# Patient Record
Sex: Female | Born: 1940 | Race: White | Hispanic: No | Smoking: Never smoker
Health system: Southern US, Community
[De-identification: ages and names within clinical notes are randomized; demographics above are authoritative.]

## PROBLEM LIST (undated history)

## (undated) DIAGNOSIS — M1990 Unspecified osteoarthritis, unspecified site: Secondary | ICD-10-CM

## (undated) DIAGNOSIS — D649 Anemia, unspecified: Secondary | ICD-10-CM

## (undated) DIAGNOSIS — I10 Essential (primary) hypertension: Secondary | ICD-10-CM

## (undated) HISTORY — PX: APPENDECTOMY: SHX54

## (undated) HISTORY — DX: Essential (primary) hypertension: I10

## (undated) HISTORY — PX: TONSILLECTOMY: SUR1361

## (undated) HISTORY — DX: Anemia, unspecified: D64.9

---

## 2007-01-02 ENCOUNTER — Ambulatory Visit: Payer: Self-pay | Admitting: Family Medicine

## 2008-09-15 ENCOUNTER — Ambulatory Visit: Payer: Self-pay | Admitting: Family Medicine

## 2008-09-28 ENCOUNTER — Ambulatory Visit: Payer: Self-pay | Admitting: Family Medicine

## 2012-08-20 HISTORY — PX: JOINT REPLACEMENT: SHX530

## 2013-06-17 ENCOUNTER — Ambulatory Visit: Payer: Self-pay | Admitting: General Practice

## 2013-06-17 LAB — BASIC METABOLIC PANEL
Anion Gap: 3 — ABNORMAL LOW (ref 7–16)
Calcium, Total: 9.2 mg/dL (ref 8.5–10.1)
Co2: 30 mmol/L (ref 21–32)
Creatinine: 0.85 mg/dL (ref 0.60–1.30)
EGFR (Non-African Amer.): >60
Glucose: 90 mg/dL (ref 65–99)
Potassium: 3.8 mmol/L (ref 3.5–5.1)
Sodium: 137 mmol/L (ref 136–145)

## 2013-06-17 LAB — URINALYSIS, COMPLETE
Blood: NEGATIVE
Glucose,UR: NEGATIVE mg/dL (ref 0–75)
Nitrite: NEGATIVE
Protein: NEGATIVE
Specific Gravity: 1.005 (ref 1.003–1.030)
Squamous Epithelial: <1
WBC UR: 12 /HPF (ref 0–5)

## 2013-06-17 LAB — PROTIME-INR
INR: 0.9
Prothrombin Time: 12.5 secs (ref 11.5–14.7)

## 2013-06-17 LAB — CBC
HCT: 39.5 % (ref 35.0–47.0)
MCV: 93 fL (ref 80–100)
Platelet: 209 10*3/uL (ref 150–440)
RBC: 4.25 10*6/uL (ref 3.80–5.20)
RDW: 14.5 % (ref 11.5–14.5)

## 2013-06-17 LAB — MRSA PCR SCREENING

## 2013-06-17 LAB — SEDIMENTATION RATE: Erythrocyte Sed Rate: 8 mm/hr (ref 0–30)

## 2013-06-19 LAB — URINE CULTURE

## 2013-07-01 ENCOUNTER — Inpatient Hospital Stay: Payer: Self-pay | Admitting: General Practice

## 2013-07-02 LAB — BASIC METABOLIC PANEL
BUN: 11 mg/dL (ref 7–18)
EGFR (African American): >60
EGFR (Non-African Amer.): >60
Glucose: 128 mg/dL — ABNORMAL HIGH (ref 65–99)
Osmolality: 275 (ref 275–301)
Sodium: 137 mmol/L (ref 136–145)

## 2013-07-02 LAB — HEMOGLOBIN: HGB: 9.9 g/dL — ABNORMAL LOW (ref 12.0–16.0)

## 2013-07-02 LAB — PLATELET COUNT: Platelet: 183 10*3/uL (ref 150–440)

## 2013-07-03 LAB — BASIC METABOLIC PANEL
Anion Gap: 9 (ref 7–16)
Calcium, Total: 8.2 mg/dL — ABNORMAL LOW (ref 8.5–10.1)
Chloride: 104 mmol/L (ref 98–107)
Co2: 24 mmol/L (ref 21–32)
EGFR (African American): >60
Osmolality: 272 (ref 275–301)

## 2013-07-04 DIAGNOSIS — Z96649 Presence of unspecified artificial hip joint: Secondary | ICD-10-CM | POA: Insufficient documentation

## 2013-07-06 LAB — PATHOLOGY REPORT

## 2014-12-10 NOTE — Discharge Summary (Signed)
PATIENT NAME:  [name], [name] MR#:  [id number] DATE OF BIRTH:  12/13/1940  DATE OF ADMISSION:  07/01/2013 DATE OF DISCHARGE:  07/04/2013   ADMITTING DIAGNOSIS: Degenerative arthrosis of the right hip.   DISCHARGE DIAGNOSIS: Degenerative arthrosis of the right hip.   OPERATION: On 07/01/2013, the patient had a right total hip arthroplasty.   SURGEON: Illene Labrador. Angie Fava., MD  ASSISTANT: Van Clines, PA  ANESTHESIA: Spinal.   ESTIMATED BLOOD LOSS: 150 mL.   DRAINS: Two medium drains to Hemovac reservoir.   IMPLANTS USED: DePuy 13.5 mm large stature AML femoral component, 56 mm outer diameter Pinnacle Gription Sector acetabular shell, 6.5 mm x 20 mm Pinnacle cancellous bone screw, +4 mm 10 degree Pinnacle Marathon polyethylene liner, 36 mm outer diameter M-Spec femoral head with +5 mm neck length. The patient was stabilized, brought to the recovery room and then brought down to the orthopedic floor.   HISTORY: The patient is a 74 year old female who presented for an upcoming total hip replacement. The patient has a long history of refractory to conservative management for hip arthritis. The patient has tried anti-inflammatories and activity modification.   PHYSICAL EXAMINATION:  GENERAL: Alert female with an antalgic gait and abductor lurch.  LUNGS: Clear to auscultation.  CARDIOVASCULAR: Regular rate and rhythm.  MUSCULOSKELETAL: In regard to the right lower extremity, the patient has positive pelvic tilt. The patient has full extension to 90 degrees flexion with 10 degrees adduction and 20 degrees abduction. The patient has 30 degrees external rotation. The patient has pain with rotation and compression.   HOSPITAL COURSE: After initial admission on 07/01/2013, the patient was brought to the orthopedic floor. On postoperative day 1, the patient had a hemoglobin of 9.9, which went down to 9.5 on postoperative day 2. The patient remained stable there. The patient did physical therapy,  ambulating 75 feet and doing well. The patient was having some pain, but was tolerating therapy and is ready to go to rehab on 07/04/2013.   CONDITION AT DISCHARGE: Stable.   DISPOSITION: The patient was sent to rehab.   DISCHARGE INSTRUCTIONS:  1. The patient will follow up with [facility] on 08/11/2013 with Dr. Ernest Pine. 2. The patient is weight bear as tolerated.  3. The patient will elevate her leg with 1 to 2 pillows and use knee-high TED hose on both legs, to be removed 1 hour every 8-hour shift.  4. The patient will keep her heels off the bed and be encouraged to do cough and deep breathing.  5. The patient is to use regular diet.  6. The patient will not get her dressing dirty, try to keep it clean. The patient will have a dressing change as needed.  7. The patient will have her staples removed in 2 weeks in rehab.  8. The patient will call the clinic if there is any bright red bleeding, calf pain, bowel or bladder difficulty or have rehab do this.   DISCHARGE MEDICATIONS:  1. Tylenol 500 mg 1 tablet t.i.d.  2. Pravastatin 0.5 tablet daily.  3. Celebrex 200 mg 1 tablet daily.  4. Oxycodone 5 mg 1 tablet q.4 hours.  5. Tramadol 50 mg 1 tablet q.4 hours as needed for less severe pain.  6. Milk of Magnesia 30 mL 2 times a day.  7. Lovenox 40 mg subcutaneous once a day for 14 days.  8. Ranitidine 150 mg p.o. daily.  9. Bisacodyl 10 mg per rectally 1 suppository daily for constipation p.r.n.  10. Pantoprazole 40 mg 1 tablet b.i.d.    ____________________________ J. Dedra Skeens, [place] jtm:lb D: 07/03/2013 07:34:00 ET T: 07/03/2013 07:49:40 ET JOB#: [id number]  cc: J. Dedra Skeens, [place], <Dictator> J Shaketa Serafin [facility] PA ELECTRONICALLY SIGNED 07/05/2013 6:30

## 2014-12-10 NOTE — Op Note (Signed)
PATIENT NAME:  [name] MR#:  [id number] DATE OF BIRTH:  1940-08-25  DATE OF PROCEDURE:  07/01/2013   PREOPERATIVE DIAGNOSIS: Degenerative arthrosis of the right hip.   POSTOPERATIVE DIAGNOSIS: Degenerative arthrosis of the right knee.   PROCEDURE PERFORMED: Right total hip arthroplasty.   SURGEON: Francesco Sor, M.D.   ASSISTANT:  Van Clines, PA (required to maintain retraction throughout the procedure).   ANESTHESIA: Spinal.   ESTIMATED BLOOD LOSS: 150 mL.   FLUIDS REPLACED: 2100 mL of crystalloid.   DRAINS: Two medium drains to Hemovac reservoir.   IMPLANTS UTILIZED: DePuy 13.5 mm large stature AML femoral component, 56 mm outer diameter Pinnacle Gription Sector acetabular shell, a 6.5 mm x 20 mm Pinnacle cancellous bone screw, a +4 mm 10-degree Pinnacle Marathon polyethylene liner, and a 36 mm outer diameter M-Spec femoral head with a +5 mm neck length.   INDICATIONS FOR SURGERY: The patient is a 74 year old female who has been seen for complaints of progressive right hip and groin pain. X-rays demonstrated severe degenerative changes with full-thickness loss of articular cartilage. After discussion of the risks and benefits of the surgical intervention, the patient expressed understanding of the risks and benefits, and agreed with plans for surgical intervention.   PROCEDURE IN DETAIL: The patient was brought into the operating room and after adequate spinal anesthesia was achieved, the patient was placed in a left lateral decubitus position. Axillary roll was placed and all bony prominences were well padded. The patient's right hip and leg were cleaned and prepped with alcohol and DuraPrep, draped in the usual sterile fashion. A "timeout" was performed as per usual protocol. A lateral curvilinear incision was made gently curving towards the posterior superior iliac spine. IT band was incised in line with the skin incision, and the fibers of the gluteus maximus were split in line.  The piriformis tendon was identified, skeletonized, and incised at its insertion on the proximal femur and reflected posteriorly. In a similar fashion, the short external rotators were incised and reflected posteriorly. A T-type posterior capsulotomy was performed. Prior to dislocation of the femoral head, a threaded Steinmann pin was inserted through a separate stab incision into the pelvis superior to the acetabulum and then bent in the form of a stylus so as to assess limb length and hip offset throughout the procedure. The femoral head was then dislocated posteriorly. Inspection of the femoral head demonstrated severe degenerative changes with full-thickness loss of articular cartilage. Prominent osteophytes were debrided and synovial hypertrophy was also debrided from along the femoral neck. The femoral neck cut was performed using an oscillating saw. The anterior capsule was elevated off the femoral neck. Attention was then directed to the acetabulum. Severe degenerative changes were noted with slight defect noted to the posterior superior portion. The labrum was excised using electrocautery. The acetabulum was reamed in a sequential fashion up to a 55 mm diameter. This allowed for good punctate bleeding bone. A 56 mm outer diameter Pinnacle Gription Sector acetabular component was positioned and impacted in place. Good scratch fit was appreciated. It was elected to augment fixation with a cancellous screw. A 6.5 mm x 20 mm cancellous screw was inserted with good purchase noted. A +4 neutral polyethylene trial was placed and attention was then directed to the proximal femur. Pilot hole for reaming of the proximal femoral canal was prepared using a high-speed bur. Proximal femoral canal was reamed in a sequential fashion up to a 13 mm diameter. This allowed for approximately 5.5  to 6 cm of scratch fit. The proximal portion of the femur was then prepared using a 13.5 mm aggressive side-biting reamer. Serial  broaches were inserted up to a 13.5 mm large stature component. The calcar region was planed and trial reduction was performed using first a 1.5 and subsequently +5 mm neck length. Reasonably good stability was noted. The polyethylene trial was replaced with a +4 mm 10-degree lip with the high side located at approximately the 8 o'clock position. This allowed for additional posterior stability. Trial components were removed. The acetabular shell was irrigated and suctioned dry. A +4 mm 10-degree Pinnacle Marathon polyethylene liner was positioned and impacted into place. Next, a 13.5 mm large stature AML femoral component was positioned and impacted into place. Excellent scratch fit was appreciated. Trial reduction was again performed with a 36 mm hip ball with a +5 mm neck length. Again, excellent equalization of limb lengths and reproduction of hip offset was noted, as well as excellent stability both anteriorly and posteriorly. Trial hip ball was removed. The Morse taper was cleaned and dried. A 36 mm outer diameter M-Spec femoral head with a +5 mm neck length was placed on the trunnion and impacted into place. The hip was reduced and placed through a range of motion. Excellent stability was appreciated both anteriorly and posteriorly with good equalization of limb lengths and hip offset.   The hip was irrigated with copious amounts of normal saline with antibiotic solution using pulsatile lavage and then suctioned dry. Good hemostasis was appreciated. The posterior capsulotomy was repaired using #5 Ethibond. Piriformis tendon was reapproximated on the undersurface of the gluteus medius tendon using #5 Ethibond. Two medium drains were placed in the wound bed and brought out through a separate stab incision to be attached to a Hemovac reservoir. The IT band was repaired using interrupted sutures of #1 Vicryl. The subcutaneous tissue was approximated in layers using first #0 Vicryl, followed by 2-0 Vicryl. Skin  was closed with skin staples. A sterile dressing was applied.   The patient tolerated the procedure well. She was transported to the recovery room in stable condition.     ____________________________ Illene Labrador. Angie Fava., MD jph:dmm D: 07/01/2013 20:26:19 ET T: 07/01/2013 20:43:56 ET JOB#: [id number]  cc: Illene Labrador. Angie Fava., MD, <Dictator> Illene Labrador Angie Fava MD ELECTRONICALLY SIGNED 07/03/2013 15:25

## 2015-05-03 DIAGNOSIS — E785 Hyperlipidemia, unspecified: Secondary | ICD-10-CM | POA: Insufficient documentation

## 2015-05-15 IMAGING — CR RIGHT HIP - COMPLETE 2+ VIEW
1 series · 2 of 2 positions shown · non-contrast
Comparison: None.

CLINICAL DATA: Status post right hip replacement

EXAM:
RIGHT HIP - COMPLETE 2+ VIEW

[Series 1: ap · 0.17mm/px · 2 of 2 slices shown]
[im 1/2]
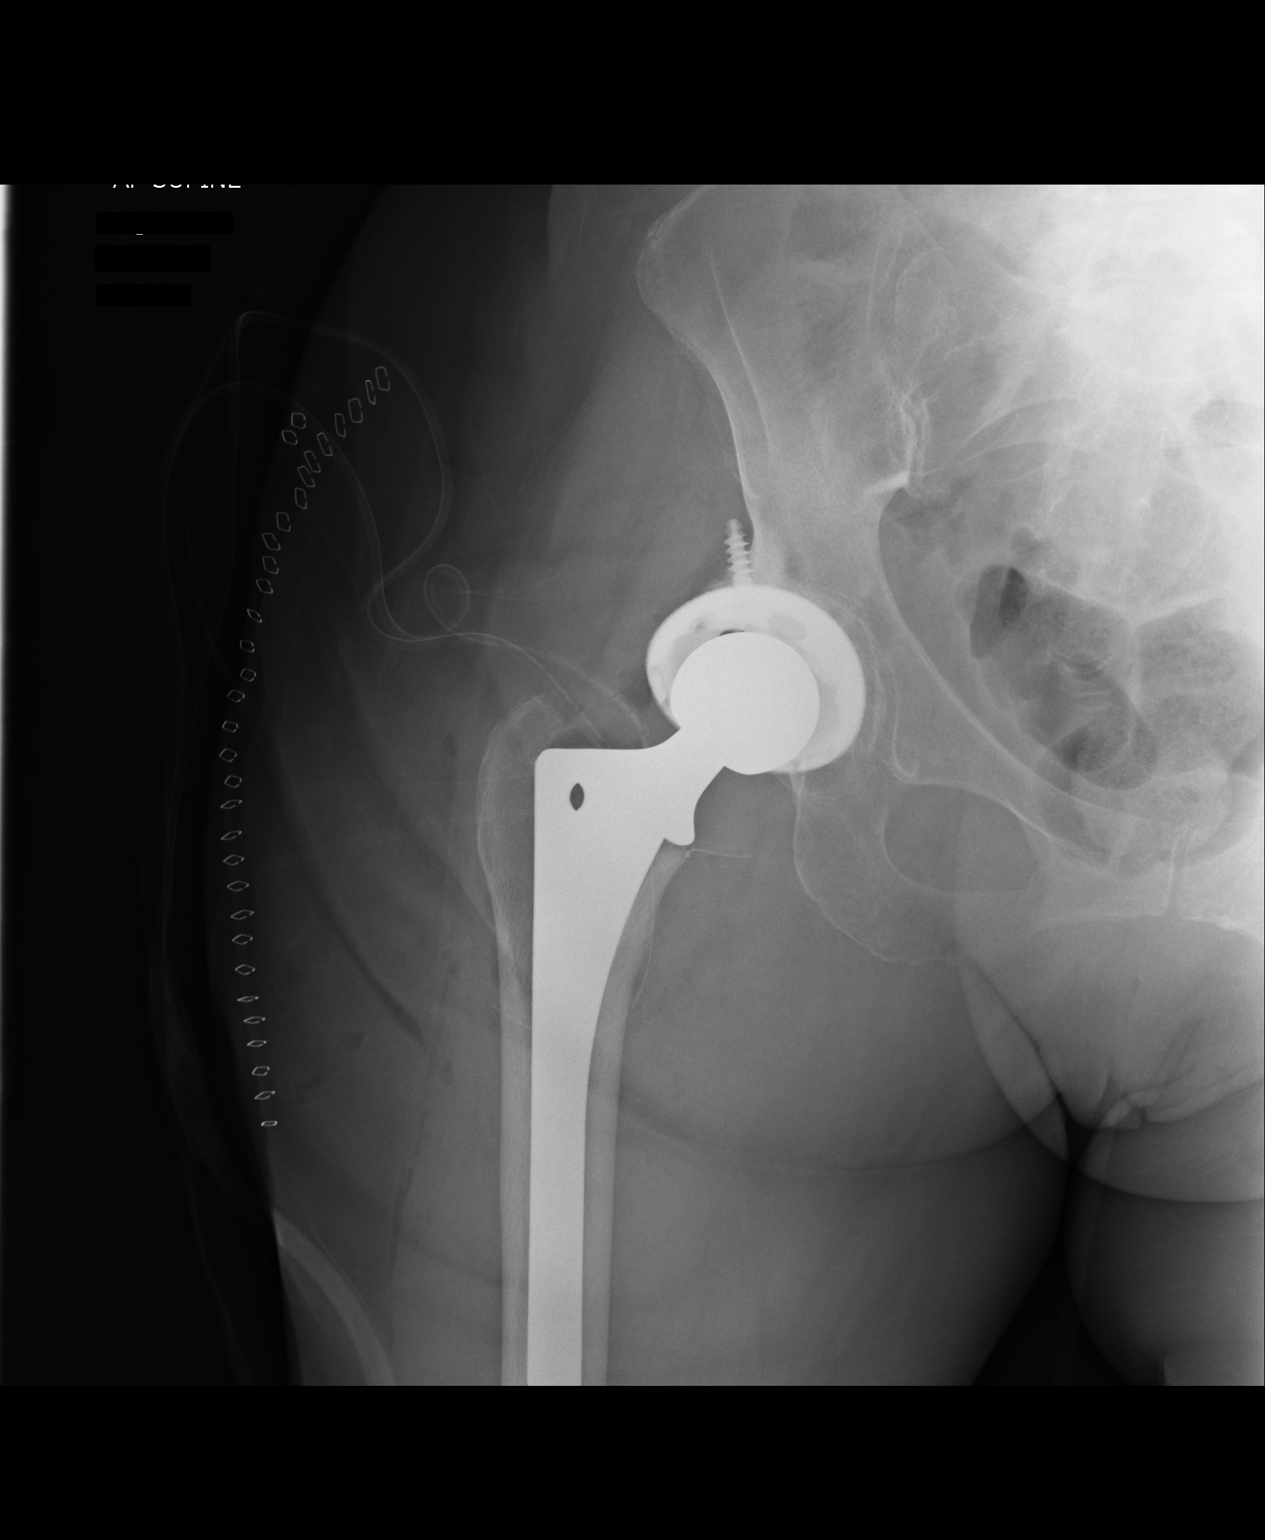
[im 2/2]
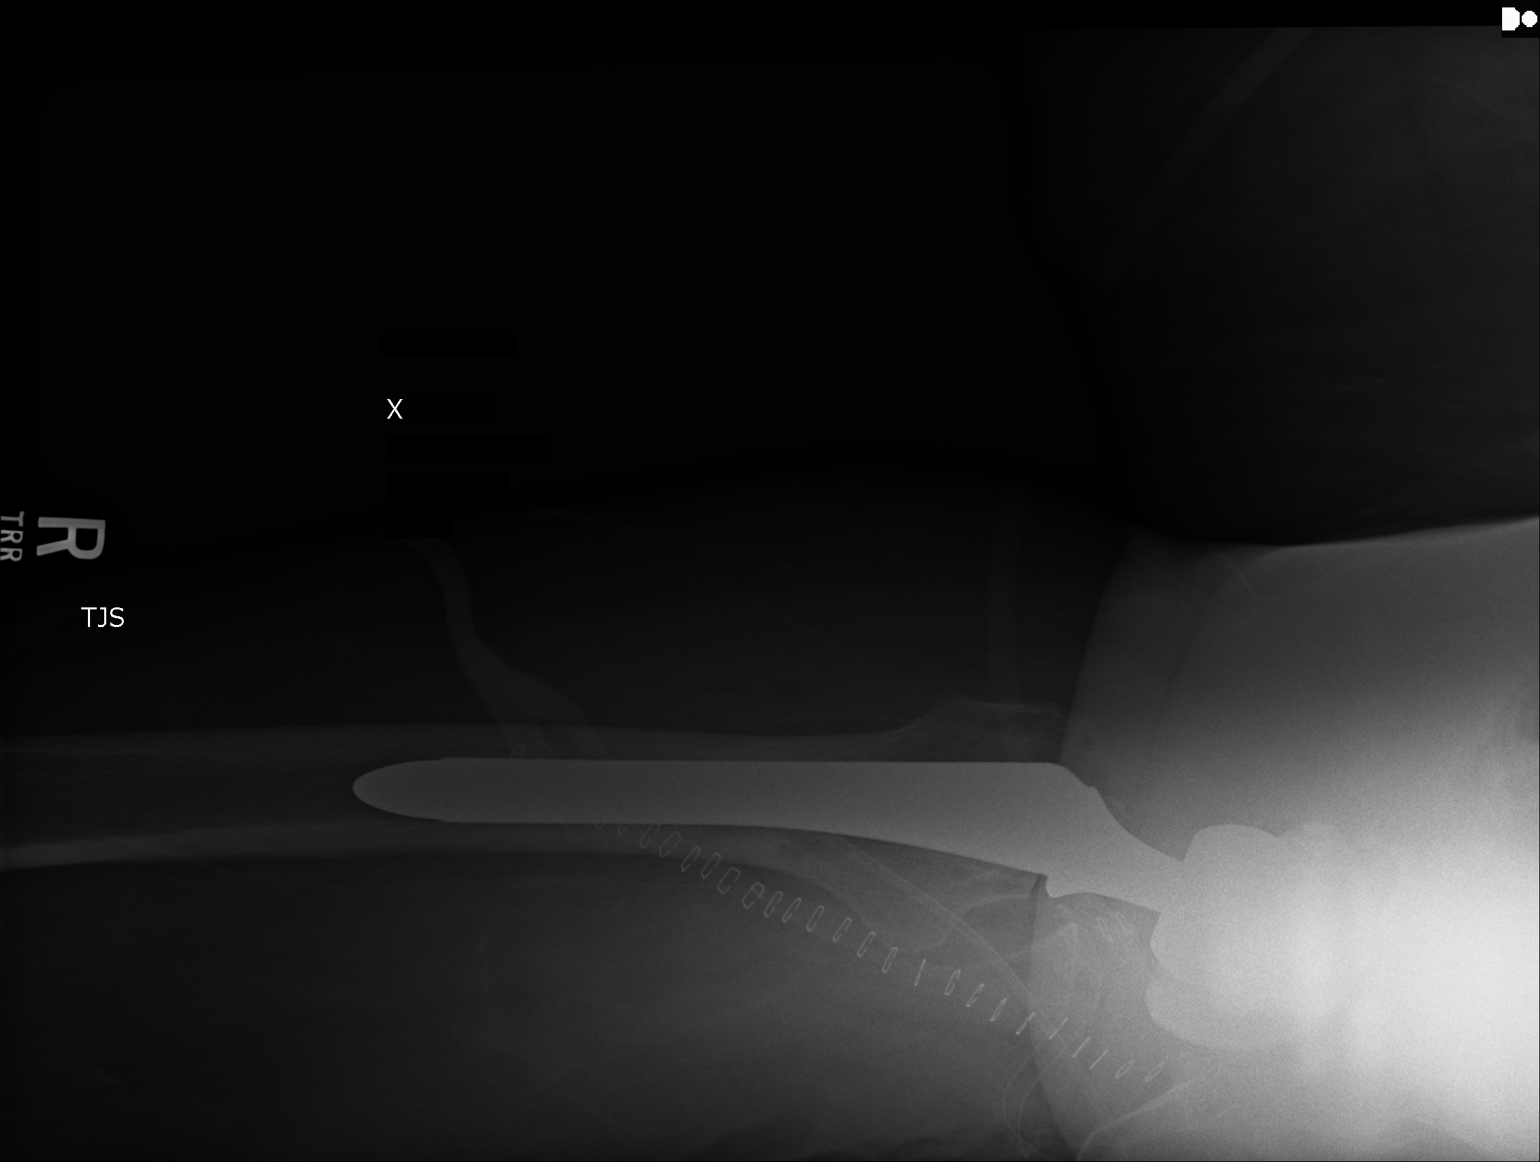

[2 of 2 positions shown; findings below may reference images not displayed]

FINDINGS: A right hip prosthesis is now seen. A surgical drain is noted in
place. The fixation screw to the acetabular component appears to
course somewhat laterally to the iliac bone. Clinical correlation is
recommended. No other focal abnormality is seen.

## 2015-06-13 DIAGNOSIS — M7062 Trochanteric bursitis, left hip: Secondary | ICD-10-CM | POA: Insufficient documentation

## 2015-08-18 ENCOUNTER — Encounter
Admission: RE | Admit: 2015-08-18 | Discharge: 2015-08-18 | Disposition: A | Discharge status: Home / Self Care | Payer: Commercial Managed Care - HMO | Source: Ambulatory Visit | Attending: Orthopedic Surgery | Admitting: Orthopedic Surgery

## 2015-08-18 DIAGNOSIS — Z01812 Encounter for preprocedural laboratory examination: Secondary | ICD-10-CM | POA: Diagnosis not present

## 2015-08-18 DIAGNOSIS — Z01810 Encounter for preprocedural cardiovascular examination: Secondary | ICD-10-CM | POA: Diagnosis present

## 2015-08-18 HISTORY — DX: Unspecified osteoarthritis, unspecified site: M19.90

## 2015-08-18 LAB — BASIC METABOLIC PANEL
Anion gap: 10 (ref 5–15)
BUN: 11 mg/dL (ref 6–20)
CHLORIDE: 101 mmol/L (ref 101–111)
CO2: 26 mmol/L (ref 22–32)
CREATININE: 0.61 mg/dL (ref 0.44–1.00)
Calcium: 9.1 mg/dL (ref 8.9–10.3)
GFR calc Af Amer: >60 mL/min (ref >60)
GFR calc non Af Amer: >60 mL/min (ref >60)
Glucose, Bld: 96 mg/dL (ref 65–99)
Potassium: 4.1 mmol/L (ref 3.5–5.1)
SODIUM: 137 mmol/L (ref 135–145)

## 2015-08-18 LAB — PROTIME-INR
INR: 1.09
PROTHROMBIN TIME: 14.3 s (ref 11.4–15.0)

## 2015-08-18 LAB — URINALYSIS COMPLETE WITH MICROSCOPIC (ARMC ONLY)
BILIRUBIN URINE: NEGATIVE
Bacteria, UA: NONE SEEN
Glucose, UA: NEGATIVE mg/dL
HGB URINE DIPSTICK: NEGATIVE
LEUKOCYTES UA: NEGATIVE
NITRITE: NEGATIVE
PH: 5 (ref 5.0–8.0)
Protein, ur: NEGATIVE mg/dL
Specific Gravity, Urine: 1.013 (ref 1.005–1.030)

## 2015-08-18 LAB — CBC
HCT: 32.3 % — ABNORMAL LOW (ref 35.0–47.0)
HEMOGLOBIN: 10.3 g/dL — AB (ref 12.0–16.0)
MCH: 23.5 pg — AB (ref 26.0–34.0)
MCHC: 31.8 g/dL — AB (ref 32.0–36.0)
MCV: 74.1 fL — ABNORMAL LOW (ref 80.0–100.0)
Platelets: 437 10*3/uL (ref 150–440)
RBC: 4.36 MIL/uL (ref 3.80–5.20)
RDW: 17 % — ABNORMAL HIGH (ref 11.5–14.5)
WBC: 6.8 10*3/uL (ref 3.6–11.0)

## 2015-08-18 LAB — SURGICAL PCR SCREEN
MRSA, PCR: NEGATIVE
STAPHYLOCOCCUS AUREUS: NEGATIVE

## 2015-08-18 LAB — APTT: aPTT: 32 seconds (ref 24–36)

## 2015-08-18 LAB — TYPE AND SCREEN
ABO/RH(D): O POS
ANTIBODY SCREEN: NEGATIVE

## 2015-08-18 LAB — ABO/RH: ABO/RH(D): O POS

## 2015-08-18 LAB — SEDIMENTATION RATE: Sed Rate: 89 mm/hr — ABNORMAL HIGH (ref 0–30)

## 2015-08-18 NOTE — Patient Instructions (Addendum)
  Your procedure is scheduled on: Wednesday 08/31/2015 Report to Day Surgery. 2ND FLOOR MEDICAL MALL ENTRANCE To find out your arrival time please call [phone] between 1PM - 3PM on Tuesday 08/30/2015.  Remember: Instructions that are not followed completely may result in serious medical risk, up to and including death, or upon the discretion of your surgeon and anesthesiologist your surgery may need to be rescheduled.    __X__ 1. Do not eat food or drink liquids after midnight. No gum chewing or hard candies.     __X__ 2. No Alcohol for 24 hours before or after surgery.   ____ 3. Bring all medications with you on the day of surgery if instructed.    __X__ 4. Notify your doctor if there is any change in your medical condition     (cold, fever, infections).     Do not wear jewelry, make-up, hairpins, clips or nail polish.  Do not wear lotions, powders, or perfumes.  Do not shave 48 hours prior to surgery. Men may shave face and neck.  Do not bring valuables to the hospital.    [facility] is not responsible for any belongings or valuables.               Contacts, dentures or bridgework may not be worn into surgery.  Leave your suitcase in the car. After surgery it may be brought to your room.  For patients admitted to the hospital, discharge time is determined by your                treatment team.   Patients discharged the day of surgery will not be allowed to drive home.   Please read over the following fact sheets that you were given:   MRSA Information and Surgical Site Infection Prevention   __X__ Take these medicines the morning of surgery with A SIP OF WATER:    1. NONE  2.   3.   4.  5.  6.  ____ Fleet Enema (as directed)   __X__ Use CHG Soap as directed  ____ Use inhalers on the day of surgery  ____ Stop metformin 2 days prior to surgery    ____ Take 1/2 of usual insulin dose the night before surgery and none on the morning of surgery.   __X__ Stop  Coumadin/Plavix/aspirin on 08/21/2015  ____ Stop Anti-inflammatories on    ____ Stop supplements until after surgery.    ____ Bring C-Pap to the hospital.

## 2015-08-19 LAB — URINE CULTURE

## 2015-08-31 ENCOUNTER — Inpatient Hospital Stay: Payer: PPO

## 2015-08-31 ENCOUNTER — Inpatient Hospital Stay
Admission: RE | Admit: 2015-08-31 | Discharge: 2015-09-03 | DRG: 470 | Disposition: A | Discharge status: Skilled Nursing Facility | Payer: PPO | Source: Ambulatory Visit | Attending: Orthopedic Surgery | Admitting: Orthopedic Surgery

## 2015-08-31 ENCOUNTER — Inpatient Hospital Stay: Payer: PPO | Admitting: Anesthesiology

## 2015-08-31 ENCOUNTER — Encounter
Admission: RE | Disposition: A | Discharge status: Skilled Nursing Facility | Payer: Self-pay | Source: Ambulatory Visit | Attending: Orthopedic Surgery

## 2015-08-31 ENCOUNTER — Encounter: Payer: Self-pay | Admitting: Orthopedic Surgery

## 2015-08-31 DIAGNOSIS — M810 Age-related osteoporosis without current pathological fracture: Secondary | ICD-10-CM | POA: Diagnosis present

## 2015-08-31 DIAGNOSIS — Z96642 Presence of left artificial hip joint: Secondary | ICD-10-CM | POA: Diagnosis not present

## 2015-08-31 DIAGNOSIS — Z7982 Long term (current) use of aspirin: Secondary | ICD-10-CM

## 2015-08-31 DIAGNOSIS — E785 Hyperlipidemia, unspecified: Secondary | ICD-10-CM | POA: Diagnosis not present

## 2015-08-31 DIAGNOSIS — Z96649 Presence of unspecified artificial hip joint: Secondary | ICD-10-CM

## 2015-08-31 DIAGNOSIS — M1612 Unilateral primary osteoarthritis, left hip: Principal | ICD-10-CM | POA: Diagnosis present

## 2015-08-31 DIAGNOSIS — Z79899 Other long term (current) drug therapy: Secondary | ICD-10-CM

## 2015-08-31 DIAGNOSIS — Z96641 Presence of right artificial hip joint: Secondary | ICD-10-CM

## 2015-08-31 DIAGNOSIS — Z471 Aftercare following joint replacement surgery: Secondary | ICD-10-CM | POA: Diagnosis not present

## 2015-08-31 HISTORY — PX: TOTAL HIP ARTHROPLASTY: SHX124

## 2015-08-31 SURGERY — ARTHROPLASTY, HIP, TOTAL,POSTERIOR APPROACH
Anesthesia: Spinal | Laterality: Left

## 2015-08-31 MED ORDER — ONDANSETRON HCL 4 MG/2ML IJ SOLN: 4.0000 mg | Freq: Once | INTRAMUSCULAR | Status: DC | PRN

## 2015-08-31 MED ORDER — CEFAZOLIN SODIUM-DEXTROSE 2-3 GM-% IV SOLR
2.0000 g | Freq: Four times a day (QID) | INTRAVENOUS | Status: AC
  Administered 2015-08-31 – 2015-09-01 (×4): 2 g via INTRAVENOUS
  Filled 2015-08-31 (×5): qty 50

## 2015-08-31 MED ORDER — MORPHINE SULFATE (PF) 2 MG/ML IV SOLN
2.0000 mg | INTRAVENOUS | Status: DC | PRN
  Administered 2015-08-31: 2 mg via INTRAVENOUS
  Filled 2015-08-31: qty 1

## 2015-08-31 MED ORDER — CEFAZOLIN SODIUM-DEXTROSE 2-3 GM-% IV SOLR
2.0000 g | Freq: Once | INTRAVENOUS | Status: AC
  Administered 2015-08-31: 2 g via INTRAVENOUS

## 2015-08-31 MED ORDER — FAMOTIDINE 20 MG PO TABS
20.0000 mg | ORAL_TABLET | Freq: Once | ORAL | Status: AC
  Administered 2015-08-31: 20 mg via ORAL

## 2015-08-31 MED ORDER — PROPOFOL 500 MG/50ML IV EMUL
INTRAVENOUS | Status: DC | PRN
  Administered 2015-08-31: 50 ug/kg/min via INTRAVENOUS

## 2015-08-31 MED ORDER — ALUM & MAG HYDROXIDE-SIMETH 200-200-20 MG/5ML PO SUSP: 30.0000 mL | ORAL | Status: DC | PRN

## 2015-08-31 MED ORDER — ACETAMINOPHEN 10 MG/ML IV SOLN
INTRAVENOUS | Status: DC | PRN
  Administered 2015-08-31: 1000 mg via INTRAVENOUS

## 2015-08-31 MED ORDER — TRAMADOL HCL 50 MG PO TABS: 50.0000 mg | ORAL_TABLET | ORAL | Status: DC | PRN

## 2015-08-31 MED ORDER — TETRACAINE HCL 1 % IJ SOLN
INTRAMUSCULAR | Status: DC | PRN
  Administered 2015-08-31: .7 mg via INTRASPINAL

## 2015-08-31 MED ORDER — LACTATED RINGERS IV SOLN
INTRAVENOUS | Status: DC
  Administered 2015-08-31: 75 mL/h via INTRAVENOUS

## 2015-08-31 MED ORDER — ACETAMINOPHEN 10 MG/ML IV SOLN
INTRAVENOUS | Status: AC
  Filled 2015-08-31: qty 100

## 2015-08-31 MED ORDER — OXYCODONE HCL 5 MG PO TABS
5.0000 mg | ORAL_TABLET | ORAL | Status: DC | PRN
  Administered 2015-08-31 – 2015-09-01 (×4): 5 mg via ORAL
  Filled 2015-08-31 (×4): qty 1

## 2015-08-31 MED ORDER — FERROUS SULFATE 325 (65 FE) MG PO TABS
325.0000 mg | ORAL_TABLET | Freq: Two times a day (BID) | ORAL | Status: DC
  Administered 2015-08-31 – 2015-09-03 (×6): 325 mg via ORAL
  Filled 2015-08-31 (×6): qty 1

## 2015-08-31 MED ORDER — FLEET ENEMA 7-19 GM/118ML RE ENEM: 1.0000 | ENEMA | Freq: Once | RECTAL | Status: DC | PRN

## 2015-08-31 MED ORDER — BISACODYL 10 MG RE SUPP: 10.0000 mg | Freq: Every day | RECTAL | Status: DC | PRN

## 2015-08-31 MED ORDER — ONDANSETRON HCL 4 MG/2ML IJ SOLN: 4.0000 mg | Freq: Four times a day (QID) | INTRAMUSCULAR | Status: DC | PRN

## 2015-08-31 MED ORDER — PHENYLEPHRINE HCL 10 MG/ML IJ SOLN
INTRAMUSCULAR | Status: DC | PRN
  Administered 2015-08-31 (×5): 50 ug via INTRAVENOUS

## 2015-08-31 MED ORDER — FENTANYL CITRATE (PF) 100 MCG/2ML IJ SOLN
25.0000 ug | INTRAMUSCULAR | Status: DC | PRN
  Administered 2015-08-31: 25 ug via INTRAVENOUS

## 2015-08-31 MED ORDER — NEOMYCIN-POLYMYXIN B GU 40-200000 IR SOLN
Status: AC
  Filled 2015-08-31: qty 20

## 2015-08-31 MED ORDER — CEFAZOLIN SODIUM-DEXTROSE 2-3 GM-% IV SOLR
INTRAVENOUS | Status: AC
  Filled 2015-08-31: qty 50

## 2015-08-31 MED ORDER — TETRACAINE HCL 1 % IJ SOLN
INTRAMUSCULAR | Status: AC
  Filled 2015-08-31: qty 2

## 2015-08-31 MED ORDER — NEOMYCIN-POLYMYXIN B GU 40-200000 IR SOLN
Status: DC | PRN
  Administered 2015-08-31: 14 mL

## 2015-08-31 MED ORDER — BUPIVACAINE IN DEXTROSE 0.75-8.25 % IT SOLN
INTRATHECAL | Status: DC | PRN
  Administered 2015-08-31: 1.6 mL via INTRATHECAL

## 2015-08-31 MED ORDER — SODIUM CHLORIDE 0.9 % IV SOLN
INTRAVENOUS | Status: DC
  Administered 2015-08-31 – 2015-09-01 (×2): via INTRAVENOUS

## 2015-08-31 MED ORDER — DIPHENHYDRAMINE HCL 12.5 MG/5ML PO ELIX: 12.5000 mg | ORAL_SOLUTION | ORAL | Status: DC | PRN

## 2015-08-31 MED ORDER — FENTANYL CITRATE (PF) 100 MCG/2ML IJ SOLN
INTRAMUSCULAR | Status: AC
  Administered 2015-08-31: 25 ug via INTRAVENOUS
  Filled 2015-08-31: qty 2

## 2015-08-31 MED ORDER — ACETAMINOPHEN 650 MG RE SUPP: 650.0000 mg | Freq: Four times a day (QID) | RECTAL | Status: DC | PRN

## 2015-08-31 MED ORDER — PHENOL 1.4 % MT LIQD: 1.0000 | OROMUCOSAL | Status: DC | PRN

## 2015-08-31 MED ORDER — MAGNESIUM HYDROXIDE 400 MG/5ML PO SUSP
30.0000 mL | Freq: Every day | ORAL | Status: DC | PRN
  Administered 2015-09-01 – 2015-09-02 (×2): 30 mL via ORAL
  Filled 2015-08-31 (×2): qty 30

## 2015-08-31 MED ORDER — EPHEDRINE SULFATE 50 MG/ML IJ SOLN
INTRAMUSCULAR | Status: DC | PRN
  Administered 2015-08-31 (×2): 5 mg via INTRAVENOUS

## 2015-08-31 MED ORDER — FENTANYL CITRATE (PF) 100 MCG/2ML IJ SOLN
INTRAMUSCULAR | Status: DC | PRN
  Administered 2015-08-31 (×2): 25 ug via INTRAVENOUS
  Administered 2015-08-31: 50 ug via INTRAVENOUS

## 2015-08-31 MED ORDER — MIDAZOLAM HCL 5 MG/5ML IJ SOLN
INTRAMUSCULAR | Status: DC | PRN
  Administered 2015-08-31 (×2): 1 mg via INTRAVENOUS

## 2015-08-31 MED ORDER — CELECOXIB 200 MG PO CAPS
200.0000 mg | ORAL_CAPSULE | Freq: Two times a day (BID) | ORAL | Status: DC
  Administered 2015-08-31 – 2015-09-03 (×6): 200 mg via ORAL
  Filled 2015-08-31 (×6): qty 1

## 2015-08-31 MED ORDER — ACETAMINOPHEN 325 MG PO TABS
650.0000 mg | ORAL_TABLET | Freq: Four times a day (QID) | ORAL | Status: DC | PRN
  Administered 2015-09-02 – 2015-09-03 (×2): 650 mg via ORAL
  Filled 2015-08-31 (×2): qty 2

## 2015-08-31 MED ORDER — MENTHOL 3 MG MT LOZG: 1.0000 | LOZENGE | OROMUCOSAL | Status: DC | PRN

## 2015-08-31 MED ORDER — ONDANSETRON HCL 4 MG PO TABS: 4.0000 mg | ORAL_TABLET | Freq: Four times a day (QID) | ORAL | Status: DC | PRN

## 2015-08-31 MED ORDER — FAMOTIDINE 20 MG PO TABS
ORAL_TABLET | ORAL | Status: AC
  Administered 2015-08-31: 20 mg via ORAL
  Filled 2015-08-31: qty 1

## 2015-08-31 MED ORDER — METOCLOPRAMIDE HCL 10 MG PO TABS
10.0000 mg | ORAL_TABLET | Freq: Three times a day (TID) | ORAL | Status: AC
  Administered 2015-08-31 – 2015-09-02 (×7): 10 mg via ORAL
  Filled 2015-08-31 (×7): qty 1

## 2015-08-31 MED ORDER — DIPHENHYDRAMINE HCL 50 MG/ML IJ SOLN
INTRAMUSCULAR | Status: DC | PRN
  Administered 2015-08-31: 12.5 mg via INTRAVENOUS

## 2015-08-31 MED ORDER — ACETAMINOPHEN 10 MG/ML IV SOLN
1000.0000 mg | Freq: Four times a day (QID) | INTRAVENOUS | Status: AC
  Administered 2015-09-01 (×3): 1000 mg via INTRAVENOUS
  Filled 2015-08-31 (×4): qty 100

## 2015-08-31 MED ORDER — SODIUM CHLORIDE 0.9 % IJ SOLN
INTRAMUSCULAR | Status: AC
  Filled 2015-08-31: qty 10

## 2015-08-31 MED ORDER — TRANEXAMIC ACID 1000 MG/10ML IV SOLN
1000.0000 mg | INTRAVENOUS | Status: AC
  Administered 2015-08-31: 1000 mg via INTRAVENOUS
  Filled 2015-08-31: qty 10

## 2015-08-31 MED ORDER — SENNOSIDES-DOCUSATE SODIUM 8.6-50 MG PO TABS
1.0000 | ORAL_TABLET | Freq: Two times a day (BID) | ORAL | Status: DC
  Administered 2015-08-31 – 2015-09-03 (×6): 1 via ORAL
  Filled 2015-08-31 (×6): qty 1

## 2015-08-31 MED ORDER — ENOXAPARIN SODIUM 30 MG/0.3ML ~~LOC~~ SOLN
30.0000 mg | Freq: Two times a day (BID) | SUBCUTANEOUS | Status: DC
  Administered 2015-09-01 – 2015-09-03 (×5): 30 mg via SUBCUTANEOUS
  Filled 2015-08-31 (×5): qty 0.3

## 2015-08-31 MED ORDER — TRANEXAMIC ACID 1000 MG/10ML IV SOLN
1000.0000 mg | Freq: Once | INTRAVENOUS | Status: AC
  Administered 2015-08-31: 1000 mg via INTRAVENOUS
  Filled 2015-08-31: qty 10

## 2015-08-31 SURGICAL SUPPLY — 59 items
BLADE DRUM FLTD (BLADE) ×3 IMPLANT
BLADE SAW 1 (BLADE) ×3 IMPLANT
CANISTER SUCT 1200ML W/VALVE (MISCELLANEOUS) ×3 IMPLANT
CANISTER SUCT 3000ML (MISCELLANEOUS) ×6 IMPLANT
CAPT HIP TOTAL 2 ×3 IMPLANT
CARTRIDGE OIL MAESTRO DRILL (MISCELLANEOUS) ×1 IMPLANT
CATH FOL LEG HOLDER (MISCELLANEOUS) ×3 IMPLANT
CATH TRAY METER 16FR LF (MISCELLANEOUS) ×3 IMPLANT
COVER BACK TABLE 60X90IN (DRAPES) ×3 IMPLANT
DIFFUSER MAESTRO (MISCELLANEOUS) ×3 IMPLANT
DRAPE INCISE IOBAN 66X60 STRL (DRAPES) ×3 IMPLANT
DRAPE SHEET LG 3/4 BI-LAMINATE (DRAPES) ×3 IMPLANT
DRAPE TABLE BACK 80X90 (DRAPES) ×3 IMPLANT
DRSG DERMACEA 8X12 NADH (GAUZE/BANDAGES/DRESSINGS) ×3 IMPLANT
DRSG OPSITE POSTOP 3X4 (GAUZE/BANDAGES/DRESSINGS) ×3 IMPLANT
DRSG OPSITE POSTOP 4X12 (GAUZE/BANDAGES/DRESSINGS) IMPLANT
DRSG OPSITE POSTOP 4X14 (GAUZE/BANDAGES/DRESSINGS) ×3 IMPLANT
DRSG TEGADERM 4X4.75 (GAUZE/BANDAGES/DRESSINGS) ×3 IMPLANT
DURAPREP 26ML APPLICATOR (WOUND CARE) ×6 IMPLANT
ELECT BLADE 6.5 EXT (BLADE) ×3 IMPLANT
ELECT CAUTERY BLADE 6.4 (BLADE) ×3 IMPLANT
GLOVE BIO SURGEON STRL SZ7 (GLOVE) ×6 IMPLANT
GLOVE BIOGEL M STRL SZ7.5 (GLOVE) ×9 IMPLANT
GLOVE INDICATOR 7.5 STRL GRN (GLOVE) ×6 IMPLANT
GLOVE INDICATOR 8.0 STRL GRN (GLOVE) ×3 IMPLANT
GLOVE SURG 9.0 ORTHO LTXF (GLOVE) ×3 IMPLANT
GLOVE SURG ORTHO 9.0 STRL STRW (GLOVE) ×3 IMPLANT
GOWN STRL REUS W/ TWL LRG LVL3 (GOWN DISPOSABLE) ×1 IMPLANT
GOWN STRL REUS W/ TWL LRG LVL4 (GOWN DISPOSABLE) ×1 IMPLANT
GOWN STRL REUS W/ TWL XL LVL3 (GOWN DISPOSABLE) ×1 IMPLANT
GOWN STRL REUS W/TWL 2XL LVL3 (GOWN DISPOSABLE) ×3 IMPLANT
GOWN STRL REUS W/TWL LRG LVL3 (GOWN DISPOSABLE) ×2
GOWN STRL REUS W/TWL LRG LVL4 (GOWN DISPOSABLE) ×2
GOWN STRL REUS W/TWL XL LVL3 (GOWN DISPOSABLE) ×2
HANDPIECE SUCTION TUBG SURGILV (MISCELLANEOUS) ×3 IMPLANT
HEMOVAC 400CC 10FR (MISCELLANEOUS) ×3 IMPLANT
HOOD PEEL AWAY FLYTE STAYCOOL (MISCELLANEOUS) ×6 IMPLANT
KIT RM TURNOVER STRD PROC AR (KITS) ×3 IMPLANT
NDL SAFETY 18GX1.5 (NEEDLE) ×3 IMPLANT
NS IRRIG 1000ML POUR BTL (IV SOLUTION) ×3 IMPLANT
OIL CARTRIDGE MAESTRO DRILL (MISCELLANEOUS) ×3
PACK HIP PROSTHESIS (MISCELLANEOUS) ×3 IMPLANT
PIN STEIN THRED 5/32 (Pin) ×3 IMPLANT
SOL .9 NS 3000ML IRR  AL (IV SOLUTION) ×2
SOL .9 NS 3000ML IRR UROMATIC (IV SOLUTION) ×1 IMPLANT
SOL PREP PVP 2OZ (MISCELLANEOUS) ×3
SOLUTION PREP PVP 2OZ (MISCELLANEOUS) ×1 IMPLANT
SPONGE DRAIN TRACH 4X4 STRL 2S (GAUZE/BANDAGES/DRESSINGS) ×3 IMPLANT
STAPLER SKIN PROX 35W (STAPLE) ×3 IMPLANT
SUT ETHIBOND #5 BRAIDED 30INL (SUTURE) ×3 IMPLANT
SUT VIC AB 0 CT1 36 (SUTURE) ×3 IMPLANT
SUT VIC AB 1 CT1 36 (SUTURE) ×6 IMPLANT
SUT VIC AB 2-0 CT1 27 (SUTURE) ×2
SUT VIC AB 2-0 CT1 TAPERPNT 27 (SUTURE) ×1 IMPLANT
SYR 20CC LL (SYRINGE) ×3 IMPLANT
TAPE ADH 3 LX (MISCELLANEOUS) ×3 IMPLANT
TAPE TRANSPORE STRL 2 31045 (GAUZE/BANDAGES/DRESSINGS) ×3 IMPLANT
WATER STERILE IRR 1000ML POUR (IV SOLUTION) IMPLANT
wire ×3 IMPLANT

## 2015-08-31 NOTE — Anesthesia Procedure Notes (Signed)
Spinal  Start time: 08/31/2015 11:20 AM End time: 08/31/2015 11:25 AM Reason for block: at surgeon's request Staffing Anesthesiologist: Elijio Miles F Performed by: anesthesiologist  Preanesthetic Checklist Completed: patient identified, site marked, surgical consent, pre-op evaluation, timeout performed, IV checked, risks and benefits discussed, monitors and equipment checked and at surgeon's request Spinal Block Patient position: sitting Prep: Betadine Patient monitoring: heart rate and blood pressure Approach: midline Location: L3-4 Injection technique: single-shot Needle Needle type: Quincke  Needle gauge: 25 G Needle length: 9 cm Needle insertion depth: 5 cm Assessment Sensory level: T8

## 2015-08-31 NOTE — H&P (Signed)
The patient has been re-examined, and the chart reviewed, and there have been no interval changes to the documented history and physical.    The risks, benefits, and alternatives have been discussed at length. The patient expressed understanding of the risks benefits and agreed with plans for surgical intervention.  James P. Hooten, Jr. M.D.    

## 2015-08-31 NOTE — Op Note (Signed)
OPERATIVE NOTE  DATE OF SURGERY:  08/31/2015  PATIENT NAME:  [name]   DOB: [birth year]  MRN: [id number]  PRE-OPERATIVE DIAGNOSIS: Degenerative arthrosis of the left hip, primary  POST-OPERATIVE DIAGNOSIS:  Same  PROCEDURE:  Left total hip arthroplasty  SURGEON:  Jena Gauss. M.D.  ASSISTANT:  Van Clines, PA (present and scrubbed throughout the case, critical for assistance with exposure, retraction, instrumentation, and closure)  ANESTHESIA: spinal  ESTIMATED BLOOD LOSS: 100 mL  FLUIDS REPLACED: 1400 mL of crystalloid  DRAINS: 2 medium drains to a Hemovac reservoir  IMPLANTS UTILIZED: DePuy 13.5 mm small stature AML femoral stem, 56 mm OD Pinnacle Gription Sector acetabular component, +4 mm neutral Pinnacle Marathon polyethylene insert, and a 36 mm M-SPEC +1.5 mm hip ball; 1.2 mm cerclage wire  INDICATIONS FOR SURGERY: [name] is a 75 y.o. year old female with a long history of progressive hip and groin  pain. X-rays demonstrated severe degenerative changes. The patient had not seen any significant improvement despite conservative nonsurgical intervention. After discussion of the risks and benefits of surgical intervention, the patient expressed understanding of the risks benefits and agree with plans for total hip arthroplasty.   The risks, benefits, and alternatives were discussed at length including but not limited to the risks of infection, bleeding, nerve injury, stiffness, blood clots, the need for revision surgery, limb length inequality, dislocation, cardiopulmonary complications, among others, and they were willing to proceed.  PROCEDURE IN DETAIL: The patient was brought into the operating room and, after adequate spinal anesthesia was achieved, the patient was placed in a right lateral decubitus position. Axillary roll was placed and all bony prominences were well-padded. The patient's left hip was cleaned and prepped with alcohol and DuraPrep and draped in  the usual sterile fashion. A "timeout" was performed as per usual protocol. A lateral curvilinear incision was made gently curving towards the posterior superior iliac spine. The IT band was incised in line with the skin incision and the fibers of the gluteus maximus were split in line. The piriformis tendon was identified, skeletonized, and incised at its insertion to the proximal femur and reflected posteriorly. A T type posterior capsulotomy was performed. Prior to dislocation of the femoral head, a threaded Steinmann pin was inserted through a separate stab incision into the pelvis superior to the acetabulum and bent in the form of a stylus so as to assess limb length and hip offset throughout the procedure. The femoral head was then dislocated posteriorly. Inspection of the femoral head demonstrated severe degenerative changes with full-thickness loss of articular cartilage. The femoral neck cut was performed using an oscillating saw. The anterior capsule was elevated off of the femoral neck using a periosteal elevator. Attention was then directed to the acetabulum. The remnant of the labrum was excised using electrocautery. Inspection of the acetabulum also demonstrated significant degenerative changes. The acetabulum was reamed in sequential fashion up to a 55 mm diameter. Good punctate bleeding bone was encountered. A 56 mm Pinnacle Gription Sector acetabular component was positioned and impacted into place. Good scratch fit was appreciated. A +4 mm neutral polyethylene trial was inserted.  Attention was then directed to the proximal femur. A hole for reaming of the proximal femoral canal was created using a high-speed burr. The femoral canal was reamed in sequential fashion up to a 13.5 mm diameter. This allowed for approximately 7 cm of scratch fit. Serial broaches were inserted up to a 13.5 mm small stature femoral broach.  Calcar region was planed and a trial reduction was performed using a 36 mm hip  ball with a +1.5 mm neck length. Good equalization of limb lengths and hip offset was appreciated and excellent stability was noted both anteriorly and posteriorly. Trial components were removed. The acetabular shell was irrigated with copious amounts of normal saline with antibiotic solution and suctioned dry. A +4 mm neutral Pinnacle Marathon polyethylene insert was positioned and impacted into place. Next, a 13.5 mm small stature AML femoral stem was positioned and impacted into place. Excellent scratch fit was appreciated. A small fracture in the calcar region was noted without significant provocation distally. It was elected to resect protect the site with a 1.5 mm large wire. This was placed and tightened without difficulty. There was no apparent instability to the stem. A trial reduction was again performed with a 36 mm hip ball with a +1.5 mm neck length. Again, good equalization of limb lengths was appreciated and excellent stability appreciated both anteriorly and posteriorly. The hip was then dislocated and the trial hip ball was removed. The Morse taper was cleaned and dried. A 36 mm M-SPEC hip ball with a +1.5 mm neck length was placed on the trunnion and impacted into place. The hip was then reduced and placed through range of motion. Excellent stability was appreciated both anteriorly and posteriorly.  The wound was irrigated with copious amounts of normal saline with antibiotic solution and suctioned dry. Good hemostasis was appreciated. The posterior capsulotomy was repaired using #5 Ethibond. Piriformis tendon was reapproximated to the undersurface of the gluteus medius tendon using #5 Ethibond. Two medium drains were placed in the wound bed and brought out through separate stab incisions to be attached to a Hemovac reservoir. The IT band was reapproximated using interrupted sutures of #1 Vicryl. Subcutaneous tissue was approximated using first #0 Vicryl followed by #2-0 Vicryl. The skin was  closed with skin staples.  The patient tolerated the procedure well and was transported to the recovery room in stable condition.   Jena Gauss., M.D.

## 2015-08-31 NOTE — Addendum Note (Signed)
Addendum  created 08/31/15 1556 by Michaele Offer, CRNA   Modules edited: Anesthesia Medication Administration

## 2015-08-31 NOTE — Anesthesia Preprocedure Evaluation (Signed)
Anesthesia Evaluation  Patient identified by MRN, date of birth, ID band Patient awake    Reviewed: Allergy & Precautions, NPO status , Patient's Chart, lab work & pertinent test results  Airway Mallampati: III       Dental  (+) Teeth Intact   Pulmonary neg pulmonary ROS,    breath sounds clear to auscultation       Cardiovascular Exercise Tolerance: Good  Rhythm:Regular Rate:Normal     Neuro/Psych    GI/Hepatic negative GI ROS, Neg liver ROS,   Endo/Other  negative endocrine ROS  Renal/GU negative Renal ROS     Musculoskeletal   Abdominal Normal abdominal exam  (+)   Peds negative pediatric ROS (+)  Hematology negative hematology ROS (+)   Anesthesia Other Findings   Reproductive/Obstetrics                             Anesthesia Physical Anesthesia Plan  ASA: II  Anesthesia Plan: Spinal   Post-op Pain Management:    Induction: Intravenous  Airway Management Planned: Nasal Cannula  Additional Equipment:   Intra-op Plan:   Post-operative Plan:   Informed Consent: I have reviewed the patients History and Physical, chart, labs and discussed the procedure including the risks, benefits and alternatives for the proposed anesthesia with the patient or authorized representative who has indicated his/her understanding and acceptance.     Plan Discussed with: CRNA  Anesthesia Plan Comments:         Anesthesia Quick Evaluation

## 2015-08-31 NOTE — Progress Notes (Signed)
Pt admitted to floor, room 148. Sister at bedside. Pt has cash and jewelry in glasses case at bedside, does not wish to transfer items to safe or send home with family. Advised pt of valuables policy.

## 2015-08-31 NOTE — Anesthesia Postprocedure Evaluation (Signed)
Anesthesia Post Note  Patient: [name]  Procedure(s) Performed: Procedure(s) (LRB): TOTAL HIP ARTHROPLASTY (Left)  Patient location during evaluation: PACU Anesthesia Type: Spinal Level of consciousness: awake Pain management: pain level controlled Vital Signs Assessment: post-procedure vital signs reviewed and stable Respiratory status: spontaneous breathing Cardiovascular status: stable Anesthetic complications: no    Last Vitals:  Filed Vitals:   08/31/15 1521 08/31/15 1525  BP: 126/64   Pulse: 79 71  Temp:    Resp: 16 13    Last Pain:  Filed Vitals:   08/31/15 1529  PainSc: 3                  VAN STAVEREN,Daniel Johndrow

## 2015-08-31 NOTE — Brief Op Note (Signed)
08/31/2015  2:55 PM  PATIENT:  [name]  75 y.o. female  PRE-OPERATIVE DIAGNOSIS:  DEGENERATIVE arthrosis left hip  POST-OPERATIVE DIAGNOSIS:  degenerative arthrosis left hip  PROCEDURE:  Procedure(s): TOTAL HIP ARTHROPLASTY (Left)  SURGEON:  Surgeon(s) and Role:    * Donato Heinz, MD - Primary  ASSISTANTS: Van Clines, PA   ANESTHESIA:   spinal  EBL:  Total I/O In: 1600 [I.V.:1600] Out: 300 [Urine:200; Blood:100]  BLOOD ADMINISTERED:none  DRAINS: 2 medium Hemovac drains   LOCAL MEDICATIONS USED:  NONE  SPECIMEN:  Source of Specimen:  Left femoral head  DISPOSITION OF SPECIMEN:  PATHOLOGY  COUNTS:  YES  TOURNIQUET:  * No tourniquets in log *  DICTATION: .Dragon Dictation  PLAN OF CARE: Admit to inpatient   PATIENT DISPOSITION:  PACU - hemodynamically stable.   Delay start of Pharmacological VTE agent (>24hrs) due to surgical blood loss or risk of bleeding: yes

## 2015-08-31 NOTE — Transfer of Care (Signed)
Immediate Anesthesia Transfer of Care Note  Patient: [name]  Procedure(s) Performed: Procedure(s): TOTAL HIP ARTHROPLASTY (Left)  Patient Location: PACU  Anesthesia Type:Spinal  Level of Consciousness: awake, alert  and oriented  Airway & Oxygen Therapy: Patient Spontanous Breathing  Post-op Assessment: Report given to RN and Post -op Vital signs reviewed and stable  Post vital signs: Reviewed and stable  Last Vitals:  Filed Vitals:   08/31/15 0933  BP: 153/61  Pulse: 97  Temp: 36.9 C  Resp: 14    Complications: No apparent anesthesia complications

## 2015-09-01 ENCOUNTER — Encounter: Payer: Self-pay | Admitting: Orthopedic Surgery

## 2015-09-01 LAB — BASIC METABOLIC PANEL
ANION GAP: 5 (ref 5–15)
BUN: 7 mg/dL (ref 6–20)
CO2: 27 mmol/L (ref 22–32)
Calcium: 8.2 mg/dL — ABNORMAL LOW (ref 8.9–10.3)
Chloride: 105 mmol/L (ref 101–111)
Creatinine, Ser: 0.59 mg/dL (ref 0.44–1.00)
GFR calc Af Amer: >60 mL/min (ref >60)
Glucose, Bld: 92 mg/dL (ref 65–99)
POTASSIUM: 3.8 mmol/L (ref 3.5–5.1)
SODIUM: 137 mmol/L (ref 135–145)

## 2015-09-01 LAB — CBC
HEMATOCRIT: 27.8 % — AB (ref 35.0–47.0)
Hemoglobin: 8.7 g/dL — ABNORMAL LOW (ref 12.0–16.0)
MCH: 23.2 pg — ABNORMAL LOW (ref 26.0–34.0)
MCHC: 31.2 g/dL — ABNORMAL LOW (ref 32.0–36.0)
MCV: 74.3 fL — ABNORMAL LOW (ref 80.0–100.0)
Platelets: 345 10*3/uL (ref 150–440)
RBC: 3.74 MIL/uL — ABNORMAL LOW (ref 3.80–5.20)
RDW: 17 % — AB (ref 11.5–14.5)
WBC: 5.9 10*3/uL (ref 3.6–11.0)

## 2015-09-01 NOTE — Progress Notes (Signed)
Physical Therapy Treatment Patient Details Name: LYLITH BEBEAU MRN: [id number] DOB: 1941-06-30 Today's Date: 09/01/2015    History of Present Illness admitted for acute hospitalization status post L THA, WBAT, posterior hip precautions (08/31/15).    PT Comments    Noted improvement in pain control this date, allowing patient to tolerate increased stance time/WBing L LE with all functional activities.  Demonstrates limited heel strike/toe off L LE with some degree of pelvic rotation towards L throughout gait cycle (though much greater weight acceptance L LE); responds well to verbal cuing, but unable to maintain without instruction.   Follow Up Recommendations  SNF     Equipment Recommendations       Recommendations for Other Services       Precautions / Restrictions Precautions Precautions: Fall;Posterior Hip Restrictions Weight Bearing Restrictions: Yes LLE Weight Bearing: Weight bearing as tolerated    Mobility  Bed Mobility Overal bed mobility: Needs Assistance Bed Mobility: Supine to Sit;Sit to Supine     Supine to sit: Min assist     General bed mobility comments: assist for R LE management/position with movement transition  Transfers Overall transfer level: Needs assistance Equipment used: Rolling walker (2 wheeled) Transfers: Sit to/from Stand Sit to Stand: Min guard         General transfer comment: cuing for progression towards symmetrical WBing bilat LEs, to prevent excessive IR L LE with movement transition  Ambulation/Gait Ambulation/Gait assistance: Min guard;Min assist Ambulation Distance (Feet): 110 Feet Assistive device: Rolling walker (2 wheeled)       General Gait Details: step to progressing to partial step through gait pattern; improving WBing and stance time L LE without buckling or LOb.  Maintains L LE in somewhat guarded position with limited heel strike/toe off, excessive pelvic rotation towards L at times.  Responds well to verbal  cuing for gait mechanics, but unable to maintain without instruction.   Stairs            Wheelchair Mobility    Modified Rankin (Stroke Patients Only)       Balance Overall balance assessment: Needs assistance Sitting-balance support: No upper extremity supported;Feet supported Sitting balance-Leahy Scale: Good     Standing balance support: Bilateral upper extremity supported Standing balance-Leahy Scale: Fair                      Cognition Arousal/Alertness: Awake/alert Behavior During Therapy: WFL for tasks assessed/performed Overall Cognitive Status: Within Functional Limits for tasks assessed                      Exercises Other Exercises Other Exercises: Sit/stand x3 with RW, cga/close sup--emphasis on L LE position, WBing with movement transition Other Exercises: Standing LE therex, 1x10, with RW to promote increased stance/WBing L LE: closed-chain TKE, standing lateral weight shifts, R LE forward/retro/lateral stepping. Other Exercises: Toilet transfer, ambulatory with RW, cga/close sup; difficulty maintaining gait mechanics with divided attention.    General Comments General comments (skin integrity, edema, etc.): L hip surgical incision with hemovac intact; honeycomb dressing clean and dry      Pertinent Vitals/Pain Pain Assessment: 0-10 Pain Score: 2  Pain Location: L hip Pain Descriptors / Indicators: Aching Pain Intervention(s): Limited activity within patient's tolerance;Monitored during session;Repositioned    Home Living Family/patient expects to be discharged to:: Skilled nursing facility Living Arrangements: Alone             Additional Comments: Patient lives alone in one story  home with 3 steps to enter and a ramp.      Prior Function Level of Independence: Independent with assistive device(s)      Comments: rolling walker   PT Goals (current goals can now be found in the care plan section) Acute Rehab PT  Goals Patient Stated Goal: Go to [facility]. PT Goal Formulation: With patient Time For Goal Achievement: 09/15/15 Potential to Achieve Goals: Good Progress towards PT goals: Progressing toward goals    Frequency  BID    PT Plan Current plan remains appropriate    Co-evaluation             End of Session Equipment Utilized During Treatment: Gait belt Activity Tolerance: Patient tolerated treatment well Patient left: in bed;with call bell/phone within reach;with bed alarm set     Time: 1357-1420 PT Time Calculation (min) (ACUTE ONLY): 23 min  Charges:  $Gait Training: 8-22 mins $Therapeutic Exercise: 8-22 mins                    G Codes:      Kaitlynd Phillips H. Manson Passey, PT, DPT, NCS 09/01/2015, 2:29 PM [phone]

## 2015-09-01 NOTE — Care Management Note (Signed)
Case Management Note  Patient Details  Name: [name] MRN: [id number] Date of Birth: [birth year]  Subjective/Objective:                  Met with patient to discuss discharge planning. She wants to go to [facility] for rehab at discharge. She states she lives alone and has no DME in the home. She states her only family is a sister that is disabled. PT is currently recommending SNF.She has used [facility] for HHPT in the past. She uses [facility] for Rx.   Action/Plan: CSW updated. List of home health agencies left with patient. RNCM will continue to follow.   Expected Discharge Date:                  Expected Discharge Plan:     In-House Referral:  Clinical Social Work  Discharge planning Services  CM Consult  Post Acute Care Choice:  Home Health, Durable Medical Equipment Choice offered to:  Patient  DME Arranged:    DME Agency:     HH Arranged:    [place] Agency:     Status of Service:  In process, will continue to follow  Medicare Important Message Given:    Date Medicare IM Given:    Medicare IM give by:    Date Additional Medicare IM Given:    Additional Medicare Important Message give by:     If discussed at [place] of Stay Meetings, dates discussed:    Additional Comments:  Marshell Garfinkel, RN 09/01/2015, 11:33 AM

## 2015-09-01 NOTE — Progress Notes (Signed)
Slow mobility. oob to chair and bsc with assist. Neuro checks wnl. Pain relief with oxycodone. Pending d/c tomorrow

## 2015-09-01 NOTE — Evaluation (Signed)
Physical Therapy Evaluation Patient Details Name: TIANNI ESCAMILLA MRN: [id number] DOB: Jun 07, 1941 Today's Date: 09/01/2015   History of Present Illness  admitted for acute hospitalization status post L THA, WBAT, posterior hip precautions (08/31/15).  Clinical Impression  Upon evaluation, patient alert and oriented, follows all commands and demonstrates good safety awareness/insight.  L LE strength grossly 3-/5 (limited by pain); ROM grossly [facility] for active assist movement within THPs.  Min cuing/assist for awareness and adherence to THPs with all movement transitions.  Post-op pain and ROM deficits result in deficits with functional mobility, dynamic balance, self-care and gait; requiring min assist from therapist with all transfers and gait (30').  Very limited weight acceptance and active use of L LE at this time.  Clinical presentation evolving; will progress as appropriate.  Personal factors affecting prognosis/discharge plan: patient lives alone, steps to enter home, current THPs, age. Would benefit from skilled PT to address above deficits and promote optimal return to PLOF; recommend transition to STR upon discharge from acute hospitalization.     Follow Up Recommendations SNF    Equipment Recommendations       Recommendations for Other Services       Precautions / Restrictions Precautions Precautions: Fall;Posterior Hip Restrictions Weight Bearing Restrictions: Yes LLE Weight Bearing: Weight bearing as tolerated      Mobility  Bed Mobility Overal bed mobility: Needs Assistance Bed Mobility: Supine to Sit     Supine to sit: Min assist     General bed mobility comments: assist for R LE management/position with movement transition  Transfers Overall transfer level: Needs assistance Equipment used: Rolling walker (2 wheeled) Transfers: Sit to/from Stand Sit to Stand: Min assist         General transfer comment: excessive weight shift to R LE, limited WBing L  LE  Ambulation/Gait Ambulation/Gait assistance: Min assist Ambulation Distance (Feet): 30 Feet Assistive device: Rolling walker (2 wheeled)       General Gait Details: step to gait pattern with very limited stance time/weight acceptance L LE; unable to achieve/tolerate L knee extension or foot flat in loading phases  Stairs            Wheelchair Mobility    Modified Rankin (Stroke Patients Only)       Balance Overall balance assessment: Needs assistance Sitting-balance support: No upper extremity supported;Feet supported Sitting balance-Leahy Scale: Good     Standing balance support: Bilateral upper extremity supported Standing balance-Leahy Scale: Fair                               Pertinent Vitals/Pain Pain Assessment: 0-10 Pain Score: 5  Pain Descriptors / Indicators: Aching Pain Intervention(s): Limited activity within patient's tolerance;Monitored during session;Repositioned    Home Living Family/patient expects to be discharged to:: Skilled nursing facility Living Arrangements: Alone               Additional Comments: At baseline, patient lives alone in single-story home with 3 steps to enter (also has ramp access if needed).  Intermittent help from sister available    Prior Function Level of Independence: Independent with assistive device(s)         Comments: Mod indep with household mobility and limited community activities using RW; denies fall history.     Hand Dominance        Extremity/Trunk Assessment   Upper Extremity Assessment: Overall WFL for tasks assessed  Lower Extremity Assessment:  (L hip 3-/5 (limited by pain), knee and ankle 4/5.  Neurovascularly intact)         Communication   Communication: No difficulties  Cognition Arousal/Alertness: Awake/alert Behavior During Therapy: WFL for tasks assessed/performed Overall Cognitive Status: Within Functional Limits for tasks assessed                       General Comments General comments (skin integrity, edema, etc.): L hip surgical incision with hemovac intact; honeycomb dressing clean and dry    Exercises Other Exercises Other Exercises: Supine L LE therex, 1x10, active ROM for muscular strength/endurnace: ankle pumps, quad sets, SAQs, heel slides, hip abduct/adduct Other Exercises: Verbally reviewed/educated patient on posterior THPs; patient voiced understanding. Will continue to reinforce throughout stay.      Assessment/Plan    PT Assessment Patient needs continued PT services  PT Diagnosis Difficulty walking;Generalized weakness;Acute pain   PT Problem List Decreased strength;Decreased range of motion;Decreased activity tolerance;Decreased balance;Decreased mobility;Decreased knowledge of use of DME;Decreased safety awareness;Decreased knowledge of precautions;Pain;Decreased skin integrity  PT Treatment Interventions DME instruction;Gait training;Stair training;Functional mobility training;Therapeutic activities;Therapeutic exercise;Balance training;Patient/family education   PT Goals (Current goals can be found in the Care Plan section) Acute Rehab PT Goals Patient Stated Goal: "I want to go back to [facility]" PT Goal Formulation: With patient Time For Goal Achievement: 09/15/15 Potential to Achieve Goals: Good    Frequency BID   Barriers to discharge Inaccessible home environment;Decreased caregiver support      Co-evaluation               End of Session Equipment Utilized During Treatment: Gait belt Activity Tolerance: Patient tolerated treatment well;Patient limited by pain Patient left: in bed;with call bell/phone within reach;with chair alarm set Nurse Communication: Mobility status         Time: 4128-7867 PT Time Calculation (min) (ACUTE ONLY): 29 min   Charges:   PT Evaluation $PT Eval Moderate Complexity: 1 Procedure PT Treatments $Therapeutic Exercise: 8-22 mins   PT G  Codes:        Reida Hem H. Manson Passey, PT, DPT, NCS 09/01/2015, 10:50 AM [phone]

## 2015-09-01 NOTE — Clinical Social Work Placement (Signed)
   CLINICAL SOCIAL WORK PLACEMENT  NOTE  Date:  09/01/2015  Patient Details  Name: [name] MRN: [id number] Date of Birth: Feb 07, 1941  Clinical Social Work is seeking post-discharge placement for this patient at the Skilled  Nursing Facility level of care (*CSW will initial, date and re-position this form in  chart as items are completed):  Yes   Patient/family provided with [facility] Clinical Social Work Department's list of facilities offering this level of care within the geographic area requested by the patient (or if unable, by the patient's family).  Yes   Patient/family informed of their freedom to choose among providers that offer the needed level of care, that participate in Medicare, Medicaid or managed care program needed by the patient, have an available bed and are willing to accept the patient.  Yes   Patient/family informed of [facility]'s ownership interest in [facility] and [facility], as well as of the fact that they are under no obligation to receive care at these facilities.  PASRR submitted to EDS on       PASRR number received on       Existing PASRR number confirmed on 09/01/15     FL2 transmitted to all facilities in geographic area requested by pt/family on 09/01/15     FL2 transmitted to all facilities within larger geographic area on       Patient informed that his/her managed care company has contracts with or will negotiate with certain facilities, including the following:            Patient/family informed of bed offers received.  Patient chooses bed at       Physician recommends and patient chooses bed at      Patient to be transferred to   on  .  Patient to be transferred to facility by       Patient family notified on   of transfer.  Name of family member notified:        PHYSICIAN       Additional Comment:    _______________________________________________ Dede Query, LCSW 09/01/2015, 5:10 PM

## 2015-09-01 NOTE — Clinical Social Work Note (Signed)
Clinical Social Work Assessment  Patient Details  Name: [name] MRN: [id number] Date of Birth: [birth year]  Date of referral:  09/01/15               Reason for consult:  Facility Placement                Permission sought to share information with:    Permission granted to share information::     Name::        Agency::     Relationship::     Contact Information:     Housing/Transportation Living arrangements for the past 2 months:  Single Family Home Source of Information:  Patient Patient Interpreter Needed:  None Criminal Activity/Legal Involvement Pertinent to Current Situation/Hospitalization:  No - Comment as needed Significant Relationships:  Adult Children Lives with:  Self Do you feel safe going back to the place where you live?  Yes Need for family participation in patient care:  No (Coment)  Care giving concerns:  Pt lives alone   Facilities manager / plan:  CSW met with pt to address consult for SNF. CSW introduced herself and explained role of social work. CSW also explained process of discharging to SNF. Pt stated that she has been to [facility] before and was pleased with the care, therefore would like to return. CSW initiated a bed search and will follow up with bed offers. CSW also called [facility], and it is under review. CSW will continue to follow.   Employment status:  Retired Nurse, adult PT Recommendations:  [place] / Referral to community resources:  [place]  Patient/Family's Response to care:  Pt was apprecative of CSW support.   Patient/Family's Understanding of and Emotional Response to Diagnosis, Current Treatment, and Prognosis:  Pt understands that pt needs STR at SNF prior to returning home.   Emotional Assessment Appearance:  Appears stated age Attitude/Demeanor/Rapport:  Other (Appropriate) Affect (typically observed):  Pleasant Orientation:   Oriented to Self, Oriented to Place, Oriented to  Time, Oriented to Situation Alcohol / Substance use:  Never Used Psych involvement (Current and /or in the community):  No (Comment)  Discharge Needs  Concerns to be addressed:  Denies Needs/Concerns at this time Readmission within the last 30 days:  No Current discharge risk:  None Barriers to Discharge:  Continued Medical Work up   [employer], LCSW 09/01/2015, 5:30 PM

## 2015-09-01 NOTE — Evaluation (Signed)
Occupational Therapy Evaluation Patient Details Name: [name] MRN: [id number] DOB: [birth year] Today's Date: 09/01/2015    History of Present Illness 75 year old female who came to [employer], WBAT, posterior hip precautions (08/31/15).   Clinical Impression   This patient is a 75 year old female who came to [facility] for a L total hip replacement (posterior approach).  Patient lives in a one story home with steps or ramp to enter.  She has deficits in pain, mobility and activities of daily living. She had been modified independent (used a rolling walker) with ADL and functional mobility. She now requires assistance and would benefit from Occupational Therapy for ADL/functional mobility training while .staying within hip precautions (posterior approach) .      Follow Up Recommendations  SNF    Equipment Recommendations       Recommendations for Other Services       Precautions / Restrictions Precautions Precautions: Fall;Posterior Hip Restrictions Weight Bearing Restrictions: Yes LLE Weight Bearing: Weight bearing as tolerated      Mobility Bed Mobility  Transfers            Balance                              ADL                                         General ADL Comments: Patient had been modified  independent  Today practiced techniques for lower body dressing while staying within hip precautions (posterior approach). Patient used hip kit to Donned/doffed socks, shoes and pants to knees (drain still in place). She required minimal assist and physical and verbal cues for technique and safety. She required 3 verbal cues to stay within hip precautions. (she could name 2 of 3).       Vision     Perception     Praxis      Pertinent Vitals/Pain Pain Assessment: 0-10 Pain Score:  (Reported no pain when still, some increase with movement) Pain Descriptors / Indicators:  Aching Pain Intervention(s):     Hand Dominance     Extremity/Trunk Assessment Upper Extremity Assessment Upper Extremity Assessment: Overall WFL for tasks assessed   Lower Extremity Assessment Lower Extremity Assessment: Defer to PT evaluation       Communication Communication Communication: No difficulties   Cognition Arousal/Alertness: Awake/alert Behavior During Therapy: WFL for tasks assessed/performed Overall Cognitive Status: Within Functional Limits for tasks assessed                     General Comments       Exercises     Shoulder Instructions      Home Living Family/patient expects to be discharged to:: Skilled nursing facility Living Arrangements: Alone                               Additional Comments: Patient lives alone in one story home with 3 steps to enter and a ramp.        Prior Functioning/Environment Level of Independence: Independent with assistive device(s)        Comments: rolling walker    OT Diagnosis: Acute pain   OT Problem List: Decreased strength;Decreased activity tolerance;Decreased  knowledge of use of DME or AE;Decreased knowledge of precautions;Pain   OT Treatment/Interventions: Self-care/ADL training    OT Goals(Current goals can be found in the care plan section) Acute Rehab OT Goals Patient Stated Goal: Go to [facility]. OT Goal Formulation: With patient Time For Goal Achievement: 09/15/15 Potential to Achieve Goals: Good  OT Frequency: Min 1X/week   Barriers to D/C:            Co-evaluation              End of Session Equipment Utilized During Treatment:  (hip kit)  Activity Tolerance:   Patient left: in chair;with call bell/phone within reach;with bed alarm set   Time: 8938-1017 OT Time Calculation (min): 31 min Charges:  OT General Charges $OT Visit: 1 Procedure OT Evaluation $OT Eval Low Complexity: 1 Procedure OT Treatments $Self Care/Home Management : 8-22  mins G-Codes:    Myrene Galas, MS/OTR/L  09/01/2015, 11:30 AM

## 2015-09-01 NOTE — [place] FL2 (Signed)
[place] MEDICAID FL2 LEVEL OF CARE SCREENING TOOL     IDENTIFICATION  Patient Name: [name] Birthdate: Apr 04, 1941 Sex: female Admission Date (Current Location): 08/31/2015  [place] and [place] Number:  Chiropodist and Address:  [facility], [address], [place], [place] [address]      Provider Number: [id number]  Attending Physician Name and Address:  Donato Heinz, MD  Relative Name and Phone Number:       Current Level of Care: Hospital Recommended Level of Care: Skilled Nursing Facility Prior Approval Number:    Date Approved/Denied:   PASRR Number: [id number]  Discharge Plan: SNF    Current Diagnoses: Patient Active Problem List   Diagnosis Date Noted  . 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  BEHAVIORAL SYMPTOMS/MOOD NEUROLOGICAL BOWEL NUTRITION STATUS      Continent Diet (Regular Diet)  AMBULATORY STATUS COMMUNICATION OF NEEDS Skin   Limited Assist Verbally Normal                       Personal Care Assistance Level of Assistance  Bathing, Feeding, Dressing Bathing Assistance: Limited assistance Feeding assistance: Limited assistance Dressing Assistance: Limited assistance     Functional Limitations Info  Sight, Hearing, Speech Sight Info: Adequate Hearing Info: Adequate Speech Info: Adequate    SPECIAL CARE FACTORS FREQUENCY  PT (By licensed PT), OT (By licensed OT)     PT Frequency: 5 OT Frequency: 5            Contractures      Additional Factors Info  Code Status, Allergies Code Status Info: Full Code Allergies Info: No known allergies           Current Medications (09/01/2015):  This is the current hospital active medication list Current Facility-Administered Medications  Medication Dose Route Frequency Provider Last Rate Last Dose  . 0.9 %   sodium chloride infusion   Intravenous Continuous 10/30/2015, MD 100 mL/hr at 09/01/15 0800    . acetaminophen (OFIRMEV) IV 1,000 mg  1,000 mg Intravenous 4 times per day 10/30/15, MD   1,000 mg at 09/01/15 1113  . acetaminophen (TYLENOL) tablet 650 mg  650 mg Oral Q6H PRN 10/30/15, MD       Or  . acetaminophen (TYLENOL) suppository 650 mg  650 mg Rectal Q6H PRN Donato Heinz, MD      . alum & mag hydroxide-simeth (MAALOX/MYLANTA) 200-200-20 MG/5ML suspension 30 mL  30 mL Oral Q4H PRN 02-26-2001, MD      . bisacodyl (DULCOLAX) suppository 10 mg  10 mg Rectal Daily PRN Donato Heinz, MD      . celecoxib (CELEBREX) capsule 200 mg  200 mg Oral BID Donato Heinz, MD   200 mg at 09/01/15 0759  . diphenhydrAMINE (BENADRYL) 12.5 MG/5ML elixir 12.5-25 mg  12.5-25 mg Oral Q4H PRN 09-19-1984, MD      . enoxaparin (LOVENOX) injection 30 mg  30 mg Subcutaneous Q12H Donato Heinz, MD   30 mg at 09/01/15 0758  . ferrous sulfate tablet 325 mg  325 mg Oral BID WC 10/30/15, MD   325 mg at 09/01/15 1630  . magnesium hydroxide (MILK OF MAGNESIA) suspension 30 mL  30 mL Oral Daily PRN 10/30/15  Hoot[place], MD   30 mL at 09/01/15 1628  . menthol-cetylpyridinium (CEPACOL) lozenge 3 mg  1 lozenge Oral PRN Donato Heinz, MD       Or  . phenol (CHLORASEPTIC) mouth spray 1 spray  1 spray Mouth/Throat PRN Donato Heinz, MD      . metoCLOPramide (REGLAN) tablet 10 mg  10 mg Oral TID AC & HS Donato Heinz, MD   10 mg at 09/01/15 1628  . morphine 2 MG/ML injection 2-4 mg  2-4 mg Intravenous Q2H PRN Donato Heinz, MD   2 mg at 08/31/15 1942  . ondansetron (ZOFRAN) tablet 4 mg  4 mg Oral Q6H PRN Donato Heinz, MD       Or  . ondansetron (ZOFRAN) injection 4 mg  4 mg Intravenous Q6H PRN Donato Heinz, MD      . oxyCODONE (Oxy IR/ROXICODONE) immediate release tablet 5-10 mg  5-10 mg Oral Q4H PRN Donato Heinz, MD   5 mg at 09/01/15 1628  . senna-docusate (Senokot-S) tablet 1 tablet  1 tablet  Oral BID Donato Heinz, MD   1 tablet at 09/01/15 0758  . sodium phosphate (FLEET) 7-19 GM/118ML enema 1 enema  1 enema Rectal Once PRN Donato Heinz, MD      . traMADol Janean Sark) tablet 50-100 mg  50-100 mg Oral Q4H PRN Donato Heinz, MD         Discharge Medications: Please see discharge summary for a list of discharge medications.  Relevant Imaging Results:  Relevant Lab Results:   Additional Information SSN:  [id number]  Dede Query, LCSW

## 2015-09-01 NOTE — Progress Notes (Signed)
ORTHOPAEDICS PROGRESS NOTE  PATIENT NAME: [name] DOB: Dec 23, 1940  MRN: [id number]  POD # 1: Left total hip arthroplasty  Subjective: Patient is awake and alert. Pain has been well-controlled. She denies any nausea or vomiting.  Objective: Vital signs in last 24 hours: Temp:  [97.7 F (36.5 C)-98.8 F (37.1 C)] 98.8 F (37.1 C) (01/12 0400) Pulse Rate:  [63-97] 81 (01/12 0400) Resp:  [9-19] 16 (01/12 0400) BP: (102-153)/(38-96) 122/52 mmHg (01/12 0400) SpO2:  [96 %-100 %] 96 % (01/12 0400) Weight:  [61.689 kg (136 lb)] 61.689 kg (136 lb) (01/11 0933)  Intake/Output from previous day: 01/11 0701 - 01/12 0700 In: 2420 [P.O.:720; I.V.:1700] Out: 1335 [Urine:1035; Drains:200; Blood:100]   Recent Labs  09/01/15 0528  K 3.8  CL 105  CO2 27  BUN 7  CREATININE 0.59  GLUCOSE 92  CALCIUM 8.2*   CBC results are pending.  EXAM General: Well-developed well-nourished female seen in no acute distress. Lungs: clear to auscultation Cardiac: normal rate, regular rhythm, normal S1, S2, no murmurs, rubs, clicks or gallops Left lower extremity: Thigh is supple and without significant swelling. Left hip dressing is dry and intact. No significant ecchymosis. Homans test is negative. Neurologic: Awake, alert, and oriented. Sensory and motor function are grossly intact.  Assessment: Left total hip arthroplasty  Active Problems:   S/P total hip arthroplasty   Plan: Today's goal were reviewed with the patient.  Physical therapy and occupational therapy as per total hip arthroplasty rehabilitation protocol. Plan is to go Skilled nursing facility after hospital stay. Her preference is [facility]. DVT Prophylaxis - Lovenox, Foot Pumps and TED hose  Jessamine Barcia P. Angie Fava M.D.

## 2015-09-02 LAB — BASIC METABOLIC PANEL
ANION GAP: 7 (ref 5–15)
BUN: 7 mg/dL (ref 6–20)
CHLORIDE: 101 mmol/L (ref 101–111)
CO2: 27 mmol/L (ref 22–32)
Calcium: 8.2 mg/dL — ABNORMAL LOW (ref 8.9–10.3)
Creatinine, Ser: 0.62 mg/dL (ref 0.44–1.00)
GFR calc Af Amer: >60 mL/min (ref >60)
GFR calc non Af Amer: >60 mL/min (ref >60)
Glucose, Bld: 94 mg/dL (ref 65–99)
POTASSIUM: 3.7 mmol/L (ref 3.5–5.1)
SODIUM: 135 mmol/L (ref 135–145)

## 2015-09-02 LAB — CBC
HEMATOCRIT: 28.3 % — AB (ref 35.0–47.0)
HEMOGLOBIN: 8.8 g/dL — AB (ref 12.0–16.0)
MCH: 23.1 pg — AB (ref 26.0–34.0)
MCHC: 31.1 g/dL — ABNORMAL LOW (ref 32.0–36.0)
MCV: 74.3 fL — AB (ref 80.0–100.0)
Platelets: 358 10*3/uL (ref 150–440)
RBC: 3.81 MIL/uL (ref 3.80–5.20)
RDW: 17.9 % — ABNORMAL HIGH (ref 11.5–14.5)
WBC: 6.4 10*3/uL (ref 3.6–11.0)

## 2015-09-02 MED ORDER — ENOXAPARIN SODIUM 30 MG/0.3ML ~~LOC~~ SOLN: 30.0000 mg | Freq: Two times a day (BID) | SUBCUTANEOUS | Status: DC

## 2015-09-02 MED ORDER — OXYCODONE HCL 5 MG PO TABS: 5.0000 mg | ORAL_TABLET | ORAL | Status: DC | PRN

## 2015-09-02 MED ORDER — LACTULOSE 10 GM/15ML PO SOLN
10.0000 g | Freq: Two times a day (BID) | ORAL | Status: DC | PRN
Start: 2015-09-02 — End: 2015-09-03

## 2015-09-02 MED ORDER — TRAMADOL HCL 50 MG PO TABS: 50.0000 mg | ORAL_TABLET | ORAL | Status: DC | PRN

## 2015-09-02 NOTE — Care Management (Signed)
Patient said she has not had a BM but "nurse gave me something". Per RN patient was refusing laxatives earlier this AM. Order received for patient to transfer to [facility] today for rehab. CSW working on packet. Discussed with RN and patient need for stool.

## 2015-09-02 NOTE — Discharge Summary (Signed)
Physician Discharge Summary  Patient ID: [name] MRN: [id number] DOB/AGE: Oct 22, 1940 75 y.o.  Admit date: 08/31/2015 Discharge date: 09/03/2015  Admission Diagnoses:  DEGENERATIVE LEFT HIP ARTHROPLASTY   Discharge Diagnoses: Patient Active Problem List   Diagnosis Date Noted  . S/P total hip arthroplasty 08/31/2015    Past Medical History  Diagnosis Date  . Arthritis      Transfusion: No transfusions given doing this admission.   Consultants (if any):   case management for bed placement  Discharged Condition: Improved  Hospital Course: [name] is an 75 y.o. female who was admitted 08/31/2015 with a diagnosis of degenerative arthrosis left hip and went to the operating room on 08/31/2015 and underwent the above named procedures.    Surgeries:Procedure(s): TOTAL HIP ARTHROPLASTY on 08/31/2015  PRE-OPERATIVE DIAGNOSIS: Degenerative arthrosis of the left hip, primary  POST-OPERATIVE DIAGNOSIS: Same  PROCEDURE: Left total hip arthroplasty  SURGEON: Jena Gauss. M.D.  ASSISTANT: Van Clines, PA (present and scrubbed throughout the case, critical for assistance with exposure, retraction, instrumentation, and closure)  ANESTHESIA: spinal  ESTIMATED BLOOD LOSS: 100 mL  FLUIDS REPLACED: 1400 mL of crystalloid  DRAINS: 2 medium drains to a Hemovac reservoir  IMPLANTS UTILIZED: DePuy 13.5 mm small stature AML femoral stem, 56 mm OD [employer] acetabular component, +4 mm neutral [employer] polyethylene insert, and a 36 mm M-SPEC +1.5 mm hip ball; 1.2 mm cerclage wire  INDICATIONS FOR SURGERY: [name] is a 75 y.o. year old female with a long history of progressive hip and groin pain. X-rays demonstrated severe degenerative changes. The patient had not seen any significant improvement despite conservative nonsurgical intervention. After discussion of the risks and benefits of surgical intervention, the patient expressed  understanding of the risks benefits and agree with plans for total hip arthroplasty.   The risks, benefits, and alternatives were discussed at length including but not limited to the risks of infection, bleeding, nerve injury, stiffness, blood clots, the need for revision surgery, limb length inequality, dislocation, cardiopulmonary complications, among others, and they were willing to proceed. Patient tolerated the surgery well. No complications .Patient was taken to PACU where she was stabilized and then transferred to the orthopedic floor.  Patient started on Lovenox 30 q 12 hrs. Foot pumps applied bilaterally at 80 mm hg. Heels elevated off bed with rolled towels. No evidence of DVT. Calves non tender. Negative Homan. Physical therapy started on day #1 for gait training and transfer with OT starting on  day #1 for ADL and assisted devices. Patient has done well with therapy. Ambulated greater than 110 feet upon being discharged.  Patient's IV and Foley were DC'd on day #1 and hemovac was d/c on day #2.   She was given perioperative antibiotics:  Anti-infectives    Start     Dose/Rate Route Frequency Ordered Stop   08/31/15 1730  ceFAZolin (ANCEF) IVPB 2 g/50 mL premix     2 g 100 mL/hr over 30 Minutes Intravenous Every 6 hours 08/31/15 1622 09/01/15 1340   08/31/15 0945  ceFAZolin (ANCEF) IVPB 2 g/50 mL premix     2 g 100 mL/hr over 30 Minutes Intravenous  Once 08/31/15 0931 08/31/15 1142   08/31/15 0934  ceFAZolin (ANCEF) 2-3 GM-% IVPB SOLR    Comments:  Buck Mam: cabinet override      08/31/15 0934 08/31/15 2144    .  She was fitted with AV 1 compression foot pump devices, instructed on  heel pumps, early ambulation, and TED stockings for DVT prophylaxis.  She benefited maximally from the hospital stay and there were no complications.    Recent vital signs:  Filed Vitals:   09/01/15 2014 09/02/15 0535  BP: 133/50 131/55  Pulse: 83 91  Temp: 98.4 F (36.9 C) 98.4 F  (36.9 C)  Resp: 18 18    Recent laboratory studies:  Lab Results  Component Value Date   HGB 8.8* 09/02/2015   HGB 8.7* 09/01/2015   HGB 10.3* 08/18/2015   Lab Results  Component Value Date   WBC 6.4 09/02/2015   PLT 358 09/02/2015   Lab Results  Component Value Date   INR 1.09 08/18/2015   Lab Results  Component Value Date   NA 135 09/02/2015   K 3.7 09/02/2015   CL 101 09/02/2015   CO2 27 09/02/2015   BUN 7 09/02/2015   CREATININE 0.62 09/02/2015   GLUCOSE 94 09/02/2015    Discharge Medications:     Medication List    STOP taking these medications        aspirin EC 81 MG tablet      TAKE these medications        acetaminophen 500 MG tablet  Commonly known as:  TYLENOL  Take 500 mg by mouth every 6 (six) hours as needed.     celecoxib 200 MG capsule  Commonly known as:  CELEBREX  Take 200 mg by mouth 2 (two) times daily.     enoxaparin 30 MG/0.3ML injection  Commonly known as:  LOVENOX  Inject 0.3 mLs (30 mg total) into the skin every 12 (twelve) hours.     oxyCODONE 5 MG immediate release tablet  Commonly known as:  Oxy IR/ROXICODONE  Take 1-2 tablets (5-10 mg total) by mouth every 4 (four) hours as needed for severe pain.     traMADol 50 MG tablet  Commonly known as:  ULTRAM  Take 1-2 tablets (50-100 mg total) by mouth every 4 (four) hours as needed for moderate pain.        Diagnostic Studies: Dg Hip Port Unilat With Pelvis 1v Left  08/31/2015  CLINICAL DATA:  Postop left total hip arthroplasty. EXAM: DG HIP (WITH OR WITHOUT PELVIS) 1V PORT LEFT COMPARISON:  Right hip radiographs 07/01/2013. FINDINGS: Status post left total hip arthroplasty. There is a proximal left femoral cerclage wire. No underlying femoral fracture identified. There is a small amount of gas in the soft tissue surrounding the left hip. A surgical drain and skin staples are in place. Previous right total hip arthroplasty has a stable appearance. IMPRESSION: No demonstrated  complication following left total hip arthroplasty. Electronically Signed   By: Carey Bullocks M.D.   On: 08/31/2015 15:57    Disposition:   Patient is stable and ready for discharge.  Pt to be discharged to [facility] today.      Discharge Instructions    Diet - low sodium heart healthy    Complete by:  As directed      Increase activity slowly    Complete by:  As directed            Follow-up Information    Follow up with Donato Heinz, MD On 10/13/2015.   Specialty:  Orthopedic Surgery   Why:  at 3:15pm   Contact information:   [address] [facility] [place] [address] [phone]        Signed: Valeria Batman PA-C 09/02/2015, 7:40 AM

## 2015-09-02 NOTE — Progress Notes (Signed)
All discharge information faxed to [facility].  CSW just received call from insurance with concerns of patient discharging to SNF.  Will need to send to a higher review.  Will call CSW in the AM to determine if patient is approved for SNF.    CSW informed RN and Care Manager who will talk with patient about a back up for possible home health in the event the insurance is not approved  CSW will continue to follow patient.  Sammuel Hines. LCSWA Clinical Social Work Department [phone] 4:10 PM

## 2015-09-02 NOTE — Progress Notes (Signed)
Physical Therapy Treatment Patient Details Name: [name] MRN: [id number] DOB: 1941-01-13 Today's Date: 09/02/2015    History of Present Illness admitted for acute hospitalization status post L THA, WBAT, posterior hip precautions (08/31/15).    PT Comments    Patient progressing well with mobility performance (increasing distance, decreased assist from therapist), but continues to require moderate cuing/instruction from therapist for adherence to THPs with all functional activities and for gait mechanics during mobility efforts.  Very motivated and eager to progress.   Follow Up Recommendations  SNF     Equipment Recommendations       Recommendations for Other Services       Precautions / Restrictions Precautions Precautions: Fall;Posterior Hip Restrictions Weight Bearing Restrictions: Yes LLE Weight Bearing: Weight bearing as tolerated    Mobility  Bed Mobility Overal bed mobility: Needs Assistance Bed Mobility: Supine to Sit     Supine to sit: Min assist     General bed mobility comments: assist for R LE management/position with movement transition  Transfers Overall transfer level: Needs assistance Equipment used: Rolling walker (2 wheeled) Transfers: Sit to/from Stand Sit to Stand: Min guard;Supervision         General transfer comment: cuing for progression towards symmetrical WBing bilat LEs, to prevent excessive IR L LE with movement transition  Ambulation/Gait Ambulation/Gait assistance: Min guard;Supervision Ambulation Distance (Feet): 150 Feet Assistive device: Rolling walker (2 wheeled)   Gait velocity: 10' walk time, 9 seconds Gait velocity interpretation: <1.8 ft/sec, indicative of risk for recurrent falls General Gait Details: mixed step to vs. step through gait pattern; cuing for increasing R LE step height/length to promote optimal stance time/weight acceptance L LE. Maintains broad BOS with mild L LE circumduction in swing  at times;  remains very guarded in advancement of L LE   Stairs            Wheelchair Mobility    Modified Rankin (Stroke Patients Only)       Balance Overall balance assessment: Needs assistance Sitting-balance support: No upper extremity supported;Feet supported Sitting balance-Leahy Scale: Good     Standing balance support: Bilateral upper extremity supported;Single extremity supported;During functional activity Standing balance-Leahy Scale: Fair                      Cognition Arousal/Alertness: Awake/alert Behavior During Therapy: WFL for tasks assessed/performed Overall Cognitive Status: Within Functional Limits for tasks assessed                      Exercises Other Exercises Other Exercises: Upper body dressing, mod indep with set up/sup; lower body dressing, mod assist to thread LEs (mod cuing for awareness/adherence to THPs with functional ADLs), sit/stand with RW close sup, standing balance while pulling clothing over hips, min assist (encouraged to maintain unilateral UE support on RW at all times during task) Other Exercises: Toilet transfer, ambulatory with RW, cga/close sup; sit/stand from [facility] with RW, close sup; hand hygiene at sink, close sup Other Exercises: Grooming, oral care at sink, standing with RW for external stabilization, close sup; maintains excessive weight shift to R LE throughout all standing activities; demonstrates very limited functional reach (3-4" outside immediate BOS), requing contralateral UE support for external stabilization.    General Comments        Pertinent Vitals/Pain Pain Assessment: 0-10 Pain Score: 2  Pain Location: L hip Pain Descriptors / Indicators: Aching Pain Intervention(s): Limited activity within patient's tolerance;Monitored during session;Repositioned  Home Living                      Prior Function            PT Goals (current goals can now be found in the care plan section) Acute  Rehab PT Goals Patient Stated Goal: Go to [facility]. PT Goal Formulation: With patient Time For Goal Achievement: 09/15/15 Potential to Achieve Goals: Good Progress towards PT goals: Progressing toward goals    Frequency  BID    PT Plan Current plan remains appropriate    Co-evaluation             End of Session Equipment Utilized During Treatment: Gait belt Activity Tolerance: Patient tolerated treatment well Patient left: in chair;with call bell/phone within reach;with chair alarm set     Time: 8177-1165 PT Time Calculation (min) (ACUTE ONLY): 25 min  Charges:  $Gait Training: 8-22 mins $Therapeutic Activity: 8-22 mins                    G Codes:       Dovber Ernest H. Manson Passey, PT, DPT, NCS 09/02/2015, 10:17 AM [phone]

## 2015-09-02 NOTE — Progress Notes (Signed)
Physical Therapy Treatment Patient Details Name: VAEDA WESTALL MRN: [id number] DOB: 23-Sep-1940 Today's Date: 09/02/2015    History of Present Illness admitted for acute hospitalization status post L THA, WBAT, posterior hip precautions (08/31/15).    PT Comments    Able to complete gait around nursing station this PM with noted improvement in overall gait mechanics/fluidity.  Difficulty maintaining with turns, obstacle negotiation and short-distance activities; will continue to emphasize mechanics and posterior THPs with all mobility.   Follow Up Recommendations  SNF     Equipment Recommendations       Recommendations for Other Services       Precautions / Restrictions Precautions Precautions: Fall;Posterior Hip Restrictions Weight Bearing Restrictions: Yes LLE Weight Bearing: Weight bearing as tolerated    Mobility  Bed Mobility Overal bed mobility: Needs Assistance Bed Mobility: Sit to Supine       Sit to supine: Min assist   General bed mobility comments: assist for R LE management/position with movement transition  Transfers Overall transfer level: Needs assistance Equipment used: Rolling walker (2 wheeled) Transfers: Sit to/from Stand Sit to Stand: Min guard;Supervision            Ambulation/Gait Ambulation/Gait assistance: Min guard Ambulation Distance (Feet): 220 Feet Assistive device: Rolling walker (2 wheeled)       General Gait Details: progressive increase in R LE step length (and L LE stance time) as distance progressed.  Patient much more fluid and confidence in gait efforts; will continue to encourage L heel strik/toe off and knee flex/ext as appropriate.   Stairs            Wheelchair Mobility    Modified Rankin (Stroke Patients Only)       Balance Overall balance assessment: Needs assistance Sitting-balance support: No upper extremity supported;Feet supported Sitting balance-Leahy Scale: Good     Standing balance  support: Bilateral upper extremity supported Standing balance-Leahy Scale: Fair                      Cognition Arousal/Alertness: Awake/alert Behavior During Therapy: WFL for tasks assessed/performed Overall Cognitive Status: Within Functional Limits for tasks assessed                      Exercises Other Exercises Other Exercises: Seated LE therex, 1x15, AROM for muscular strength/endurnace: ankle pumps, LAQs, marching (within THPs), hip abduct/adduct. Other Exercises: Toilet transfer, ambulatory with RW, cga/close sup; sit/stand from [facility] with RW, close sup; hand hygiene at sink, close sup    General Comments        Pertinent Vitals/Pain Pain Score: 3  Pain Location: L hip Pain Descriptors / Indicators: Aching Pain Intervention(s): Limited activity within patient's tolerance;Monitored during session;Repositioned    Home Living                      Prior Function            PT Goals (current goals can now be found in the care plan section) Acute Rehab PT Goals Patient Stated Goal: Go to [facility]. PT Goal Formulation: With patient Time For Goal Achievement: 09/15/15 Potential to Achieve Goals: Good Progress towards PT goals: Progressing toward goals    Frequency  BID    PT Plan Current plan remains appropriate    Co-evaluation             End of Session Equipment Utilized During Treatment: Gait belt Activity Tolerance: Patient tolerated treatment  well Patient left: in bed;with call bell/phone within reach;with bed alarm set     Time: 1345-1404 PT Time Calculation (min) (ACUTE ONLY): 19 min  Charges:  $Gait Training: 8-22 mins                    G Codes:      Kaliya Shreiner H. Manson Passey, PT, DPT, NCS 09/02/2015, 2:39 PM [phone]

## 2015-09-02 NOTE — Progress Notes (Signed)
   Subjective: 2 Days Post-Op Procedure(s) (LRB): TOTAL HIP ARTHROPLASTY (Left) Patient reports pain as mild.   Patient is well, and has had no acute complaints or problems Continue with physical therapy today.  Plan is to go Rehab after hospital stay. no nausea and no vomiting Patient denies any chest pains or shortness of breath. Objective: Vital signs in last 24 hours: Temp:  [98.4 F (36.9 C)-99 F (37.2 C)] 98.4 F (36.9 C) (01/13 0535) Pulse Rate:  [78-91] 91 (01/13 0535) Resp:  [18] 18 (01/13 0535) BP: (122-133)/(47-55) 131/55 mmHg (01/13 0535) SpO2:  [97 %-99 %] 97 % (01/13 0535) well approximated incision Heels are non tender and elevated off the bed using rolled towels Intake/Output from previous day: 01/12 0701 - 01/13 0700 In: 2756.7 [P.O.:840; I.V.:1916.7] Out: 1845 [Urine:1750; Drains:95] Intake/Output this shift:     Recent Labs  09/01/15 0733 09/02/15 0613  HGB 8.7* 8.8*    Recent Labs  09/01/15 0733 09/02/15 0613  WBC 5.9 6.4  RBC 3.74* 3.81  HCT 27.8* 28.3*  PLT 345 358    Recent Labs  09/01/15 0528 09/02/15 0613  NA 137 135  K 3.8 3.7  CL 105 101  CO2 27 27  BUN 7 7  CREATININE 0.59 0.62  GLUCOSE 92 94  CALCIUM 8.2* 8.2*   No results for input(s): LABPT, INR in the last 72 hours.  EXAM General - Patient is Alert, Appropriate and Oriented Extremity - Neurologically intact Neurovascular intact Sensation intact distally Intact pulses distally Dorsiflexion/Plantar flexion intact Dressing - dressing C/D/I Motor Function - intact, moving foot and toes well on exam.    Past Medical History  Diagnosis Date  . Arthritis     Assessment/Plan: 2 Days Post-Op Procedure(s) (LRB): TOTAL HIP ARTHROPLASTY (Left) Active Problems:   S/P total hip arthroplasty  Estimated body mass index is 21.3 kg/(m^2) as calculated from the following:   Height as of this encounter: 5\' 7"  (1.702 m).   Weight as of this encounter: 61.689 kg (136  lb). Up with therapy Plan for discharge tomorrow Discharge to SNF  Labs: Were reviewed DVT Prophylaxis - Lovenox, Foot Pumps and TED hose Weight-Bearing as tolerated to left leg Patient needs to have a bowel movement Please change dressing prior to being discharged Labs in a.m.  . [facility] PA [facility] 09/02/2015, 7:32 AM

## 2015-09-02 NOTE — Progress Notes (Signed)
Report called to [place] at [facility].  Pt to be transported via EMS

## 2015-09-02 NOTE — Care Management (Signed)
I was notified by CSW that patient's insurance has denied SNF due to ambulatory status but is currently under medical review. I have emailed weekend case manager and Dr. Ernest Pine to update. When I spoke with patient, she became upset stating "I don't have anyone to drive me or help in the home for groceries or pick up her discharge medications; I was not prepared for this". The insurance company is aware that patient lives alone. I have requested rolling walker from [facility] with [facility] in the event patient has to go home. If she goes to SNF the walker will need to be refunded by [facility] after walker is recovered. Patient wants [facility] for home health per initial CM assessment. Patient has Medicaid and CSW is checking with [facility] to see if they can take her under that to pay room and board.

## 2015-09-02 NOTE — Care Management Important Message (Signed)
Important Message  Patient Details  Name: [name] MRN: [id number] Date of Birth: Jul 07, 1941   Medicare Important Message Given:  Yes    Olegario Messier A [place]inana 09/02/2015, 8:59 AM

## 2015-09-02 NOTE — Progress Notes (Signed)
CSW presented SNF bed offers to patient.  Patient has selected [facility].  CSW will continue to follow patient and assist with ongoing and discharge needs when patient is medically stable to discharge.  Sammuel Hines. LCSWA Clinical Social Work Department [phone] 11:22 AM

## 2015-09-02 NOTE — Discharge Instructions (Signed)

## 2015-09-03 DIAGNOSIS — Z96642 Presence of left artificial hip joint: Secondary | ICD-10-CM | POA: Diagnosis not present

## 2015-09-03 DIAGNOSIS — M159 Polyosteoarthritis, unspecified: Secondary | ICD-10-CM | POA: Diagnosis not present

## 2015-09-03 DIAGNOSIS — Z471 Aftercare following joint replacement surgery: Secondary | ICD-10-CM | POA: Insufficient documentation

## 2015-09-03 DIAGNOSIS — R112 Nausea with vomiting, unspecified: Secondary | ICD-10-CM | POA: Diagnosis not present

## 2015-09-03 DIAGNOSIS — T148 Other injury of unspecified body region: Secondary | ICD-10-CM | POA: Diagnosis not present

## 2015-09-03 DIAGNOSIS — M6281 Muscle weakness (generalized): Secondary | ICD-10-CM | POA: Diagnosis not present

## 2015-09-03 DIAGNOSIS — Z96641 Presence of right artificial hip joint: Secondary | ICD-10-CM | POA: Diagnosis not present

## 2015-09-03 MED ORDER — ENOXAPARIN SODIUM 30 MG/0.3ML ~~LOC~~ SOLN: 30.0000 mg | Freq: Two times a day (BID) | SUBCUTANEOUS | Status: DC

## 2015-09-03 NOTE — Progress Notes (Signed)
Physical Therapy Treatment Patient Details Name: [name] MRN: [id number] DOB: 1941/07/12 Today's Date: 09/03/2015    History of Present Illness admitted for acute hospitalization status post L THA, WBAT, posterior hip precautions (08/31/15).    PT Comments    Pt progressing with mobility this session with decreased assist for bed mobility, transfers, and ambulation but is unable to recall or demonstrate total hip precautions consistently.  Pt continues to require moderate cueing and instruction (verbal and demonstration) to maintain hip precautions especially with sit<>stand transfers.  Pt was unable to remember to extend leg before sitting/standing despite multiple trials and instruction throughout session.  Reinforced importance of hip precautions in order to maintain total hip replacement.  Pt at safety risk if not able to maintain hip precautions independently and without cueing.  Pt ascended/descended 3 steps with Min A with instruction for every step in sequence and demonstrated  increased fear at end of training.  PT cntinuesto recommend SNF to address safety with transfers and stairs and to continue to progress with ambulation and therex.   Follow Up Recommendations  SNF     Equipment Recommendations       Recommendations for Other Services       Precautions / Restrictions Precautions Precautions: Fall;Posterior Hip Restrictions LLE Weight Bearing: Weight bearing as tolerated    Mobility  Bed Mobility Overal bed mobility: Modified Independent       Supine to sit: Modified independent (Device/Increase time)     General bed mobility comments: HOB elevated  Transfers Overall transfer level: Modified independent Equipment used: Rolling walker (2 wheeled) Transfers: Sit to/from Stand Sit to Stand: Modified independent (Device/Increase time)         General transfer comment: one hand on RW and other on bed; cueing to extend L LE for maintaining hip  precautions  Ambulation/Gait Ambulation/Gait assistance: Supervision Ambulation Distance (Feet): 220 Feet Assistive device: Rolling walker (2 wheeled) Gait Pattern/deviations: Antalgic     General Gait Details: progressing to step-through gait pattern although step length remains shortened bilaterally; cues to look up and not at feet.   Stairs Stairs: Yes Stairs assistance: Min assist Stair Management: One rail Left Number of Stairs: 3 General stair comments: Constant instruction for sequencing and safety with increased fear noted during training.  Wheelchair Mobility    Modified Rankin (Stroke Patients Only)       Balance Overall balance assessment: Needs assistance Sitting-balance support: No upper extremity supported;Feet unsupported Sitting balance-Leahy Scale: Good     Standing balance support: Bilateral upper extremity supported Standing balance-Leahy Scale: Fair                     Cognition Arousal/Alertness: Awake/alert Behavior During Therapy: WFL for tasks assessed/performed Overall Cognitive Status: Within Functional Limits for tasks assessed                      Exercises Other Exercises Other Exercises: Seated LAQ, toe/heel raises x 15 reps each; supine AP, QS, GS, HS x 15 reps bilaterally Other Exercises: Toilet transfer with supervision, mod I for hygeine    General Comments        Pertinent Vitals/Pain Pain Score: 3  Pain Location: Lhip Pain Descriptors / Indicators: Sore Pain Intervention(s): Limited activity within patient's tolerance    Home Living                      Prior Function  PT Goals (current goals can now be found in the care plan section) Acute Rehab PT Goals Patient Stated Goal: Go to [facility]. PT Goal Formulation: With patient Time For Goal Achievement: 09/15/15 Potential to Achieve Goals: Good Progress towards PT goals: Progressing toward goals    Frequency  BID     PT Plan Current plan remains appropriate    Co-evaluation             End of Session Equipment Utilized During Treatment: Gait belt Activity Tolerance: Patient tolerated treatment well Patient left: with call bell/phone within reach;in chair;with chair alarm set     Time: 8299-3716 PT Time Calculation (min) (ACUTE ONLY): 32 min  Charges:  $Gait Training: 8-22 mins $Therapeutic Exercise: 8-22 mins                    G Codes:      Jamion Carter A Jaylean Buenaventura, PT 09/15/15, 10:40 AM

## 2015-09-03 NOTE — Progress Notes (Signed)
EMS staff here to take patient to [facility].

## 2015-09-03 NOTE — Progress Notes (Addendum)
Subjective: 3 Days Post-Op Procedure(s) (LRB): TOTAL HIP ARTHROPLASTY (Left) Patient reports pain as mild.   Patient is well, and has had no acute complaints or problems Continue with physical therapy today.  Plan is to go Rehab after hospital stay. no nausea and no vomiting Patient denies any chest pains or shortness of breath. Objective: Vital signs in last 24 hours: Temp:  [97.8 F (36.6 C)-99.3 F (37.4 C)] 97.8 F (36.6 C) (01/14 0741) Pulse Rate:  [83-99] 84 (01/14 0741) Resp:  [16-18] 16 (01/14 0741) BP: (124-139)/(48-59) 129/56 mmHg (01/14 0741) SpO2:  [96 %-100 %] 96 % (01/14 0741) well approximated incision Heels are non tender and elevated off the bed using rolled towels Intake/Output from previous day: 01/13 0701 - 01/14 0700 In: 1246.7 [P.O.:480; I.V.:766.7] Out: 0  Intake/Output this shift:     Recent Labs  09/01/15 0733 09/02/15 0613  HGB 8.7* 8.8*    Recent Labs  09/01/15 0733 09/02/15 0613  WBC 5.9 6.4  RBC 3.74* 3.81  HCT 27.8* 28.3*  PLT 345 358    Recent Labs  09/01/15 0528 09/02/15 0613  NA 137 135  K 3.8 3.7  CL 105 101  CO2 27 27  BUN 7 7  CREATININE 0.59 0.62  GLUCOSE 92 94  CALCIUM 8.2* 8.2*   No results for input(s): LABPT, INR in the last 72 hours.  EXAM General - Patient is Alert, Appropriate and Oriented Extremity - Neurologically intact Neurovascular intact Sensation intact distally Intact pulses distally Dorsiflexion/Plantar flexion intact Dressing - dressing C/D/I Motor Function - intact, moving foot and toes well on exam.    Past Medical History  Diagnosis Date  . Arthritis     Assessment/Plan: 3 Days Post-Op Procedure(s) (LRB): TOTAL HIP ARTHROPLASTY (Left) Active Problems:   S/P total hip arthroplasty  Estimated body mass index is 21.3 kg/(m^2) as calculated from the following:   Height as of this encounter: 5\' 7"  (1.702 m).   Weight as of this encounter: 61.689 kg (136 lb). Up with  therapy Discharge to SNF  Labs: Were reviewed DVT Prophylaxis - Lovenox, Foot Pumps and TED hose Weight-Bearing as tolerated to left leg Patient has had a bowel movement while hospitalized Please change dressing prior to being discharged  Plan is to discharge to SNF.  Pt lives at home along, she has no family that can assist with day to day tasks.  Sister is disable and in a wheelchair.  PT and OT both recommending SNF due to gait issues and stability problems.  , PA-C [facility] 09/03/2015, 9:00 AM

## 2015-09-03 NOTE — Progress Notes (Signed)
Clinical Social Worker informed byJon Chauncey Mann, PA  that patient is medically ready to discharge to SNF, Patient and  Her sister are in a agreement with plan.  CSW received call from Amy with [facility] auth #  [id number].  She will call [facility] to provide the number.  [facility] provided patient's room number 402 and number to call for report [phone] . All discharge information faxed to  Facility. Rx's added to discharge packet.   RN will call report and patient will discharge to [facility] via EMS.  Sammuel Hines. LCSWA Clinical Social Work Department [phone] 1:43 PM

## 2015-09-03 NOTE — Progress Notes (Signed)
Occupational Therapy Treatment Patient Details Name: LIVIYA SANTINI MRN: [id number] DOB: [birth year] Today's Date: 09/03/2015    History of present illness 75 year old came to [facility] for a L THR (posterior).   OT comments  Patient needs constant cueing to stay within hip precautions (posterior approach).  Follow Up Recommendations  SNF    Equipment Recommendations       Recommendations for Other Services      Precautions / Restrictions Precautions Precautions: Fall;Posterior Hip Restrictions LLE Weight Bearing: Weight bearing as tolerated       Mobility Bed Mobility  Transfers            Balance                     ADL                                         General ADL Comments: . Patient fully dressed but willing to practice lower body dressing using hip kit. Patient practiced techniques for lower body dressing (donning doffing shoes, socks and pants to knees) and although could name 2 of 3 hip precautions, and needed minimal assist, she needed frequent physical and verbal cues to stay within hip precautions.       Vision                     Perception     Praxis      Cognition   Behavior During Therapy: WFL for tasks assessed/performed Overall Cognitive Status: Within Functional Limits for tasks assessed                       Extremity/Trunk Assessment               Exercises   Shoulder Instructions       General Comments      Pertinent Vitals/ Pain       Pain Score: 3  Pain Location: L hip Pain Descriptors / Indicators: Sore Pain Intervention(s): Limited activity within patient's tolerance  Home Living                                          Prior Functioning/Environment              Frequency       Progress Toward Goals  OT Goals(current goals can now be found in the care plan section)     Acute Rehab OT Goals Patient Stated Goal: Go to [facility].   Plan      Co-evaluation                 End of Session Equipment Utilized During Treatment:  (Hip kit)   Activity Tolerance     Patient Left in chair;with call bell/phone within reach;with chair alarm set   Nurse Communication          Time: 6067-7034 OT Time Calculation (min): 14 min  Charges: OT General Charges $OT Visit: 1 Procedure OT Treatments $Self Care/Home Management : 8-22 mins Sharon Mt, MS/OTR/L  Sharon Mt 09/03/2015, 12:07 PM

## 2015-09-03 NOTE — Care Management Note (Addendum)
Case Management Note  Patient Details  Name: [name] MRN: [id number] Date of Birth: 1940-10-18  Subjective/Objective:     Dr Joice Lofts is on 1-A and talked to [name] in a person-to-person review of discharge orders. Dr Joice Lofts is currently awaiting a call back from the insurance company as to whether or not they will approve SNF. [facility] Social Work and Case Management are following for final discharge determination. [name] does not have any hospital discharge orders at this time. If she is discharged home, [name] will need a rolling walker and possibly a bedside commode.              Action/Plan:   Expected Discharge Date:                  Expected Discharge Plan:     In-House Referral:  Clinical Social Work  Discharge planning Services  CM Consult  Post Acute Care Choice:  Home Health, Durable Medical Equipment Choice offered to:  Patient  DME Arranged:    DME Agency:     HH Arranged:    HH Agency:     Status of Service:  In process, will continue to follow  Medicare Important Message Given:  Yes Date Medicare IM Given:    Medicare IM give by:    Date Additional Medicare IM Given:    Additional Medicare Important Message give by:     If discussed at Long Length of Stay Meetings, dates discussed:    Additional Comments:  Mirielle Byrum A, RN 09/03/2015, 12:03 PM

## 2015-09-03 NOTE — Clinical Social Work Placement (Signed)
   CLINICAL SOCIAL WORK PLACEMENT  NOTE  Date:  09/03/2015  Patient Details  Name: [name] MRN: [id number] Date of Birth: 02-Nov-1940  Clinical Social Work is seeking post-discharge placement for this patient at the Skilled  Nursing Facility level of care (*CSW will initial, date and re-position this form in  chart as items are completed):  Yes   Patient/family provided with [facility] Clinical Social Work Department's list of facilities offering this level of care within the geographic area requested by the patient (or if unable, by the patient's family).  Yes   Patient/family informed of their freedom to choose among providers that offer the needed level of care, that participate in Medicare, Medicaid or managed care program needed by the patient, have an available bed and are willing to accept the patient.  Yes   Patient/family informed of [facility]'s ownership interest in [facility] and [facility], as well as of the fact that they are under no obligation to receive care at these facilities.  PASRR submitted to EDS on       PASRR number received on       Existing PASRR number confirmed on 09/01/15     FL2 transmitted to all facilities in geographic area requested by pt/family on 09/01/15     FL2 transmitted to all facilities within larger geographic area on       Patient informed that his/her managed care company has contracts with or will negotiate with certain facilities, including the following:        Yes   Patient/family informed of bed offers received.  Patient chooses bed at  [facility])     Physician recommends and patient chooses bed at      Patient to be transferred to  [facility]) on 09/03/15.  Patient to be transferred to facility by  (EMS)     Patient family notified on 09/03/15 of transfer.  Name of family member notified:   (Sister Billie)     PHYSICIAN       Additional Comment:     _______________________________________________ Soundra Pilon, LCSW 09/03/2015, 1:38 PM

## 2015-09-03 NOTE — Progress Notes (Signed)
Report called to [facility]. Ems called for transport

## 2015-09-05 LAB — SURGICAL PATHOLOGY

## 2015-09-09 DIAGNOSIS — R112 Nausea with vomiting, unspecified: Secondary | ICD-10-CM | POA: Diagnosis not present

## 2015-09-09 DIAGNOSIS — M159 Polyosteoarthritis, unspecified: Secondary | ICD-10-CM | POA: Diagnosis not present

## 2015-09-20 DIAGNOSIS — Z471 Aftercare following joint replacement surgery: Secondary | ICD-10-CM | POA: Diagnosis not present

## 2015-09-20 DIAGNOSIS — Z96642 Presence of left artificial hip joint: Secondary | ICD-10-CM | POA: Diagnosis not present

## 2015-09-20 DIAGNOSIS — Z791 Long term (current) use of non-steroidal anti-inflammatories (NSAID): Secondary | ICD-10-CM | POA: Diagnosis not present

## 2015-09-20 DIAGNOSIS — M1990 Unspecified osteoarthritis, unspecified site: Secondary | ICD-10-CM | POA: Diagnosis not present

## 2015-09-26 DIAGNOSIS — Z791 Long term (current) use of non-steroidal anti-inflammatories (NSAID): Secondary | ICD-10-CM | POA: Diagnosis not present

## 2015-09-26 DIAGNOSIS — Z96642 Presence of left artificial hip joint: Secondary | ICD-10-CM | POA: Diagnosis not present

## 2015-09-26 DIAGNOSIS — M1990 Unspecified osteoarthritis, unspecified site: Secondary | ICD-10-CM | POA: Diagnosis not present

## 2015-09-26 DIAGNOSIS — Z471 Aftercare following joint replacement surgery: Secondary | ICD-10-CM | POA: Diagnosis not present

## 2015-10-04 DIAGNOSIS — Z471 Aftercare following joint replacement surgery: Secondary | ICD-10-CM | POA: Diagnosis not present

## 2015-10-04 DIAGNOSIS — Z791 Long term (current) use of non-steroidal anti-inflammatories (NSAID): Secondary | ICD-10-CM | POA: Diagnosis not present

## 2015-10-04 DIAGNOSIS — Z96642 Presence of left artificial hip joint: Secondary | ICD-10-CM | POA: Diagnosis not present

## 2015-10-04 DIAGNOSIS — M1990 Unspecified osteoarthritis, unspecified site: Secondary | ICD-10-CM | POA: Diagnosis not present

## 2015-10-13 DIAGNOSIS — M25552 Pain in left hip: Secondary | ICD-10-CM | POA: Diagnosis not present

## 2015-10-19 DIAGNOSIS — Z791 Long term (current) use of non-steroidal anti-inflammatories (NSAID): Secondary | ICD-10-CM | POA: Diagnosis not present

## 2015-10-19 DIAGNOSIS — Z96642 Presence of left artificial hip joint: Secondary | ICD-10-CM | POA: Diagnosis not present

## 2015-10-19 DIAGNOSIS — M1990 Unspecified osteoarthritis, unspecified site: Secondary | ICD-10-CM | POA: Diagnosis not present

## 2015-10-31 ENCOUNTER — Ambulatory Visit (INDEPENDENT_AMBULATORY_CARE_PROVIDER_SITE_OTHER): Payer: PPO | Admitting: Podiatry

## 2015-10-31 ENCOUNTER — Encounter: Payer: Self-pay | Admitting: Podiatry

## 2015-10-31 DIAGNOSIS — L603 Nail dystrophy: Secondary | ICD-10-CM

## 2015-10-31 DIAGNOSIS — M79676 Pain in unspecified toe(s): Secondary | ICD-10-CM

## 2015-10-31 DIAGNOSIS — Q828 Other specified congenital malformations of skin: Secondary | ICD-10-CM

## 2015-10-31 DIAGNOSIS — B351 Tinea unguium: Secondary | ICD-10-CM

## 2015-10-31 NOTE — Progress Notes (Signed)
She presents today with chief complaint of painful elongated toenails.  Objective: Vital signs are stable she is alert and oriented 3. Her toenails are long thick yellow dystrophic onychomycotic with subungual debris. They're painful on palpation as well as debridement. She also has hallux valgus deformity and hammertoe deformities.  Assessment: Pain in limb secondary to onychomycosis.  Plan: Debridement of toenails 1 through 5 bilateral.

## 2015-12-29 DIAGNOSIS — Z130 Encounter for screening for diseases of the blood and blood-forming organs and certain disorders involving the immune mechanism: Secondary | ICD-10-CM | POA: Diagnosis not present

## 2015-12-29 DIAGNOSIS — R682 Dry mouth, unspecified: Secondary | ICD-10-CM | POA: Diagnosis not present

## 2016-01-06 DIAGNOSIS — R946 Abnormal results of thyroid function studies: Secondary | ICD-10-CM | POA: Diagnosis not present

## 2016-01-09 ENCOUNTER — Ambulatory Visit (INDEPENDENT_AMBULATORY_CARE_PROVIDER_SITE_OTHER): Payer: PPO | Admitting: Podiatry

## 2016-01-09 ENCOUNTER — Encounter: Payer: Self-pay | Admitting: Podiatry

## 2016-01-09 DIAGNOSIS — B351 Tinea unguium: Secondary | ICD-10-CM

## 2016-01-09 DIAGNOSIS — Q828 Other specified congenital malformations of skin: Secondary | ICD-10-CM | POA: Diagnosis not present

## 2016-01-09 DIAGNOSIS — M79676 Pain in unspecified toe(s): Secondary | ICD-10-CM | POA: Diagnosis not present

## 2016-01-09 NOTE — Progress Notes (Signed)
She presents today with chief complaint of painful elongated toenails with corns and calluses.  Objective: Vital signs are stable alert and oriented 3. Pulses are palpable. Neurologic sensorium is intact deep tendon reflexes are intact muscle strength is normal. Toenails are thick yellow dystrophic, mycotic and painful on palpation. Also multiple porokeratotic lesions plantar aspect of the bilateral foot and distal clavi. No open lesions or wounds.  Assessment: Pain in limb secondary to onychomycosis and porokeratosis.  Plan: Debridement of toenails 1 through 5 bilateral covered service secondary to pain debridement of all reactive hyperkeratosis.

## 2016-04-09 ENCOUNTER — Ambulatory Visit: Payer: PPO | Admitting: Podiatry

## 2016-04-16 ENCOUNTER — Encounter: Payer: Self-pay | Admitting: Podiatry

## 2016-04-16 ENCOUNTER — Ambulatory Visit (INDEPENDENT_AMBULATORY_CARE_PROVIDER_SITE_OTHER): Payer: PPO | Admitting: Podiatry

## 2016-04-16 DIAGNOSIS — B351 Tinea unguium: Secondary | ICD-10-CM

## 2016-04-16 DIAGNOSIS — M79676 Pain in unspecified toe(s): Secondary | ICD-10-CM

## 2016-04-16 DIAGNOSIS — Q828 Other specified congenital malformations of skin: Secondary | ICD-10-CM

## 2016-04-16 NOTE — Progress Notes (Signed)
She presents today chief complaint of painful elongated toenails.  Objective: Vital signs are stable she is alert and oriented 3. Pulses are palpable. Her toenails are thick yellow dystrophic with mycotic and painful elongated and painful on palpation as well as debridement.  Assessment: Pain in limb secondary to onychomycosis 1 through 5 bilateral.  Plan: Debridement of toenails 1 through 5 bilateral. Follow up with her as needed.

## 2016-05-04 DIAGNOSIS — E538 Deficiency of other specified B group vitamins: Secondary | ICD-10-CM | POA: Diagnosis not present

## 2016-05-04 DIAGNOSIS — R634 Abnormal weight loss: Secondary | ICD-10-CM | POA: Diagnosis not present

## 2016-05-04 DIAGNOSIS — E785 Hyperlipidemia, unspecified: Secondary | ICD-10-CM | POA: Diagnosis not present

## 2016-05-04 DIAGNOSIS — Z0000 Encounter for general adult medical examination without abnormal findings: Secondary | ICD-10-CM | POA: Diagnosis not present

## 2016-05-04 DIAGNOSIS — D509 Iron deficiency anemia, unspecified: Secondary | ICD-10-CM | POA: Diagnosis not present

## 2016-05-17 DIAGNOSIS — Z1211 Encounter for screening for malignant neoplasm of colon: Secondary | ICD-10-CM | POA: Diagnosis not present

## 2016-05-25 DIAGNOSIS — Z8739 Personal history of other diseases of the musculoskeletal system and connective tissue: Secondary | ICD-10-CM | POA: Diagnosis not present

## 2016-06-11 DIAGNOSIS — I10 Essential (primary) hypertension: Secondary | ICD-10-CM | POA: Diagnosis not present

## 2016-06-11 DIAGNOSIS — R946 Abnormal results of thyroid function studies: Secondary | ICD-10-CM | POA: Diagnosis not present

## 2016-06-11 DIAGNOSIS — M2550 Pain in unspecified joint: Secondary | ICD-10-CM | POA: Diagnosis not present

## 2016-06-11 DIAGNOSIS — E611 Iron deficiency: Secondary | ICD-10-CM | POA: Diagnosis not present

## 2016-06-11 DIAGNOSIS — E538 Deficiency of other specified B group vitamins: Secondary | ICD-10-CM | POA: Diagnosis not present

## 2016-06-18 ENCOUNTER — Encounter: Payer: Self-pay | Admitting: Podiatry

## 2016-06-18 ENCOUNTER — Ambulatory Visit (INDEPENDENT_AMBULATORY_CARE_PROVIDER_SITE_OTHER): Payer: PPO | Admitting: Podiatry

## 2016-06-18 DIAGNOSIS — Q828 Other specified congenital malformations of skin: Secondary | ICD-10-CM

## 2016-06-18 DIAGNOSIS — B351 Tinea unguium: Secondary | ICD-10-CM | POA: Diagnosis not present

## 2016-06-18 DIAGNOSIS — M79676 Pain in unspecified toe(s): Secondary | ICD-10-CM

## 2016-06-18 NOTE — Progress Notes (Signed)
She presents today chief complaint of painful elongated toenails.  Objective: Tenderness particularly dystrophic with mycotic. Pulses remain palpable. Severe injury from a hammertoe deformity is related bilateral.  Assessment: Pain limb secondary onychomycosis 1 through 5 bilateral. 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  Plan: Debridement of all reactive hyperkeratotic tissue debridement tenderness 1 through 5 bilateral.

## 2016-08-21 ENCOUNTER — Ambulatory Visit (INDEPENDENT_AMBULATORY_CARE_PROVIDER_SITE_OTHER): Payer: PPO | Admitting: Podiatry

## 2016-08-21 ENCOUNTER — Encounter: Payer: Self-pay | Admitting: Podiatry

## 2016-08-21 DIAGNOSIS — M79609 Pain in unspecified limb: Secondary | ICD-10-CM

## 2016-08-21 DIAGNOSIS — B351 Tinea unguium: Secondary | ICD-10-CM | POA: Diagnosis not present

## 2016-08-21 DIAGNOSIS — L603 Nail dystrophy: Secondary | ICD-10-CM

## 2016-08-21 DIAGNOSIS — L851 Acquired keratosis [keratoderma] palmaris et plantaris: Secondary | ICD-10-CM

## 2016-08-21 DIAGNOSIS — L608 Other nail disorders: Secondary | ICD-10-CM

## 2016-08-21 DIAGNOSIS — L84 Corns and callosities: Secondary | ICD-10-CM

## 2016-08-21 DIAGNOSIS — M79671 Pain in right foot: Secondary | ICD-10-CM

## 2016-08-21 DIAGNOSIS — M79672 Pain in left foot: Secondary | ICD-10-CM

## 2016-08-21 NOTE — Progress Notes (Signed)
SUBJECTIVE Patient  presents to office today complaining of elongated, thickened nails. Pain while ambulating in shoes. Patient is unable to trim their own nails.   OBJECTIVE General Patient is awake, alert, and oriented x 3 and in no acute distress. Derm painful hyperkeratotic callus lesions noted to the left foot 2. Skin is dry and supple bilateral. Negative open lesions or macerations. Remaining integument unremarkable. Nails are tender, long, thickened and dystrophic with subungual debris, consistent with onychomycosis, 1-5 bilateral. No signs of infection noted. Vasc  DP and PT pedal pulses palpable bilaterally. Temperature gradient within normal limits.  Neuro Epicritic and protective threshold sensation diminished bilaterally.  Musculoskeletal Exam No symptomatic pedal deformities noted bilateral. Muscular strength within normal limits.  ASSESSMENT 1. Onychodystrophic nails 1-5 bilateral with hyperkeratosis of nails.  2. Onychomycosis of nail due to dermatophyte bilateral 3. Pain in foot bilateral 4. Painful callus lesions left foot 2  PLAN OF CARE 1. Patient evaluated today.  2. Instructed to maintain good pedal hygiene and foot care.  3. Mechanical debridement of nails 1-5 bilaterally performed using a nail nipper. Filed with dremel without incident.  4. Medically necessary excisional debridement of painful callus lesions was performed to the left foot 2  5. Return to clinic in 3 mos.    Felecia Shelling, DPM [facility]  Dr. Felecia Shelling, DPM   [address]                                        [place], [place] [address]                Office [phone]  Fax [phone]

## 2016-08-30 DIAGNOSIS — Z96642 Presence of left artificial hip joint: Secondary | ICD-10-CM | POA: Diagnosis not present

## 2016-09-13 DIAGNOSIS — E611 Iron deficiency: Secondary | ICD-10-CM | POA: Diagnosis not present

## 2016-10-03 DIAGNOSIS — M25562 Pain in left knee: Secondary | ICD-10-CM | POA: Diagnosis not present

## 2016-10-03 DIAGNOSIS — E538 Deficiency of other specified B group vitamins: Secondary | ICD-10-CM | POA: Diagnosis not present

## 2016-10-03 DIAGNOSIS — D649 Anemia, unspecified: Secondary | ICD-10-CM | POA: Diagnosis not present

## 2016-10-03 DIAGNOSIS — M25561 Pain in right knee: Secondary | ICD-10-CM | POA: Diagnosis not present

## 2016-10-03 DIAGNOSIS — G8929 Other chronic pain: Secondary | ICD-10-CM | POA: Diagnosis not present

## 2016-10-03 DIAGNOSIS — M25473 Effusion, unspecified ankle: Secondary | ICD-10-CM | POA: Diagnosis not present

## 2016-10-09 DIAGNOSIS — N390 Urinary tract infection, site not specified: Secondary | ICD-10-CM | POA: Diagnosis not present

## 2016-10-31 DIAGNOSIS — M25562 Pain in left knee: Secondary | ICD-10-CM | POA: Diagnosis not present

## 2016-10-31 DIAGNOSIS — M25561 Pain in right knee: Secondary | ICD-10-CM | POA: Diagnosis not present

## 2016-10-31 DIAGNOSIS — M170 Bilateral primary osteoarthritis of knee: Secondary | ICD-10-CM | POA: Diagnosis not present

## 2016-11-14 DIAGNOSIS — E538 Deficiency of other specified B group vitamins: Secondary | ICD-10-CM | POA: Diagnosis not present

## 2016-11-14 DIAGNOSIS — D509 Iron deficiency anemia, unspecified: Secondary | ICD-10-CM | POA: Diagnosis not present

## 2016-11-14 DIAGNOSIS — N390 Urinary tract infection, site not specified: Secondary | ICD-10-CM | POA: Diagnosis not present

## 2016-11-20 ENCOUNTER — Ambulatory Visit (INDEPENDENT_AMBULATORY_CARE_PROVIDER_SITE_OTHER): Payer: PPO | Admitting: Podiatry

## 2016-11-20 DIAGNOSIS — M659 Synovitis and tenosynovitis, unspecified: Secondary | ICD-10-CM

## 2016-11-20 DIAGNOSIS — M25572 Pain in left ankle and joints of left foot: Secondary | ICD-10-CM

## 2016-11-20 DIAGNOSIS — M7752 Other enthesopathy of left foot: Secondary | ICD-10-CM | POA: Diagnosis not present

## 2016-11-20 DIAGNOSIS — M25571 Pain in right ankle and joints of right foot: Secondary | ICD-10-CM | POA: Diagnosis not present

## 2016-11-20 DIAGNOSIS — M7751 Other enthesopathy of right foot: Secondary | ICD-10-CM

## 2016-11-20 DIAGNOSIS — B351 Tinea unguium: Secondary | ICD-10-CM

## 2016-11-20 DIAGNOSIS — M79609 Pain in unspecified limb: Secondary | ICD-10-CM

## 2016-11-20 DIAGNOSIS — L603 Nail dystrophy: Secondary | ICD-10-CM

## 2016-11-20 DIAGNOSIS — L608 Other nail disorders: Secondary | ICD-10-CM

## 2016-11-21 NOTE — Progress Notes (Signed)
   SUBJECTIVE Patient  presents to office today complaining of elongated, thickened nails. Pain while ambulating in shoes. Patient is unable to trim their own nails.  She also complains of bilateral ankle pain with walking.   OBJECTIVE General Patient is awake, alert, and oriented x 3 and in no acute distress. Derm Skin is dry and supple bilateral. Negative open lesions or macerations. Remaining integument unremarkable. Nails are tender, long, thickened and dystrophic with subungual debris, consistent with onychomycosis, 1-5 bilateral. No signs of infection noted. Vasc  DP and PT pedal pulses palpable bilaterally. Temperature gradient within normal limits.  Neuro Epicritic and protective threshold sensation diminished bilaterally.  Musculoskeletal Exam Pain on palpation to ankle joint bilaterally.  ASSESSMENT 1. Onychodystrophic nails 1-5 bilateral with hyperkeratosis of nails.  2. Onychomycosis of nail due to dermatophyte bilateral 3. Bilateral ankle pain with synovitis and capsulitis  PLAN OF CARE 1. Patient evaluated today.  2. Instructed to maintain good pedal hygiene and foot care.  3. Mechanical debridement of nails 1-5 bilaterally performed using a nail nipper. Filed with dremel without incident.  4. Injection of 0.5 cc Celestone Soluspan into bilateral ankle joints 5. RTC in 3 months.    Felecia Shelling, DPM [facility]  Dr. Felecia Shelling, DPM    [address]                                        [place], [place] [address]                Office [phone]  Fax [phone]

## 2016-11-22 DIAGNOSIS — M25561 Pain in right knee: Secondary | ICD-10-CM | POA: Diagnosis not present

## 2016-11-22 DIAGNOSIS — G8929 Other chronic pain: Secondary | ICD-10-CM | POA: Diagnosis not present

## 2016-11-22 DIAGNOSIS — M25562 Pain in left knee: Secondary | ICD-10-CM | POA: Diagnosis not present

## 2016-11-22 MED ORDER — BETAMETHASONE SOD PHOS & ACET 6 (3-3) MG/ML IJ SUSP: 3.0000 mg | Freq: Once | INTRAMUSCULAR | Status: DC

## 2016-11-27 DIAGNOSIS — M25561 Pain in right knee: Secondary | ICD-10-CM | POA: Diagnosis not present

## 2016-11-27 DIAGNOSIS — G8929 Other chronic pain: Secondary | ICD-10-CM | POA: Diagnosis not present

## 2016-11-27 DIAGNOSIS — M25562 Pain in left knee: Secondary | ICD-10-CM | POA: Diagnosis not present

## 2016-11-30 ENCOUNTER — Encounter: Payer: Self-pay | Admitting: Oncology

## 2016-11-30 ENCOUNTER — Inpatient Hospital Stay: Payer: PPO | Attending: Oncology | Admitting: Oncology

## 2016-11-30 ENCOUNTER — Inpatient Hospital Stay: Payer: PPO

## 2016-11-30 DIAGNOSIS — E871 Hypo-osmolality and hyponatremia: Secondary | ICD-10-CM | POA: Insufficient documentation

## 2016-11-30 DIAGNOSIS — Z79899 Other long term (current) drug therapy: Secondary | ICD-10-CM | POA: Diagnosis not present

## 2016-11-30 DIAGNOSIS — M1990 Unspecified osteoarthritis, unspecified site: Secondary | ICD-10-CM | POA: Diagnosis not present

## 2016-11-30 DIAGNOSIS — Z96642 Presence of left artificial hip joint: Secondary | ICD-10-CM | POA: Insufficient documentation

## 2016-11-30 DIAGNOSIS — Z7982 Long term (current) use of aspirin: Secondary | ICD-10-CM | POA: Insufficient documentation

## 2016-11-30 DIAGNOSIS — D509 Iron deficiency anemia, unspecified: Secondary | ICD-10-CM | POA: Insufficient documentation

## 2016-11-30 DIAGNOSIS — E785 Hyperlipidemia, unspecified: Secondary | ICD-10-CM | POA: Insufficient documentation

## 2016-11-30 LAB — CBC WITH DIFFERENTIAL/PLATELET
BASOS PCT: 1 %
Basophils Absolute: 0.1 10*3/uL (ref 0–0.1)
EOS ABS: 0.2 10*3/uL (ref 0–0.7)
EOS PCT: 3 %
HCT: 32.2 % — ABNORMAL LOW (ref 35.0–47.0)
Hemoglobin: 10.4 g/dL — ABNORMAL LOW (ref 12.0–16.0)
Lymphocytes Relative: 6 %
Lymphs Abs: 0.5 10*3/uL — ABNORMAL LOW (ref 1.0–3.6)
MCH: 22.4 pg — AB (ref 26.0–34.0)
MCHC: 32.3 g/dL (ref 32.0–36.0)
MCV: 69.4 fL — ABNORMAL LOW (ref 80.0–100.0)
MONO ABS: 0.5 10*3/uL (ref 0.2–0.9)
MONOS PCT: 6 %
NEUTROS PCT: 84 %
Neutro Abs: 7.2 10*3/uL — ABNORMAL HIGH (ref 1.4–6.5)
PLATELETS: 559 10*3/uL — AB (ref 150–440)
RBC: 4.63 MIL/uL (ref 3.80–5.20)
RDW: 21 % — ABNORMAL HIGH (ref 11.5–14.5)
WBC: 8.5 10*3/uL (ref 3.6–11.0)

## 2016-11-30 LAB — RETICULOCYTES
RBC.: 4.78 MIL/uL (ref 3.80–5.20)
RETIC CT PCT: 0.9 % (ref 0.4–3.1)
Retic Count, Absolute: 43 10*3/uL (ref 19.0–183.0)

## 2016-11-30 LAB — FOLATE: FOLATE: 10.4 ng/mL (ref >5.9)

## 2016-11-30 LAB — TSH: TSH: 11.729 u[IU]/mL — ABNORMAL HIGH (ref 0.350–4.500)

## 2016-11-30 LAB — SEDIMENTATION RATE: Sed Rate: 82 mm/hr — ABNORMAL HIGH (ref 0–30)

## 2016-11-30 NOTE — Progress Notes (Signed)
Hematology/Oncology Consult note [facility] Telephone:([phone] [phone]  Patient Care Team: [name], MD as PCP - General (Family Medicine)   Name of the patient: [name]  [id number]  14-Jan-1941    Reason for referral- iron deficiency anemia   Referring physician- Dr. [name]  Date of visit: 11/30/16   History of presenting illness- patient is a 76 year old female with a past medical history significant for hyperlipidemia, osteoporosis and arthritis. Recent blood work from 11/14/2016 showed B-12 level of 450. CBC showed white count of 8.3, H&H of 9.8/34 with an MCV of 77.4 and platelet count of 553. Her hemoglobin has been ranging around 9 05/26/2016 and a platelet count has been in the 500s since then as well. BMP showed elevated blood sugar of 200 and mild hyponatremia. BUN and creatinine were within normal limits. Iron studies revealed decreased iron of less than 10, iron saturation of less than 4%, TIBC of 252. Urinalysis did not reveal any hematuria.  Patient lives alone and is independent of her ADLs but her mobility is limited because of her bilateral leg swelling. She takes Celebrex daily for arthritis. Denies any routine use of other NSAIDs. Denies any blood in her st no family history of colon cancerools.   ECOG PS- 1-2  Pain scale- 8- chronic knee pain   Review of systems- Review of Systems  Constitutional: Positive for malaise/fatigue. Negative for chills, fever and weight loss.  HENT: Negative for congestion, ear discharge and nosebleeds.   Eyes: Negative for blurred vision.  Respiratory: Negative for cough, hemoptysis, sputum production, shortness of breath and wheezing.   Cardiovascular: Positive for leg swelling. Negative for chest pain, palpitations, orthopnea and claudication.  Gastrointestinal: Negative for abdominal pain, blood in stool, constipation, diarrhea, heartburn, melena, nausea and vomiting.    Genitourinary: Negative for dysuria, flank pain, frequency, hematuria and urgency.  Musculoskeletal: Positive for joint pain. Negative for back pain and myalgias.  Skin: Negative for rash.  Neurological: Negative for dizziness, tingling, focal weakness, seizures, weakness and headaches.  Endo/Heme/Allergies: Does not bruise/bleed easily.  Psychiatric/Behavioral: Negative for depression and suicidal ideas. The patient does not have insomnia.     No Known Allergies  Patient Active Problem List   Diagnosis Date Noted  . 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 Arthritis      Past Surgical History:  Procedure Laterality Date  . APPENDECTOMY    . JOINT REPLACEMENT Right 2014   THR  . TONSILLECTOMY    . TOTAL HIP ARTHROPLASTY Left 08/31/2015   Procedure: TOTAL HIP ARTHROPLASTY;  Surgeon: Dereck Leep, MD;  Location: [facility] ORS;  Service: Orthopedics;  Laterality: Left;    Social History   Social History  . Marital status: Widowed    Spouse name: N/A  . Number of children: N/A  . Years of education: N/A   Occupational History  . Not on file.   Social History Main Topics  . Smoking status: Never Smoker  . Smokeless tobacco: Never Used  . Alcohol use No  . Drug use: No  . Sexual activity: No   Other Topics Concern  . Not on file   Social History Narrative  . No narrative on file     Family History  Problem Relation Age of Onset  . Pancreatic cancer Mother   . Rheum arthritis Father   . Congestive Heart Failure Father   . Pancreatic cancer Maternal Aunt   .  Heart attack Maternal Grandfather   . Arthritis Paternal Grandmother   . Arthritis Paternal Grandfather      Current Outpatient Prescriptions:  .  aspirin EC 81 MG tablet, Take by mouth., Disp: , Rfl:  .  celecoxib (CELEBREX) 200 MG capsule, Take 200 mg by mouth 2 (two) times daily., Disp: , Rfl:  .  ferrous sulfate 325 (65 FE) MG EC tablet, Take by mouth.,  Disp: , Rfl:  .  furosemide (LASIX) 20 MG tablet, Take by mouth., Disp: , Rfl:  .  UNABLE TO FIND, , Disp: , Rfl:  .  vitamin B-12 (CYANOCOBALAMIN) 1000 MCG tablet, Take 1,000 mcg by mouth daily., Disp: , Rfl:  .  acetaminophen (TYLENOL) 500 MG tablet, Take 500 mg by mouth every 6 (six) hours as needed., Disp: , Rfl:  .  enoxaparin (LOVENOX) 30 MG/0.3ML injection, Inject 0.3 mLs (30 mg total) into the skin every 12 (twelve) hours. (Patient not taking: Reported on 08/21/2016), Disp: 28 Syringe, Rfl: 0  Current Facility-Administered Medications:  .  betamethasone acetate-betamethasone sodium phosphate (CELESTONE) injection 3 mg, 3 mg, Intramuscular, Once, Edrick Kins, DPM   Physical exam:  Vitals:   11/30/16 0927  BP: 124/69  Pulse: 98  Resp: 18  Temp: (!) 96.5 F (35.8 C)  TempSrc: Tympanic  Weight: 112 lb 11.2 oz (51.1 kg)  Height: '5\' 5"'  (1.651 m)   Physical Exam  Constitutional: She is oriented to person, place, and time.  Thin, well groomed, appears in no acute distress  HENT:  Head: Normocephalic and atraumatic.  Eyes: EOM are normal. Pupils are equal, round, and reactive to light.  Neck: Normal range of motion.  Cardiovascular: Normal rate, regular rhythm and normal heart sounds.   Pulmonary/Chest: Effort normal and breath sounds normal.  Abdominal: Soft. Bowel sounds are normal.  Musculoskeletal: She exhibits edema (b/l +2).  Neurological: She is alert and oriented to person, place, and time.  Skin: Skin is warm and dry.       CMP Latest Ref Rng & Units 09/02/2015  Glucose 65 - 99 mg/dL 94  BUN 6 - 20 mg/dL 7  Creatinine 0.44 - 1.00 mg/dL 0.62  Sodium 135 - 145 mmol/L 135  Potassium 3.5 - 5.1 mmol/L 3.7  Chloride 101 - 111 mmol/L 101  CO2 22 - 32 mmol/L 27  Calcium 8.9 - 10.3 mg/dL 8.2(L)   CBC Latest Ref Rng & Units 09/02/2015  WBC 3.6 - 11.0 K/uL 6.4  Hemoglobin 12.0 - 16.0 g/dL 8.8(L)  Hematocrit 35.0 - 47.0 % 28.3(L)  Platelets 150 - 440 K/uL 358      Assessment and plan- Patient is a 76 y.o. female referred to [place] for iron deficiency anemia  Today I will obtain a repeat CBC, reticulocyte count, haptoglobin, myeloma panel, TSH, ESR, celiac disease panel and H. pylori stool antigen. Patient's anemia is likely a combination of iron deficiency along with chronic disease given her low TIBC. Patient has also never had an EGD or colonoscopy before and I will refer her to GI for the same  Patient has been on oral iron for greater than 6 months and has had no improvement in her iron stores. I therefore recommend ferriheme 510 mg 2 doses starting next week. I discussed the risks and benefits of IV iron including all but not limited to fatigue and risk of infusion reactions. Patient understands and agrees to proceed. I will see her 6 weeks from today with repeat CBC and iron studies  Thank you for this kind referral and the opportunity to participate in the care of this patient   Visit Diagnosis 1. Iron deficiency anemia, unspecified iron deficiency anemia type     Dr. Randa Evens, MD, MPH [facility] Pager- [phone] 11/30/2016  10:18 AM

## 2016-11-30 NOTE — Progress Notes (Signed)
Patient Is here for follow up, she is doing well she does have minor knee pain off and on

## 2016-11-30 NOTE — Addendum Note (Signed)
Addended by: Corene Cornea on: 11/30/2016 10:40 AM   Modules accepted: Orders

## 2016-12-01 LAB — HAPTOGLOBIN: HAPTOGLOBIN: 360 mg/dL — AB (ref 34–200)

## 2016-12-03 DIAGNOSIS — G8929 Other chronic pain: Secondary | ICD-10-CM | POA: Diagnosis not present

## 2016-12-03 DIAGNOSIS — M25562 Pain in left knee: Secondary | ICD-10-CM | POA: Diagnosis not present

## 2016-12-03 DIAGNOSIS — M25561 Pain in right knee: Secondary | ICD-10-CM | POA: Diagnosis not present

## 2016-12-04 LAB — MULTIPLE MYELOMA PANEL, SERUM
ALBUMIN/GLOB SERPL: 0.6 — AB (ref 0.7–1.7)
ALPHA2 GLOB SERPL ELPH-MCNC: 1 g/dL (ref 0.4–1.0)
Albumin SerPl Elph-Mcnc: 2.5 g/dL — ABNORMAL LOW (ref 2.9–4.4)
Alpha 1: 0.5 g/dL — ABNORMAL HIGH (ref 0.0–0.4)
B-GLOBULIN SERPL ELPH-MCNC: 1 g/dL (ref 0.7–1.3)
GAMMA GLOB SERPL ELPH-MCNC: 2 g/dL — AB (ref 0.4–1.8)
GLOBULIN, TOTAL: 4.5 g/dL — AB (ref 2.2–3.9)
IGA: 251 mg/dL (ref 64–422)
IgG (Immunoglobin G), Serum: 1894 mg/dL — ABNORMAL HIGH (ref 700–1600)
IgM, Serum: 278 mg/dL — ABNORMAL HIGH (ref 26–217)
Total Protein ELP: 7 g/dL (ref 6.0–8.5)

## 2016-12-04 LAB — CELIAC DISEASE PANEL
Endomysial Ab, IgA: NEGATIVE
IGA: 251 mg/dL (ref 64–422)
Tissue Transglutaminase Ab, IgA: 3 U/mL (ref 0–3)

## 2016-12-05 ENCOUNTER — Telehealth: Payer: Self-pay | Admitting: *Deleted

## 2016-12-05 NOTE — Telephone Encounter (Signed)
-----   Message from Creig Hines, MD sent at 11/30/2016 11:54 AM EDT ----- Can you inform her pcp about her tsh and have him look into this?   ----- Message ----- From: Interface, Lab In [place] Sent: 11/30/2016  10:41 AM To: Creig Hines, MD

## 2016-12-05 NOTE — Telephone Encounter (Signed)
Called Dr. Terance Hart office to make them aware that pt was sent here for anemia and during the work up of labs her TSH was elevated.  Dr. Smith Robert would like to send tsh level to him and after his review any intervention needed to work with pt. They will send message to [place] and call back to let us know. I have snet tsh level to their office at [phone].

## 2016-12-07 ENCOUNTER — Inpatient Hospital Stay: Payer: PPO

## 2016-12-07 ENCOUNTER — Other Ambulatory Visit: Payer: Self-pay | Admitting: *Deleted

## 2016-12-07 VITALS — BP 121/71 | HR 98 | Temp 96.6°F | Resp 16

## 2016-12-07 DIAGNOSIS — D509 Iron deficiency anemia, unspecified: Secondary | ICD-10-CM

## 2016-12-07 MED ORDER — SODIUM CHLORIDE 0.9 % IV SOLN
Freq: Once | INTRAVENOUS | Status: AC
  Administered 2016-12-07: 11:00:00 via INTRAVENOUS
  Filled 2016-12-07: qty 1000

## 2016-12-07 MED ORDER — SODIUM CHLORIDE 0.9 % IV SOLN
510.0000 mg | Freq: Once | INTRAVENOUS | Status: AC
  Administered 2016-12-07: 510 mg via INTRAVENOUS
  Filled 2016-12-07: qty 17

## 2016-12-09 LAB — H. PYLORI ANTIGEN, STOOL: H. Pylori Stool Ag, Eia: NEGATIVE

## 2016-12-13 ENCOUNTER — Ambulatory Visit (INDEPENDENT_AMBULATORY_CARE_PROVIDER_SITE_OTHER): Payer: PPO | Admitting: Gastroenterology

## 2016-12-13 ENCOUNTER — Other Ambulatory Visit: Payer: Self-pay

## 2016-12-13 ENCOUNTER — Other Ambulatory Visit
Admission: RE | Admit: 2016-12-13 | Discharge: 2016-12-13 | Disposition: A | Discharge status: Home / Self Care | Payer: PPO | Source: Ambulatory Visit | Attending: Gastroenterology | Admitting: Gastroenterology

## 2016-12-13 ENCOUNTER — Encounter: Payer: Self-pay | Admitting: Gastroenterology

## 2016-12-13 VITALS — BP 119/66 | HR 105 | Temp 98.7°F | Resp 16 | Ht 65.0 in | Wt 112.0 lb

## 2016-12-13 DIAGNOSIS — D509 Iron deficiency anemia, unspecified: Secondary | ICD-10-CM

## 2016-12-13 LAB — URINALYSIS, ROUTINE W REFLEX MICROSCOPIC
BACTERIA UA: NONE SEEN
BILIRUBIN URINE: NEGATIVE
Glucose, UA: NEGATIVE mg/dL
HGB URINE DIPSTICK: NEGATIVE
KETONES UR: NEGATIVE mg/dL
LEUKOCYTES UA: NEGATIVE
Nitrite: NEGATIVE
PROTEIN: 30 mg/dL — AB
Specific Gravity, Urine: 1.02 (ref 1.005–1.030)
pH: 5 (ref 5.0–8.0)

## 2016-12-13 NOTE — Progress Notes (Signed)
Gastroenterology Consultation  Referring Provider:     Creig Hines, MD Primary Care Physician:  Megan Baseman, MD Primary Gastroenterologist:  Dr. Wyline [name]  Reason for Consultation:     Iron deficiency anemia         HPI:   [name] is a 76 y.o. y/o female referred for consultation & management  by Dr. Dorothey Baseman, MD.    He has been referred by Dr [name] for iron deficiency anemia. Recent blood work demonstrated a microcytic anemia with a Hb of 9.8 grams. Low iron suggestive of iron deficiency anemia. Since she has not had any GI evaluation has been referred to me.   Rectal bleeding: no  Nose bleeds: no  Vaginal bleeding : no  Hematemesis or hemoptysis : no  Blood in urine : no   No family history of colon cancer or polyps No change in bowel habits.   Past Medical History:  Diagnosis Date  . Arthritis     Past Surgical History:  Procedure Laterality Date  . APPENDECTOMY    . JOINT REPLACEMENT Right 2014   THR  . TONSILLECTOMY    . TOTAL HIP ARTHROPLASTY Left 08/31/2015   Procedure: TOTAL HIP ARTHROPLASTY;  Surgeon: Megan Heinz, MD;  Location: [facility] ORS;  Service: Orthopedics;  Laterality: Left;    Prior to Admission medications   Medication Sig Start Date End Date Taking? Authorizing Provider  aspirin EC 81 MG tablet Take by mouth.   Yes Historical Provider, MD  celecoxib (CELEBREX) 200 MG capsule Take 200 mg by mouth 2 (two) times daily.   Yes Historical Provider, MD  enoxaparin (LOVENOX) 30 MG/0.3ML injection Inject 0.3 mLs (30 mg total) into the skin every 12 (twelve) hours. 09/03/15  Yes Megan [place], PA-C  ferrous sulfate 325 (65 FE) MG EC tablet Take by mouth. 05/04/16 05/04/17 Yes Historical Provider, MD  furosemide (LASIX) 20 MG tablet Take by mouth. 11/02/16 11/02/17 Yes Historical Provider, MD  levothyroxine (SYNTHROID, LEVOTHROID) 50 MCG tablet Take by mouth. 12/06/16 12/06/17 Yes Historical Provider, MD  UNABLE TO FIND  01/20/15  Yes  Historical Provider, MD  vitamin B-12 (CYANOCOBALAMIN) 1000 MCG tablet Take 1,000 mcg by mouth daily.   Yes Historical Provider, MD  acetaminophen (TYLENOL) 500 MG tablet Take 500 mg by mouth every 6 (six) hours as needed.    Historical Provider, MD  amoxicillin (AMOXIL) 500 MG capsule  11/22/16   Historical Provider, MD  nitrofurantoin, macrocrystal-monohydrate, (MACROBID) 100 MG capsule  11/15/16   Historical Provider, MD  predniSONE (DELTASONE) 10 MG tablet  10/10/16   Historical Provider, MD    Family History  Problem Relation Age of Onset  . Pancreatic cancer Mother   . Rheum arthritis Father   . Congestive Heart Failure Father   . Pancreatic cancer Maternal Aunt   . Heart attack Maternal Grandfather   . Arthritis Paternal Grandmother   . Arthritis Paternal Grandfather      Social History  Substance Use Topics  . Smoking status: Never Smoker  . Smokeless tobacco: Never Used  . Alcohol use No    Allergies as of 12/13/2016  . (No Known Allergies)    Review of Systems:    All systems reviewed and negative except where noted in HPI.   Physical Exam:  BP 119/66 (BP Location: Right Arm, Patient Position: Sitting, Cuff Size: Normal)   Pulse (!) 105   Temp 98.7 F (37.1 C) (Oral)   Resp 16  Ht 5\' 5"  (1.651 m)   Wt 112 lb (50.8 kg)   BMI 18.64 kg/m  No LMP recorded. Patient is postmenopausal. Psych:  Alert and cooperative. Normal [name] and affect. General:   Alert, very thin  pleasant and cooperative in NAD Head:  Normocephalic and atraumatic. Eyes:  Sclera clear, no icterus.   Conjunctiva pink. Ears:  Normal auditory acuity. Nose:  No deformity, discharge, or lesions. Mouth:  No deformity or lesions,oropharynx pink & moist. Neck:  Supple; no masses or thyromegaly. Lungs:  Respirations even and unlabored.  Clear throughout to auscultation.   No wheezes, crackles, or rhonchi. No acute distress. Heart:  Regular rate and rhythm; no murmurs, clicks, rubs, or  gallops. Abdomen:  Normal bowel sounds.  No bruits.  Soft, non-tender and non-distended without masses, hepatosplenomegaly or hernias noted.  No guarding or rebound tenderness.    Msk:  Symmetrical without gross deformities. Good, equal movement & strength bilaterally. Neurologic:  Alert and oriented x3;  grossly normal neurologically. Psych:  Alert and cooperative. Normal [name] and affect.  Imaging Studies: No results found.  Assessment and Plan:   [name] is a 76 y.o. y/o female has been referred for iron deficiency anemia with no overt blood loss.   Plan   Check celiac serology and urine .I discussed the need for EGD+colonoscopy to diagnose any neoplasm or high grade polyps, AVMS- she was absolutely not interested and is aware of the risks of undiagnosed cancer or bleeding . Provided alternative options of a virtual CT colonography, barium studies which is was not keen either. She will come back to see me if she changes her mind.     Follow up as needed   Dr 61 MD

## 2016-12-14 ENCOUNTER — Inpatient Hospital Stay: Payer: PPO

## 2016-12-14 ENCOUNTER — Telehealth: Payer: Self-pay

## 2016-12-14 VITALS — BP 119/66 | HR 80 | Temp 98.6°F | Resp 18

## 2016-12-14 DIAGNOSIS — D509 Iron deficiency anemia, unspecified: Secondary | ICD-10-CM

## 2016-12-14 MED ORDER — SODIUM CHLORIDE 0.9 % IV SOLN
510.0000 mg | Freq: Once | INTRAVENOUS | Status: AC
  Administered 2016-12-14: 510 mg via INTRAVENOUS
  Filled 2016-12-14: qty 17

## 2016-12-14 MED ORDER — SODIUM CHLORIDE 0.9 % IV SOLN
Freq: Once | INTRAVENOUS | Status: AC
  Administered 2016-12-14: 11:00:00 via INTRAVENOUS
  Filled 2016-12-14: qty 1000

## 2016-12-14 NOTE — Telephone Encounter (Signed)
-----   Message from Wyline Mood, MD sent at 12/14/2016  8:55 AM EDT ----- Inform no blood in her urine but has protein that is new and she should discuss with her PCP   C/c Dr Terance Hart  Dr Wyline Mood  Gastroenterology/Hepatology Pager: [phone]

## 2016-12-14 NOTE — Telephone Encounter (Signed)
LVM for patient callback for results.   Results and comments have been forwarded to PCP Dr. Terance Hart as well for follow-up on protein in urine.

## 2016-12-18 LAB — CELIAC DISEASE PANEL
ENDOMYSIAL ANTIBODY IGA: NEGATIVE
IgA: 236 mg/dL (ref 64–422)
Tissue Transglutaminase Ab, IgA: 3 U/mL (ref 0–3)

## 2017-01-02 DIAGNOSIS — M170 Bilateral primary osteoarthritis of knee: Secondary | ICD-10-CM | POA: Diagnosis not present

## 2017-01-07 DIAGNOSIS — G8929 Other chronic pain: Secondary | ICD-10-CM | POA: Diagnosis not present

## 2017-01-07 DIAGNOSIS — M25562 Pain in left knee: Secondary | ICD-10-CM | POA: Diagnosis not present

## 2017-01-07 DIAGNOSIS — M25561 Pain in right knee: Secondary | ICD-10-CM | POA: Diagnosis not present

## 2017-01-10 ENCOUNTER — Other Ambulatory Visit: Payer: Self-pay | Admitting: *Deleted

## 2017-01-10 DIAGNOSIS — D509 Iron deficiency anemia, unspecified: Secondary | ICD-10-CM

## 2017-01-11 ENCOUNTER — Inpatient Hospital Stay: Payer: PPO

## 2017-01-11 ENCOUNTER — Inpatient Hospital Stay: Payer: PPO | Attending: Oncology | Admitting: Oncology

## 2017-01-11 VITALS — BP 109/54 | HR 80 | Temp 97.2°F | Ht 65.0 in | Wt 108.4 lb

## 2017-01-11 DIAGNOSIS — D509 Iron deficiency anemia, unspecified: Secondary | ICD-10-CM

## 2017-01-11 DIAGNOSIS — M25562 Pain in left knee: Secondary | ICD-10-CM | POA: Diagnosis not present

## 2017-01-11 DIAGNOSIS — E785 Hyperlipidemia, unspecified: Secondary | ICD-10-CM | POA: Insufficient documentation

## 2017-01-11 DIAGNOSIS — Z96642 Presence of left artificial hip joint: Secondary | ICD-10-CM | POA: Insufficient documentation

## 2017-01-11 DIAGNOSIS — Z79899 Other long term (current) drug therapy: Secondary | ICD-10-CM | POA: Diagnosis not present

## 2017-01-11 DIAGNOSIS — Z7982 Long term (current) use of aspirin: Secondary | ICD-10-CM | POA: Diagnosis not present

## 2017-01-11 DIAGNOSIS — D890 Polyclonal hypergammaglobulinemia: Secondary | ICD-10-CM | POA: Diagnosis not present

## 2017-01-11 DIAGNOSIS — M1990 Unspecified osteoarthritis, unspecified site: Secondary | ICD-10-CM | POA: Insufficient documentation

## 2017-01-11 DIAGNOSIS — E871 Hypo-osmolality and hyponatremia: Secondary | ICD-10-CM | POA: Insufficient documentation

## 2017-01-11 DIAGNOSIS — M25561 Pain in right knee: Secondary | ICD-10-CM | POA: Diagnosis not present

## 2017-01-11 DIAGNOSIS — G8929 Other chronic pain: Secondary | ICD-10-CM | POA: Diagnosis not present

## 2017-01-11 LAB — CBC
HEMATOCRIT: 30.4 % — AB (ref 35.0–47.0)
HEMOGLOBIN: 10.2 g/dL — AB (ref 12.0–16.0)
MCH: 25.1 pg — AB (ref 26.0–34.0)
MCHC: 33.4 g/dL (ref 32.0–36.0)
MCV: 75.2 fL — AB (ref 80.0–100.0)
Platelets: 540 10*3/uL — ABNORMAL HIGH (ref 150–440)
RBC: 4.05 MIL/uL (ref 3.80–5.20)
RDW: 22.8 % — ABNORMAL HIGH (ref 11.5–14.5)
WBC: 7.3 10*3/uL (ref 3.6–11.0)

## 2017-01-11 LAB — IRON AND TIBC
Iron: 14 ug/dL — ABNORMAL LOW (ref 28–170)
SATURATION RATIOS: 10 % — AB (ref 10.4–31.8)
TIBC: 145 ug/dL — ABNORMAL LOW (ref 250–450)
UIBC: 131 ug/dL

## 2017-01-11 LAB — FERRITIN: FERRITIN: 701 ng/mL — AB (ref 11–307)

## 2017-01-11 NOTE — Progress Notes (Signed)
Patient here for follow up. She is seeing and orthopedic doctor for knee pain. She has had a 4 pound wt loss

## 2017-01-15 DIAGNOSIS — M25561 Pain in right knee: Secondary | ICD-10-CM | POA: Diagnosis not present

## 2017-01-15 DIAGNOSIS — M25562 Pain in left knee: Secondary | ICD-10-CM | POA: Diagnosis not present

## 2017-01-15 DIAGNOSIS — G8929 Other chronic pain: Secondary | ICD-10-CM | POA: Diagnosis not present

## 2017-01-15 NOTE — Progress Notes (Signed)
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 Recent blood work from 11/14/2016 showed B-12 level of 450. CBC showed white count of 8.3, H&H of 9.8/34 with an MCV of 77.4 and platelet count of 553. Her hemoglobin has been ranging around 9 05/26/2016 and a platelet count has been in the 500s since then as well. BMP showed elevated blood sugar of 200 and mild hyponatremia. BUN and creatinine were within normal limits. Iron studies revealed decreased iron of less than 10, iron saturation of less than 4%, TIBC of 252. Urinalysis did not reveal any hematuria.  Patient lives alone and is independent of her ADLs but her mobility is limited because of her bilateral leg swelling. She takes Celebrex daily for arthritis. Denies any routine use of other NSAIDs. Denies any blood in her st no family history of colon cancerools.   Further blood work from 11/30/2016 was as follows: CBC showed hemoglobin of 10.4/32.2 with an MCV of 69.4 and a platelet count of 559. TSH was elevated at 11.7. Haptoglobin was elevated at 3 CN reticulocyte count was low at 0.9. H pylori stool antigen was negative. Celiac disease panel was within normal limits. Multiple myeloma panel revealed polyclonal gammopathy.  Patient was referred to GI but refused any endoscopy. She received 2 doses of feraheme    Interval history- reports fatigue and leg swelling. Denies other complaints. She has been started on levothyroxine a few weeks ago by her  pcp  ECOG PS- 2 Pain scale- 0   Review of systems- Review of Systems  Constitutional: Positive for malaise/fatigue. Negative for chills, fever and weight loss.  HENT: Negative for congestion, ear discharge and nosebleeds.   Eyes: Negative for blurred vision.  Respiratory: Negative for cough, hemoptysis, sputum production, shortness of breath and wheezing.   Cardiovascular: Positive for leg swelling. Negative for chest pain, palpitations, orthopnea and claudication.  Gastrointestinal: Negative for abdominal pain, blood in stool, constipation, diarrhea, heartburn, melena, nausea and vomiting.  Genitourinary: Negative for dysuria, flank pain, frequency, hematuria and urgency.  Musculoskeletal: Negative for back pain, joint pain and myalgias.  Skin: Negative for rash.  Neurological: Negative for dizziness, tingling, focal weakness, seizures, weakness and headaches.  Endo/Heme/Allergies: Does not bruise/bleed easily.  Psychiatric/Behavioral: Negative for depression and suicidal ideas. The patient does not have insomnia.      No Known Allergies   Past Medical History:  Diagnosis Date  . Arthritis      Past Surgical History:  Procedure Laterality Date  . APPENDECTOMY    . JOINT REPLACEMENT Right 2014   THR  . TONSILLECTOMY    . TOTAL HIP ARTHROPLASTY Left 08/31/2015   Procedure: TOTAL HIP ARTHROPLASTY;  Surgeon: Dereck Leep, MD;  Location: [facility] ORS;  Service: Orthopedics;  Laterality: Left;    Social History   Social History  . Marital status: Widowed    Spouse name: N/A  . Number of children: N/A  . Years of education: N/A   Occupational History  . Not on file.   Social History Main Topics  . Smoking status:  Never Smoker  . Smokeless tobacco: Never Used  . Alcohol use No  . Drug use: No  . Sexual activity: No   Other Topics Concern  . Not on file   Social History Narrative  . No narrative on file    Family History  Problem Relation Age of Onset  .  Pancreatic cancer Mother   . Rheum arthritis Father   . Congestive Heart Failure Father   . Pancreatic cancer Maternal Aunt   . Heart attack Maternal Grandfather   . Arthritis Paternal Grandmother   . Arthritis Paternal Grandfather      Current Outpatient Prescriptions:  .  acetaminophen (TYLENOL) 500 MG tablet, Take 500 mg by mouth every 6 (six) hours as needed., Disp: , Rfl:  .  aspirin EC 81 MG tablet, Take by mouth., Disp: , Rfl:  .  celecoxib (CELEBREX) 200 MG capsule, Take 200 mg by mouth 2 (two) times daily., Disp: , Rfl:  .  enoxaparin (LOVENOX) 30 MG/0.3ML injection, Inject 0.3 mLs (30 mg total) into the skin every 12 (twelve) hours., Disp: 28 Syringe, Rfl: 0 .  ferrous sulfate 325 (65 FE) MG EC tablet, Take by mouth., Disp: , Rfl:  .  furosemide (LASIX) 20 MG tablet, Take by mouth., Disp: , Rfl:  .  levothyroxine (SYNTHROID, LEVOTHROID) 50 MCG tablet, Take by mouth., Disp: , Rfl:  .  nitrofurantoin, macrocrystal-monohydrate, (MACROBID) 100 MG capsule, , Disp: , Rfl:  .  vitamin B-12 (CYANOCOBALAMIN) 1000 MCG tablet, Take 1,000 mcg by mouth daily., Disp: , Rfl:   Current Facility-Administered Medications:  .  betamethasone acetate-betamethasone sodium phosphate (CELESTONE) injection 3 mg, 3 mg, Intramuscular, Once, Edrick Kins, DPM  Physical exam:  Vitals:   01/11/17 1003  BP: (!) 109/54  Pulse: 80  Temp: 97.2 F (36.2 C)  TempSrc: Tympanic  Weight: 108 lb 6.4 oz (49.2 kg)  Height: '5\' 5"'  (1.651 m)   Physical Exam  Constitutional: She is oriented to person, place, and time.  She is thin but appears in no acute distress  HENT:  Head: Normocephalic and atraumatic.  Eyes: EOM are normal. Pupils are equal, round, and reactive to light.  Neck: Normal range of motion.  Cardiovascular: Normal rate, regular rhythm and normal heart sounds.   Pulmonary/Chest: Effort normal and breath sounds normal.  Abdominal: Soft. Bowel sounds are normal.  Musculoskeletal: She exhibits  edema.  Neurological: She is alert and oriented to person, place, and time.  Skin: Skin is warm and dry.     CMP Latest Ref Rng & Units 09/02/2015  Glucose 65 - 99 mg/dL 94  BUN 6 - 20 mg/dL 7  Creatinine 0.44 - 1.00 mg/dL 0.62  Sodium 135 - 145 mmol/L 135  Potassium 3.5 - 5.1 mmol/L 3.7  Chloride 101 - 111 mmol/L 101  CO2 22 - 32 mmol/L 27  Calcium 8.9 - 10.3 mg/dL 8.2(L)   CBC Latest Ref Rng & Units 01/11/2017  WBC 3.6 - 11.0 K/uL 7.3  Hemoglobin 12.0 - 16.0 g/dL 10.2(L)  Hematocrit 35.0 - 47.0 % 30.4(L)  Platelets 150 - 440 K/uL 540(H)     Assessment and plan- Patient is a 76 y.o. female referred to [place] for microcytic anemia  Anemia likely multifactorial due to iron deficiency, hypothyroidism and anemia of chronic disease. She has received 2 doses of feraheme last dose on 12/14/16. Also was just started on levothyroxine. Her H/H shows no significant improvement today but will repeat cbc and iron studies in 3  months and see if it is improved. See me in 6 months with labs   Visit Diagnosis 1. Iron deficiency anemia, unspecified iron deficiency anemia type   2. Hyperlipidemia, unspecified hyperlipidemia type      Dr. Randa Evens, MD, MPH [facility] Pager- [phone] 01/15/2017 7:47 AM

## 2017-01-17 DIAGNOSIS — R946 Abnormal results of thyroid function studies: Secondary | ICD-10-CM | POA: Diagnosis not present

## 2017-01-18 DIAGNOSIS — M25562 Pain in left knee: Secondary | ICD-10-CM | POA: Diagnosis not present

## 2017-01-18 DIAGNOSIS — M25561 Pain in right knee: Secondary | ICD-10-CM | POA: Diagnosis not present

## 2017-01-18 DIAGNOSIS — G8929 Other chronic pain: Secondary | ICD-10-CM | POA: Diagnosis not present

## 2017-01-21 DIAGNOSIS — D509 Iron deficiency anemia, unspecified: Secondary | ICD-10-CM | POA: Diagnosis not present

## 2017-01-21 DIAGNOSIS — E039 Hypothyroidism, unspecified: Secondary | ICD-10-CM | POA: Insufficient documentation

## 2017-01-21 DIAGNOSIS — M25561 Pain in right knee: Secondary | ICD-10-CM | POA: Diagnosis not present

## 2017-01-21 DIAGNOSIS — G8929 Other chronic pain: Secondary | ICD-10-CM | POA: Diagnosis not present

## 2017-01-21 DIAGNOSIS — M170 Bilateral primary osteoarthritis of knee: Secondary | ICD-10-CM | POA: Insufficient documentation

## 2017-01-21 DIAGNOSIS — M25562 Pain in left knee: Secondary | ICD-10-CM | POA: Diagnosis not present

## 2017-01-28 DIAGNOSIS — G8929 Other chronic pain: Secondary | ICD-10-CM | POA: Diagnosis not present

## 2017-01-28 DIAGNOSIS — M25561 Pain in right knee: Secondary | ICD-10-CM | POA: Diagnosis not present

## 2017-01-28 DIAGNOSIS — M25562 Pain in left knee: Secondary | ICD-10-CM | POA: Diagnosis not present

## 2017-01-30 DIAGNOSIS — M170 Bilateral primary osteoarthritis of knee: Secondary | ICD-10-CM | POA: Diagnosis not present

## 2017-02-01 DIAGNOSIS — G8929 Other chronic pain: Secondary | ICD-10-CM | POA: Diagnosis not present

## 2017-02-01 DIAGNOSIS — M25561 Pain in right knee: Secondary | ICD-10-CM | POA: Diagnosis not present

## 2017-02-01 DIAGNOSIS — M25562 Pain in left knee: Secondary | ICD-10-CM | POA: Diagnosis not present

## 2017-02-04 DIAGNOSIS — M25562 Pain in left knee: Secondary | ICD-10-CM | POA: Diagnosis not present

## 2017-02-04 DIAGNOSIS — G8929 Other chronic pain: Secondary | ICD-10-CM | POA: Diagnosis not present

## 2017-02-04 DIAGNOSIS — M25561 Pain in right knee: Secondary | ICD-10-CM | POA: Diagnosis not present

## 2017-02-07 DIAGNOSIS — M25561 Pain in right knee: Secondary | ICD-10-CM | POA: Diagnosis not present

## 2017-02-07 DIAGNOSIS — G8929 Other chronic pain: Secondary | ICD-10-CM | POA: Diagnosis not present

## 2017-02-07 DIAGNOSIS — M25562 Pain in left knee: Secondary | ICD-10-CM | POA: Diagnosis not present

## 2017-02-11 DIAGNOSIS — G8929 Other chronic pain: Secondary | ICD-10-CM | POA: Diagnosis not present

## 2017-02-11 DIAGNOSIS — M25562 Pain in left knee: Secondary | ICD-10-CM | POA: Diagnosis not present

## 2017-02-11 DIAGNOSIS — M25561 Pain in right knee: Secondary | ICD-10-CM | POA: Diagnosis not present

## 2017-02-14 DIAGNOSIS — M170 Bilateral primary osteoarthritis of knee: Secondary | ICD-10-CM | POA: Diagnosis not present

## 2017-02-14 DIAGNOSIS — M79641 Pain in right hand: Secondary | ICD-10-CM | POA: Diagnosis not present

## 2017-02-14 DIAGNOSIS — M79642 Pain in left hand: Secondary | ICD-10-CM | POA: Diagnosis not present

## 2017-02-14 DIAGNOSIS — Z96641 Presence of right artificial hip joint: Secondary | ICD-10-CM | POA: Diagnosis not present

## 2017-02-14 DIAGNOSIS — M12831 Other specific arthropathies, not elsewhere classified, right wrist: Secondary | ICD-10-CM | POA: Diagnosis not present

## 2017-02-14 DIAGNOSIS — M12841 Other specific arthropathies, not elsewhere classified, right hand: Secondary | ICD-10-CM | POA: Diagnosis not present

## 2017-02-15 DIAGNOSIS — G8929 Other chronic pain: Secondary | ICD-10-CM | POA: Diagnosis not present

## 2017-02-15 DIAGNOSIS — M25562 Pain in left knee: Secondary | ICD-10-CM | POA: Diagnosis not present

## 2017-02-15 DIAGNOSIS — M25561 Pain in right knee: Secondary | ICD-10-CM | POA: Diagnosis not present

## 2017-02-19 ENCOUNTER — Ambulatory Visit (INDEPENDENT_AMBULATORY_CARE_PROVIDER_SITE_OTHER): Payer: PPO | Admitting: Podiatry

## 2017-02-19 ENCOUNTER — Encounter: Payer: Self-pay | Admitting: Podiatry

## 2017-02-19 DIAGNOSIS — M19079 Primary osteoarthritis, unspecified ankle and foot: Secondary | ICD-10-CM | POA: Diagnosis not present

## 2017-02-19 DIAGNOSIS — M659 Synovitis and tenosynovitis, unspecified: Secondary | ICD-10-CM

## 2017-02-19 DIAGNOSIS — B351 Tinea unguium: Secondary | ICD-10-CM

## 2017-02-19 DIAGNOSIS — M79676 Pain in unspecified toe(s): Secondary | ICD-10-CM

## 2017-02-20 NOTE — Progress Notes (Signed)
   SUBJECTIVE Patient  presents to office today complaining of elongated, thickened nails. Pain while ambulating in shoes. Patient is unable to trim their own nails.   OBJECTIVE General Patient is awake, alert, and oriented x 3 and in no acute distress. Derm Skin is dry and supple bilateral. Negative open lesions or macerations. Remaining integument unremarkable. Nails are tender, long, thickened and dystrophic with subungual debris, consistent with onychomycosis, 1-5 bilateral. No signs of infection noted. Vasc  DP and PT pedal pulses palpable bilaterally. Temperature gradient within normal limits.  Neuro Epicritic and protective threshold sensation diminished bilaterally.  Musculoskeletal Exam No symptomatic pedal deformities noted bilateral. Muscular strength within normal limits.  ASSESSMENT 1. Onychodystrophic nails 1-5 bilateral with hyperkeratosis of nails.  2. Onychomycosis of nail due to dermatophyte bilateral 3. Bilateral ankle synovitis/DJD  PLAN OF CARE 1. Patient evaluated today.  2. Instructed to maintain good pedal hygiene and foot care.  3. Mechanical debridement of nails 1-5 bilaterally performed using a nail nipper. Filed with dremel without incident.  4. Injection of 0.5 mLs Celestone Soluspan injected into bilateral ankles. 5. Return to clinic in 3 mos.    Felecia Shelling, DPM [facility]  Dr. Felecia Shelling, DPM    [address]                                        [place], [place] [address]                Office [phone]  Fax [phone]

## 2017-02-21 DIAGNOSIS — M25562 Pain in left knee: Secondary | ICD-10-CM | POA: Diagnosis not present

## 2017-02-21 DIAGNOSIS — G8929 Other chronic pain: Secondary | ICD-10-CM | POA: Diagnosis not present

## 2017-02-21 DIAGNOSIS — M25561 Pain in right knee: Secondary | ICD-10-CM | POA: Diagnosis not present

## 2017-02-23 MED ORDER — BETAMETHASONE SOD PHOS & ACET 6 (3-3) MG/ML IJ SUSP: 3.0000 mg | Freq: Once | INTRAMUSCULAR | Status: DC

## 2017-02-26 DIAGNOSIS — G8929 Other chronic pain: Secondary | ICD-10-CM | POA: Diagnosis not present

## 2017-02-26 DIAGNOSIS — M25561 Pain in right knee: Secondary | ICD-10-CM | POA: Diagnosis not present

## 2017-02-26 DIAGNOSIS — M25562 Pain in left knee: Secondary | ICD-10-CM | POA: Diagnosis not present

## 2017-02-28 DIAGNOSIS — R634 Abnormal weight loss: Secondary | ICD-10-CM | POA: Insufficient documentation

## 2017-02-28 DIAGNOSIS — Z96641 Presence of right artificial hip joint: Secondary | ICD-10-CM | POA: Diagnosis not present

## 2017-02-28 DIAGNOSIS — M79642 Pain in left hand: Secondary | ICD-10-CM | POA: Diagnosis not present

## 2017-02-28 DIAGNOSIS — M79641 Pain in right hand: Secondary | ICD-10-CM | POA: Diagnosis not present

## 2017-02-28 DIAGNOSIS — M069 Rheumatoid arthritis, unspecified: Secondary | ICD-10-CM | POA: Insufficient documentation

## 2017-02-28 DIAGNOSIS — M170 Bilateral primary osteoarthritis of knee: Secondary | ICD-10-CM | POA: Diagnosis not present

## 2017-02-28 DIAGNOSIS — Z79899 Other long term (current) drug therapy: Secondary | ICD-10-CM | POA: Diagnosis not present

## 2017-02-28 DIAGNOSIS — Z96642 Presence of left artificial hip joint: Secondary | ICD-10-CM | POA: Diagnosis not present

## 2017-02-28 DIAGNOSIS — D509 Iron deficiency anemia, unspecified: Secondary | ICD-10-CM | POA: Diagnosis not present

## 2017-03-01 DIAGNOSIS — M25562 Pain in left knee: Secondary | ICD-10-CM | POA: Diagnosis not present

## 2017-03-01 DIAGNOSIS — G8929 Other chronic pain: Secondary | ICD-10-CM | POA: Diagnosis not present

## 2017-03-01 DIAGNOSIS — M25561 Pain in right knee: Secondary | ICD-10-CM | POA: Diagnosis not present

## 2017-03-04 DIAGNOSIS — M25561 Pain in right knee: Secondary | ICD-10-CM | POA: Diagnosis not present

## 2017-03-04 DIAGNOSIS — G8929 Other chronic pain: Secondary | ICD-10-CM | POA: Diagnosis not present

## 2017-03-04 DIAGNOSIS — R634 Abnormal weight loss: Secondary | ICD-10-CM | POA: Diagnosis not present

## 2017-03-04 DIAGNOSIS — M25562 Pain in left knee: Secondary | ICD-10-CM | POA: Diagnosis not present

## 2017-03-04 DIAGNOSIS — R7982 Elevated C-reactive protein (CRP): Secondary | ICD-10-CM | POA: Diagnosis not present

## 2017-03-05 ENCOUNTER — Other Ambulatory Visit: Payer: Self-pay | Admitting: Internal Medicine

## 2017-03-05 DIAGNOSIS — R634 Abnormal weight loss: Secondary | ICD-10-CM | POA: Diagnosis not present

## 2017-03-07 DIAGNOSIS — E039 Hypothyroidism, unspecified: Secondary | ICD-10-CM | POA: Diagnosis not present

## 2017-03-07 DIAGNOSIS — D509 Iron deficiency anemia, unspecified: Secondary | ICD-10-CM | POA: Diagnosis not present

## 2017-03-08 DIAGNOSIS — G8929 Other chronic pain: Secondary | ICD-10-CM | POA: Diagnosis not present

## 2017-03-08 DIAGNOSIS — M25562 Pain in left knee: Secondary | ICD-10-CM | POA: Diagnosis not present

## 2017-03-08 DIAGNOSIS — M25561 Pain in right knee: Secondary | ICD-10-CM | POA: Diagnosis not present

## 2017-03-11 ENCOUNTER — Ambulatory Visit
Admission: RE | Admit: 2017-03-11 | Discharge: 2017-03-11 | Disposition: A | Discharge status: Home / Self Care | Payer: PPO | Source: Ambulatory Visit | Attending: Internal Medicine | Admitting: Internal Medicine

## 2017-03-11 DIAGNOSIS — K769 Liver disease, unspecified: Secondary | ICD-10-CM | POA: Diagnosis not present

## 2017-03-11 DIAGNOSIS — D500 Iron deficiency anemia secondary to blood loss (chronic): Secondary | ICD-10-CM | POA: Insufficient documentation

## 2017-03-11 DIAGNOSIS — I700 Atherosclerosis of aorta: Secondary | ICD-10-CM | POA: Diagnosis not present

## 2017-03-11 DIAGNOSIS — R634 Abnormal weight loss: Secondary | ICD-10-CM | POA: Diagnosis not present

## 2017-03-11 DIAGNOSIS — Z96643 Presence of artificial hip joint, bilateral: Secondary | ICD-10-CM | POA: Diagnosis not present

## 2017-03-11 MED ORDER — IOPAMIDOL (ISOVUE-300) INJECTION 61%
75.0000 mL | Freq: Once | INTRAVENOUS | Status: AC | PRN
  Administered 2017-03-11: 75 mL via INTRAVENOUS

## 2017-03-12 DIAGNOSIS — M25561 Pain in right knee: Secondary | ICD-10-CM | POA: Diagnosis not present

## 2017-03-12 DIAGNOSIS — M25562 Pain in left knee: Secondary | ICD-10-CM | POA: Diagnosis not present

## 2017-03-12 DIAGNOSIS — G8929 Other chronic pain: Secondary | ICD-10-CM | POA: Diagnosis not present

## 2017-03-18 DIAGNOSIS — G8929 Other chronic pain: Secondary | ICD-10-CM | POA: Diagnosis not present

## 2017-03-18 DIAGNOSIS — M25561 Pain in right knee: Secondary | ICD-10-CM | POA: Diagnosis not present

## 2017-03-18 DIAGNOSIS — M25562 Pain in left knee: Secondary | ICD-10-CM | POA: Diagnosis not present

## 2017-03-21 DIAGNOSIS — G8929 Other chronic pain: Secondary | ICD-10-CM | POA: Diagnosis not present

## 2017-03-21 DIAGNOSIS — M25561 Pain in right knee: Secondary | ICD-10-CM | POA: Diagnosis not present

## 2017-03-21 DIAGNOSIS — M25562 Pain in left knee: Secondary | ICD-10-CM | POA: Diagnosis not present

## 2017-03-28 DIAGNOSIS — M25562 Pain in left knee: Secondary | ICD-10-CM | POA: Diagnosis not present

## 2017-03-28 DIAGNOSIS — M25561 Pain in right knee: Secondary | ICD-10-CM | POA: Diagnosis not present

## 2017-03-28 DIAGNOSIS — G8929 Other chronic pain: Secondary | ICD-10-CM | POA: Diagnosis not present

## 2017-03-29 DIAGNOSIS — M059 Rheumatoid arthritis with rheumatoid factor, unspecified: Secondary | ICD-10-CM | POA: Diagnosis not present

## 2017-03-29 DIAGNOSIS — R634 Abnormal weight loss: Secondary | ICD-10-CM | POA: Diagnosis not present

## 2017-03-29 DIAGNOSIS — Z79899 Other long term (current) drug therapy: Secondary | ICD-10-CM | POA: Diagnosis not present

## 2017-03-29 DIAGNOSIS — D509 Iron deficiency anemia, unspecified: Secondary | ICD-10-CM | POA: Diagnosis not present

## 2017-03-29 DIAGNOSIS — M0579 Rheumatoid arthritis with rheumatoid factor of multiple sites without organ or systems involvement: Secondary | ICD-10-CM | POA: Diagnosis not present

## 2017-03-29 DIAGNOSIS — M170 Bilateral primary osteoarthritis of knee: Secondary | ICD-10-CM | POA: Diagnosis not present

## 2017-04-15 ENCOUNTER — Inpatient Hospital Stay: Payer: PPO | Attending: Oncology

## 2017-04-15 DIAGNOSIS — D890 Polyclonal hypergammaglobulinemia: Secondary | ICD-10-CM | POA: Diagnosis not present

## 2017-04-15 DIAGNOSIS — D509 Iron deficiency anemia, unspecified: Secondary | ICD-10-CM | POA: Diagnosis not present

## 2017-04-15 DIAGNOSIS — E871 Hypo-osmolality and hyponatremia: Secondary | ICD-10-CM | POA: Diagnosis not present

## 2017-04-15 DIAGNOSIS — Z7982 Long term (current) use of aspirin: Secondary | ICD-10-CM | POA: Insufficient documentation

## 2017-04-15 DIAGNOSIS — Z79899 Other long term (current) drug therapy: Secondary | ICD-10-CM | POA: Diagnosis not present

## 2017-04-15 DIAGNOSIS — E785 Hyperlipidemia, unspecified: Secondary | ICD-10-CM | POA: Diagnosis not present

## 2017-04-15 DIAGNOSIS — M1990 Unspecified osteoarthritis, unspecified site: Secondary | ICD-10-CM | POA: Insufficient documentation

## 2017-04-15 DIAGNOSIS — Z96642 Presence of left artificial hip joint: Secondary | ICD-10-CM | POA: Diagnosis not present

## 2017-04-15 LAB — FERRITIN: FERRITIN: 464 ng/mL — AB (ref 11–307)

## 2017-04-15 LAB — CBC WITH DIFFERENTIAL/PLATELET
BASOS PCT: 0 %
Basophils Absolute: 0 10*3/uL (ref 0–0.1)
EOS ABS: 0 10*3/uL (ref 0–0.7)
EOS PCT: 0 %
HCT: 32.9 % — ABNORMAL LOW (ref 35.0–47.0)
Hemoglobin: 10.8 g/dL — ABNORMAL LOW (ref 12.0–16.0)
LYMPHS ABS: 0.5 10*3/uL — AB (ref 1.0–3.6)
Lymphocytes Relative: 4 %
MCH: 28.9 pg (ref 26.0–34.0)
MCHC: 32.9 g/dL (ref 32.0–36.0)
MCV: 87.9 fL (ref 80.0–100.0)
MONOS PCT: 4 %
Monocytes Absolute: 0.5 10*3/uL (ref 0.2–0.9)
Neutro Abs: 11.4 10*3/uL — ABNORMAL HIGH (ref 1.4–6.5)
Neutrophils Relative %: 92 %
PLATELETS: 497 10*3/uL — AB (ref 150–440)
RBC: 3.74 MIL/uL — ABNORMAL LOW (ref 3.80–5.20)
RDW: 20.7 % — AB (ref 11.5–14.5)
WBC: 12.5 10*3/uL — AB (ref 3.6–11.0)

## 2017-04-15 LAB — IRON AND TIBC
IRON: 21 ug/dL — AB (ref 28–170)
Saturation Ratios: 10 % — ABNORMAL LOW (ref 10.4–31.8)
TIBC: 218 ug/dL — ABNORMAL LOW (ref 250–450)
UIBC: 197 ug/dL

## 2017-04-26 DIAGNOSIS — M0579 Rheumatoid arthritis with rheumatoid factor of multiple sites without organ or systems involvement: Secondary | ICD-10-CM | POA: Diagnosis not present

## 2017-04-26 DIAGNOSIS — Z79899 Other long term (current) drug therapy: Secondary | ICD-10-CM | POA: Diagnosis not present

## 2017-05-06 ENCOUNTER — Encounter: Payer: Self-pay | Admitting: Gastroenterology

## 2017-05-06 ENCOUNTER — Ambulatory Visit (INDEPENDENT_AMBULATORY_CARE_PROVIDER_SITE_OTHER): Payer: PPO | Admitting: Gastroenterology

## 2017-05-06 VITALS — BP 129/64 | HR 80 | Temp 98.4°F | Ht 65.0 in | Wt 108.2 lb

## 2017-05-06 DIAGNOSIS — R634 Abnormal weight loss: Secondary | ICD-10-CM | POA: Diagnosis not present

## 2017-05-06 DIAGNOSIS — Z0000 Encounter for general adult medical examination without abnormal findings: Secondary | ICD-10-CM | POA: Diagnosis not present

## 2017-05-06 DIAGNOSIS — M0579 Rheumatoid arthritis with rheumatoid factor of multiple sites without organ or systems involvement: Secondary | ICD-10-CM | POA: Diagnosis not present

## 2017-05-06 DIAGNOSIS — D509 Iron deficiency anemia, unspecified: Secondary | ICD-10-CM | POA: Diagnosis not present

## 2017-05-06 DIAGNOSIS — E039 Hypothyroidism, unspecified: Secondary | ICD-10-CM | POA: Diagnosis not present

## 2017-05-06 DIAGNOSIS — E785 Hyperlipidemia, unspecified: Secondary | ICD-10-CM | POA: Diagnosis not present

## 2017-05-06 NOTE — Progress Notes (Signed)
Jonathon Bellows MD, MRCP(U.K) [address]  [place], [place] [address]  Main: [phone]  Fax: [phone]   Gastroenterology Consultation  Referring Provider:     Marlowe Sax* Primary Care Physician:  [name], MD Primary Gastroenterologist:  Dr. Jonathon Bellows  Reason for Consultation:     Unexplained weight loss.         HPI:   [name] is a 76 y.o. y/o female referred for consultation & management  by Dr. [name], MD.     She has been referred for unintentional weight loss. She is on thyroid replacement for hypothyroidism. Methotrexate for RA. Iron studies show she is iron deficient . Hb 10.8 , mcv 87 Negative TTG antibody for celiac disease  . CT abdomen on 02/2017 showed possible fatty infiltration of liver , large stool burden .    She was last seen in 11/2016 at my office for iron deficiency anemia. Since her last visit she has lost 11 lbs . Since 11/2016 - her Hb has remained the same. She takes 2 pills of iron a day    Past Medical History:  Diagnosis Date  . Arthritis     Past Surgical History:  Procedure Laterality Date  . APPENDECTOMY    . JOINT REPLACEMENT Right 2014   THR  . TONSILLECTOMY    . TOTAL HIP ARTHROPLASTY Left 08/31/2015   Procedure: TOTAL HIP ARTHROPLASTY;  Surgeon: Dereck Leep, MD;  Location: [facility] ORS;  Service: Orthopedics;  Laterality: Left;    Prior to Admission medications   Medication Sig Start Date End Date Taking? Authorizing Provider  Diclofenac Sodium 3 % GEL Apply topically. 01/21/17 01/21/18 Yes [provider]  folic acid (FOLVITE) 1 MG tablet Take by mouth. 03/04/17 03/04/18 Yes [provider]  methotrexate (RHEUMATREX) 2.5 MG tablet Take by mouth. 03/29/17  Yes [provider]  acetaminophen (TYLENOL) 500 MG tablet Take 500 mg by mouth every 6 (six) hours as needed.    [provider]  aspirin EC 81 MG tablet Take by mouth.    [provider]    celecoxib (CELEBREX) 200 MG capsule Take 200 mg by mouth 2 (two) times daily.    [provider]  diclofenac sodium (VOLTAREN) 1 % GEL  04/17/17   [provider]  enoxaparin (LOVENOX) 30 MG/0.3ML injection Inject 0.3 mLs (30 mg total) into the skin every 12 (twelve) hours. 09/03/15   Lattie Corns, PA-C  furosemide (LASIX) 20 MG tablet Take by mouth. 11/02/16 11/02/17  [provider]  levothyroxine (SYNTHROID, LEVOTHROID) 50 MCG tablet Take by mouth. 12/06/16 12/06/17  [provider]  levothyroxine (SYNTHROID, LEVOTHROID) 75 MCG tablet  04/13/17   [provider]  nitrofurantoin, macrocrystal-monohydrate, (MACROBID) 100 MG capsule  11/15/16   [provider]  predniSONE (DELTASONE) 5 MG tablet  05/01/17   [provider]  vitamin B-12 (CYANOCOBALAMIN) 1000 MCG tablet Take 1,000 mcg by mouth daily.    [provider]    Family History  Problem Relation Age of Onset  . Pancreatic cancer Mother   . Rheum arthritis Father   . Congestive Heart Failure Father   . Pancreatic cancer Maternal Aunt   . Heart attack Maternal Grandfather   . Arthritis Paternal Grandmother   . Arthritis Paternal Grandfather      Social History  Substance Use Topics  . Smoking status: Never Smoker  . Smokeless tobacco: Never Used  . Alcohol use No  Allergies as of 05/06/2017  . (No Known Allergies)    Review of Systems:    All systems reviewed and negative except where noted in HPI.   Physical Exam:  There were no vitals taken for this visit. No LMP recorded. Patient is postmenopausal. Psych:  Alert and cooperative. Normal mood and affect. General:   Alert,  Well-developed, well-nourished, pleasant and cooperative in NAD Head:  Normocephalic and atraumatic. Eyes:  Sclera clear, no icterus.   Conjunctiva pink. Ears:  Normal auditory acuity. Nose:  No deformity, discharge, or lesions. Mouth:  No deformity or lesions,oropharynx  pink & moist. Neck:  Supple; no masses or thyromegaly. Lungs:  Respirations even and unlabored.  Clear throughout to auscultation.   No wheezes, crackles, or rhonchi. No acute distress. Heart:  Regular rate and rhythm; no murmurs, clicks, rubs, or gallops. Abdomen:  Normal bowel sounds.  No bruits.  Soft, non-tender and non-distended without masses, hepatosplenomegaly or hernias noted.  No guarding or rebound tenderness.    Neurologic:  Alert and oriented x3;  grossly normal neurologically. Skin:  Intact without significant lesions or rashes. No jaundice. Lymph Nodes:  No significant cervical adenopathy. Psych:  Alert and cooperative. Normal mood and affect.  Imaging Studies: No results found.  Assessment and Plan:   [name] is a 76 y.o. y/o female has been referred for unexplained weight loss. She was last seen by me in 11/2016 for iron deficiency anemia without any overt blood loss. She is on iron, lost 11 lbs weight over the last few months. I strongly suggested to undergo an EGD+colonoscopy to r/o any neoplasm . She was adamant that she absolutely didn't want any procedures on her . Offered CT colonography although insurance did not pay for it when enquired last time. She is not keen on that either.   Follow up as needed .   Dr Jonathon Bellows MD,MRCP(U.K)

## 2017-05-24 DIAGNOSIS — R7989 Other specified abnormal findings of blood chemistry: Secondary | ICD-10-CM | POA: Diagnosis not present

## 2017-05-24 DIAGNOSIS — Z79899 Other long term (current) drug therapy: Secondary | ICD-10-CM | POA: Diagnosis not present

## 2017-05-24 DIAGNOSIS — M0579 Rheumatoid arthritis with rheumatoid factor of multiple sites without organ or systems involvement: Secondary | ICD-10-CM | POA: Diagnosis not present

## 2017-05-24 DIAGNOSIS — Z13220 Encounter for screening for lipoid disorders: Secondary | ICD-10-CM | POA: Diagnosis not present

## 2017-05-28 ENCOUNTER — Ambulatory Visit: Payer: PPO

## 2017-05-28 ENCOUNTER — Ambulatory Visit (INDEPENDENT_AMBULATORY_CARE_PROVIDER_SITE_OTHER): Payer: PPO | Admitting: Podiatry

## 2017-05-28 DIAGNOSIS — M79676 Pain in unspecified toe(s): Secondary | ICD-10-CM

## 2017-05-28 DIAGNOSIS — B351 Tinea unguium: Secondary | ICD-10-CM

## 2017-05-31 DIAGNOSIS — D509 Iron deficiency anemia, unspecified: Secondary | ICD-10-CM | POA: Diagnosis not present

## 2017-05-31 DIAGNOSIS — E785 Hyperlipidemia, unspecified: Secondary | ICD-10-CM | POA: Diagnosis not present

## 2017-05-31 DIAGNOSIS — R634 Abnormal weight loss: Secondary | ICD-10-CM | POA: Diagnosis not present

## 2017-05-31 DIAGNOSIS — Z96641 Presence of right artificial hip joint: Secondary | ICD-10-CM | POA: Diagnosis not present

## 2017-05-31 DIAGNOSIS — Z79899 Other long term (current) drug therapy: Secondary | ICD-10-CM | POA: Diagnosis not present

## 2017-05-31 DIAGNOSIS — M0579 Rheumatoid arthritis with rheumatoid factor of multiple sites without organ or systems involvement: Secondary | ICD-10-CM | POA: Diagnosis not present

## 2017-05-31 DIAGNOSIS — M170 Bilateral primary osteoarthritis of knee: Secondary | ICD-10-CM | POA: Diagnosis not present

## 2017-05-31 DIAGNOSIS — M059 Rheumatoid arthritis with rheumatoid factor, unspecified: Secondary | ICD-10-CM | POA: Diagnosis not present

## 2017-05-31 NOTE — Progress Notes (Signed)
   SUBJECTIVE Patient presents to office today complaining of elongated, thickened nails. Pain while ambulating in shoes. Patient is unable to trim their own nails.   Past Medical History:  Diagnosis Date  . Arthritis     OBJECTIVE General Patient is awake, alert, and oriented x 3 and in no acute distress. Derm Skin is dry and supple bilateral. Negative open lesions or macerations. Remaining integument unremarkable. Nails are tender, long, thickened and dystrophic with subungual debris, consistent with onychomycosis, 1-5 bilateral. No signs of infection noted. Vasc  DP and PT pedal pulses palpable bilaterally. Temperature gradient within normal limits.  Neuro Epicritic and protective threshold sensation diminished bilaterally.  Musculoskeletal Exam No symptomatic pedal deformities noted bilateral. Muscular strength within normal limits.  ASSESSMENT 1. Onychodystrophic nails 1-5 bilateral with hyperkeratosis of nails.  2. Onychomycosis of nail due to dermatophyte bilateral 3. Pain in foot bilateral  PLAN OF CARE 1. Patient evaluated today.  2. Instructed to maintain good pedal hygiene and foot care.  3. Mechanical debridement of nails 1-5 bilaterally performed using a nail nipper. Filed with dremel without incident.  4. Return to clinic in 3 mos.    Felecia Shelling, DPM [facility]  Dr. Felecia Shelling, DPM    [address]                                        [place], [place] [address]                Office [phone]  Fax [phone]

## 2017-07-14 IMAGING — CR DG HIP (WITH OR WITHOUT PELVIS) 1V PORT*L*
1 series · 3 of 3 positions shown · non-contrast
Comparison: Right hip radiographs 07/01/2013.

CLINICAL DATA: Postop left total hip arthroplasty.

EXAM:
DG HIP (WITH OR WITHOUT PELVIS) 1V PORT LEFT

[Series 1: ap · 0.17mm/px · 3 of 3 slices shown]
[im 1/3]
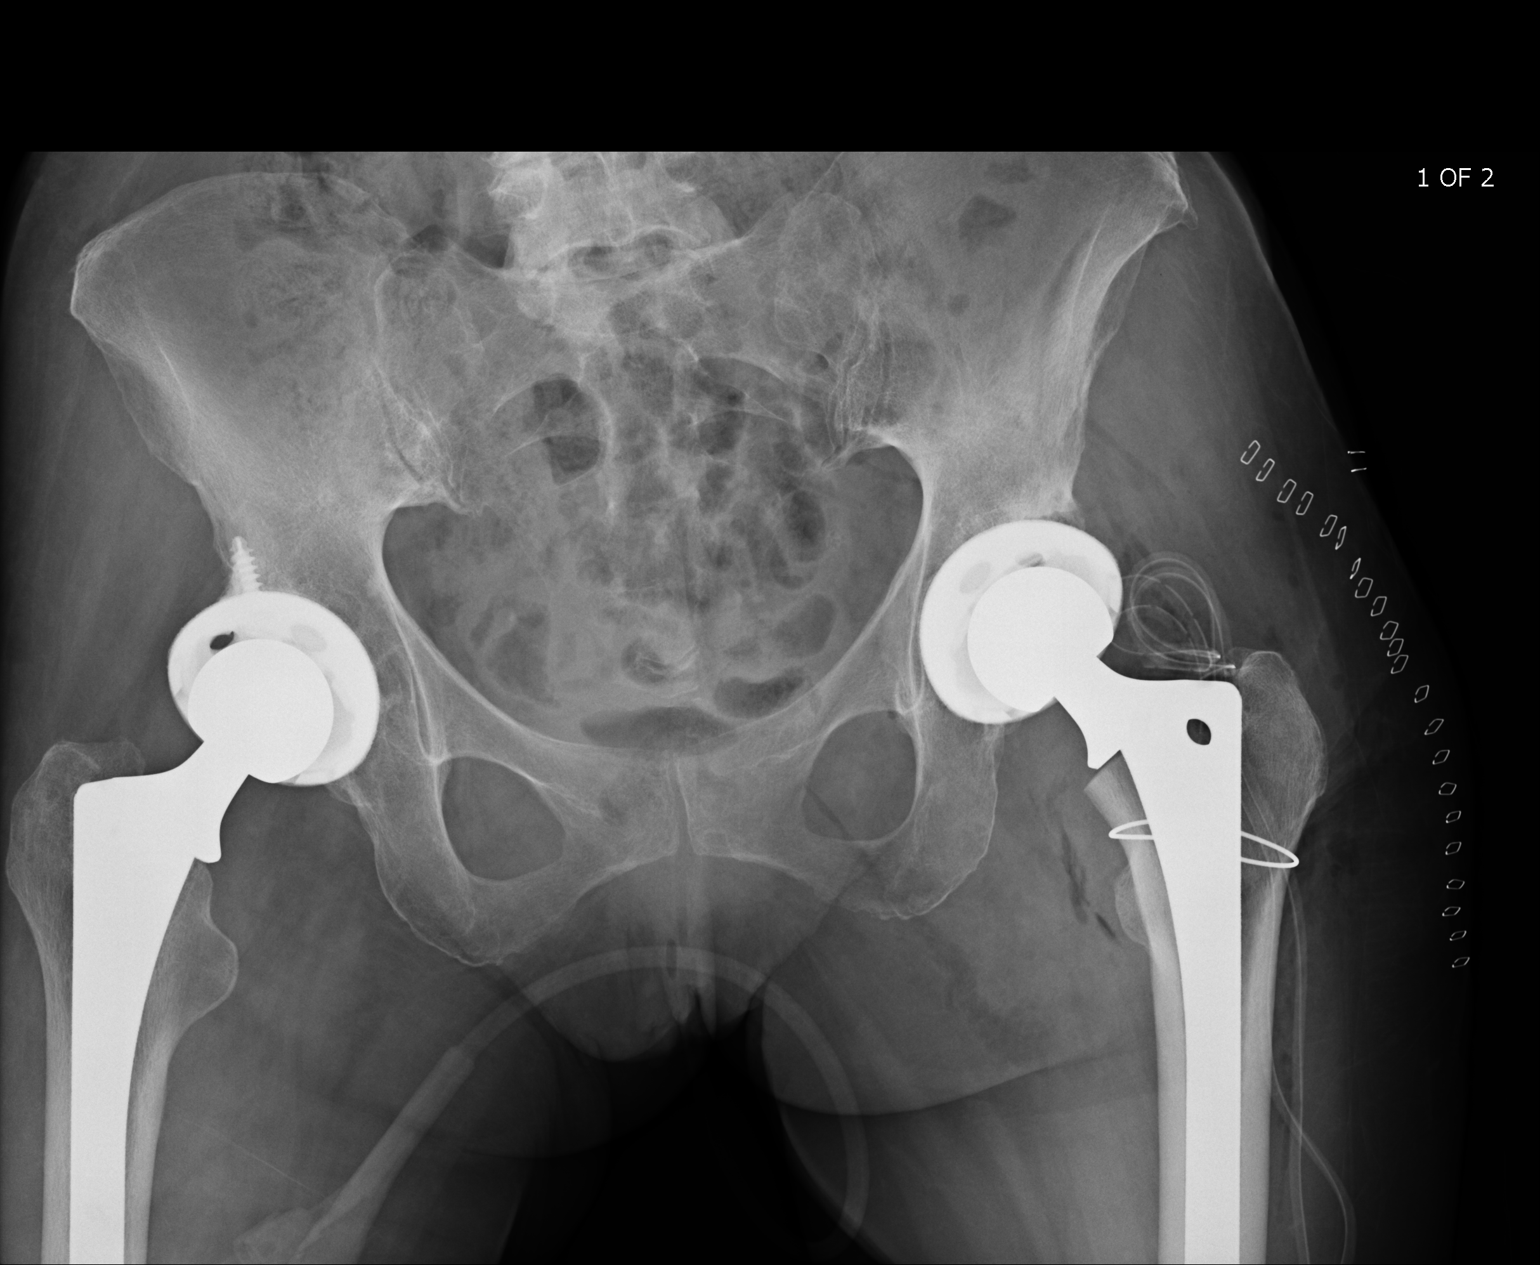
[im 2/3]
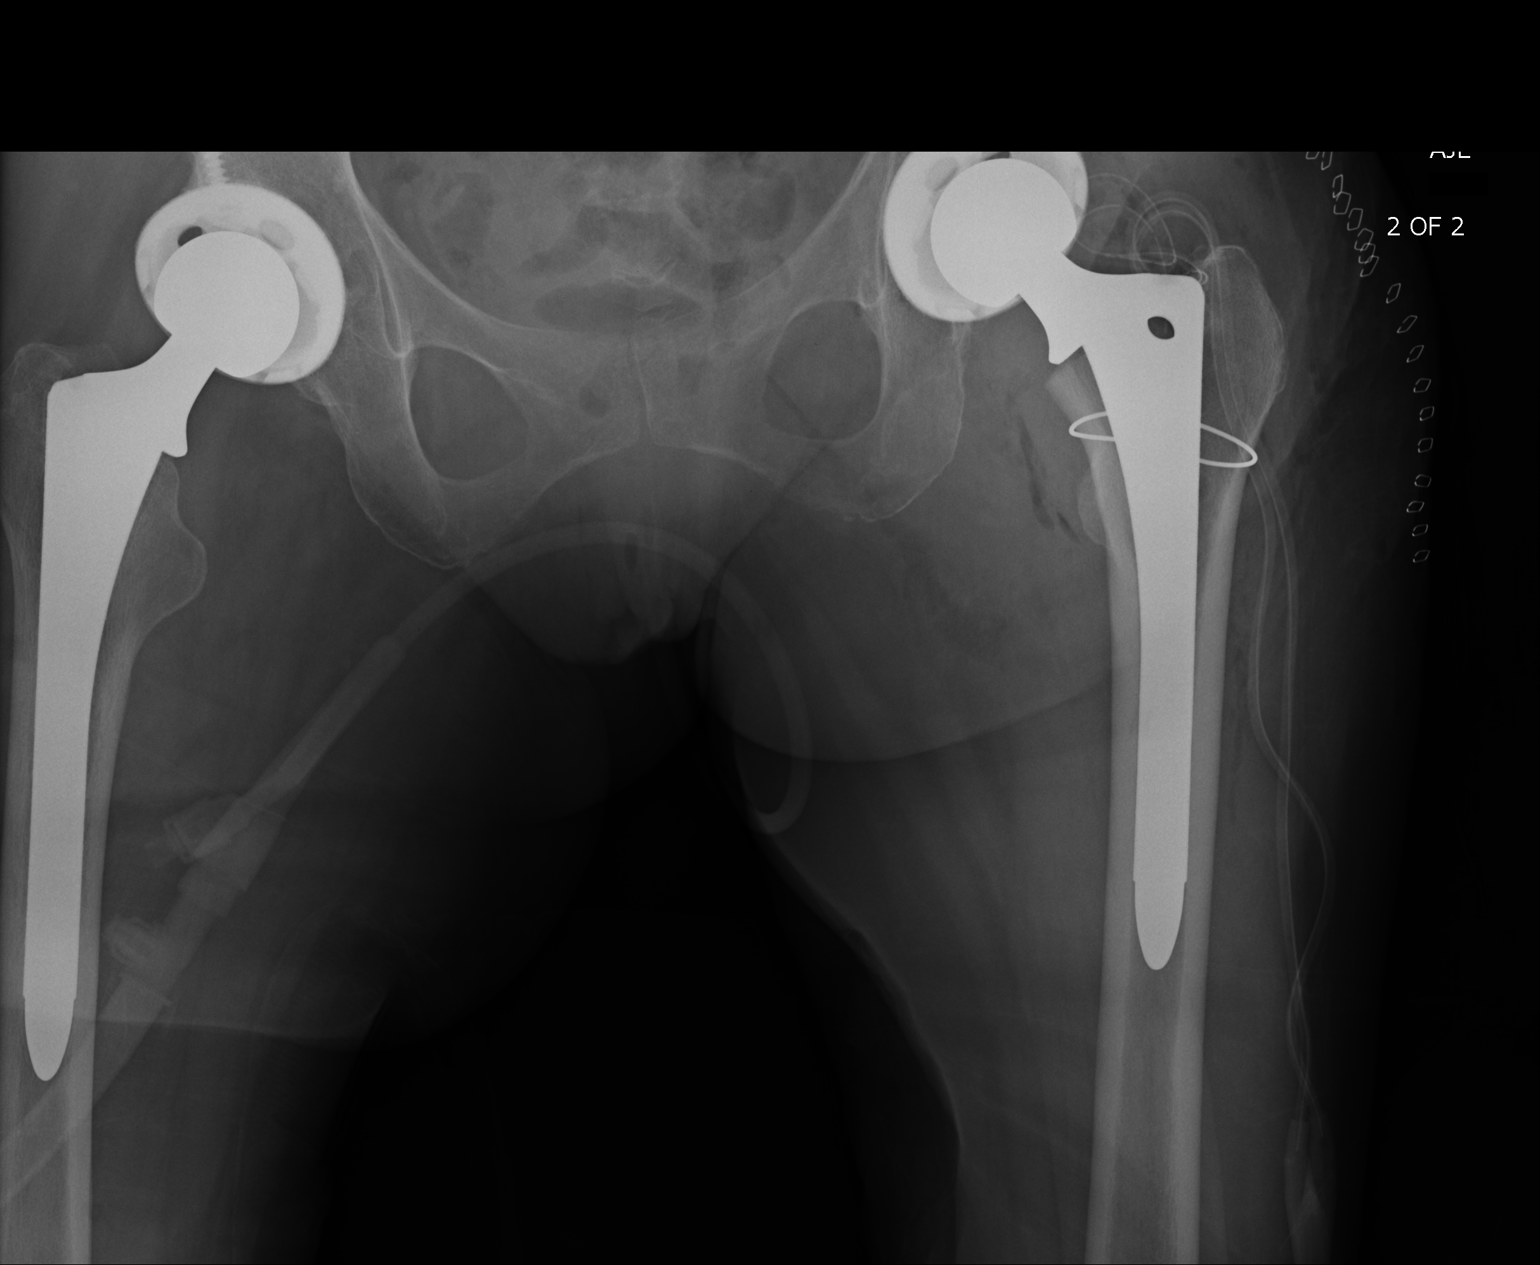
[im 3/3]
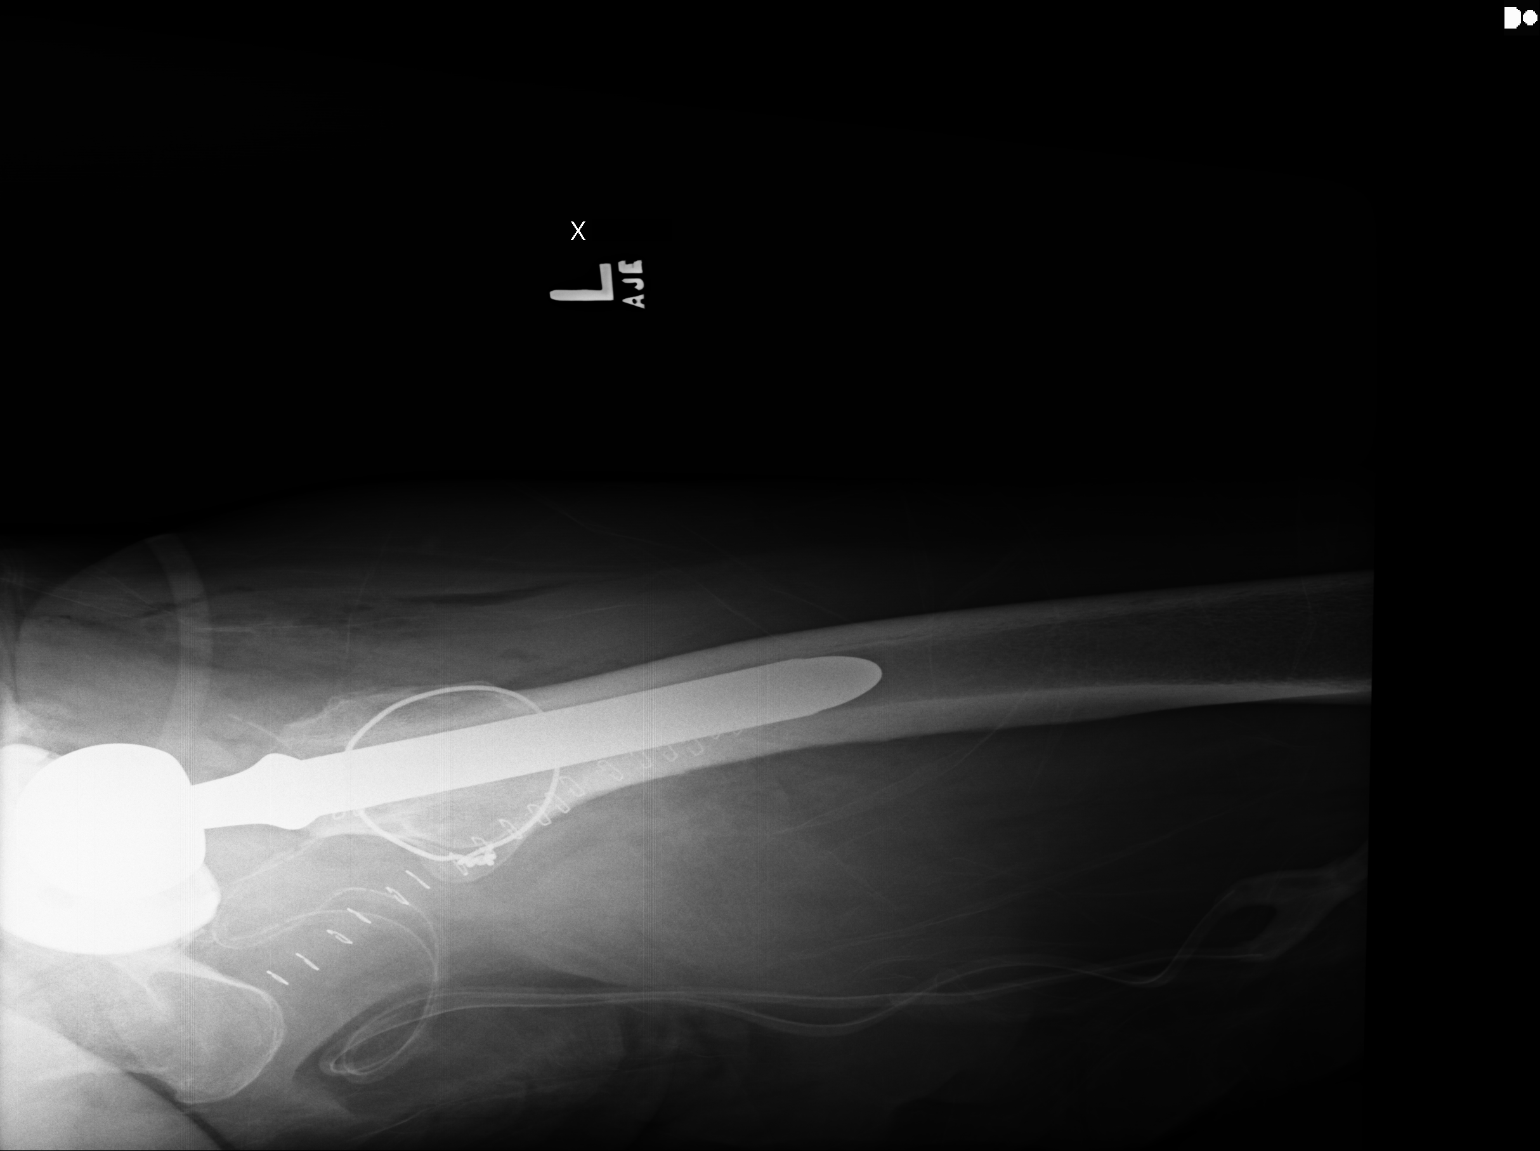

[3 of 3 positions shown; findings below may reference images not displayed]

FINDINGS: Status post left total hip arthroplasty. There is a proximal left
femoral cerclage wire. No underlying femoral fracture identified.
There is a small amount of gas in the soft tissue surrounding the
left hip. A surgical drain and skin staples are in place. Previous
right total hip arthroplasty has a stable appearance.
IMPRESSION: No demonstrated complication following left total hip arthroplasty.

## 2017-07-16 ENCOUNTER — Inpatient Hospital Stay (HOSPITAL_BASED_OUTPATIENT_CLINIC_OR_DEPARTMENT_OTHER): Payer: PPO | Admitting: Oncology

## 2017-07-16 ENCOUNTER — Telehealth: Payer: Self-pay | Admitting: *Deleted

## 2017-07-16 ENCOUNTER — Inpatient Hospital Stay: Payer: PPO | Attending: Oncology

## 2017-07-16 ENCOUNTER — Other Ambulatory Visit: Payer: Self-pay | Admitting: Oncology

## 2017-07-16 ENCOUNTER — Encounter: Payer: Self-pay | Admitting: Oncology

## 2017-07-16 VITALS — BP 130/71 | HR 94 | Temp 98.0°F | Resp 14 | Wt 111.0 lb

## 2017-07-16 DIAGNOSIS — M1990 Unspecified osteoarthritis, unspecified site: Secondary | ICD-10-CM | POA: Diagnosis not present

## 2017-07-16 DIAGNOSIS — D890 Polyclonal hypergammaglobulinemia: Secondary | ICD-10-CM | POA: Diagnosis not present

## 2017-07-16 DIAGNOSIS — E871 Hypo-osmolality and hyponatremia: Secondary | ICD-10-CM | POA: Diagnosis not present

## 2017-07-16 DIAGNOSIS — D509 Iron deficiency anemia, unspecified: Secondary | ICD-10-CM | POA: Diagnosis not present

## 2017-07-16 DIAGNOSIS — R634 Abnormal weight loss: Secondary | ICD-10-CM | POA: Insufficient documentation

## 2017-07-16 DIAGNOSIS — Z96642 Presence of left artificial hip joint: Secondary | ICD-10-CM | POA: Diagnosis not present

## 2017-07-16 DIAGNOSIS — Z79899 Other long term (current) drug therapy: Secondary | ICD-10-CM | POA: Diagnosis not present

## 2017-07-16 DIAGNOSIS — D638 Anemia in other chronic diseases classified elsewhere: Secondary | ICD-10-CM

## 2017-07-16 DIAGNOSIS — E785 Hyperlipidemia, unspecified: Secondary | ICD-10-CM | POA: Insufficient documentation

## 2017-07-16 LAB — CBC WITH DIFFERENTIAL/PLATELET
BASOS PCT: 0 %
Basophils Absolute: 0 10*3/uL (ref 0–0.1)
EOS PCT: 0 %
Eosinophils Absolute: 0 10*3/uL (ref 0–0.7)
HEMATOCRIT: 36.6 % (ref 35.0–47.0)
Hemoglobin: 12.1 g/dL (ref 12.0–16.0)
Lymphocytes Relative: 4 %
Lymphs Abs: 0.4 10*3/uL — ABNORMAL LOW (ref 1.0–3.6)
MCH: 30.2 pg (ref 26.0–34.0)
MCHC: 33 g/dL (ref 32.0–36.0)
MCV: 91.7 fL (ref 80.0–100.0)
MONO ABS: 0.6 10*3/uL (ref 0.2–0.9)
MONOS PCT: 5 %
NEUTROS ABS: 9.5 10*3/uL — AB (ref 1.4–6.5)
Neutrophils Relative %: 91 %
PLATELETS: 491 10*3/uL — AB (ref 150–440)
RBC: 3.99 MIL/uL (ref 3.80–5.20)
RDW: 16 % — AB (ref 11.5–14.5)
WBC: 10.5 10*3/uL (ref 3.6–11.0)

## 2017-07-16 LAB — TSH: TSH: 1.513 u[IU]/mL (ref 0.350–4.500)

## 2017-07-16 LAB — IRON AND TIBC
IRON: 13 ug/dL — AB (ref 28–170)
SATURATION RATIOS: 7 % — AB (ref 10.4–31.8)
TIBC: 198 ug/dL — AB (ref 250–450)
UIBC: 185 ug/dL

## 2017-07-16 LAB — FERRITIN: Ferritin: 475 ng/mL — ABNORMAL HIGH (ref 11–307)

## 2017-07-16 NOTE — Progress Notes (Signed)
Hematology/Oncology Consult note [facility]  [phone] [phone]  Patient Care Team: [name], MD as PCP - General (Family Medicine)   Name of the patient: [name]  [id number]  1940/09/23   Date of visit: 07/16/17  Diagnosis- anemia likely due to iron deficiency and hypothyroidism as well as anemia of chronic disease   Chief complaint/ Reason for visit- routine f/u of anemia  Heme/Onc history: patient is a 76 year old female with a past medical history significant for hyperlipidemia, osteoporosis and arthritis. Recent blood work from 11/14/2016 showed B-12 level of 450. CBC showed white count of 8.3, H&H of 9.8/34 with an MCV of 77.4 and platelet count of 553. Her hemoglobin has been ranging around 9 05/26/2016 and a platelet count has been in the 500ssince then as well. BMP showed elevated blood sugar of 200 and mild hyponatremia. BUN and creatinine were within normal limits. Iron studies revealed decreased iron of less than 10, iron saturation of less than 4%, TIBC of 252. Urinalysis did not reveal any hematuria.  Patient lives alone and is independent of her ADLs but her mobility is limited because of her bilateral leg swelling. She takes Celebrex daily for arthritis. Denies any routine use of other NSAIDs. Denies any blood in her stno family history of colon cancerools.   Further blood work from 11/30/2016 was as follows: CBC showed hemoglobin of 10.4/32.2 with an MCV of 69.4 and a platelet count of 559. TSH was elevated at 11.7. Haptoglobin was elevated at 3 CN reticulocyte count was low at 0.9. H pylori stool antigen was negative. Celiac disease panel was within normal limits. Multiple myeloma panel revealed polyclonal gammopathy.  Patient was referred to GI but refused any endoscopy. She received 2 doses of feraheme     Interval history-she feels well today.  States that her energy levels are at baseline and she  continues to be independent of her ADLs.  She is on oral iron tablets and tolerating it well  ECOG PS- 1 Pain scale- 0 Opioid associated constipation- no  Review of systems- Review of Systems  Constitutional: Positive for malaise/fatigue. Negative for chills, fever and weight loss.  HENT: Negative for congestion, ear discharge and nosebleeds.   Eyes: Negative for blurred vision.  Respiratory: Negative for cough, hemoptysis, sputum production, shortness of breath and wheezing.   Cardiovascular: Negative for chest pain, palpitations, orthopnea and claudication.  Gastrointestinal: Negative for abdominal pain, blood in stool, constipation, diarrhea, heartburn, melena, nausea and vomiting.  Genitourinary: Negative for dysuria, flank pain, frequency, hematuria and urgency.  Musculoskeletal: Negative for back pain, joint pain and myalgias.  Skin: Negative for rash.  Neurological: Negative for dizziness, tingling, focal weakness, seizures, weakness and headaches.  Endo/Heme/Allergies: Does not bruise/bleed easily.  Psychiatric/Behavioral: Negative for depression and suicidal ideas. The patient does not have insomnia.       No Known Allergies   Past Medical History:  Diagnosis Date  . Arthritis      Past Surgical History:  Procedure Laterality Date  . APPENDECTOMY    . JOINT REPLACEMENT Right 2014   THR  . TONSILLECTOMY    . 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 Marital status: Widowed    Spouse name: Not on file  . Number of children: Not on file  .  Years of education: Not on file  . Highest education level: Not on file  Social Needs  . Financial resource strain: Not on file  . Food insecurity - worry: Not on file  . Food insecurity - inability: Not on file  . Transportation needs - medical: Not on  file  . Transportation needs - non-medical: Not on file  Occupational History  . Not on file  Tobacco Use  . Smoking status: Never Smoker  . Smokeless tobacco: Never Used  Substance and Sexual Activity  . Alcohol use: No  . Drug use: No  . Sexual activity: No  Other Topics Concern  . Not on file  Social History Narrative  . Not on file    Family History  Problem Relation Age of Onset  . Pancreatic cancer Mother   . Rheum arthritis Father   . Congestive Heart Failure Father   . Pancreatic cancer Maternal Aunt   . Heart attack Maternal Grandfather   . Arthritis Paternal Grandmother   . Arthritis Paternal Grandfather      Current Outpatient Medications:  .  acetaminophen (TYLENOL) 500 MG tablet, Take 500 mg by mouth every 6 (six) hours as needed., Disp: , Rfl:  .  aspirin EC 81 MG tablet, Take by mouth., Disp: , Rfl:  .  celecoxib (CELEBREX) 200 MG capsule, Take 200 mg by mouth 2 (two) times daily., Disp: , Rfl:  .  diclofenac sodium (VOLTAREN) 1 % GEL, , Disp: , Rfl:  .  Diclofenac Sodium 3 % GEL, Apply topically., Disp: , Rfl:  .  enoxaparin (LOVENOX) 30 MG/0.3ML injection, Inject 0.3 mLs (30 mg total) into the skin every 12 (twelve) hours. (Patient not taking: Reported on 05/06/2017), Disp: 28 Syringe, Rfl: 0 .  folic acid (FOLVITE) 1 MG tablet, Take by mouth., Disp: , Rfl:  .  furosemide (LASIX) 20 MG tablet, Take by mouth., Disp: , Rfl:  .  levothyroxine (SYNTHROID, LEVOTHROID) 75 MCG tablet, , Disp: , Rfl:  .  methotrexate (RHEUMATREX) 2.5 MG tablet, Take by mouth., Disp: , Rfl:  .  nitrofurantoin, macrocrystal-monohydrate, (MACROBID) 100 MG capsule, , Disp: , Rfl:  .  predniSONE (DELTASONE) 5 MG tablet, , Disp: , Rfl:  .  vitamin B-12 (CYANOCOBALAMIN) 1000 MCG tablet, Take 1,000 mcg by mouth daily., Disp: , Rfl:   Current Facility-Administered Medications:  .  betamethasone acetate-betamethasone sodium phosphate (CELESTONE) injection 3 mg, 3 mg, Intramuscular, Once,  Evans, Brent M, DPM .  betamethasone acetate-betamethasone sodium phosphate (CELESTONE) injection 3 mg, 3 mg, Intramuscular, Once, Edrick Kins, [place]  Physical exam:  Vitals:   07/16/17 1036 07/16/17 1038  BP:  130/71  Pulse:  94  Resp:  14  Temp:  98 F (36.7 C)  TempSrc:  Tympanic  Weight: 111 lb (50.3 kg)    Physical Exam  Constitutional: She is oriented to person, place, and time.  Thin elderly woman who appears in no acute distress  HENT:  Head: Normocephalic and atraumatic.  Eyes: EOM are normal. Pupils are equal, round, and reactive to light.  Neck: Normal range of motion.  Cardiovascular: Normal rate, regular rhythm and normal heart sounds.  Pulmonary/Chest: Effort normal and breath sounds normal.  Abdominal: Soft. Bowel sounds are normal.  Musculoskeletal: She exhibits edema (Bilateral ankle edema).  Neurological: She is alert and oriented to person, place, and time.  Skin: Skin is warm and dry.     CMP Latest Ref Rng & Units 09/02/2015  Glucose 65 - 99  mg/dL 94  BUN 6 - 20 mg/dL 7  Creatinine 0.44 - 1.00 mg/dL 0.62  Sodium 135 - 145 mmol/L 135  Potassium 3.5 - 5.1 mmol/L 3.7  Chloride 101 - 111 mmol/L 101  CO2 22 - 32 mmol/L 27  Calcium 8.9 - 10.3 mg/dL 8.2(L)   CBC Latest Ref Rng & Units 07/16/2017  WBC 3.6 - 11.0 K/uL 10.5  Hemoglobin 12.0 - 16.0 g/dL 12.1  Hematocrit 35.0 - 47.0 % 36.6  Platelets 150 - 440 K/uL 491(H)     Assessment and plan- Patient is a 76 y.o. female with multifactorial anemia secondary to iron deficiency as well as anemia of chronic disease  Iron studies from today are pending.  Iron studies from 3 months ago showed a combination of iron deficiency and anemia of chronic disease.  Her iron saturation was low at 10% and TIBC was low at 218.  Her hemoglobin has improved today to 12.1/36.6.  I do not anticipate that she needs IV iron at this time.  Patient has been seen by GI again and continues to refuse EGD and colonoscopy despite  ongoing weight loss.    I will repeat CBC ferritin and iron studies in 3 and 6 months see her back in 6 months   Thrombocytosis likely reactive and is trending down over the last couple of months.  Continue to monitor.  I will add Jak 2 mutation testing at 61-monthlabs   Visit Diagnosis 1. Iron deficiency anemia, unspecified iron deficiency anemia type   2. Anemia of chronic disease      Dr. ARanda Evens MD, MPH CSil[facility] Campusat AMohawk Valley Psychiatric CenterPager- [phone]/27/2018 10:52 AM

## 2017-07-16 NOTE — Telephone Encounter (Signed)
I will put the treatment plan in. Can you arrange the feraheme?

## 2017-07-16 NOTE — Telephone Encounter (Signed)
Patient called and stats she has changed her mind and wants to get IV iron. Asking that she be called with appointment date and time. [phone]

## 2017-07-16 NOTE — Progress Notes (Signed)
Patient here for follow up with labs today. She states that she is feeling well and denies having any pain. Her arthritis is well controlled with weekly methotrexate and daily prednisone.

## 2017-07-16 NOTE — Telephone Encounter (Signed)
Per Dr. Assunta Gambles request, called patient to let her know that her iron level is still low and to see if she would be agreeable to take two more doses of IV Feraheme. Patient stated that she does not want to take the extra IV iron; she feels well and would rather wait until February and re-check her iron levels at that time. She will continue to take her PO iron.       dhs

## 2017-07-16 NOTE — Telephone Encounter (Signed)
-----   Message from Creig Hines, MD sent at 07/16/2017 12:46 PM EST ----- Iron studies still show evidence of iron deficiency. I can put in for 2 more doses of feraheme if she is agreeable. Please ask her. Thanks, [name]

## 2017-07-22 ENCOUNTER — Inpatient Hospital Stay: Payer: PPO | Attending: Oncology

## 2017-07-22 VITALS — BP 137/66 | HR 86 | Temp 98.7°F | Resp 16

## 2017-07-22 DIAGNOSIS — D890 Polyclonal hypergammaglobulinemia: Secondary | ICD-10-CM | POA: Diagnosis not present

## 2017-07-22 DIAGNOSIS — M1990 Unspecified osteoarthritis, unspecified site: Secondary | ICD-10-CM | POA: Insufficient documentation

## 2017-07-22 DIAGNOSIS — E785 Hyperlipidemia, unspecified: Secondary | ICD-10-CM | POA: Diagnosis not present

## 2017-07-22 DIAGNOSIS — D638 Anemia in other chronic diseases classified elsewhere: Secondary | ICD-10-CM | POA: Diagnosis not present

## 2017-07-22 DIAGNOSIS — R634 Abnormal weight loss: Secondary | ICD-10-CM | POA: Insufficient documentation

## 2017-07-22 DIAGNOSIS — E871 Hypo-osmolality and hyponatremia: Secondary | ICD-10-CM | POA: Diagnosis not present

## 2017-07-22 DIAGNOSIS — Z96642 Presence of left artificial hip joint: Secondary | ICD-10-CM | POA: Insufficient documentation

## 2017-07-22 DIAGNOSIS — D509 Iron deficiency anemia, unspecified: Secondary | ICD-10-CM | POA: Diagnosis not present

## 2017-07-22 DIAGNOSIS — Z79899 Other long term (current) drug therapy: Secondary | ICD-10-CM | POA: Diagnosis not present

## 2017-07-22 MED ORDER — SODIUM CHLORIDE 0.9 % IV SOLN
Freq: Once | INTRAVENOUS | Status: AC
  Administered 2017-07-22: 14:00:00 via INTRAVENOUS
  Filled 2017-07-22: qty 1000

## 2017-07-22 MED ORDER — FERUMOXYTOL INJECTION 510 MG/17 ML
510.0000 mg | Freq: Once | INTRAVENOUS | Status: AC
  Administered 2017-07-22: 510 mg via INTRAVENOUS
  Filled 2017-07-22: qty 17

## 2017-07-29 ENCOUNTER — Ambulatory Visit: Payer: PPO

## 2017-08-01 ENCOUNTER — Ambulatory Visit: Payer: PPO

## 2017-08-05 ENCOUNTER — Inpatient Hospital Stay: Payer: PPO

## 2017-08-05 VITALS — BP 134/73 | HR 80 | Temp 99.3°F | Resp 18 | Wt 113.6 lb

## 2017-08-05 DIAGNOSIS — D509 Iron deficiency anemia, unspecified: Secondary | ICD-10-CM

## 2017-08-05 MED ORDER — SODIUM CHLORIDE 0.9 % IV SOLN
Freq: Once | INTRAVENOUS | Status: AC
  Administered 2017-08-05: 14:00:00 via INTRAVENOUS
  Filled 2017-08-05: qty 1000

## 2017-08-05 MED ORDER — FERUMOXYTOL INJECTION 510 MG/17 ML
510.0000 mg | Freq: Once | INTRAVENOUS | Status: AC
  Administered 2017-08-05: 510 mg via INTRAVENOUS
  Filled 2017-08-05: qty 17

## 2017-08-30 ENCOUNTER — Encounter: Payer: Self-pay | Admitting: Podiatry

## 2017-08-30 ENCOUNTER — Ambulatory Visit (INDEPENDENT_AMBULATORY_CARE_PROVIDER_SITE_OTHER): Payer: PPO | Admitting: Podiatry

## 2017-08-30 DIAGNOSIS — M79676 Pain in unspecified toe(s): Secondary | ICD-10-CM

## 2017-08-30 DIAGNOSIS — B351 Tinea unguium: Secondary | ICD-10-CM

## 2017-09-02 NOTE — Progress Notes (Signed)
   SUBJECTIVE Patient presents to office today complaining of elongated, thickened nails. Pain while ambulating in shoes. Patient is unable to trim their own nails.   Past Medical History:  Diagnosis Date  . Anemia   . Arthritis     OBJECTIVE General Patient is awake, alert, and oriented x 3 and in no acute distress. Derm Skin is dry and supple bilateral. Negative open lesions or macerations. Remaining integument unremarkable. Nails are tender, long, thickened and dystrophic with subungual debris, consistent with onychomycosis, 1-5 bilateral. No signs of infection noted. Vasc  DP and PT pedal pulses palpable bilaterally. Temperature gradient within normal limits.  Neuro Epicritic and protective threshold sensation diminished bilaterally.  Musculoskeletal Exam No symptomatic pedal deformities noted bilateral. Muscular strength within normal limits.  ASSESSMENT 1. Onychodystrophic nails 1-5 bilateral with hyperkeratosis of nails.  2. Onychomycosis of nail due to dermatophyte bilateral 3. Pain in foot bilateral  PLAN OF CARE 1. Patient evaluated today.  2. Instructed to maintain good pedal hygiene and foot care.  3. Mechanical debridement of nails 1-5 bilaterally performed using a nail nipper. Filed with dremel without incident.  4. Return to clinic in 3 mos.    Felecia Shelling, DPM [facility]  Dr. Felecia Shelling, DPM    [address]                                        [place], [place] [address]                Office [phone]  Fax [phone]

## 2017-09-06 DIAGNOSIS — M0579 Rheumatoid arthritis with rheumatoid factor of multiple sites without organ or systems involvement: Secondary | ICD-10-CM | POA: Diagnosis not present

## 2017-09-06 DIAGNOSIS — I10 Essential (primary) hypertension: Secondary | ICD-10-CM | POA: Diagnosis not present

## 2017-09-06 DIAGNOSIS — Z79899 Other long term (current) drug therapy: Secondary | ICD-10-CM | POA: Diagnosis not present

## 2017-09-18 ENCOUNTER — Telehealth: Payer: Self-pay | Admitting: Oncology

## 2017-09-18 NOTE — Telephone Encounter (Signed)
Rschd Lab/MD, per MD on PAL. L/M on V/M. Updated appt schd mailed. MF

## 2017-10-01 DIAGNOSIS — M0579 Rheumatoid arthritis with rheumatoid factor of multiple sites without organ or systems involvement: Secondary | ICD-10-CM | POA: Diagnosis not present

## 2017-10-01 DIAGNOSIS — M25461 Effusion, right knee: Secondary | ICD-10-CM | POA: Diagnosis not present

## 2017-10-01 DIAGNOSIS — M170 Bilateral primary osteoarthritis of knee: Secondary | ICD-10-CM | POA: Diagnosis not present

## 2017-10-01 DIAGNOSIS — M059 Rheumatoid arthritis with rheumatoid factor, unspecified: Secondary | ICD-10-CM | POA: Diagnosis not present

## 2017-10-01 DIAGNOSIS — Z79899 Other long term (current) drug therapy: Secondary | ICD-10-CM | POA: Diagnosis not present

## 2017-10-15 ENCOUNTER — Inpatient Hospital Stay: Payer: PPO | Attending: Oncology

## 2017-10-15 DIAGNOSIS — M818 Other osteoporosis without current pathological fracture: Secondary | ICD-10-CM | POA: Insufficient documentation

## 2017-10-15 DIAGNOSIS — E039 Hypothyroidism, unspecified: Secondary | ICD-10-CM | POA: Insufficient documentation

## 2017-10-15 DIAGNOSIS — E785 Hyperlipidemia, unspecified: Secondary | ICD-10-CM | POA: Diagnosis not present

## 2017-10-15 DIAGNOSIS — Z79899 Other long term (current) drug therapy: Secondary | ICD-10-CM | POA: Insufficient documentation

## 2017-10-15 DIAGNOSIS — M1990 Unspecified osteoarthritis, unspecified site: Secondary | ICD-10-CM | POA: Insufficient documentation

## 2017-10-15 DIAGNOSIS — D509 Iron deficiency anemia, unspecified: Secondary | ICD-10-CM | POA: Diagnosis not present

## 2017-10-15 DIAGNOSIS — D638 Anemia in other chronic diseases classified elsewhere: Secondary | ICD-10-CM

## 2017-10-15 LAB — CBC WITH DIFFERENTIAL/PLATELET
Basophils Absolute: 0.1 10*3/uL (ref 0–0.1)
Basophils Relative: 1 %
Eosinophils Absolute: 0 10*3/uL (ref 0–0.7)
Eosinophils Relative: 0 %
HEMATOCRIT: 40.4 % (ref 35.0–47.0)
HEMOGLOBIN: 13.5 g/dL (ref 12.0–16.0)
LYMPHS ABS: 0.5 10*3/uL — AB (ref 1.0–3.6)
LYMPHS PCT: 4 %
MCH: 31.3 pg (ref 26.0–34.0)
MCHC: 33.3 g/dL (ref 32.0–36.0)
MCV: 94.2 fL (ref 80.0–100.0)
MONO ABS: 0.8 10*3/uL (ref 0.2–0.9)
MONOS PCT: 6 %
Neutro Abs: 11.7 10*3/uL — ABNORMAL HIGH (ref 1.4–6.5)
Neutrophils Relative %: 89 %
Platelets: 391 10*3/uL (ref 150–440)
RBC: 4.29 MIL/uL (ref 3.80–5.20)
RDW: 16.2 % — AB (ref 11.5–14.5)
WBC: 13.1 10*3/uL — ABNORMAL HIGH (ref 3.6–11.0)

## 2017-10-15 LAB — IRON AND TIBC
IRON: 38 ug/dL (ref 28–170)
Saturation Ratios: 20 % (ref 10.4–31.8)
TIBC: 192 ug/dL — ABNORMAL LOW (ref 250–450)
UIBC: 154 ug/dL

## 2017-10-15 LAB — FERRITIN: Ferritin: 1125 ng/mL — ABNORMAL HIGH (ref 11–307)

## 2017-10-17 DIAGNOSIS — Z96643 Presence of artificial hip joint, bilateral: Secondary | ICD-10-CM | POA: Diagnosis not present

## 2017-10-22 LAB — CALR + JAK2 E12-15 + MPL (REFLEXED)

## 2017-10-22 LAB — JAK2 V617F, W REFLEX TO CALR/E12/MPL

## 2017-11-04 DIAGNOSIS — M0579 Rheumatoid arthritis with rheumatoid factor of multiple sites without organ or systems involvement: Secondary | ICD-10-CM | POA: Diagnosis not present

## 2017-11-04 DIAGNOSIS — E039 Hypothyroidism, unspecified: Secondary | ICD-10-CM | POA: Diagnosis not present

## 2017-11-07 DIAGNOSIS — H6062 Unspecified chronic otitis externa, left ear: Secondary | ICD-10-CM | POA: Diagnosis not present

## 2017-11-07 DIAGNOSIS — H6123 Impacted cerumen, bilateral: Secondary | ICD-10-CM | POA: Diagnosis not present

## 2017-11-29 ENCOUNTER — Encounter: Payer: Self-pay | Admitting: Podiatry

## 2017-11-29 ENCOUNTER — Ambulatory Visit (INDEPENDENT_AMBULATORY_CARE_PROVIDER_SITE_OTHER): Payer: PPO | Admitting: Podiatry

## 2017-11-29 DIAGNOSIS — M79676 Pain in unspecified toe(s): Secondary | ICD-10-CM | POA: Diagnosis not present

## 2017-11-29 DIAGNOSIS — B351 Tinea unguium: Secondary | ICD-10-CM | POA: Diagnosis not present

## 2017-12-02 DIAGNOSIS — M0579 Rheumatoid arthritis with rheumatoid factor of multiple sites without organ or systems involvement: Secondary | ICD-10-CM | POA: Diagnosis not present

## 2017-12-02 DIAGNOSIS — Z79899 Other long term (current) drug therapy: Secondary | ICD-10-CM | POA: Diagnosis not present

## 2017-12-02 DIAGNOSIS — E039 Hypothyroidism, unspecified: Secondary | ICD-10-CM | POA: Diagnosis not present

## 2017-12-02 NOTE — Progress Notes (Signed)
   SUBJECTIVE Patient presents to office today complaining of elongated, thickened nails that cause pain while ambulating in shoes. She is unable to trim her own nails. Patient is here for further evaluation and treatment.  Past Medical History:  Diagnosis Date  . Anemia   . Arthritis     OBJECTIVE General Patient is awake, alert, and oriented x 3 and in no acute distress. Derm Skin is dry and supple bilateral. Negative open lesions or macerations. Remaining integument unremarkable. Nails are tender, long, thickened and dystrophic with subungual debris, consistent with onychomycosis, 1-5 bilateral. No signs of infection noted. Vasc  DP and PT pedal pulses palpable bilaterally. Temperature gradient within normal limits.  Neuro Epicritic and protective threshold sensation grossly intact bilaterally.  Musculoskeletal Exam No symptomatic pedal deformities noted bilateral. Muscular strength within normal limits.  ASSESSMENT 1. Onychodystrophic nails 1-5 bilateral with hyperkeratosis of nails.  2. Onychomycosis of nail due to dermatophyte bilateral 3. Pain in foot bilateral  PLAN OF CARE 1. Patient evaluated today.  2. Instructed to maintain good pedal hygiene and foot care.  3. Mechanical debridement of nails 1-5 bilaterally performed using a nail nipper. Filed with dremel without incident.  4. Return to clinic in 3 mos.    Felecia Shelling, DPM [facility]  Dr. Felecia Shelling, DPM    [address]                                        [place], [place] [address]                Office [phone]  Fax [phone]

## 2017-12-30 DIAGNOSIS — M0579 Rheumatoid arthritis with rheumatoid factor of multiple sites without organ or systems involvement: Secondary | ICD-10-CM | POA: Diagnosis not present

## 2017-12-30 DIAGNOSIS — M170 Bilateral primary osteoarthritis of knee: Secondary | ICD-10-CM | POA: Diagnosis not present

## 2017-12-30 DIAGNOSIS — Z79899 Other long term (current) drug therapy: Secondary | ICD-10-CM | POA: Diagnosis not present

## 2018-01-14 ENCOUNTER — Other Ambulatory Visit: Payer: PPO

## 2018-01-14 ENCOUNTER — Ambulatory Visit: Payer: PPO | Admitting: Oncology

## 2018-01-23 ENCOUNTER — Encounter: Payer: Self-pay | Admitting: Oncology

## 2018-01-23 ENCOUNTER — Inpatient Hospital Stay (HOSPITAL_BASED_OUTPATIENT_CLINIC_OR_DEPARTMENT_OTHER): Payer: PPO | Admitting: Oncology

## 2018-01-23 ENCOUNTER — Inpatient Hospital Stay: Payer: PPO | Attending: Oncology

## 2018-01-23 ENCOUNTER — Other Ambulatory Visit: Payer: Self-pay

## 2018-01-23 VITALS — BP 105/59 | HR 93 | Temp 98.4°F | Resp 18 | Ht 65.0 in | Wt 108.3 lb

## 2018-01-23 DIAGNOSIS — E039 Hypothyroidism, unspecified: Secondary | ICD-10-CM | POA: Diagnosis not present

## 2018-01-23 DIAGNOSIS — D509 Iron deficiency anemia, unspecified: Secondary | ICD-10-CM

## 2018-01-23 DIAGNOSIS — D638 Anemia in other chronic diseases classified elsewhere: Secondary | ICD-10-CM

## 2018-01-23 LAB — CBC WITH DIFFERENTIAL/PLATELET
BASOS PCT: 1 %
Basophils Absolute: 0 10*3/uL (ref 0–0.1)
Eosinophils Absolute: 0.1 10*3/uL (ref 0–0.7)
Eosinophils Relative: 2 %
HEMATOCRIT: 32.4 % — AB (ref 35.0–47.0)
HEMOGLOBIN: 10.8 g/dL — AB (ref 12.0–16.0)
Lymphocytes Relative: 8 %
Lymphs Abs: 0.7 10*3/uL — ABNORMAL LOW (ref 1.0–3.6)
MCH: 29.5 pg (ref 26.0–34.0)
MCHC: 33.2 g/dL (ref 32.0–36.0)
MCV: 88.7 fL (ref 80.0–100.0)
MONO ABS: 0.4 10*3/uL (ref 0.2–0.9)
MONOS PCT: 5 %
NEUTROS ABS: 6.5 10*3/uL (ref 1.4–6.5)
NEUTROS PCT: 84 %
Platelets: 485 10*3/uL — ABNORMAL HIGH (ref 150–440)
RBC: 3.65 MIL/uL — ABNORMAL LOW (ref 3.80–5.20)
RDW: 16.5 % — AB (ref 11.5–14.5)
WBC: 7.7 10*3/uL (ref 3.6–11.0)

## 2018-01-23 LAB — IRON AND TIBC
Iron: 12 ug/dL — ABNORMAL LOW (ref 28–170)
Saturation Ratios: 8 % — ABNORMAL LOW (ref 10.4–31.8)
TIBC: 147 ug/dL — AB (ref 250–450)
UIBC: 135 ug/dL

## 2018-01-23 LAB — FERRITIN: Ferritin: 1106 ng/mL — ABNORMAL HIGH (ref 11–307)

## 2018-01-24 ENCOUNTER — Telehealth: Payer: Self-pay

## 2018-01-24 NOTE — Telephone Encounter (Signed)
Left message on [name] v/m to inform her that she chronic iron def. and Dr, Smith Robert would like to startIV Feraheme x 2     By Bronwen Betters, CMA

## 2018-01-24 NOTE — Telephone Encounter (Signed)
-----   Message from Corene Cornea, RN sent at 01/24/2018  4:23 PM EDT -----   ----- Message ----- From: Creig Hines, MD Sent: 01/24/2018   8:20 AM To: Corene Cornea, RN  Iron studies indicative of both chronic disease and iron deficiency. She has responded to IV iron in the past. Please ask her if she can come for feraheme. Thanks, Ovidio Kin

## 2018-01-24 NOTE — Progress Notes (Signed)
Hematology/Oncology Consult note [facility]  Telephone:([phone] [phone]  Patient Care Team: [name], MD as PCP - General (Family Medicine)   Name of the patient: [name]  [id number]  09/06/1940   Date of visit: 01/24/18  Diagnosis- anemia likely due to iron deficiency and hypothyroidism as well as anemia of chronic disease   Chief complaint/ Reason for visit- routine f/u of anemia  Heme/Onc history:  patient is a 77 year old female with a past medical history significant for hyperlipidemia, osteoporosis and arthritis. Recent blood work from 11/14/2016 showed B-12 level of 450. CBC showed white count of 8.3, H&H of 9.8/34 with an MCV of 77.4 and platelet count of 553. Her hemoglobin has been ranging around 9 05/26/2016 and a platelet count has been in the 500ssince then as well. BMP showed elevated blood sugar of 200 and mild hyponatremia. BUN and creatinine were within normal limits. Iron studies revealed decreased iron of less than 10, iron saturation of less than 4%, TIBC of 252. Urinalysis did not reveal any hematuria.  Patient lives alone and is independent of her ADLs but her mobility is limited because of her bilateral leg swelling. She takes Celebrex daily for arthritis. Denies any routine use of other NSAIDs. Denies any blood in her stno family history of colon cancerools.  Further blood work from 11/30/2016 was as follows: CBC showed hemoglobin of 10.4/32.2 with an MCV of 69.4 and a platelet count of 559. TSH was elevated at 11.7. Haptoglobin was elevated at 3 CN reticulocyte count was low at 0.9. H pylori stool antigen was negative. Celiac disease panel was within normal limits. Multiple myeloma panel revealed polyclonal gammopathy.  Patient was referred to GI but refused any endoscopy. She received 2 doses of ferahemewith improvement of her anemia in the past   Interval history- has chronic pain in her joints due  to arthritis. She feels better when she is on steroids and when she comes off it they start hurting again  ECOG PS- 2 Pain scale- 6   Review of systems- Review of Systems  Constitutional: Negative for chills, fever, malaise/fatigue and weight loss.  HENT: Negative for congestion, ear discharge and nosebleeds.   Eyes: Negative for blurred vision.  Respiratory: Negative for cough, hemoptysis, sputum production, shortness of breath and wheezing.   Cardiovascular: Negative for chest pain, palpitations, orthopnea and claudication.  Gastrointestinal: Negative for abdominal pain, blood in stool, constipation, diarrhea, heartburn, melena, nausea and vomiting.  Genitourinary: Negative for dysuria, flank pain, frequency, hematuria and urgency.  Musculoskeletal: Positive for joint pain. Negative for back pain and myalgias.  Skin: Negative for rash.  Neurological: Negative for dizziness, tingling, focal weakness, seizures, weakness and headaches.  Endo/Heme/Allergies: Does not bruise/bleed easily.  Psychiatric/Behavioral: Negative for depression and suicidal ideas. The patient does not have insomnia.      No Known Allergies   Past Medical History:  Diagnosis Date  . Anemia   . Arthritis      Past Surgical History:  Procedure Laterality Date  . APPENDECTOMY    . JOINT REPLACEMENT Right 2014   THR  . TONSILLECTOMY    . 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 Marital status: Widowed    Spouse name: Not on file  . Number of children: Not on file  .  Years of education: Not on file  . Highest education level: Not on file  Occupational History  . Not on file  Social Needs  . Financial resource strain: Not on file  . Food insecurity:    Worry: Not on file    Inability: Not on file  . Transportation needs:     Medical: Not on file    Non-medical: Not on file  Tobacco Use  . Smoking status: Never Smoker  . Smokeless tobacco: Never Used  Substance and Sexual Activity  . Alcohol use: No  . Drug use: No  . Sexual activity: Never  Lifestyle  . Physical activity:    Days per week: Not on file    Minutes per session: Not on file  . Stress: Not on file  Relationships  . Social connections:    Talks on phone: Not on file    Gets together: Not on file    Attends religious service: Not on file    Active member of club or organization: Not on file    Attends meetings of clubs or organizations: Not on file    Relationship status: Not on file  . Intimate partner violence:    Fear of current or ex partner: Not on file    Emotionally abused: Not on file    Physically abused: Not on file    Forced sexual activity: Not on file  Other Topics Concern  . Not on file  Social History Narrative  . Not on file    Family History  Problem Relation Age of Onset  . Pancreatic cancer Mother   . Rheum arthritis Father   . Congestive Heart Failure Father   . Pancreatic cancer Maternal Aunt   . Heart attack Maternal Grandfather   . Arthritis Paternal Grandmother   . Arthritis Paternal Grandfather      Current Outpatient Medications:  .  aspirin EC 81 MG tablet, Take by mouth., Disp: , Rfl:  .  cholecalciferol (VITAMIN D) 1000 units tablet, Take 1,000 Units by mouth daily., Disp: , Rfl:  .  ferrous sulfate 324 (65 Fe) MG TBEC, Take by mouth 2 (two) times daily., Disp: , Rfl:  .  folic acid (FOLVITE) 1 MG tablet, Take by mouth., Disp: , Rfl:  .  levothyroxine (SYNTHROID, LEVOTHROID) 75 MCG tablet, , Disp: , Rfl:  .  methotrexate (RHEUMATREX) 2.5 MG tablet, Take 15 mg by mouth once a week. , Disp: , Rfl:  .  vitamin B-12 (CYANOCOBALAMIN) 1000 MCG tablet, Take 1,000 mcg by mouth daily., Disp: , Rfl:  .  acetaminophen (TYLENOL) 500 MG tablet, Take 500 mg by mouth every 6 (six) hours as needed., Disp: , Rfl:   .  diclofenac sodium (VOLTAREN) 1 % GEL, , Disp: , Rfl:  .  furosemide (LASIX) 20 MG tablet, Take by mouth., Disp: , Rfl:  .  predniSONE (DELTASONE) 5 MG tablet, , Disp: , Rfl:   Current Facility-Administered Medications:  .  betamethasone acetate-betamethasone sodium phosphate (CELESTONE) injection 3 mg, 3 mg, Intramuscular, Once, Evans, Brent M, DPM .  betamethasone acetate-betamethasone sodium phosphate (CELESTONE) injection 3 mg, 3 mg, Intramuscular, Once, Edrick Kins, DPM  Physical exam:  Vitals:   01/23/18 1424  BP: (!) 105/59  Pulse: 93  Resp: 18  Temp: 98.4 F (36.9 C)  TempSrc: Tympanic  SpO2: 98%  Weight: 108 lb 4.8 oz (49.1 kg)  Height: '5\' 5"'  (1.651 m)   Physical Exam  Constitutional: She is oriented to person,  place, and time.  Thin cachectic female in no acute distress  HENT:  Head: Normocephalic and atraumatic.  Eyes: Pupils are equal, round, and reactive to light. EOM are normal.  Neck: Normal range of motion.  Cardiovascular: Normal rate, regular rhythm and normal heart sounds.  Pulmonary/Chest: Effort normal and breath sounds normal.  Abdominal: Soft. Bowel sounds are normal.  Musculoskeletal: She exhibits edema (trace b/l).  Neurological: She is alert and oriented to person, place, and time.  Skin: Skin is warm and dry.     CMP Latest Ref Rng & Units 09/02/2015  Glucose 65 - 99 mg/dL 94  BUN 6 - 20 mg/dL 7  Creatinine 0.44 - 1.00 mg/dL 0.62  Sodium 135 - 145 mmol/L 135  Potassium 3.5 - 5.1 mmol/L 3.7  Chloride 101 - 111 mmol/L 101  CO2 22 - 32 mmol/L 27  Calcium 8.9 - 10.3 mg/dL 8.2(L)   CBC Latest Ref Rng & Units 01/23/2018  WBC 3.6 - 11.0 K/uL 7.7  Hemoglobin 12.0 - 16.0 g/dL 10.8(L)  Hematocrit 35.0 - 47.0 % 32.4(L)  Platelets 150 - 440 K/uL 485(H)     Assessment and plan- Patient is a 77 y.o. female with multifactorial anemia secondary to iron deficiency as well as anemia of chronic disease here for routine f/u of anemia  She is anemic  today at 10.8 as compared to before when her hb was 12. Iron studies show combination of both anemia of chronic disease given the high ferritin and iron deficiency- low serum iron and iron saturation. Will arrange for 2 more doses of feraheme at this time  Repeat cbc ferritin and iron studies in 3 and 6 months and I will see her back in 6 months    Visit Diagnosis 1. Iron deficiency anemia, unspecified iron deficiency anemia type   2. Anemia of chronic disease      Dr. Randa Evens, MD, MPH [facility] [phone] 01/24/2018 12:32 PM

## 2018-01-27 ENCOUNTER — Telehealth: Payer: Self-pay

## 2018-01-27 NOTE — Telephone Encounter (Signed)
-----   Message from Sharon Y Venable, RN sent at 01/24/2018  4:23 PM EDT -----   ----- Message ----- From: Rao, Archana C, MD Sent: 01/24/2018   8:20 AM To: Sharon Y Venable, RN  Iron studies indicative of both chronic disease and iron deficiency. She has responded to IV iron in the past. Please ask her if she can come for feraheme. Thanks, Archana 

## 2018-01-27 NOTE — Telephone Encounter (Signed)
Ms. Bracewell called to inform Dr. Smith [name] office that she did receive the message that was left on her v/m and was agreeable to come in tomorrow 6/11) and Monday or Tuesday of next week to get [facility] treatment. The patient was understanding for treatment

## 2018-01-27 NOTE — Telephone Encounter (Signed)
Called to inform the patient of her labs ( no answer) Left message on [name] v/m to inform her that she is chronic iron def. and Dr, Smith Robert would like to startIV Feraheme x 2. I / Toni Amend will try to contact her on 01/28/18.

## 2018-01-31 DIAGNOSIS — M170 Bilateral primary osteoarthritis of knee: Secondary | ICD-10-CM | POA: Diagnosis not present

## 2018-01-31 DIAGNOSIS — M160 Bilateral primary osteoarthritis of hip: Secondary | ICD-10-CM | POA: Diagnosis not present

## 2018-01-31 DIAGNOSIS — M0579 Rheumatoid arthritis with rheumatoid factor of multiple sites without organ or systems involvement: Secondary | ICD-10-CM | POA: Diagnosis not present

## 2018-01-31 DIAGNOSIS — Z79899 Other long term (current) drug therapy: Secondary | ICD-10-CM | POA: Diagnosis not present

## 2018-02-03 ENCOUNTER — Inpatient Hospital Stay: Payer: PPO

## 2018-02-06 ENCOUNTER — Inpatient Hospital Stay: Payer: PPO

## 2018-02-06 VITALS — BP 104/53 | HR 78 | Temp 97.7°F | Resp 18

## 2018-02-06 DIAGNOSIS — D509 Iron deficiency anemia, unspecified: Secondary | ICD-10-CM | POA: Diagnosis not present

## 2018-02-06 DIAGNOSIS — D638 Anemia in other chronic diseases classified elsewhere: Secondary | ICD-10-CM

## 2018-02-06 MED ORDER — SODIUM CHLORIDE 0.9 % IV SOLN
Freq: Once | INTRAVENOUS | Status: AC
  Administered 2018-02-06: 13:00:00 via INTRAVENOUS
  Filled 2018-02-06: qty 1000

## 2018-02-06 MED ORDER — SODIUM CHLORIDE 0.9 % IV SOLN
510.0000 mg | Freq: Once | INTRAVENOUS | Status: AC
  Administered 2018-02-06: 510 mg via INTRAVENOUS
  Filled 2018-02-06: qty 17

## 2018-02-11 ENCOUNTER — Inpatient Hospital Stay: Payer: PPO | Attending: Oncology

## 2018-02-11 VITALS — BP 109/56 | HR 66 | Temp 98.1°F | Resp 18

## 2018-02-11 DIAGNOSIS — D509 Iron deficiency anemia, unspecified: Secondary | ICD-10-CM

## 2018-02-11 MED ORDER — SODIUM CHLORIDE 0.9 % IV SOLN
510.0000 mg | Freq: Once | INTRAVENOUS | Status: AC
  Administered 2018-02-11: 510 mg via INTRAVENOUS
  Filled 2018-02-11: qty 17

## 2018-02-11 MED ORDER — SODIUM CHLORIDE 0.9 % IV SOLN
Freq: Once | INTRAVENOUS | Status: AC
  Administered 2018-02-11: 13:00:00 via INTRAVENOUS
  Filled 2018-02-11: qty 1000

## 2018-03-11 ENCOUNTER — Ambulatory Visit: Payer: PPO | Admitting: Podiatry

## 2018-03-14 ENCOUNTER — Ambulatory Visit: Payer: PPO | Admitting: Podiatry

## 2018-03-17 ENCOUNTER — Ambulatory Visit (INDEPENDENT_AMBULATORY_CARE_PROVIDER_SITE_OTHER): Payer: PPO | Admitting: Podiatry

## 2018-03-17 ENCOUNTER — Encounter: Payer: Self-pay | Admitting: Podiatry

## 2018-03-17 DIAGNOSIS — M79676 Pain in unspecified toe(s): Secondary | ICD-10-CM

## 2018-03-17 DIAGNOSIS — B351 Tinea unguium: Secondary | ICD-10-CM | POA: Diagnosis not present

## 2018-03-17 NOTE — Progress Notes (Signed)
Complaint:  Visit Type: Patient returns to my office for continued preventative foot care services. Complaint: Patient states" my nails have grown long and thick and become painful to walk and wear shoes"  The patient presents for preventative foot care services. No changes to ROS  Podiatric Exam: Vascular: dorsalis pedis and posterior tibial pulses are palpable bilateral. Capillary return is immediate. Temperature gradient is WNL. Skin turgor WNL  Sensorium: Normal Semmes Weinstein monofilament test. Normal tactile sensation bilaterally. Nail Exam: Pt has thick disfigured discolored nails with subungual debris noted bilateral entire nail hallux through fifth toenails Ulcer Exam: There is no evidence of ulcer or pre-ulcerative changes or infection. Orthopedic Exam: Muscle tone and strength are WNL. No limitations in general ROM. No crepitus or effusions noted. Foot type and digits show no abnormalities. Severe HAV  B/L with overlapping second. Skin: No Porokeratosis. No infection or ulcers  Diagnosis:  Onychomycosis, , Pain in right toe, pain in left toes  Treatment & Plan Procedures and Treatment: Consent by patient was obtained for treatment procedures.   Debridement of mycotic and hypertrophic toenails, 1 through 5 bilateral and clearing of subungual debris. No ulceration, no infection noted.  Return Visit-Office Procedure: Patient instructed to return to the office for a follow up visit 3 months for continued evaluation and treatment.    Helane Gunther DPM

## 2018-04-01 DIAGNOSIS — M0579 Rheumatoid arthritis with rheumatoid factor of multiple sites without organ or systems involvement: Secondary | ICD-10-CM | POA: Diagnosis not present

## 2018-04-24 ENCOUNTER — Other Ambulatory Visit: Payer: Self-pay | Admitting: *Deleted

## 2018-04-24 DIAGNOSIS — D509 Iron deficiency anemia, unspecified: Secondary | ICD-10-CM

## 2018-04-25 ENCOUNTER — Inpatient Hospital Stay: Payer: PPO | Attending: Oncology | Admitting: Oncology

## 2018-04-25 ENCOUNTER — Encounter: Payer: Self-pay | Admitting: Oncology

## 2018-04-25 ENCOUNTER — Inpatient Hospital Stay: Payer: PPO

## 2018-04-25 VITALS — BP 105/58 | HR 76 | Temp 99.1°F | Resp 18 | Ht 65.0 in | Wt 106.8 lb

## 2018-04-25 DIAGNOSIS — I10 Essential (primary) hypertension: Secondary | ICD-10-CM | POA: Diagnosis not present

## 2018-04-25 DIAGNOSIS — D509 Iron deficiency anemia, unspecified: Secondary | ICD-10-CM | POA: Insufficient documentation

## 2018-04-25 DIAGNOSIS — D638 Anemia in other chronic diseases classified elsewhere: Secondary | ICD-10-CM | POA: Diagnosis not present

## 2018-04-25 LAB — CBC
HCT: 32.5 % — ABNORMAL LOW (ref 35.0–47.0)
Hemoglobin: 10.7 g/dL — ABNORMAL LOW (ref 12.0–16.0)
MCH: 30.3 pg (ref 26.0–34.0)
MCHC: 33 g/dL (ref 32.0–36.0)
MCV: 91.9 fL (ref 80.0–100.0)
PLATELETS: 400 10*3/uL (ref 150–440)
RBC: 3.54 MIL/uL — AB (ref 3.80–5.20)
RDW: 18.3 % — AB (ref 11.5–14.5)
WBC: 5.8 10*3/uL (ref 3.6–11.0)

## 2018-04-25 LAB — FERRITIN: Ferritin: 843 ng/mL — ABNORMAL HIGH (ref 11–307)

## 2018-04-25 LAB — IRON AND TIBC
Iron: 16 ug/dL — ABNORMAL LOW (ref 28–170)
SATURATION RATIOS: 11 % (ref 10.4–31.8)
TIBC: 151 ug/dL — AB (ref 250–450)
UIBC: 135 ug/dL

## 2018-04-25 NOTE — Progress Notes (Signed)
PT TIREDB UT ABOUT THE SAME

## 2018-04-29 NOTE — Progress Notes (Signed)
Hematology/Oncology Consult note [facility]  Telephone:([phone] Fax:([phone]  Patient Care Team: [name], MD as PCP - General (Family Medicine)   Name of the patient: [name]  [id number]  1941/01/09   Date of visit: 04/29/18  Diagnosis- anemia likely due to iron deficiency and hypothyroidismas well as anemia of chronic disease   Chief complaint/ Reason for visit- routine f/u of anemia  Heme/Onc history: patient is a 77 year old female with a past medical history significant for hyperlipidemia, osteoporosis and arthritis. Recent blood work from 11/14/2016 showed B-12 level of 450. CBC showed white count of 8.3, H&H of 9.8/34 with an MCV of 77.4 and platelet count of 553. Her hemoglobin has been ranging around 9 05/26/2016 and a platelet count has been in the 500ssince then as well. BMP showed elevated blood sugar of 200 and mild hyponatremia. BUN and creatinine were within normal limits. Iron studies revealed decreased iron of less than 10, iron saturation of less than 4%, TIBC of 252. Urinalysis did not reveal any hematuria.  Patient lives alone and is independent of her ADLs but her mobility is limited because of her bilateral leg swelling. She takes Celebrex daily for arthritis. Denies any routine use of other NSAIDs. Denies any blood in her stno family history of colon cancerools.  Further blood work from 11/30/2016 was as follows: CBC showed hemoglobin of 10.4/32.2 with an MCV of 69.4 and a platelet count of 559. TSH was elevated at 11.7. Haptoglobin was elevated at 3 CN reticulocyte count was low at 0.9. H pylori stool antigen was negative. Celiac disease panel was within normal limits. Multiple myeloma panel revealed polyclonal gammopathy.  Patient was referred to GI but refused any endoscopy. She received 2 doses of ferahemewith improvement of her anemia in the past   Interval history- she has chronic joint pain which is  stable. Has mild fatigue. Denies other complaints  ECOG PS- 2 Pain scale- 0 Opioid associated constipation- no  Review of systems- Review of Systems  Constitutional: Positive for malaise/fatigue. Negative for chills, fever and weight loss.  HENT: Negative for congestion, ear discharge and nosebleeds.   Eyes: Negative for blurred vision.  Respiratory: Negative for cough, hemoptysis, sputum production, shortness of breath and wheezing.   Cardiovascular: Positive for leg swelling. Negative for chest pain, palpitations, orthopnea and claudication.  Gastrointestinal: Negative for abdominal pain, blood in stool, constipation, diarrhea, heartburn, melena, nausea and vomiting.  Genitourinary: Negative for dysuria, flank pain, frequency, hematuria and urgency.  Musculoskeletal: Negative for back pain, joint pain and myalgias.  Skin: Negative for rash.  Neurological: Negative for dizziness, tingling, focal weakness, seizures, weakness and headaches.  Endo/Heme/Allergies: Does not bruise/bleed easily.  Psychiatric/Behavioral: Negative for depression and suicidal ideas. The patient does not have insomnia.        No Known Allergies   Past Medical History:  Diagnosis Date  . Anemia   . Arthritis   . Hypertension      Past Surgical History:  Procedure Laterality Date  . APPENDECTOMY    . JOINT REPLACEMENT Right 2014   THR  . TONSILLECTOMY    . 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 Marital status: Widowed    Spouse name: Not on file  . Number of children: Not on file  . Years  of education: Not on file  . Highest education level: Not on file  Occupational History  . Not on file  Social Needs  . Financial resource strain: Not on file  . Food insecurity:    Worry: Not on file    Inability: Not on file  .  Transportation needs:    Medical: Not on file    Non-medical: Not on file  Tobacco Use  . Smoking status: Never Smoker  . Smokeless tobacco: Never Used  Substance and Sexual Activity  . Alcohol use: No  . Drug use: No  . Sexual activity: Never  Lifestyle  . Physical activity:    Days per week: Not on file    Minutes per session: Not on file  . Stress: Not on file  Relationships  . Social connections:    Talks on phone: Not on file    Gets together: Not on file    Attends religious service: Not on file    Active member of club or organization: Not on file    Attends meetings of clubs or organizations: Not on file    Relationship status: Not on file  . Intimate partner violence:    Fear of current or ex partner: Not on file    Emotionally abused: Not on file    Physically abused: Not on file    Forced sexual activity: Not on file  Other Topics Concern  . Not on file  Social History Narrative  . Not on file    Family History  Problem Relation Age of Onset  . Pancreatic cancer Mother   . Rheum arthritis Father   . Congestive Heart Failure Father   . Pancreatic cancer Maternal Aunt   . Heart attack Maternal Grandfather   . Arthritis Paternal Grandmother   . Arthritis Paternal Grandfather      Current Outpatient Medications:  .  acetaminophen (TYLENOL) 500 MG tablet, Take 500 mg by mouth 3 (three) times daily. , Disp: , Rfl:  .  aspirin EC 81 MG tablet, Take 81 mg by mouth daily. , Disp: , Rfl:  .  cholecalciferol (VITAMIN D) 1000 units tablet, Take 1,000 Units by mouth daily., Disp: , Rfl:  .  diclofenac sodium (VOLTAREN) 1 % GEL, Apply 2 g topically 3 (three) times daily as needed. , Disp: , Rfl:  .  ferrous sulfate 324 (65 Fe) MG TBEC, Take 1 tablet by mouth 2 (two) times daily. , Disp: , Rfl:  .  folic acid (FOLVITE) 1 MG tablet, Take 1 mg by mouth daily., Disp: , Rfl:  .  furosemide (LASIX) 20 MG tablet, Take 20 mg by mouth daily. , Disp: , Rfl:  .  levothyroxine  (SYNTHROID, LEVOTHROID) 75 MCG tablet, , Disp: , Rfl:  .  methotrexate (RHEUMATREX) 2.5 MG tablet, Take 15 mg by mouth once a week. , Disp: , Rfl:  .  predniSONE (DELTASONE) 5 MG tablet, Take 7.5 mg by mouth daily. , Disp: , Rfl:  .  vitamin B-12 (CYANOCOBALAMIN) 1000 MCG tablet, Take 1,000 mcg by mouth daily., Disp: , Rfl:   Current Facility-Administered Medications:  .  betamethasone acetate-betamethasone sodium phosphate (CELESTONE) injection 3 mg, 3 mg, Intramuscular, Once, Evans, Brent M, DPM .  betamethasone acetate-betamethasone sodium phosphate (CELESTONE) injection 3 mg, 3 mg, Intramuscular, Once, Edrick Kins, DPM  Physical exam:  Vitals:   04/25/18 1455  BP: (!) 105/58  Pulse: 76  Resp: 18  Temp: 99.1 F (37.3 C)  TempSrc: Tympanic  Weight: 106 lb 12.8 oz (48.4 kg)  Height: _0  (1.651 m)   Physical Exam  Constitutional: She is oriented to person, place, and time.  Thin elderly female in no acute distress  HENT:  Head: Normocephalic and atraumatic.  Eyes: Pupils are equal, round, and reactive to light. EOM are normal.  Neck: Normal range of motion.  Cardiovascular: Normal rate, regular rhythm and normal heart sounds.  Pulmonary/Chest: Effort normal and breath sounds normal.  Abdominal: Soft. Bowel sounds are normal.  Musculoskeletal: She exhibits edema (b/l +1).  Neurological: She is alert and oriented to person, place, and time.  Skin: Skin is warm and dry.     CMP Latest Ref Rng & Units 09/02/2015  Glucose 65 - 99 mg/dL 94  BUN 6 - 20 mg/dL 7  Creatinine 0.44 - 1.00 mg/dL 0.62  Sodium 135 - 145 mmol/L 135  Potassium 3.5 - 5.1 mmol/L 3.7  Chloride 101 - 111 mmol/L 101  CO2 22 - 32 mmol/L 27  Calcium 8.9 - 10.3 mg/dL 8.2(L)   CBC Latest Ref Rng & Units 04/25/2018  WBC 3.6 - 11.0 K/uL 5.8  Hemoglobin 12.0 - 16.0 g/dL 10.7(L)  Hematocrit 35.0 - 47.0 % 32.5(L)  Platelets 150 - 440 K/uL 400      Assessment and plan- Patient is a 77 y.o. female with  normocytic anemia likely due to chronic disease  Patient received 2 doses of Feraheme but her hemoglobin has not improved significantly.  Over the last 1 year her hemoglobin has gone up to 12 only on 2 occasions but outside of that her hemoglobin has always been between 10-11.  Her present hemoglobin is at that level and I will continue to monitor that.  She does not require any IV iron at this time.  Repeat CBC in 3 in 6 months and I will see her back in 6 months.  We will repeat iron studies and B12 in 6 months.  Her anemia is likely due to her arthritis as well as some component of iron deficiency   Visit Diagnosis 1. Iron deficiency anemia, unspecified iron deficiency anemia type   2. Anemia of chronic disease      Dr. Randa Evens, MD, MPH [facility] [phone] 04/29/2018 12:32 PM

## 2018-05-01 DIAGNOSIS — Z79899 Other long term (current) drug therapy: Secondary | ICD-10-CM | POA: Diagnosis not present

## 2018-05-01 DIAGNOSIS — M25561 Pain in right knee: Secondary | ICD-10-CM | POA: Insufficient documentation

## 2018-05-01 DIAGNOSIS — M160 Bilateral primary osteoarthritis of hip: Secondary | ICD-10-CM | POA: Diagnosis not present

## 2018-05-01 DIAGNOSIS — M170 Bilateral primary osteoarthritis of knee: Secondary | ICD-10-CM | POA: Diagnosis not present

## 2018-05-01 DIAGNOSIS — M0579 Rheumatoid arthritis with rheumatoid factor of multiple sites without organ or systems involvement: Secondary | ICD-10-CM | POA: Diagnosis not present

## 2018-05-21 DIAGNOSIS — M0579 Rheumatoid arthritis with rheumatoid factor of multiple sites without organ or systems involvement: Secondary | ICD-10-CM | POA: Diagnosis not present

## 2018-05-21 DIAGNOSIS — R634 Abnormal weight loss: Secondary | ICD-10-CM | POA: Diagnosis not present

## 2018-05-21 DIAGNOSIS — Z0000 Encounter for general adult medical examination without abnormal findings: Secondary | ICD-10-CM | POA: Diagnosis not present

## 2018-05-21 DIAGNOSIS — E039 Hypothyroidism, unspecified: Secondary | ICD-10-CM | POA: Diagnosis not present

## 2018-05-21 DIAGNOSIS — N390 Urinary tract infection, site not specified: Secondary | ICD-10-CM | POA: Diagnosis not present

## 2018-05-21 DIAGNOSIS — R350 Frequency of micturition: Secondary | ICD-10-CM | POA: Diagnosis not present

## 2018-06-17 ENCOUNTER — Ambulatory Visit: Payer: PPO | Admitting: Podiatry

## 2018-06-19 ENCOUNTER — Ambulatory Visit (INDEPENDENT_AMBULATORY_CARE_PROVIDER_SITE_OTHER): Payer: PPO | Admitting: Podiatry

## 2018-06-19 ENCOUNTER — Encounter: Payer: Self-pay | Admitting: Podiatry

## 2018-06-19 DIAGNOSIS — B351 Tinea unguium: Secondary | ICD-10-CM | POA: Diagnosis not present

## 2018-06-19 DIAGNOSIS — M79676 Pain in unspecified toe(s): Secondary | ICD-10-CM | POA: Diagnosis not present

## 2018-06-19 NOTE — Progress Notes (Signed)
Complaint:  Visit Type: Patient returns to my office for continued preventative foot care services. Complaint: Patient states" my nails have grown long and thick and become painful to walk and wear shoes"  The patient presents for preventative foot care services. No changes to ROS  Podiatric Exam: Vascular: dorsalis pedis and posterior tibial pulses are palpable bilateral. Capillary return is immediate. Temperature gradient is WNL. Skin turgor WNL  Sensorium: Normal Semmes Weinstein monofilament test. Normal tactile sensation bilaterally. Nail Exam: Pt has thick disfigured discolored nails with subungual debris noted bilateral entire nail hallux through fifth toenails Ulcer Exam: There is no evidence of ulcer or pre-ulcerative changes or infection. Orthopedic Exam: Muscle tone and strength are WNL. No limitations in general ROM. No crepitus or effusions noted. Foot type and digits show no abnormalities. 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 Skin: No Porokeratosis. No infection or ulcers  Diagnosis:  Onychomycosis, , Pain in right toe, pain in left toes  Treatment & Plan Procedures and Treatment: Consent by patient was obtained for treatment procedures.   Debridement of mycotic and hypertrophic toenails, 1 through 5 bilateral and clearing of subungual debris. No ulceration, no infection noted.  Return Visit-Office Procedure: Patient instructed to return to the office for a follow up visit 3 months for continued evaluation and treatment.    Helane Gunther DPM

## 2018-07-14 ENCOUNTER — Ambulatory Visit: Payer: PPO | Admitting: Podiatry

## 2018-07-25 ENCOUNTER — Inpatient Hospital Stay: Payer: PPO | Attending: Oncology

## 2018-07-25 DIAGNOSIS — D509 Iron deficiency anemia, unspecified: Secondary | ICD-10-CM | POA: Insufficient documentation

## 2018-07-25 LAB — CBC
HCT: 40.6 % (ref 36.0–46.0)
Hemoglobin: 12.8 g/dL (ref 12.0–15.0)
MCH: 30.8 pg (ref 26.0–34.0)
MCHC: 31.5 g/dL (ref 30.0–36.0)
MCV: 97.8 fL (ref 80.0–100.0)
PLATELETS: 383 10*3/uL (ref 150–400)
RBC: 4.15 MIL/uL (ref 3.87–5.11)
RDW: 15.3 % (ref 11.5–15.5)
WBC: 6.5 10*3/uL (ref 4.0–10.5)
nRBC: 0 % (ref 0.0–0.2)

## 2018-07-31 DIAGNOSIS — Z79899 Other long term (current) drug therapy: Secondary | ICD-10-CM | POA: Diagnosis not present

## 2018-07-31 DIAGNOSIS — R634 Abnormal weight loss: Secondary | ICD-10-CM | POA: Diagnosis not present

## 2018-07-31 DIAGNOSIS — E039 Hypothyroidism, unspecified: Secondary | ICD-10-CM | POA: Diagnosis not present

## 2018-07-31 DIAGNOSIS — M0579 Rheumatoid arthritis with rheumatoid factor of multiple sites without organ or systems involvement: Secondary | ICD-10-CM | POA: Diagnosis not present

## 2018-09-01 DIAGNOSIS — Z96641 Presence of right artificial hip joint: Secondary | ICD-10-CM | POA: Diagnosis not present

## 2018-09-01 DIAGNOSIS — M170 Bilateral primary osteoarthritis of knee: Secondary | ICD-10-CM | POA: Diagnosis not present

## 2018-09-01 DIAGNOSIS — Z96642 Presence of left artificial hip joint: Secondary | ICD-10-CM | POA: Diagnosis not present

## 2018-09-01 DIAGNOSIS — M0579 Rheumatoid arthritis with rheumatoid factor of multiple sites without organ or systems involvement: Secondary | ICD-10-CM | POA: Diagnosis not present

## 2018-09-01 DIAGNOSIS — Z79899 Other long term (current) drug therapy: Secondary | ICD-10-CM | POA: Diagnosis not present

## 2018-09-18 ENCOUNTER — Ambulatory Visit: Payer: PPO | Admitting: Podiatry

## 2018-09-22 ENCOUNTER — Ambulatory Visit (INDEPENDENT_AMBULATORY_CARE_PROVIDER_SITE_OTHER): Payer: PPO | Admitting: Podiatry

## 2018-09-22 ENCOUNTER — Encounter: Payer: Self-pay | Admitting: Podiatry

## 2018-09-22 DIAGNOSIS — M069 Rheumatoid arthritis, unspecified: Secondary | ICD-10-CM

## 2018-09-22 DIAGNOSIS — M79676 Pain in unspecified toe(s): Secondary | ICD-10-CM | POA: Diagnosis not present

## 2018-09-22 DIAGNOSIS — B351 Tinea unguium: Secondary | ICD-10-CM

## 2018-09-22 NOTE — Progress Notes (Signed)
Complaint:  Visit Type: Patient returns to my office for continued preventative foot care services. Complaint: Patient states" my nails have grown long and thick and become painful to walk and wear shoes"  The patient presents for preventative foot care services. No changes to ROS.  Patient has rhumatoid arthritis.  Podiatric Exam: Vascular: dorsalis pedis and posterior tibial pulses are palpable bilateral. Capillary return is immediate. Temperature gradient is WNL. Skin turgor WNL  Sensorium: Normal Semmes Weinstein monofilament test. Normal tactile sensation bilaterally. Nail Exam: Pt has thick disfigured discolored nails with subungual debris noted bilateral entire nail hallux through fifth toenails Ulcer Exam: There is no evidence of ulcer or pre-ulcerative changes or infection. Orthopedic Exam: Muscle tone and strength are WNL. No limitations in general ROM. No crepitus or effusions noted. Foot type and digits show no abnormalities. 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 Skin: No Porokeratosis. No infection or ulcers  Diagnosis:  Onychomycosis, , Pain in right toe, pain in left toes  Treatment & Plan Procedures and Treatment: Consent by patient was obtained for treatment procedures.   Debridement of mycotic and hypertrophic toenails, 1 through 5 bilateral and clearing of subungual debris. No ulceration, no infection noted.  Return Visit-Office Procedure: Patient instructed to return to the office for a follow up visit 3 months for continued evaluation and treatment.    Gardiner Barefoot DPM

## 2018-10-07 DIAGNOSIS — Z96643 Presence of artificial hip joint, bilateral: Secondary | ICD-10-CM | POA: Diagnosis not present

## 2018-10-07 DIAGNOSIS — M160 Bilateral primary osteoarthritis of hip: Secondary | ICD-10-CM | POA: Diagnosis not present

## 2018-10-24 ENCOUNTER — Encounter: Payer: Self-pay | Admitting: Oncology

## 2018-10-24 ENCOUNTER — Other Ambulatory Visit: Payer: Self-pay | Admitting: *Deleted

## 2018-10-24 ENCOUNTER — Inpatient Hospital Stay: Payer: PPO | Attending: Oncology | Admitting: Oncology

## 2018-10-24 ENCOUNTER — Inpatient Hospital Stay: Payer: PPO

## 2018-10-24 ENCOUNTER — Other Ambulatory Visit: Payer: Self-pay

## 2018-10-24 VITALS — BP 138/77 | HR 78 | Temp 99.0°F | Resp 12 | Ht 63.0 in | Wt 113.5 lb

## 2018-10-24 DIAGNOSIS — D509 Iron deficiency anemia, unspecified: Secondary | ICD-10-CM

## 2018-10-24 DIAGNOSIS — M069 Rheumatoid arthritis, unspecified: Secondary | ICD-10-CM | POA: Diagnosis not present

## 2018-10-24 DIAGNOSIS — I10 Essential (primary) hypertension: Secondary | ICD-10-CM | POA: Diagnosis not present

## 2018-10-24 DIAGNOSIS — D638 Anemia in other chronic diseases classified elsewhere: Secondary | ICD-10-CM | POA: Insufficient documentation

## 2018-10-24 DIAGNOSIS — D890 Polyclonal hypergammaglobulinemia: Secondary | ICD-10-CM | POA: Diagnosis not present

## 2018-10-24 DIAGNOSIS — E039 Hypothyroidism, unspecified: Secondary | ICD-10-CM | POA: Diagnosis not present

## 2018-10-24 LAB — CBC
HEMATOCRIT: 40.5 % (ref 36.0–46.0)
Hemoglobin: 13.1 g/dL (ref 12.0–15.0)
MCH: 31.4 pg (ref 26.0–34.0)
MCHC: 32.3 g/dL (ref 30.0–36.0)
MCV: 97.1 fL (ref 80.0–100.0)
NRBC: 0 % (ref 0.0–0.2)
PLATELETS: 368 10*3/uL (ref 150–400)
RBC: 4.17 MIL/uL (ref 3.87–5.11)
RDW: 14.1 % (ref 11.5–15.5)
WBC: 6.3 10*3/uL (ref 4.0–10.5)

## 2018-10-24 LAB — IRON AND TIBC
Iron: 22 ug/dL — ABNORMAL LOW (ref 28–170)
SATURATION RATIOS: 13 % (ref 10.4–31.8)
TIBC: 174 ug/dL — AB (ref 250–450)
UIBC: 152 ug/dL

## 2018-10-24 LAB — VITAMIN B12: VITAMIN B 12: 576 pg/mL (ref 180–914)

## 2018-10-24 LAB — FERRITIN: Ferritin: 861 ng/mL — ABNORMAL HIGH (ref 11–307)

## 2018-10-24 NOTE — Progress Notes (Signed)
Patient here for follow up. Her arthritis is worse but she denies chills Dyspnea, low energy.

## 2018-10-27 NOTE — Progress Notes (Signed)
Hematology/Oncology Consult note [facility]  [phone] [phone]  Patient Care Team: [name], MD as PCP - General (Family Medicine)   Name of the patient: [name]  [id number]  12-31-1940   Date of visit: 10/27/18  Diagnosis- anemia likely due to iron deficiency and hypothyroidismas well as anemia of chronic disease  Chief complaint/ Reason for visit-routine follow-up of anemia  Heme/Onc history: patient is a 78 year old female with a past medical history significant for hyperlipidemia, osteoporosis and arthritis. Recent blood work from 11/14/2016 showed B-12 level of 450. CBC showed white count of 8.3, H&H of 9.8/34 with an MCV of 77.4 and platelet count of 553. Her hemoglobin has been ranging around 9 05/26/2016 and a platelet count has been in the 500ssince then as well. BMP showed elevated blood sugar of 200 and mild hyponatremia. BUN and creatinine were within normal limits. Iron studies revealed decreased iron of less than 10, iron saturation of less than 4%, TIBC of 252. Urinalysis did not reveal any hematuria.  Patient lives alone and is independent of her ADLs but her mobility is limited because of her bilateral leg swelling. She takes Celebrex daily for arthritis. Denies any routine use of other NSAIDs. Denies any blood in her stno family history of colon cancerools.  Further blood work from 11/30/2016 was as follows: CBC showed hemoglobin of 10.4/32.2 with an MCV of 69.4 and a platelet count of 559. TSH was elevated at 11.7. Haptoglobin was elevated at 3 CN reticulocyte count was low at 0.9. H pylori stool antigen was negative. Celiac disease panel was within normal limits. Multiple myeloma panel revealed polyclonal gammopathy.  Patient was referred to GI but refused any endoscopy. She received 2 doses of ferahemewith improvement of her anemia in the past  Interval history-she continues to have pain in her  joints because of her rheumatoid arthritis.  She is on methotrexate weekly for the same.  Reports some mild fatigue.  Appetite is good and she denies any unintentional weight loss  ECOG PS- 2 Pain scale- 4  Review of systems- Review of Systems  Constitutional: Positive for malaise/fatigue. Negative for chills, fever and weight loss.  HENT: Negative for congestion, ear discharge and nosebleeds.   Eyes: Negative for blurred vision.  Respiratory: Negative for cough, hemoptysis, sputum production, shortness of breath and wheezing.   Cardiovascular: Negative for chest pain, palpitations, orthopnea and claudication.  Gastrointestinal: Negative for abdominal pain, blood in stool, constipation, diarrhea, heartburn, melena, nausea and vomiting.  Genitourinary: Negative for dysuria, flank pain, frequency, hematuria and urgency.  Musculoskeletal: Positive for joint pain. Negative for back pain and myalgias.  Skin: Negative for rash.  Neurological: Negative for dizziness, tingling, focal weakness, seizures, weakness and headaches.  Endo/Heme/Allergies: Does not bruise/bleed easily.  Psychiatric/Behavioral: Negative for depression and suicidal ideas. The patient does not have insomnia.       No Known Allergies   Past Medical History:  Diagnosis Date  . Anemia   . Arthritis   . Hypertension      Past Surgical History:  Procedure Laterality Date  . APPENDECTOMY    . JOINT REPLACEMENT Right 2014   THR  . TONSILLECTOMY    . 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 Marital status: Widowed    Spouse name: Not  on file  . Number of children: Not on file  . Years of education: Not on file  . Highest education level: Not on file  Occupational History  . Not on file  Social Needs  . Financial resource strain: Not on  file  . Food insecurity:    Worry: Not on file    Inability: Not on file  . Transportation needs:    Medical: Not on file    Non-medical: Not on file  Tobacco Use  . Smoking status: Never Smoker  . Smokeless tobacco: Never Used  Substance and Sexual Activity  . Alcohol use: No  . Drug use: No  . Sexual activity: Never  Lifestyle  . Physical activity:    Days per week: Not on file    Minutes per session: Not on file  . Stress: Not on file  Relationships  . Social connections:    Talks on phone: Not on file    Gets together: Not on file    Attends religious service: Not on file    Active member of club or organization: Not on file    Attends meetings of clubs or organizations: Not on file    Relationship status: Not on file  . Intimate partner violence:    Fear of current or ex partner: Not on file    Emotionally abused: Not on file    Physically abused: Not on file    Forced sexual activity: Not on file  Other Topics Concern  . Not on file  Social History Narrative  . Not on file    Family History  Problem Relation Age of Onset  . Pancreatic cancer Mother   . Rheum arthritis Father   . Congestive Heart Failure Father   . Pancreatic cancer Maternal Aunt   . Heart attack Maternal Grandfather   . Arthritis Paternal Grandmother   . Arthritis Paternal Grandfather      Current Outpatient Medications:  .  acetaminophen (TYLENOL) 500 MG tablet, Take 500 mg by mouth 3 (three) times daily. , Disp: , Rfl:  .  aspirin EC 81 MG tablet, Take 81 mg by mouth daily. , Disp: , Rfl:  .  cholecalciferol (VITAMIN D) 1000 units tablet, Take 1,000 Units by mouth daily., Disp: , Rfl:  .  diclofenac sodium (VOLTAREN) 1 % GEL, Apply 2 g topically 3 (three) times daily as needed. , Disp: , Rfl:  .  ferrous sulfate 324 (65 Fe) MG TBEC, Take 1 tablet by mouth 2 (two) times daily. , Disp: , Rfl:  .  folic acid (FOLVITE) 1 MG tablet, Take 1 mg by mouth daily., Disp: , Rfl:  .  levothyroxine  (SYNTHROID, LEVOTHROID) 75 MCG tablet, , Disp: , Rfl:  .  methotrexate (RHEUMATREX) 2.5 MG tablet, Take 15 mg by mouth once a week. , Disp: , Rfl:  .  predniSONE (DELTASONE) 5 MG tablet, Take 7.5 mg by mouth daily. , Disp: , Rfl:  .  UNABLE TO FIND, Take by mouth., Disp: , Rfl:  .  vitamin B-12 (CYANOCOBALAMIN) 1000 MCG tablet, Take 1,000 mcg by mouth daily., Disp: , Rfl:   Current Facility-Administered Medications:  .  betamethasone acetate-betamethasone sodium phosphate (CELESTONE) injection 3 mg, 3 mg, Intramuscular, Once, Evans, Brent M, DPM .  betamethasone acetate-betamethasone sodium phosphate (CELESTONE) injection 3 mg, 3 mg, Intramuscular, Once, Edrick Kins, [place]  Physical exam:  Vitals:   10/24/18 1419 10/24/18 1425  BP:  138/77  Pulse:  78  Resp:  12   Temp:  99 F (37.2 C)  TempSrc:  Tympanic  Weight: 113 lb 8 oz (51.5 kg)   Height: _0  (1.6 m)    Physical Exam Constitutional:      Comments: Thin elderly female in no acute distress  HENT:     Head: Normocephalic and atraumatic.  Eyes:     Pupils: Pupils are equal, round, and reactive to light.  Neck:     Musculoskeletal: Normal range of motion.  Cardiovascular:     Rate and Rhythm: Normal rate and regular rhythm.     Heart sounds: Normal heart sounds.  Pulmonary:     Effort: Pulmonary effort is normal.     Breath sounds: Normal breath sounds.  Abdominal:     General: Bowel sounds are normal.     Palpations: Abdomen is soft.  Musculoskeletal:     Comments: Joint deformity seen over bilateral hands from rheumatoid arthritis  Skin:    General: Skin is warm and dry.  Neurological:     Mental Status: She is alert and oriented to person, place, and time.      CMP Latest Ref Rng & Units 09/02/2015  Glucose 65 - 99 mg/dL 94  BUN 6 - 20 mg/dL 7  Creatinine 0.44 - 1.00 mg/dL 0.62  Sodium 135 - 145 mmol/L 135  Potassium 3.5 - 5.1 mmol/L 3.7  Chloride 101 - 111 mmol/L 101  CO2 22 - 32 mmol/L 27  Calcium 8.9  - 10.3 mg/dL 8.2(L)   CBC Latest Ref Rng & Units 10/24/2018  WBC 4.0 - 10.5 K/uL 6.3  Hemoglobin 12.0 - 15.0 g/dL 13.1  Hematocrit 36.0 - 46.0 % 40.5  Platelets 150 - 400 K/uL 368     Assessment and plan- Patient is a 78 y.o. female with normocytic anemia probably secondary to chronic disease  Patient's hemoglobin is remarkably improved to 13.1 today.  Her iron studies are normal.  Elevated ferritin likely indicated of ongoing inflammation from rheumatoid arthritis.  B12 levels are normal.  Repeat CBC ferritin iron studies in 3 in 6 months and I will see her back in 6 months   Visit Diagnosis 1. Anemia of chronic disease      Dr. Randa Evens, MD, MPH [facility] at [facility] [phone] 10/27/2018 12:19 PM

## 2018-10-28 DIAGNOSIS — Z79899 Other long term (current) drug therapy: Secondary | ICD-10-CM | POA: Diagnosis not present

## 2018-11-24 DIAGNOSIS — E039 Hypothyroidism, unspecified: Secondary | ICD-10-CM | POA: Diagnosis not present

## 2018-11-24 DIAGNOSIS — M0579 Rheumatoid arthritis with rheumatoid factor of multiple sites without organ or systems involvement: Secondary | ICD-10-CM | POA: Diagnosis not present

## 2018-11-24 DIAGNOSIS — D72829 Elevated white blood cell count, unspecified: Secondary | ICD-10-CM | POA: Diagnosis not present

## 2018-11-24 DIAGNOSIS — R21 Rash and other nonspecific skin eruption: Secondary | ICD-10-CM | POA: Diagnosis not present

## 2018-12-22 ENCOUNTER — Ambulatory Visit: Payer: PPO | Admitting: Podiatry

## 2018-12-29 ENCOUNTER — Other Ambulatory Visit: Payer: Self-pay

## 2018-12-29 ENCOUNTER — Ambulatory Visit (INDEPENDENT_AMBULATORY_CARE_PROVIDER_SITE_OTHER): Payer: PPO | Admitting: Podiatry

## 2018-12-29 ENCOUNTER — Encounter: Payer: Self-pay | Admitting: Podiatry

## 2018-12-29 VITALS — Temp 96.1°F

## 2018-12-29 DIAGNOSIS — M069 Rheumatoid arthritis, unspecified: Secondary | ICD-10-CM

## 2018-12-29 DIAGNOSIS — B351 Tinea unguium: Secondary | ICD-10-CM | POA: Diagnosis not present

## 2018-12-29 DIAGNOSIS — M79676 Pain in unspecified toe(s): Secondary | ICD-10-CM

## 2018-12-29 NOTE — Progress Notes (Signed)
Complaint:  Visit Type: Patient returns to my office for continued preventative foot care services. Complaint: Patient states" my nails have grown long and thick and become painful to walk and wear shoes"  The patient presents for preventative foot care services. No changes to ROS.  Patient has rhumatoid arthritis.  Podiatric Exam: Vascular: dorsalis pedis and posterior tibial pulses are palpable bilateral. Capillary return is immediate. Temperature gradient is WNL. Skin turgor WNL  Sensorium: Normal Semmes Weinstein monofilament test. Normal tactile sensation bilaterally. Nail Exam: Pt has thick disfigured discolored nails with subungual debris noted bilateral entire nail hallux through fifth toenails Ulcer Exam: There is no evidence of ulcer or pre-ulcerative changes or infection. Orthopedic Exam: Muscle tone and strength are WNL. No limitations in general ROM. No crepitus or effusions noted. Foot type and digits show no abnormalities. 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 Skin: No Porokeratosis. No infection or ulcers  Diagnosis:  Onychomycosis, , Pain in right toe, pain in left toes  Treatment & Plan Procedures and Treatment: Consent by patient was obtained for treatment procedures.   Debridement of mycotic and hypertrophic toenails, 1 through 5 bilateral and clearing of subungual debris. No ulceration, no infection noted.  Return Visit-Office Procedure: Patient instructed to return to the office for a follow up visit 10 weeks  for continued evaluation and treatment.    Gardiner Barefoot DPM

## 2018-12-31 ENCOUNTER — Other Ambulatory Visit
Admission: RE | Admit: 2018-12-31 | Discharge: 2018-12-31 | Disposition: A | Discharge status: Home / Self Care | Payer: PPO | Source: Ambulatory Visit | Attending: Internal Medicine | Admitting: Internal Medicine

## 2018-12-31 DIAGNOSIS — M170 Bilateral primary osteoarthritis of knee: Secondary | ICD-10-CM | POA: Insufficient documentation

## 2018-12-31 DIAGNOSIS — M160 Bilateral primary osteoarthritis of hip: Secondary | ICD-10-CM | POA: Diagnosis not present

## 2018-12-31 DIAGNOSIS — Z79899 Other long term (current) drug therapy: Secondary | ICD-10-CM | POA: Diagnosis not present

## 2018-12-31 DIAGNOSIS — M25561 Pain in right knee: Secondary | ICD-10-CM | POA: Diagnosis not present

## 2018-12-31 DIAGNOSIS — M0579 Rheumatoid arthritis with rheumatoid factor of multiple sites without organ or systems involvement: Secondary | ICD-10-CM | POA: Diagnosis not present

## 2018-12-31 LAB — SYNOVIAL CELL COUNT + DIFF, W/ CRYSTALS
Crystals, Fluid: NONE SEEN
Eosinophils-Synovial: 0 %
Lymphocytes-Synovial Fld: 9 %
Monocyte-Macrophage-Synovial Fluid: 3 %
Neutrophil, Synovial: 88 %
WBC, Synovial: 24948 /mm3 — ABNORMAL HIGH (ref 0–200)

## 2019-01-23 IMAGING — CT CT ABD-PELV W/ CM
1 of 3 series · 14 of 32 positions shown, 19 images · IV contrast (APPLIED)
Comparison: None.

CLINICAL DATA: 20 pound unexplained weight loss. Bilateral lower
extremity swelling.

EXAM:
CT ABDOMEN AND PELVIS WITH CONTRAST
TECHNIQUE: Multidetector CT imaging of the abdomen and pelvis was performed
using the standard protocol following bolus administration of
intravenous contrast.
CONTRAST:  75mL 7NITEP-WLL IOPAMIDOL (7NITEP-WLL) INJECTION 61%

[Series 2: axial st · axial · 0.67mm/px · z∈[-903,-503]mm · 14 of 90 slices shown, 19 images]
[im 5/90  soft-tissue]
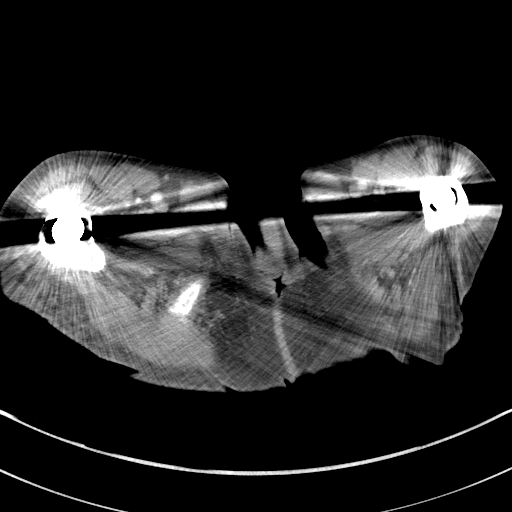
[im 5/90  bone]
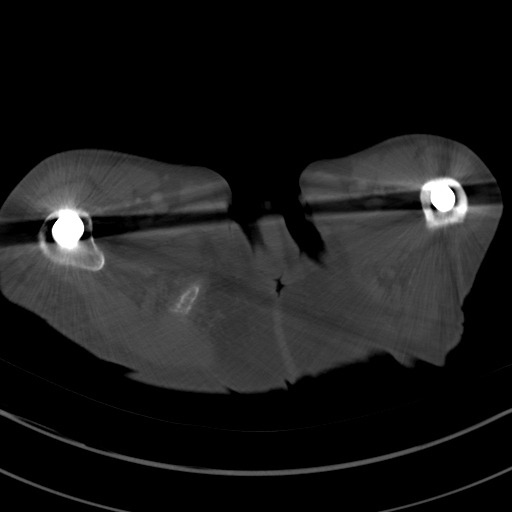
[im 10/90  soft-tissue]
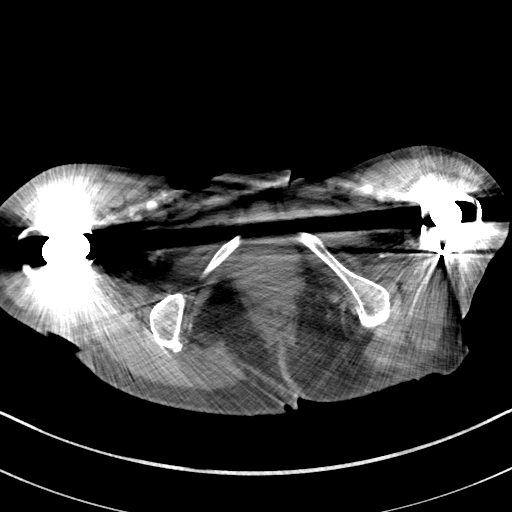
[im 25/90  soft-tissue]
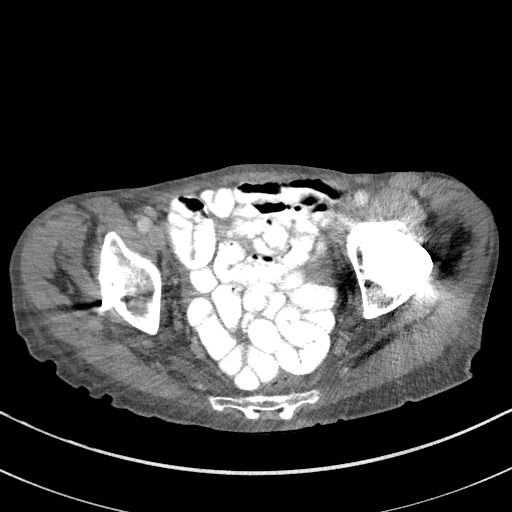
[im 30/90  soft-tissue]
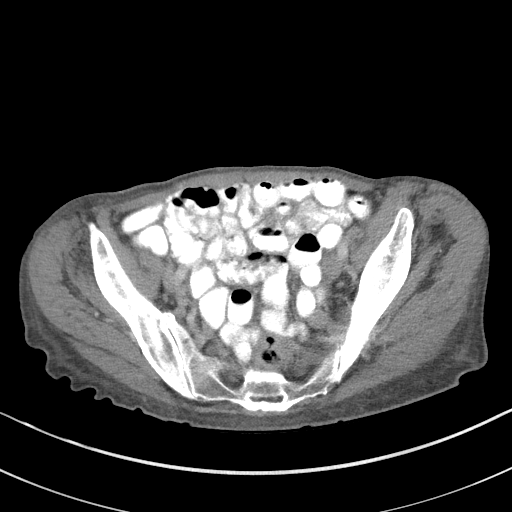
[im 35/90  soft-tissue]
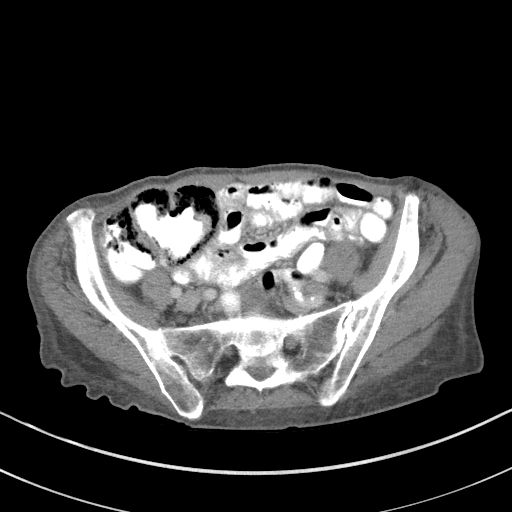
[im 40/90  soft-tissue]
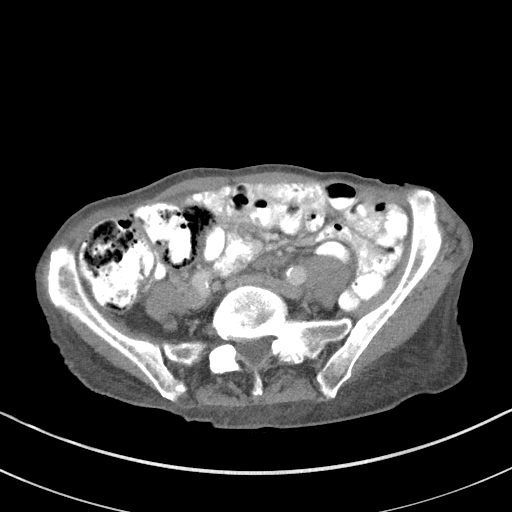
[im 50/90  soft-tissue]
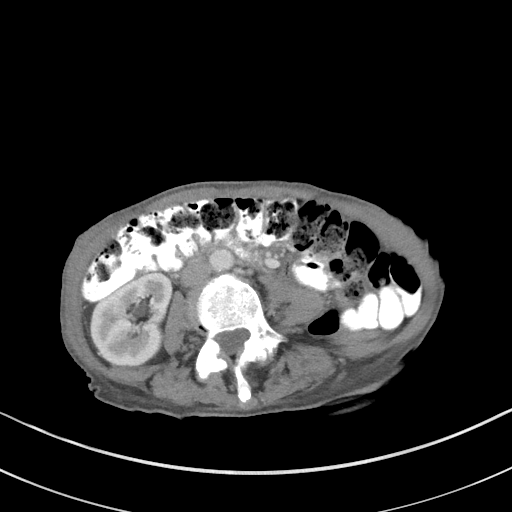
[im 55/90  soft-tissue]
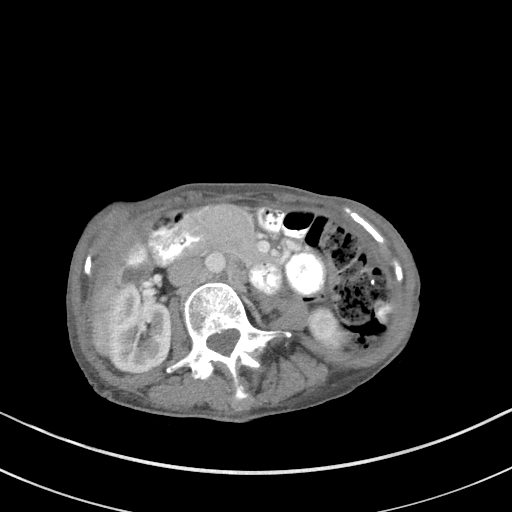
[im 60/90  soft-tissue]
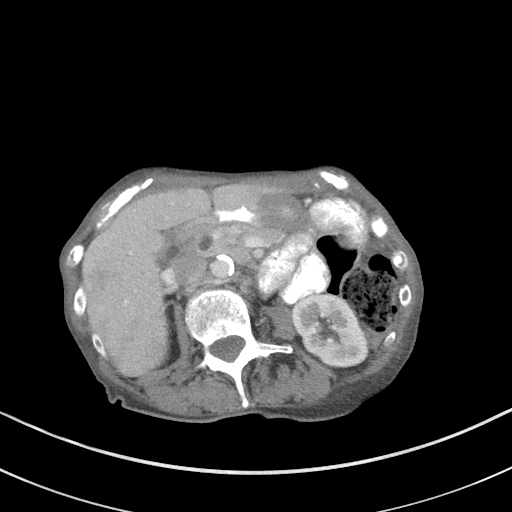
[im 60/90  bone]
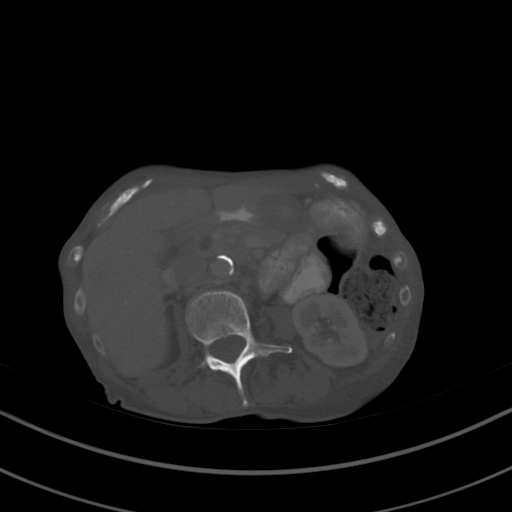
[im 65/90  soft-tissue]
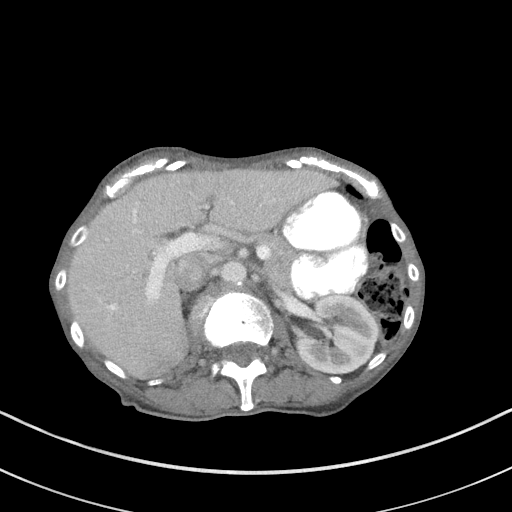
[im 70/90  soft-tissue]
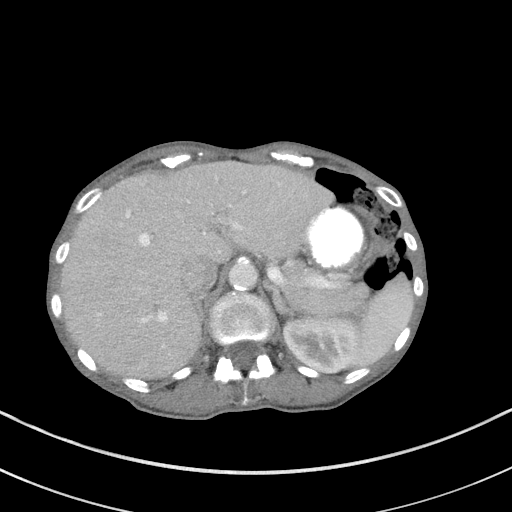
[im 70/90  lung]
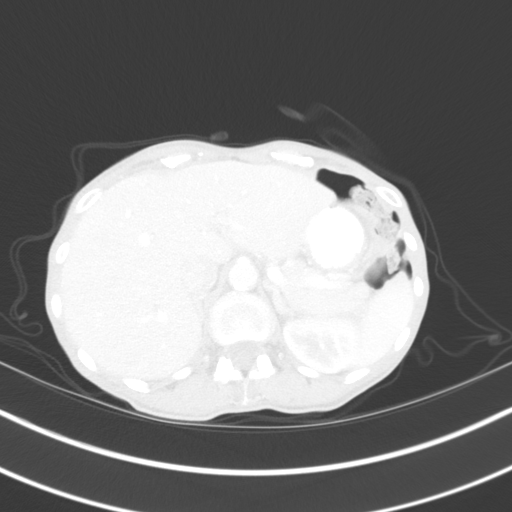
[im 75/90  lung]
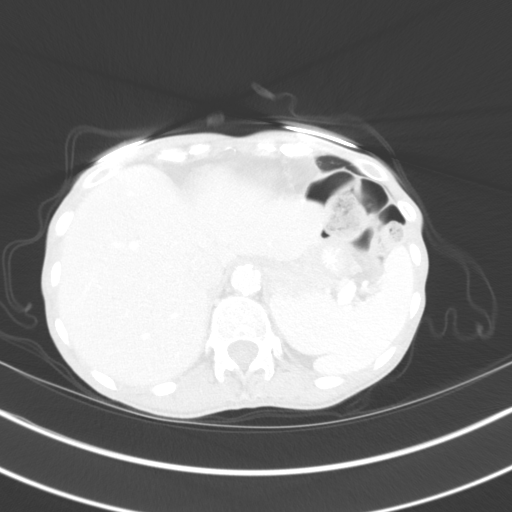
[im 80/90  soft-tissue]
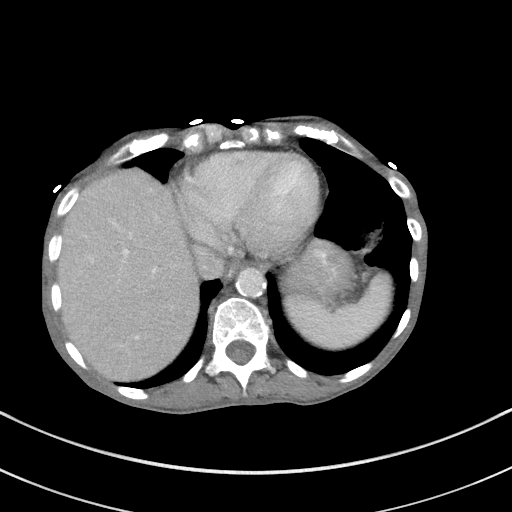
[im 80/90  lung]
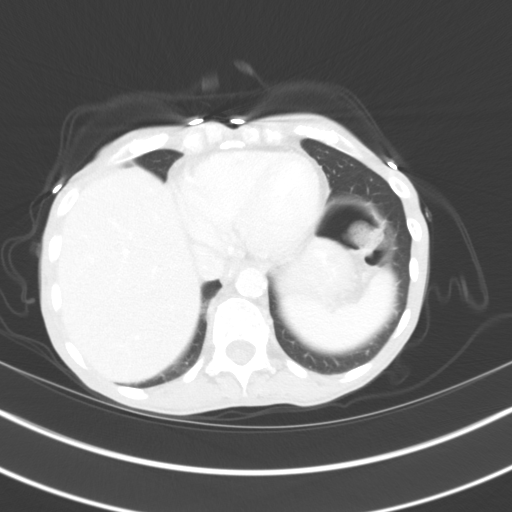
[im 85/90  soft-tissue]
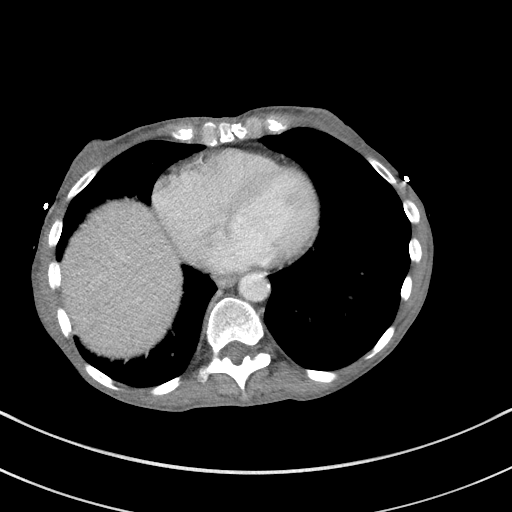
[im 85/90  lung]
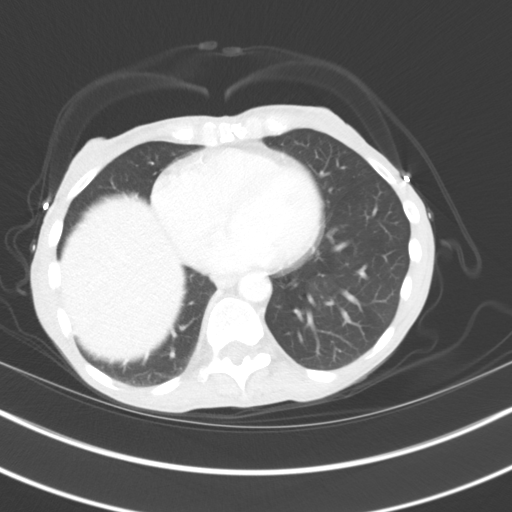

[14 of 32 positions shown; findings below may reference images not displayed]

FINDINGS: Lower chest: Lung bases are clear. No effusions. Heart is normal
size.

Hepatobiliary: Areas of low-density scattered throughout the right
hepatic lobe and in the left hepatic lobe adjacent to the falciform
ligament. This may reflect areas of focal fatty infiltration. No
well-defined mass. Gallbladder is unremarkable.

Pancreas: No focal abnormality or ductal dilatation.

Spleen: No focal abnormality.  Normal size.

Adrenals/Urinary Tract: No adrenal abnormality. No focal renal
abnormality. No stones or hydronephrosis. Urinary bladder is
unremarkable. Urinary bladder is difficult to visualize due to beam
hardening artifact from bilateral hip replacements.

Stomach/Bowel: Large stool burden throughout the colon. No evidence
of bowel obstruction.

Vascular/Lymphatic: Scattered aortic and iliac calcifications. No
aneurysm. No visible adenopathy.

Reproductive: Beam hardening artifact from bilateral hip
replacements obscures the lower pelvic structures.

Other: No free fluid or free air.

Musculoskeletal: No acute bony abnormality. Bilateral hip
replacements noted.
IMPRESSION: Areas of ill-defined low-density in the inferior right hepatic lobe
and adjacent to the falciform ligament, most likely geographic areas
of focal fatty infiltration.

Large stool burden throughout the colon.

Aortoiliac atherosclerosis.

Bilateral hip replacements. Beam hardening artifact from the hip
replacements obscure pelvic structures.

## 2019-01-26 ENCOUNTER — Other Ambulatory Visit: Payer: Self-pay

## 2019-01-26 ENCOUNTER — Inpatient Hospital Stay: Payer: PPO | Attending: Oncology

## 2019-01-26 DIAGNOSIS — D509 Iron deficiency anemia, unspecified: Secondary | ICD-10-CM | POA: Insufficient documentation

## 2019-01-26 DIAGNOSIS — Z800 Family history of malignant neoplasm of digestive organs: Secondary | ICD-10-CM | POA: Diagnosis not present

## 2019-01-26 DIAGNOSIS — E039 Hypothyroidism, unspecified: Secondary | ICD-10-CM | POA: Diagnosis not present

## 2019-01-26 DIAGNOSIS — M1990 Unspecified osteoarthritis, unspecified site: Secondary | ICD-10-CM | POA: Diagnosis not present

## 2019-01-26 DIAGNOSIS — Z8249 Family history of ischemic heart disease and other diseases of the circulatory system: Secondary | ICD-10-CM | POA: Diagnosis not present

## 2019-01-26 DIAGNOSIS — R5383 Other fatigue: Secondary | ICD-10-CM | POA: Diagnosis not present

## 2019-01-26 DIAGNOSIS — M2550 Pain in unspecified joint: Secondary | ICD-10-CM | POA: Insufficient documentation

## 2019-01-26 DIAGNOSIS — D890 Polyclonal hypergammaglobulinemia: Secondary | ICD-10-CM | POA: Insufficient documentation

## 2019-01-26 DIAGNOSIS — Z8261 Family history of arthritis: Secondary | ICD-10-CM | POA: Diagnosis not present

## 2019-01-26 LAB — CBC
HCT: 39.7 % (ref 36.0–46.0)
Hemoglobin: 12.6 g/dL (ref 12.0–15.0)
MCH: 30.3 pg (ref 26.0–34.0)
MCHC: 31.7 g/dL (ref 30.0–36.0)
MCV: 95.4 fL (ref 80.0–100.0)
Platelets: 263 10*3/uL (ref 150–400)
RBC: 4.16 MIL/uL (ref 3.87–5.11)
RDW: 15 % (ref 11.5–15.5)
WBC: 6.7 10*3/uL (ref 4.0–10.5)
nRBC: 0 % (ref 0.0–0.2)

## 2019-01-26 LAB — IRON AND TIBC
Iron: 18 ug/dL — ABNORMAL LOW (ref 28–170)
Saturation Ratios: 10 % — ABNORMAL LOW (ref 10.4–31.8)
TIBC: 183 ug/dL — ABNORMAL LOW (ref 250–450)
UIBC: 165 ug/dL

## 2019-01-26 LAB — FERRITIN: Ferritin: 761 ng/mL — ABNORMAL HIGH (ref 11–307)

## 2019-02-17 DIAGNOSIS — M25552 Pain in left hip: Secondary | ICD-10-CM | POA: Diagnosis not present

## 2019-02-17 DIAGNOSIS — Z96643 Presence of artificial hip joint, bilateral: Secondary | ICD-10-CM | POA: Diagnosis not present

## 2019-03-09 ENCOUNTER — Other Ambulatory Visit: Payer: Self-pay

## 2019-03-09 ENCOUNTER — Ambulatory Visit (INDEPENDENT_AMBULATORY_CARE_PROVIDER_SITE_OTHER): Payer: PPO | Admitting: Podiatry

## 2019-03-09 ENCOUNTER — Encounter: Payer: Self-pay | Admitting: Podiatry

## 2019-03-09 VITALS — Temp 99.1°F

## 2019-03-09 DIAGNOSIS — M79676 Pain in unspecified toe(s): Secondary | ICD-10-CM

## 2019-03-09 DIAGNOSIS — B351 Tinea unguium: Secondary | ICD-10-CM | POA: Diagnosis not present

## 2019-03-09 DIAGNOSIS — M069 Rheumatoid arthritis, unspecified: Secondary | ICD-10-CM

## 2019-03-09 NOTE — Progress Notes (Addendum)
Complaint:  Visit Type: Patient returns to my office for continued preventative foot care services. Complaint: Patient states" my nails have grown long and thick and become painful to walk and wear shoes"  The patient presents for preventative foot care services. No changes to ROS.  Patient has rhumatoid arthritis.  Podiatric Exam: Vascular: dorsalis pedis and posterior tibial pulses are palpable bilateral. Capillary return is immediate. Temperature gradient is WNL. Skin turgor WNL  Sensorium: Normal Semmes Weinstein monofilament test. Normal tactile sensation bilaterally. Nail Exam: Pt has thick disfigured discolored nails with subungual debris noted bilateral entire nail hallux through fifth toenails Ulcer Exam: There is no evidence of ulcer or pre-ulcerative changes or infection. Orthopedic Exam: Muscle tone and strength are WNL. No limitations in general ROM. No crepitus or effusions noted. Foot type and digits show no abnormalities. 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  Hammer toes/contracted digits  Especially fourth hammer toe left foot. Skin: No Porokeratosis. No infection or ulcers  Diagnosis:  Onychomycosis, , Pain in right toe, pain in left toes  Treatment & Plan Procedures and Treatment: Consent by patient was obtained for treatment procedures.   Debridement of mycotic and hypertrophic toenails, 1 through 5 bilateral and clearing of subungual debris. No ulceration, no infection noted. Padding dispensed. Return Visit-Office Procedure: Patient instructed to return to the office for a follow up visit 10 weeks  for continued evaluation and treatment.    Gardiner Barefoot DPM

## 2019-03-10 DIAGNOSIS — Z79899 Other long term (current) drug therapy: Secondary | ICD-10-CM | POA: Diagnosis not present

## 2019-03-10 DIAGNOSIS — M0579 Rheumatoid arthritis with rheumatoid factor of multiple sites without organ or systems involvement: Secondary | ICD-10-CM | POA: Diagnosis not present

## 2019-03-13 DIAGNOSIS — E039 Hypothyroidism, unspecified: Secondary | ICD-10-CM | POA: Diagnosis not present

## 2019-03-13 DIAGNOSIS — M0579 Rheumatoid arthritis with rheumatoid factor of multiple sites without organ or systems involvement: Secondary | ICD-10-CM | POA: Diagnosis not present

## 2019-04-07 DIAGNOSIS — Z79899 Other long term (current) drug therapy: Secondary | ICD-10-CM | POA: Diagnosis not present

## 2019-04-07 DIAGNOSIS — M1711 Unilateral primary osteoarthritis, right knee: Secondary | ICD-10-CM | POA: Diagnosis not present

## 2019-04-07 DIAGNOSIS — M170 Bilateral primary osteoarthritis of knee: Secondary | ICD-10-CM | POA: Diagnosis not present

## 2019-04-07 DIAGNOSIS — M0579 Rheumatoid arthritis with rheumatoid factor of multiple sites without organ or systems involvement: Secondary | ICD-10-CM | POA: Diagnosis not present

## 2019-04-13 DIAGNOSIS — Z79899 Other long term (current) drug therapy: Secondary | ICD-10-CM | POA: Diagnosis not present

## 2019-04-13 DIAGNOSIS — M0579 Rheumatoid arthritis with rheumatoid factor of multiple sites without organ or systems involvement: Secondary | ICD-10-CM | POA: Diagnosis not present

## 2019-04-13 DIAGNOSIS — R7612 Nonspecific reaction to cell mediated immunity measurement of gamma interferon antigen response without active tuberculosis: Secondary | ICD-10-CM | POA: Diagnosis not present

## 2019-04-24 ENCOUNTER — Other Ambulatory Visit: Payer: Self-pay

## 2019-04-24 DIAGNOSIS — D509 Iron deficiency anemia, unspecified: Secondary | ICD-10-CM

## 2019-04-28 ENCOUNTER — Encounter: Payer: Self-pay | Admitting: Oncology

## 2019-04-28 ENCOUNTER — Inpatient Hospital Stay: Payer: PPO | Attending: Oncology

## 2019-04-28 ENCOUNTER — Other Ambulatory Visit: Payer: Self-pay

## 2019-04-28 ENCOUNTER — Inpatient Hospital Stay (HOSPITAL_BASED_OUTPATIENT_CLINIC_OR_DEPARTMENT_OTHER): Payer: PPO | Admitting: Oncology

## 2019-04-28 VITALS — BP 176/82 | HR 67 | Temp 97.3°F | Resp 18 | Wt 115.0 lb

## 2019-04-28 DIAGNOSIS — Z8249 Family history of ischemic heart disease and other diseases of the circulatory system: Secondary | ICD-10-CM | POA: Diagnosis not present

## 2019-04-28 DIAGNOSIS — M069 Rheumatoid arthritis, unspecified: Secondary | ICD-10-CM | POA: Diagnosis not present

## 2019-04-28 DIAGNOSIS — D509 Iron deficiency anemia, unspecified: Secondary | ICD-10-CM

## 2019-04-28 DIAGNOSIS — M2550 Pain in unspecified joint: Secondary | ICD-10-CM | POA: Diagnosis not present

## 2019-04-28 DIAGNOSIS — M810 Age-related osteoporosis without current pathological fracture: Secondary | ICD-10-CM | POA: Diagnosis not present

## 2019-04-28 DIAGNOSIS — E785 Hyperlipidemia, unspecified: Secondary | ICD-10-CM | POA: Diagnosis not present

## 2019-04-28 DIAGNOSIS — D638 Anemia in other chronic diseases classified elsewhere: Secondary | ICD-10-CM | POA: Insufficient documentation

## 2019-04-28 DIAGNOSIS — Z79899 Other long term (current) drug therapy: Secondary | ICD-10-CM | POA: Insufficient documentation

## 2019-04-28 DIAGNOSIS — Z800 Family history of malignant neoplasm of digestive organs: Secondary | ICD-10-CM | POA: Diagnosis not present

## 2019-04-28 DIAGNOSIS — M7989 Other specified soft tissue disorders: Secondary | ICD-10-CM | POA: Insufficient documentation

## 2019-04-28 DIAGNOSIS — E611 Iron deficiency: Secondary | ICD-10-CM | POA: Diagnosis not present

## 2019-04-28 DIAGNOSIS — R739 Hyperglycemia, unspecified: Secondary | ICD-10-CM | POA: Insufficient documentation

## 2019-04-28 DIAGNOSIS — G8929 Other chronic pain: Secondary | ICD-10-CM | POA: Diagnosis not present

## 2019-04-28 DIAGNOSIS — E039 Hypothyroidism, unspecified: Secondary | ICD-10-CM | POA: Insufficient documentation

## 2019-04-28 DIAGNOSIS — D890 Polyclonal hypergammaglobulinemia: Secondary | ICD-10-CM | POA: Insufficient documentation

## 2019-04-28 DIAGNOSIS — Z8261 Family history of arthritis: Secondary | ICD-10-CM | POA: Diagnosis not present

## 2019-04-28 DIAGNOSIS — E871 Hypo-osmolality and hyponatremia: Secondary | ICD-10-CM | POA: Diagnosis not present

## 2019-04-28 LAB — FERRITIN: Ferritin: 769 ng/mL — ABNORMAL HIGH (ref 11–307)

## 2019-04-28 LAB — IRON AND TIBC
Iron: 36 ug/dL (ref 28–170)
Saturation Ratios: 18 % (ref 10.4–31.8)
TIBC: 196 ug/dL — ABNORMAL LOW (ref 250–450)
UIBC: 160 ug/dL

## 2019-04-28 LAB — CBC WITH DIFFERENTIAL/PLATELET
Abs Immature Granulocytes: 0.04 10*3/uL (ref 0.00–0.07)
Basophils Absolute: 0 10*3/uL (ref 0.0–0.1)
Basophils Relative: 0 %
Eosinophils Absolute: 0 10*3/uL (ref 0.0–0.5)
Eosinophils Relative: 0 %
HCT: 38.5 % (ref 36.0–46.0)
Hemoglobin: 12.2 g/dL (ref 12.0–15.0)
Immature Granulocytes: 0 %
Lymphocytes Relative: 4 %
Lymphs Abs: 0.3 10*3/uL — ABNORMAL LOW (ref 0.7–4.0)
MCH: 30.4 pg (ref 26.0–34.0)
MCHC: 31.7 g/dL (ref 30.0–36.0)
MCV: 96 fL (ref 80.0–100.0)
Monocytes Absolute: 0.4 10*3/uL (ref 0.1–1.0)
Monocytes Relative: 5 %
Neutro Abs: 8.2 10*3/uL — ABNORMAL HIGH (ref 1.7–7.7)
Neutrophils Relative %: 91 %
Platelets: 209 10*3/uL (ref 150–400)
RBC: 4.01 MIL/uL (ref 3.87–5.11)
RDW: 15.6 % — ABNORMAL HIGH (ref 11.5–15.5)
WBC: 9 10*3/uL (ref 4.0–10.5)
nRBC: 0 % (ref 0.0–0.2)

## 2019-04-28 NOTE — Progress Notes (Signed)
Pt here for f/u no complaints today

## 2019-04-28 NOTE — Progress Notes (Signed)
Hematology/Oncology Consult note [facility]  [phone] Fax:([phone]  Patient Care Team: [name], MD as PCP - General (Family Medicine)   Name of the patient: [name]  [id number]  1941-07-01   Date of visit: 04/28/19  Diagnosis- anemia likely due to iron deficiency and hypothyroidismas well as anemia of chronic disease   Chief complaint/ Reason for visit-routine follow-up of anemia  Heme/Onc history: patient is a 78 year old female with a past medical history significant for hyperlipidemia, osteoporosis and arthritis. Recent blood work from 11/14/2016 showed B-12 level of 450. CBC showed white count of 8.3, H&H of 9.8/34 with an MCV of 77.4 and platelet count of 553. Her hemoglobin has been ranging around 9 05/26/2016 and a platelet count has been in the 500ssince then as well. BMP showed elevated blood sugar of 200 and mild hyponatremia. BUN and creatinine were within normal limits. Iron studies revealed decreased iron of less than 10, iron saturation of less than 4%, TIBC of 252. Urinalysis did not reveal any hematuria.  Patient lives alone and is independent of her ADLs but her mobility is limited because of her bilateral leg swelling. She takes Celebrex daily for arthritis. Denies any routine use of other NSAIDs. Denies any blood in her stno family history of colon cancerools.  Further blood work from 11/30/2016 was as follows: CBC showed hemoglobin of 10.4/32.2 with an MCV of 69.4 and a platelet count of 559. TSH was elevated at 11.7. Haptoglobin was elevated at 3 CN reticulocyte count was low at 0.9. H pylori stool antigen was negative. Celiac disease panel was within normal limits. Multiple myeloma panel revealed polyclonal gammopathy.  Patient was referred to GI but refused any endoscopy. She received 2 doses of ferahemewith improvement of her anemia in the past  Interval history-she has problems with chronic  joint pain because of her rheumatoid arthritis which is overall stable.  Reports that her energy levels are good.  ECOG PS- 1 Pain scale- 0   Review of systems- Review of Systems  Constitutional: Negative for chills, fever, malaise/fatigue and weight loss.  HENT: Negative for congestion, ear discharge and nosebleeds.   Eyes: Negative for blurred vision.  Respiratory: Negative for cough, hemoptysis, sputum production, shortness of breath and wheezing.   Cardiovascular: Negative for chest pain, palpitations, orthopnea and claudication.  Gastrointestinal: Negative for abdominal pain, blood in stool, constipation, diarrhea, heartburn, melena, nausea and vomiting.  Genitourinary: Negative for dysuria, flank pain, frequency, hematuria and urgency.  Musculoskeletal: Positive for joint pain. Negative for back pain and myalgias.  Skin: Negative for rash.  Neurological: Negative for dizziness, tingling, focal weakness, seizures, weakness and headaches.  Endo/Heme/Allergies: Does not bruise/bleed easily.  Psychiatric/Behavioral: Negative for depression and suicidal ideas. The patient does not have insomnia.        No Known Allergies   Past Medical History:  Diagnosis Date  . Anemia   . Arthritis   . Hypertension      Past Surgical History:  Procedure Laterality Date  . APPENDECTOMY    . JOINT REPLACEMENT Right 2014   THR  . TONSILLECTOMY    . 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 Marital status: Widowed    Spouse name: Not on file  . Number of children: Not on file  . Years  of education: Not on file  . Highest education level: Not on file  Occupational History  . Not on file  Social Needs  . Financial resource strain: Not on file  . Food insecurity    Worry: Not on file    Inability: Not on file   . Transportation needs    Medical: Not on file    Non-medical: Not on file  Tobacco Use  . Smoking status: Never Smoker  . Smokeless tobacco: Never Used  Substance and Sexual Activity  . Alcohol use: No  . Drug use: No  . Sexual activity: Never  Lifestyle  . Physical activity    Days per week: Not on file    Minutes per session: Not on file  . Stress: Not on file  Relationships  . Social Herbalist on phone: Not on file    Gets together: Not on file    Attends religious service: Not on file    Active member of club or organization: Not on file    Attends meetings of clubs or organizations: Not on file    Relationship status: Not on file  . Intimate partner violence    Fear of current or ex partner: Not on file    Emotionally abused: Not on file    Physically abused: Not on file    Forced sexual activity: Not on file  Other Topics Concern  . Not on file  Social History Narrative  . Not on file    Family History  Problem Relation Age of Onset  . Pancreatic cancer Mother   . Rheum arthritis Father   . Congestive Heart Failure Father   . Pancreatic cancer Maternal Aunt   . Heart attack Maternal Grandfather   . Arthritis Paternal Grandmother   . Arthritis Paternal Grandfather      Current Outpatient Medications:  .  acetaminophen (TYLENOL) 500 MG tablet, Take 500 mg by mouth 2 (two) times daily. , Disp: , Rfl:  .  aspirin EC 81 MG tablet, Take 81 mg by mouth daily. , Disp: , Rfl:  .  cholecalciferol (VITAMIN D) 1000 units tablet, Take 1,000 Units by mouth daily., Disp: , Rfl:  .  diclofenac sodium (VOLTAREN) 1 % GEL, Apply 2 g topically 3 (three) times daily as needed. , Disp: , Rfl:  .  ferrous sulfate 324 (65 Fe) MG TBEC, Take 1 tablet by mouth daily. , Disp: , Rfl:  .  folic acid (FOLVITE) 1 MG tablet, Take 1 mg by mouth daily., Disp: , Rfl:  .  levothyroxine (SYNTHROID, LEVOTHROID) 75 MCG tablet, Take 75 mcg by mouth daily before breakfast. , Disp: ,  Rfl:  .  methotrexate (RHEUMATREX) 2.5 MG tablet, Take 15 mg by mouth once a week. , Disp: , Rfl:  .  predniSONE (DELTASONE) 5 MG tablet, Take 5 mg by mouth daily. , Disp: , Rfl:  .  vitamin B-12 (CYANOCOBALAMIN) 1000 MCG tablet, Take 1,000 mcg by mouth daily., Disp: , Rfl:   Current Facility-Administered Medications:  .  betamethasone acetate-betamethasone sodium phosphate (CELESTONE) injection 3 mg, 3 mg, Intramuscular, Once, Evans, Brent M, DPM .  betamethasone acetate-betamethasone sodium phosphate (CELESTONE) injection 3 mg, 3 mg, Intramuscular, Once, Edrick Kins, DPM  Physical exam:  Vitals:   04/28/19 1002 04/28/19 1036  BP:  (!) 176/82  Pulse: 67   Resp: 18   Temp: (!) 97.3 F (36.3 C)   TempSrc: Tympanic   Weight: 115 lb (52.2  kg) 115 lb (52.2 kg)   Physical Exam Constitutional:      Comments: Thin elderly female in no acute distress  HENT:     Head: Normocephalic and atraumatic.  Eyes:     Pupils: Pupils are equal, round, and reactive to light.  Neck:     Musculoskeletal: Normal range of motion.  Cardiovascular:     Rate and Rhythm: Normal rate and regular rhythm.     Heart sounds: Normal heart sounds.  Pulmonary:     Effort: Pulmonary effort is normal.     Breath sounds: Normal breath sounds.  Abdominal:     General: Bowel sounds are normal.     Palpations: Abdomen is soft.  Musculoskeletal:     Comments: Chronic joint deformity from rheumatoid arthritis noted in bilateral hands  Skin:    General: Skin is warm and dry.  Neurological:     Mental Status: She is alert and oriented to person, place, and time.      CMP Latest Ref Rng & Units 09/02/2015  Glucose 65 - 99 mg/dL 94  BUN 6 - 20 mg/dL 7  Creatinine 0.44 - 1.00 mg/dL 0.62  Sodium 135 - 145 mmol/L 135  Potassium 3.5 - 5.1 mmol/L 3.7  Chloride 101 - 111 mmol/L 101  CO2 22 - 32 mmol/L 27  Calcium 8.9 - 10.3 mg/dL 8.2(L)   CBC Latest Ref Rng & Units 04/28/2019  WBC 4.0 - 10.5 K/uL 9.0  Hemoglobin  12.0 - 15.0 g/dL 12.2  Hematocrit 36.0 - 46.0 % 38.5  Platelets 150 - 400 K/uL 209      Assessment and plan- Patient is a 78 y.o. female with anemia of chronic disease and a component of iron deficiency anemia  She is currently not anemic.  Iron studies are normal.  She does not require IV iron at this time.Hemoglobin is remained stable between 11-12 over the last 1 year.  CBC ferritin and iron studies in 3 in 6 months and I will see her back in 6 months   Visit Diagnosis 1. Anemia of chronic disease   2. Iron deficiency anemia, unspecified iron deficiency anemia type      Dr. Randa Evens, MD, MPH [facility] at [facility] [phone] 04/28/2019 2:09 PM

## 2019-05-18 ENCOUNTER — Other Ambulatory Visit: Payer: Self-pay

## 2019-05-18 ENCOUNTER — Encounter: Payer: Self-pay | Admitting: Podiatry

## 2019-05-18 ENCOUNTER — Ambulatory Visit (INDEPENDENT_AMBULATORY_CARE_PROVIDER_SITE_OTHER): Payer: PPO | Admitting: Podiatry

## 2019-05-18 DIAGNOSIS — M069 Rheumatoid arthritis, unspecified: Secondary | ICD-10-CM

## 2019-05-18 DIAGNOSIS — M79676 Pain in unspecified toe(s): Secondary | ICD-10-CM | POA: Diagnosis not present

## 2019-05-18 DIAGNOSIS — B351 Tinea unguium: Secondary | ICD-10-CM

## 2019-05-18 NOTE — Progress Notes (Signed)
Complaint:  Visit Type: Patient returns to my office for continued preventative foot care services. Complaint: Patient states" my nails have grown long and thick and become painful to walk and wear shoes"  The patient presents for preventative foot care services. No changes to ROS.  Patient has rhumatoid arthritis.  Podiatric Exam: Vascular: dorsalis pedis and posterior tibial pulses are palpable bilateral. Capillary return is immediate. Temperature gradient is WNL. Skin turgor WNL  Sensorium: Normal Semmes Weinstein monofilament test. Normal tactile sensation bilaterally. Nail Exam: Pt has thick disfigured discolored nails with subungual debris noted bilateral entire nail hallux through fifth toenails Ulcer Exam: There is no evidence of ulcer or pre-ulcerative changes or infection. Orthopedic Exam: Muscle tone and strength are WNL. No limitations in general ROM. No crepitus or effusions noted. Foot type and digits show no abnormalities. 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  Hammer toes/contracted digits especially fifth toe right foot. Skin: No Porokeratosis. No infection or ulcers  Diagnosis:  Onychomycosis, , Pain in right toe, pain in left toes  Treatment & Plan Procedures and Treatment: Consent by patient was obtained for treatment procedures.   Debridement of mycotic and hypertrophic toenails, 1 through 5 bilateral and clearing of subungual debris. No ulceration, no infection noted.  Return Visit-Office Procedure: Patient instructed to return to the office for a follow up visit 10 weeks  for continued evaluation and treatment.    Gardiner Barefoot DPM

## 2019-07-09 DIAGNOSIS — Z79899 Other long term (current) drug therapy: Secondary | ICD-10-CM | POA: Diagnosis not present

## 2019-07-27 ENCOUNTER — Ambulatory Visit (INDEPENDENT_AMBULATORY_CARE_PROVIDER_SITE_OTHER): Payer: PPO | Admitting: Podiatry

## 2019-07-27 ENCOUNTER — Encounter: Payer: Self-pay | Admitting: Podiatry

## 2019-07-27 DIAGNOSIS — M79676 Pain in unspecified toe(s): Secondary | ICD-10-CM

## 2019-07-27 DIAGNOSIS — B351 Tinea unguium: Secondary | ICD-10-CM

## 2019-07-27 NOTE — Progress Notes (Signed)
Complaint:  Visit Type: Patient returns to my office for continued preventative foot care services. Complaint: Patient states" my nails have grown long and thick and become painful to walk and wear shoes"  The patient presents for preventative foot care services. No changes to ROS.  Patient has rhumatoid arthritis.  Podiatric Exam: Vascular: dorsalis pedis and posterior tibial pulses are palpable bilateral. Capillary return is immediate. Temperature gradient is WNL. Skin turgor WNL  Sensorium: Normal Semmes Weinstein monofilament test. Normal tactile sensation bilaterally. Nail Exam: Pt has thick disfigured discolored nails with subungual debris noted bilateral entire nail hallux through fifth toenails Ulcer Exam: There is no evidence of ulcer or pre-ulcerative changes or infection. Orthopedic Exam: Muscle tone and strength are WNL. No limitations in general ROM. No crepitus or effusions noted. Foot type and digits show no abnormalities. 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  Hammer toes/contracted digits especially fifth toe right foot. Skin: No Porokeratosis. No infection or ulcers  Diagnosis:  Onychomycosis, , Pain in right toe, pain in left toes  Treatment & Plan Procedures and Treatment: Consent by patient was obtained for treatment procedures.   Debridement of mycotic and hypertrophic toenails, 1 through 5 bilateral and clearing of subungual debris. No ulceration, no infection noted.  Return Visit-Office Procedure: Patient instructed to return to the office for a follow up visit 10 weeks  for continued evaluation and treatment.    Remell Giaimo DPM 

## 2019-07-28 ENCOUNTER — Inpatient Hospital Stay: Payer: PPO | Attending: Oncology

## 2019-07-28 ENCOUNTER — Other Ambulatory Visit: Payer: Self-pay

## 2019-07-28 DIAGNOSIS — D509 Iron deficiency anemia, unspecified: Secondary | ICD-10-CM | POA: Insufficient documentation

## 2019-07-28 DIAGNOSIS — D638 Anemia in other chronic diseases classified elsewhere: Secondary | ICD-10-CM

## 2019-07-28 LAB — IRON AND TIBC
Iron: 18 ug/dL — ABNORMAL LOW (ref 28–170)
Saturation Ratios: 10 % — ABNORMAL LOW (ref 10.4–31.8)
TIBC: 174 ug/dL — ABNORMAL LOW (ref 250–450)
UIBC: 156 ug/dL

## 2019-07-28 LAB — CBC WITH DIFFERENTIAL/PLATELET
Abs Immature Granulocytes: 0.03 10*3/uL (ref 0.00–0.07)
Basophils Absolute: 0 10*3/uL (ref 0.0–0.1)
Basophils Relative: 0 %
Eosinophils Absolute: 0.1 10*3/uL (ref 0.0–0.5)
Eosinophils Relative: 1 %
HCT: 38.4 % (ref 36.0–46.0)
Hemoglobin: 12.2 g/dL (ref 12.0–15.0)
Immature Granulocytes: 0 %
Lymphocytes Relative: 7 %
Lymphs Abs: 0.5 10*3/uL — ABNORMAL LOW (ref 0.7–4.0)
MCH: 30.5 pg (ref 26.0–34.0)
MCHC: 31.8 g/dL (ref 30.0–36.0)
MCV: 96 fL (ref 80.0–100.0)
Monocytes Absolute: 0.5 10*3/uL (ref 0.1–1.0)
Monocytes Relative: 7 %
Neutro Abs: 6 10*3/uL (ref 1.7–7.7)
Neutrophils Relative %: 85 %
Platelets: 218 10*3/uL (ref 150–400)
RBC: 4 MIL/uL (ref 3.87–5.11)
RDW: 14.7 % (ref 11.5–15.5)
WBC: 7.1 10*3/uL (ref 4.0–10.5)
nRBC: 0 % (ref 0.0–0.2)

## 2019-07-28 LAB — FERRITIN: Ferritin: 809 ng/mL — ABNORMAL HIGH (ref 11–307)

## 2019-08-07 DIAGNOSIS — M1711 Unilateral primary osteoarthritis, right knee: Secondary | ICD-10-CM | POA: Diagnosis not present

## 2019-08-07 DIAGNOSIS — M160 Bilateral primary osteoarthritis of hip: Secondary | ICD-10-CM | POA: Diagnosis not present

## 2019-08-07 DIAGNOSIS — Z79899 Other long term (current) drug therapy: Secondary | ICD-10-CM | POA: Diagnosis not present

## 2019-08-07 DIAGNOSIS — M0579 Rheumatoid arthritis with rheumatoid factor of multiple sites without organ or systems involvement: Secondary | ICD-10-CM | POA: Diagnosis not present

## 2019-10-01 DIAGNOSIS — Z96643 Presence of artificial hip joint, bilateral: Secondary | ICD-10-CM | POA: Diagnosis not present

## 2019-10-01 DIAGNOSIS — M160 Bilateral primary osteoarthritis of hip: Secondary | ICD-10-CM | POA: Diagnosis not present

## 2019-10-05 ENCOUNTER — Ambulatory Visit: Payer: PPO | Admitting: Podiatry

## 2019-10-08 ENCOUNTER — Ambulatory Visit: Payer: PPO | Admitting: Podiatry

## 2019-10-15 ENCOUNTER — Ambulatory Visit (INDEPENDENT_AMBULATORY_CARE_PROVIDER_SITE_OTHER): Payer: PPO | Admitting: Podiatry

## 2019-10-15 ENCOUNTER — Encounter: Payer: Self-pay | Admitting: Podiatry

## 2019-10-15 ENCOUNTER — Other Ambulatory Visit: Payer: Self-pay

## 2019-10-15 DIAGNOSIS — M069 Rheumatoid arthritis, unspecified: Secondary | ICD-10-CM

## 2019-10-15 DIAGNOSIS — M79641 Pain in right hand: Secondary | ICD-10-CM | POA: Diagnosis not present

## 2019-10-15 DIAGNOSIS — B351 Tinea unguium: Secondary | ICD-10-CM | POA: Diagnosis not present

## 2019-10-15 DIAGNOSIS — M79676 Pain in unspecified toe(s): Secondary | ICD-10-CM | POA: Diagnosis not present

## 2019-10-15 DIAGNOSIS — M170 Bilateral primary osteoarthritis of knee: Secondary | ICD-10-CM | POA: Diagnosis not present

## 2019-10-15 DIAGNOSIS — Z79899 Other long term (current) drug therapy: Secondary | ICD-10-CM | POA: Diagnosis not present

## 2019-10-15 DIAGNOSIS — M79642 Pain in left hand: Secondary | ICD-10-CM | POA: Diagnosis not present

## 2019-10-15 DIAGNOSIS — M160 Bilateral primary osteoarthritis of hip: Secondary | ICD-10-CM | POA: Diagnosis not present

## 2019-10-15 DIAGNOSIS — M0579 Rheumatoid arthritis with rheumatoid factor of multiple sites without organ or systems involvement: Secondary | ICD-10-CM | POA: Diagnosis not present

## 2019-10-15 NOTE — Progress Notes (Signed)
Complaint:  Visit Type: Patient returns to my office for continued preventative foot care services. Complaint: Patient states" my nails have grown long and thick and become painful to walk and wear shoes"  The patient presents for preventative foot care services. No changes to ROS.  Patient has rhumatoid arthritis.  Podiatric Exam: Vascular: dorsalis pedis and posterior tibial pulses are palpable bilateral. Capillary return is immediate. Temperature gradient is WNL. Skin turgor WNL  Sensorium: Normal Semmes Weinstein monofilament test. Normal tactile sensation bilaterally. Nail Exam: Pt has thick disfigured discolored nails with subungual debris noted bilateral entire nail hallux through fifth toenails Ulcer Exam: There is no evidence of ulcer or pre-ulcerative changes or infection. Orthopedic Exam: Muscle tone and strength are WNL. No limitations in general ROM. No crepitus or effusions noted. Foot type and digits show no abnormalities. 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  Hammer toes/contracted digits especially fifth toe right foot. Skin: No Porokeratosis. No infection or ulcers  Diagnosis:  Onychomycosis, , Pain in right toe, pain in left toes  Treatment & Plan Procedures and Treatment: Consent by patient was obtained for treatment procedures.   Debridement of mycotic and hypertrophic toenails, 1 through 5 bilateral and clearing of subungual debris. No ulceration, no infection noted.  Return Visit-Office Procedure: Patient instructed to return to the office for a follow up visit 10 weeks  for continued evaluation and treatment.    [name] DPM 

## 2019-10-16 ENCOUNTER — Ambulatory Visit: Payer: PPO | Attending: Internal Medicine

## 2019-10-16 DIAGNOSIS — Z23 Encounter for immunization: Secondary | ICD-10-CM | POA: Insufficient documentation

## 2019-10-16 NOTE — Progress Notes (Signed)
   Covid-19 Vaccination Clinic  Name:  MAUDINE KLUESNER    MRN: [id number] DOB: [birth year]  10/16/2019  Ms. Bowerman was observed post Covid-19 immunization for 15 minutes without incidence. She was provided with Vaccine Information Sheet and instruction to access the V-Safe system.   Ms. Innocent was instructed to call 911 with any severe reactions post vaccine: Marland Kitchen Difficulty breathing  . Swelling of your face and throat  . A fast heartbeat  . A bad rash all over your body  . Dizziness and weakness    Immunizations Administered    Name Date Dose VIS Date Route   Pfizer COVID-19 Vaccine 10/16/2019 10:10 AM 0.3 mL 07/31/2019 Intramuscular   Manufacturer: [employer], [employer]   Lot: [id number]   NDC: [address]-5

## 2019-10-23 ENCOUNTER — Other Ambulatory Visit: Payer: Self-pay

## 2019-10-23 ENCOUNTER — Inpatient Hospital Stay: Payer: PPO | Attending: Oncology

## 2019-10-23 ENCOUNTER — Inpatient Hospital Stay (HOSPITAL_BASED_OUTPATIENT_CLINIC_OR_DEPARTMENT_OTHER): Payer: PPO | Admitting: Oncology

## 2019-10-23 ENCOUNTER — Encounter: Payer: Self-pay | Admitting: Oncology

## 2019-10-23 VITALS — BP 183/87 | HR 62 | Temp 97.0°F | Resp 18 | Wt 111.1 lb

## 2019-10-23 DIAGNOSIS — M069 Rheumatoid arthritis, unspecified: Secondary | ICD-10-CM | POA: Diagnosis not present

## 2019-10-23 DIAGNOSIS — D638 Anemia in other chronic diseases classified elsewhere: Secondary | ICD-10-CM

## 2019-10-23 DIAGNOSIS — D509 Iron deficiency anemia, unspecified: Secondary | ICD-10-CM

## 2019-10-23 DIAGNOSIS — R5382 Chronic fatigue, unspecified: Secondary | ICD-10-CM | POA: Insufficient documentation

## 2019-10-23 DIAGNOSIS — F488 Other specified nonpsychotic mental disorders: Secondary | ICD-10-CM | POA: Insufficient documentation

## 2019-10-23 LAB — CBC WITH DIFFERENTIAL/PLATELET
Abs Immature Granulocytes: 0.02 10*3/uL (ref 0.00–0.07)
Basophils Absolute: 0 10*3/uL (ref 0.0–0.1)
Basophils Relative: 0 %
Eosinophils Absolute: 0 10*3/uL (ref 0.0–0.5)
Eosinophils Relative: 0 %
HCT: 38.1 % (ref 36.0–46.0)
Hemoglobin: 11.9 g/dL — ABNORMAL LOW (ref 12.0–15.0)
Immature Granulocytes: 0 %
Lymphocytes Relative: 5 %
Lymphs Abs: 0.4 10*3/uL — ABNORMAL LOW (ref 0.7–4.0)
MCH: 30.3 pg (ref 26.0–34.0)
MCHC: 31.2 g/dL (ref 30.0–36.0)
MCV: 96.9 fL (ref 80.0–100.0)
Monocytes Absolute: 0.1 10*3/uL (ref 0.1–1.0)
Monocytes Relative: 2 %
Neutro Abs: 6.9 10*3/uL (ref 1.7–7.7)
Neutrophils Relative %: 93 %
Platelets: 360 10*3/uL (ref 150–400)
RBC: 3.93 MIL/uL (ref 3.87–5.11)
RDW: 14 % (ref 11.5–15.5)
WBC: 7.5 10*3/uL (ref 4.0–10.5)
nRBC: 0 % (ref 0.0–0.2)

## 2019-10-23 LAB — IRON AND TIBC
Iron: 24 ug/dL — ABNORMAL LOW (ref 28–170)
Saturation Ratios: 13 % (ref 10.4–31.8)
TIBC: 185 ug/dL — ABNORMAL LOW (ref 250–450)
UIBC: 161 ug/dL

## 2019-10-23 LAB — FERRITIN: Ferritin: 737 ng/mL — ABNORMAL HIGH (ref 11–307)

## 2019-10-23 NOTE — Progress Notes (Signed)
Patient stated that she had been doing well with no complaints. 

## 2019-10-23 NOTE — Progress Notes (Signed)
Hematology/Oncology Consult note [facility]  [phone] [phone]  Patient Care Team: [name], MD as PCP - General (Family Medicine)   Name of the patient: [name]  [id number]  [birth year]   Date of visit: 10/23/19  Diagnosis- anemia likely due to iron deficiency and hypothyroidismas well as anemia of chronic disease   Chief complaint/ Reason for visit-routine follow-up of anemia  Heme/Onc history: patient is a 79 year old female with a past medical history significant for hyperlipidemia, osteoporosis and arthritis. Recent blood work from 11/14/2016 showed B-12 level of 450. CBC showed white count of 8.3, H&H of 9.8/34 with an MCV of 77.4 and platelet count of 553. Her hemoglobin has been ranging around 9 05/26/2016 and a platelet count has been in the 500ssince then as well. BMP showed elevated blood sugar of 200 and mild hyponatremia. BUN and creatinine were within normal limits. Iron studies revealed decreased iron of less than 10, iron saturation of less than 4%, TIBC of 252. Urinalysis did not reveal any hematuria.  Patient lives alone and is independent of her ADLs but her mobility is limited because of her bilateral leg swelling. She takes Celebrex daily for arthritis. Denies any routine use of other NSAIDs. Denies any blood in her stno family history of colon cancerools.  Further blood work from 11/30/2016 was as follows: CBC showed hemoglobin of 10.4/32.2 with an MCV of 69.4 and a platelet count of 559. TSH was elevated at 11.7. Haptoglobin was elevated at 3 CN reticulocyte count was low at 0.9. H pylori stool antigen was negative. Celiac disease panel was within normal limits. Multiple myeloma panel revealed polyclonal gammopathy.  Patient was referred to GI but refused any endoscopy. She received 2 doses of ferahemewith improvement of her anemia in the past   Interval history-patient has chronic joint pain  from rheumatoid arthritis causing joint deformity.  Reports chronic mild fatigue.  Denies other complaints  ECOG PS- 1 Pain scale- 4  Review of systems- Review of Systems  Constitutional: Positive for malaise/fatigue. Negative for chills, fever and weight loss.  HENT: Negative for congestion, ear discharge and nosebleeds.   Eyes: Negative for blurred vision.  Respiratory: Negative for cough, hemoptysis, sputum production, shortness of breath and wheezing.   Cardiovascular: Negative for chest pain, palpitations, orthopnea and claudication.  Gastrointestinal: Negative for abdominal pain, blood in stool, constipation, diarrhea, heartburn, melena, nausea and vomiting.  Genitourinary: Negative for dysuria, flank pain, frequency, hematuria and urgency.  Musculoskeletal: Positive for joint pain. Negative for back pain and myalgias.  Skin: Negative for rash.  Neurological: Negative for dizziness, tingling, focal weakness, seizures, weakness and headaches.  Endo/Heme/Allergies: Does not bruise/bleed easily.  Psychiatric/Behavioral: Negative for depression and suicidal ideas. The patient does not have insomnia.       No Known Allergies   Past Medical History:  Diagnosis Date  . Anemia   . Arthritis   . Hypertension      Past Surgical History:  Procedure Laterality Date  . APPENDECTOMY    . JOINT REPLACEMENT Right 2014   THR  . TONSILLECTOMY    . 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 Marital status: Widowed    Spouse name: Not on file  . Number of children: Not on file  . Years of education: Not  on file  . Highest education level: Not on file  Occupational History  . Not on file  Tobacco Use  . Smoking status: Never Smoker  . Smokeless tobacco: Never Used  Substance and Sexual Activity  . Alcohol  use: No  . Drug use: No  . Sexual activity: Never  Other Topics Concern  . Not on file  Social History Narrative  . Not on file   Social Determinants of Health   Financial Resource Strain:   . Difficulty of Paying Living Expenses: Not on file  Food Insecurity:   . Worried About Charity fundraiser in the Last Year: Not on file  . Ran Out of Food in the Last Year: Not on file  Transportation Needs:   . Lack of Transportation (Medical): Not on file  . Lack of Transportation (Non-Medical): Not on file  Physical Activity:   . Days of Exercise per Week: Not on file  . Minutes of Exercise per Session: Not on file  Stress:   . Feeling of Stress : Not on file  Social Connections:   . Frequency of Communication with Friends and Family: Not on file  . Frequency of Social Gatherings with Friends and Family: Not on file  . Attends Religious Services: Not on file  . Active Member of Clubs or Organizations: Not on file  . Attends Archivist Meetings: Not on file  . Marital Status: Not on file  Intimate Partner Violence:   . Fear of Current or Ex-Partner: Not on file  . Emotionally Abused: Not on file  . Physically Abused: Not on file  . Sexually Abused: Not on file    Family History  Problem Relation Age of Onset  . Pancreatic cancer Mother   . Rheum arthritis Father   . Congestive Heart Failure Father   . Pancreatic cancer Maternal Aunt   . Heart attack Maternal Grandfather   . Arthritis Paternal Grandmother   . Arthritis Paternal Grandfather      Current Outpatient Medications:  .  acetaminophen (TYLENOL) 500 MG tablet, Take 500 mg by mouth 2 (two) times daily. , Disp: , Rfl:  .  aspirin EC 81 MG tablet, Take 81 mg by mouth daily. , Disp: , Rfl:  .  cholecalciferol (VITAMIN D) 1000 units tablet, Take 1,000 Units by mouth daily., Disp: , Rfl:  .  diclofenac sodium (VOLTAREN) 1 % GEL, Apply 2 g topically 3 (three) times daily as needed. , Disp: , Rfl:  .  etodolac  (LODINE) 500 MG tablet, Take 500 mg by mouth daily as needed., Disp: , Rfl:  .  ferrous sulfate 324 (65 Fe) MG TBEC, Take 1 tablet by mouth daily. , Disp: , Rfl:  .  folic acid (FOLVITE) 1 MG tablet, Take 1 mg by mouth daily., Disp: , Rfl:  .  hydroxychloroquine (PLAQUENIL) 200 MG tablet, One tab daily for rheumatoid arthritis, 30 tabs, Disp: , Rfl:  .  levothyroxine (SYNTHROID, LEVOTHROID) 75 MCG tablet, Take 75 mcg by mouth daily before breakfast. , Disp: , Rfl:  .  methotrexate (RHEUMATREX) 2.5 MG tablet, TAKE 6 TABLETS BY MOUTH EVERY 7 DAYS, SAME DAY EACH WEEK, Disp: , Rfl:  .  predniSONE (DELTASONE) 10 MG tablet, , Disp: , Rfl:  .  vitamin B-12 (CYANOCOBALAMIN) 1000 MCG tablet, Take 1,000 mcg by mouth daily., Disp: , Rfl:   Current Facility-Administered Medications:  .  betamethasone acetate-betamethasone sodium phosphate (CELESTONE) injection 3 mg, 3 mg, Intramuscular, Once, Evans,  Dorathy Daft, DPM .  betamethasone acetate-betamethasone sodium phosphate (CELESTONE) injection 3 mg, 3 mg, Intramuscular, Once, Edrick Kins, [place]  Physical exam:  Vitals:   10/23/19 0932  BP: (!) 183/87  Pulse: 62  Resp: 18  Temp: (!) 97 F (36.1 C)  TempSrc: Tympanic  SpO2: 100%  Weight: 111 lb 1.6 oz (50.4 kg)   Physical Exam HENT:     Head: Normocephalic and atraumatic.  Eyes:     Pupils: Pupils are equal, round, and reactive to light.  Cardiovascular:     Rate and Rhythm: Normal rate and regular rhythm.     Heart sounds: Normal heart sounds.  Pulmonary:     Effort: Pulmonary effort is normal.     Breath sounds: Normal breath sounds.  Abdominal:     General: Bowel sounds are normal.     Palpations: Abdomen is soft.  Musculoskeletal:     Cervical back: Normal range of motion.     Comments: Chronic joint deformity noted in bilateral fingers  Skin:    General: Skin is warm and dry.  Neurological:     Mental Status: She is alert and oriented to person, place, and time.      CMP Latest  Ref Rng & Units 09/02/2015  Glucose 65 - 99 mg/dL 94  BUN 6 - 20 mg/dL 7  Creatinine 0.44 - 1.00 mg/dL 0.62  Sodium 135 - 145 mmol/L 135  Potassium 3.5 - 5.1 mmol/L 3.7  Chloride 101 - 111 mmol/L 101  CO2 22 - 32 mmol/L 27  Calcium 8.9 - 10.3 mg/dL 8.2(L)   CBC Latest Ref Rng & Units 10/23/2019  WBC 4.0 - 10.5 K/uL 7.5  Hemoglobin 12.0 - 15.0 g/dL 11.9(L)  Hematocrit 36.0 - 46.0 % 38.1  Platelets 150 - 400 K/uL 360     Assessment and plan- Patient is a 79 y.o. female with anemia of chronic disease and a component of iron deficiency anemia  Lab work from today shows that her hemoglobin has remained stable between 11-12 over the last 1-1/2-year.  Today H&H is 11.9/38.1.  Iron studies show a low TIBC with elevated ferritin suggestive of anemia of chronic disease.  Patient does not require any IV iron at this time.  Given the stability of her blood work I will continue to monitor with every 4 months and I will see her back in 8 months   Visit Diagnosis 1. Anemia of chronic disease   2. Iron deficiency anemia, unspecified iron deficiency anemia type      Dr. Randa Evens, MD, MPH [facility] [phone] 10/23/2019 2:01 PM

## 2019-10-26 ENCOUNTER — Inpatient Hospital Stay: Payer: PPO | Admitting: Oncology

## 2019-10-26 ENCOUNTER — Inpatient Hospital Stay: Payer: PPO

## 2019-10-27 ENCOUNTER — Encounter: Payer: Self-pay | Admitting: Occupational Therapy

## 2019-10-27 ENCOUNTER — Ambulatory Visit: Payer: PPO | Attending: Internal Medicine | Admitting: Occupational Therapy

## 2019-10-27 ENCOUNTER — Other Ambulatory Visit: Payer: Self-pay

## 2019-10-27 DIAGNOSIS — M25642 Stiffness of left hand, not elsewhere classified: Secondary | ICD-10-CM | POA: Diagnosis not present

## 2019-10-27 DIAGNOSIS — M6281 Muscle weakness (generalized): Secondary | ICD-10-CM

## 2019-10-27 DIAGNOSIS — M79642 Pain in left hand: Secondary | ICD-10-CM | POA: Insufficient documentation

## 2019-10-27 DIAGNOSIS — M25641 Stiffness of right hand, not elsewhere classified: Secondary | ICD-10-CM | POA: Insufficient documentation

## 2019-10-27 NOTE — Therapy (Signed)
[facility] PHYSICAL AND SPORTS MEDICINE [address] [place], [address] Phone: [phone]   Fax:  [phone]  Occupational Therapy Evaluation  Patient Details  Name: [name] MRN: [id number] Date of Birth: [birth year] Referring Provider (OT): Renard Matter   Encounter Date: 10/27/2019  OT End of Session - 10/27/19 1253    Visit Number  1    Number of Visits  6    Date for OT Re-Evaluation  12/08/19    OT Start Time  1046    OT Stop Time  1147    OT Time Calculation (min)  61 min    Activity Tolerance  Patient tolerated treatment well    Behavior During Therapy  [facility] for tasks assessed/performed       Past Medical History:  Diagnosis Date  . Anemia   . Arthritis   . Hypertension     Past Surgical History:  Procedure Laterality Date  . APPENDECTOMY    . JOINT REPLACEMENT Right 2014   THR  . TONSILLECTOMY    . TOTAL HIP ARTHROPLASTY Left 08/31/2015   Procedure: TOTAL HIP ARTHROPLASTY;  Surgeon: Donato Heinz, MD;  Location: [facility] ORS;  Service: Orthopedics;  Laterality: Left;    There were no vitals filed for this visit.  Subjective Assessment - 10/27/19 1053    Subjective   My L hand has been bothering me for more than year- it was locking, then got stiff and painfull- cannot make fist- drop things - and then the r hand I cannot open - that has been longer    Pertinent History  Pt has history of RA - pt on prednisone 1 mg, plaquenil,, metratrexate and starting remicade March 15h - the last year harder time with her hands - L hand pai and stiffness and R hand cannot open fingers    Patient Stated Goals  I want to be able to make fist with my L hand and pain better- so I can do things for myself -    Currently in Pain?  Yes    Pain Score  8     Pain Location  Hand    Pain Orientation  Left    Pain Descriptors / Indicators  Numbness;Aching    Pain Type  Chronic pain    Pain Onset  More than a month ago    Pain Frequency   Intermittent    Aggravating Factors   making fist        [facility] OT Assessment - 10/27/19 0001      Assessment   Medical Diagnosis  RA with hand stiffness and pain     Referring Provider (OT)  Renard Matter    Onset Date/Surgical Date  10/19/18    Hand Dominance  Right    Next MD Visit  --   march 15th remicade infusion     Home  Environment   Lives With  Alone      Prior Function   Vocation  Retired    Leisure  do word findings, shopping , going out for meal       Strength   Right Hand Grip (lbs)  16    Right Hand Lateral Pinch  5 lbs   pain   Right Hand 3 Point Pinch  --   unable ulnar devation of digits    Left Hand Grip (lbs)  10    Left Hand Lateral Pinch  4 lbs   pain   Left Hand  3 Point Pinch  --   unable ulnar deviation of digits      Right Hand AROM   R Index  MCP 0-90  -80 Degrees    R Index PIP 0-100  90 Degrees    R Long  MCP 0-90  -90 Degrees    R Long PIP 0-100  75 Degrees    R Ring  MCP 0-90  -100 Degrees    R Ring PIP 0-100  85 Degrees    R Little  MCP 0-90  -100 Degrees    R Little PIP 0-100  85 Degrees      Left Hand AROM   L Index  MCP 0-90  75 Degrees    L Index PIP 0-100  80 Degrees   trigger   L Long  MCP 0-90  90 Degrees    L Long PIP 0-100  70 Degrees    L Ring  MCP 0-90  100 Degrees    L Ring PIP 0-100  70 Degrees   trigger   L Little  MCP 0-90  100 Degrees    L Little PIP 0-100  90 Degrees               OT Treatments/Exercises (OP) - 10/27/19 0001      RUE Paraffin   Number Minutes Paraffin  8 Minutes    RUE Paraffin Location  Hand    Comments  --   extention stretch for MC extention of R hand - palm       Heat and extention stretch in R hand 2  X day  L hand MC block splint on 2nd and 4th with gripping activities and night time to encourage PIP flexion ( fabricated and pt ed on using )  And decrease triggering  And intrinsic fist AROM block 2 x day 10 reps  and 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         OT Education - 10/27/19 1253    Education Details  findings of eval and HEP    Person(s) Educated  Patient    Methods  Explanation;Demonstration;Tactile cues;Verbal cues;Handout    Comprehension  Verbal cues required;Returned demonstration;Verbalized understanding          OT Long Term Goals - 10/27/19 1341      OT LONG TERM GOAL #1   Title  Pt to be independent in HEP to prevent flexor contrature on R hand and increase flexion of digits in L hand without triggering    Baseline  see flowsheet- trigger in 2nd and 4th every time she made fist or was place and hold in fist    Time  6    Period  Weeks    Status  New    Target Date  12/08/19      OT LONG TERM GOAL #2   Title  Pain decrease to less than  4/10 in L hand    Baseline  pain increase to 8/10 in L hand    Time  6    Period  Weeks    Status  New    Target Date  12/08/19            Plan - 10/27/19 1255    Clinical Impression Statement  Pt present with diagnosis of RA in bilateral hands - report stiffness for more than year in L hand - increase pain and functional use - pt tender over A1pulley of 2nd and 4th - triggering and lost  PIP flexion with over flexion at [facility] - pain increase to 8/10 with fisting - R dominant hand show extensor tendon subluxation at [facility] of all digits- with decrease MC extention - causing decrease prehensio and grip - decrease functional use - pt also report numbness in 2nd thru 4th digits on L hand - recommend refer to ortho with pt for bilateral hands - but do not want surgery - discuss shots for L hand - and pt open for that - will contact Dr Renard Matter - HEP provided for pt and will follow up with pt in 10 days -    Occupational performance deficits (Please refer to evaluation for details):  ADL's;IADL's;Play;Leisure;Social Participation    Body Structure / Function / Physical Skills  ADL;Flexibility;ROM;UE functional use;FMC;Pain;IADL;Strength    Rehab Potential  Fair    Clinical Decision  Making  Limited treatment options, no task modification necessary    Comorbidities Affecting Occupational Performance:  May have comorbidities impacting occupational performance   RA in bilateral hands- subluxation in R hand extensor tendons, L hand trigger fiingers long standing   Modification or Assistance to Complete Evaluation   No modification of tasks or assist necessary to complete eval    OT Frequency  1x / week    OT Duration  6 weeks    OT Treatment/Interventions  Self-care/ADL training;Splinting;Patient/family education;Manual Therapy;Passive range of motion;Contrast Bath;Paraffin;Therapeutic exercise    Plan  assess if pt got shots for L hand- and progress in HEP    OT Home Exercise Plan  see pt instructions    Consulted and Agree with Plan of Care  Patient       Patient will benefit from skilled therapeutic intervention in order to improve the following deficits and impairments:   Body Structure / Function / Physical Skills: ADL, Flexibility, ROM, UE functional use, FMC, Pain, IADL, Strength       Visit Diagnosis: Stiffness of left hand, not elsewhere classified - Plan: Ot plan of care cert/re-cert  Stiffness of right hand, not elsewhere classified - Plan: Ot plan of care cert/re-cert  Pain in left hand - Plan: Ot plan of care cert/re-cert  Muscle weakness (generalized) - Plan: Ot plan of care cert/re-cert    Problem List Patient Active Problem List   Diagnosis Date Noted  . Right anterior knee pain 05/01/2018  . 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 Swelling of right knee joint 10/01/2017  . Encounter for long-term (current) use of high-risk medication 02/28/2017  . Rheumatoid arthritis involving multiple sites (HCC) 02/28/2017  . Unintentional weight loss 02/28/2017  . Bilateral hand pain 02/14/2017  . Acquired hypothyroidism 01/21/2017  . Osteoarthritis of both knees 01/21/2017  . Iron deficiency anemia 11/30/2016  . 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 Trochanteric bursitis of left hip 06/13/2015  . Hyperlipidemia, unspecified 05/03/2015    Oletta Cohn OTR/L,CLT 10/27/2019, 1:45 PM  [facility] PHYSICAL AND SPORTS MEDICINE [address] [place], [address] Phone: [phone]   Fax:  [phone]  Name: [name] MRN: [id number] Date of Birth: 1940/12/18

## 2019-10-27 NOTE — Patient Instructions (Signed)
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

## 2019-11-02 DIAGNOSIS — M05741 Rheumatoid arthritis with rheumatoid factor of right hand without organ or systems involvement: Secondary | ICD-10-CM | POA: Diagnosis not present

## 2019-11-02 DIAGNOSIS — M0579 Rheumatoid arthritis with rheumatoid factor of multiple sites without organ or systems involvement: Secondary | ICD-10-CM | POA: Diagnosis not present

## 2019-11-02 DIAGNOSIS — H2513 Age-related nuclear cataract, bilateral: Secondary | ICD-10-CM | POA: Diagnosis not present

## 2019-11-02 DIAGNOSIS — Z79899 Other long term (current) drug therapy: Secondary | ICD-10-CM | POA: Diagnosis not present

## 2019-11-05 ENCOUNTER — Ambulatory Visit: Payer: PPO | Admitting: Occupational Therapy

## 2019-11-10 ENCOUNTER — Other Ambulatory Visit: Payer: Self-pay

## 2019-11-10 ENCOUNTER — Ambulatory Visit: Payer: PPO | Admitting: Occupational Therapy

## 2019-11-10 DIAGNOSIS — M25642 Stiffness of left hand, not elsewhere classified: Secondary | ICD-10-CM

## 2019-11-10 DIAGNOSIS — M6281 Muscle weakness (generalized): Secondary | ICD-10-CM

## 2019-11-10 DIAGNOSIS — M79642 Pain in left hand: Secondary | ICD-10-CM

## 2019-11-10 DIAGNOSIS — M25641 Stiffness of right hand, not elsewhere classified: Secondary | ICD-10-CM

## 2019-11-10 NOTE — Therapy (Signed)
[facility] [address], [place], [address] Phone: [phone]   Fax:  [phone]  Occupational Therapy Treatment  Patient Details  Name: [name] MRN: [id number] Date of Birth: 1941-07-27 Referring Provider (OT): Renard Matter   Encounter Date: 11/10/2019  OT End of Session - 11/10/19 1404    Visit Number  2    Number of Visits  6    Date for OT Re-Evaluation  12/08/19    OT Start Time  1336    OT Stop Time  1429    OT Time Calculation (min)  53 min    Activity Tolerance  Patient tolerated treatment well    Behavior During Therapy  [facility] for tasks assessed/performed       Past Medical History:  Diagnosis Date  . Anemia   . Arthritis   . Hypertension     Past Surgical History:  Procedure Laterality Date  . APPENDECTOMY    . JOINT REPLACEMENT Right 2014   THR  . TONSILLECTOMY    . TOTAL HIP ARTHROPLASTY Left 08/31/2015   Procedure: TOTAL HIP ARTHROPLASTY;  Surgeon: Donato Heinz, MD;  Location: [facility] ORS;  Service: Orthopedics;  Laterality: Left;    There were no vitals filed for this visit.  Subjective Assessment - 11/10/19 1402    Subjective   I had my first infusion last week - done well -my L hand feels better and less pain -but R hand about the same    Pertinent History  Pt has history of RA - pt on prednisone 10 mg, plaquenil,, metratrexate and starting remicade March 15h - the last year harder time with her hands - L hand pai and stiffness and R hand cannot open fingers    Patient Stated Goals  I want to be able to make fist with my L hand and pain better- so I can do things for myself -    Currently in Pain?  Yes    Pain Score  5     Pain Location  Hand    Pain Orientation  Left    Pain Descriptors / Indicators  Numbness;Aching    Pain Type  Chronic pain    Pain Onset  More than a month ago    Pain Frequency  Intermittent         [facility] OT Assessment - 11/10/19 0001      Strength    Right Hand Grip (lbs)  20    Right Hand Lateral Pinch  5.5 lbs    Left Hand Grip (lbs)  15    Left Hand Lateral Pinch  5 lbs      Left Hand AROM   L Index  MCP 0-90  75 Degrees    L Index PIP 0-100  90 Degrees    L Long  MCP 0-90  90 Degrees    L Long PIP 0-100  80 Degrees    L Ring  MCP 0-90  100 Degrees    L Ring PIP 0-100  70 Degrees    L Little  MCP 0-90  100 Degrees    L Little PIP 0-100  100 Degrees      Pt had first Plaquenil infusion last week -and returning this date doing HEP  Did not want shots in L hand for trigger fingers  She report decrease pain from 8 to 5/10 in L hand  And show increase AROM - see flowsheet And increase grip  strength bilateral hands   R hand same with subluxation of extensor tendons of 3rd thru 5th           OT Treatments/Exercises (OP) - 11/10/19 0001      RUE Paraffin   Number Minutes Paraffin  8 Minutes    RUE Paraffin Location  Hand    Comments  --   prior to soft tissue      LUE Paraffin   Number Minutes Paraffin  8 Minutes    LUE Paraffin Location  Hand    Comments  prior to soft tissue       Done some soft tissue mobs to volar hands - palms and digits - graston tool nr 2 for brushing  And then [facility] spreads and gentle MC joint mobs  AAROM to L hand for PIP flexion and blocked intrinsic fist reinforce - need max A for review of HEP  And place and hold composite fist - extention digits stretch for R hand  Modify L 4th digit MC block splint -was to high and not allowing enough PIP flexion - better fit afterwards - pt to use during day with composite fisting act  She says - she cannot sleep with them         OT Education - 11/10/19 1404    Person(s) Educated  Patient    Methods  Explanation;Demonstration;Tactile cues;Verbal cues;Handout    Comprehension  Verbal cues required;Returned demonstration;Verbalized understanding          OT Long Term Goals - 10/27/19 1341      OT LONG TERM GOAL #1   Title  Pt to  be independent in HEP to prevent flexor contrature on R hand and increase flexion of digits in L hand without triggering    Baseline  see flowsheet- trigger in 2nd and 4th every time she made fist or was place and hold in fist    Time  6    Period  Weeks    Status  New    Target Date  12/08/19      OT LONG TERM GOAL #2   Title  Pain decrease to less than  4/10 in L hand    Baseline  pain increase to 8/10 in L hand    Time  6    Period  Weeks    Status  New    Target Date  12/08/19            Plan - 11/10/19 1434    Clinical Impression Statement  Pt with diagnosis of RA in bilateral hands show decrease stiffness and triggering in L hand this date - less pain. She had her first Plaquenil infusion last week - showed increase grip strength in bilateral hands - but explain to pt about subluxation of extensor tendons in R hand - and that we can only make sure she do not develop flexor contracture at [facility] but cannot reverse it with therapy - would need surgery - pt benefit from OT services and will follow up in 2 wks    OT Occupational Profile and History  Problem Focused Assessment - Including review of records relating to presenting problem    Occupational performance deficits (Please refer to evaluation for details):  ADL's;IADL's;Play;Leisure;Social Participation    Body Structure / Function / Physical Skills  ADL;Flexibility;ROM;UE functional use;FMC;Pain;IADL;Strength    Rehab Potential  Fair    Clinical Decision Making  Limited treatment options, no task modification necessary    Comorbidities Affecting Occupational  Performance:  May have comorbidities impacting occupational performance    Modification or Assistance to Complete Evaluation   No modification of tasks or assist necessary to complete eval    OT Frequency  Biweekly    OT Duration  4 weeks    OT Treatment/Interventions  Self-care/ADL training;Splinting;Patient/family education;Manual Therapy;Passive range of  motion;Contrast Bath;Paraffin;Therapeutic exercise    Plan  progress in HEP    OT Home Exercise Plan  see pt instructions       Patient will benefit from skilled therapeutic intervention in order to improve the following deficits and impairments:   Body Structure / Function / Physical Skills: ADL, Flexibility, ROM, UE functional use, FMC, Pain, IADL, Strength       Visit Diagnosis: Stiffness of left hand, not elsewhere classified  Stiffness of right hand, not elsewhere classified  Pain in left hand  Muscle weakness (generalized)    Problem List Patient Active Problem List   Diagnosis Date Noted  . Right anterior knee pain 05/01/2018  . 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 Swelling of right knee joint 10/01/2017  . Encounter for long-term (current) use of high-risk medication 02/28/2017  . Rheumatoid arthritis involving multiple sites (HCC) 02/28/2017  . Unintentional weight loss 02/28/2017  . Bilateral hand pain 02/14/2017  . Acquired hypothyroidism 01/21/2017  . Osteoarthritis of both knees 01/21/2017  . Iron deficiency anemia 11/30/2016  . Status post total replacement of right hip 08/31/2015  . Trochanteric bursitis of left hip 06/13/2015  . Hyperlipidemia, unspecified 05/03/2015    Oletta Cohn OTR/L,CLT 11/10/2019, 2:39 PM  [facility] [address] [place], [address] Phone: [phone]   Fax:  [phone]  Name: [name] MRN: [id number] Date of Birth: Sep 26, 1940

## 2019-11-11 ENCOUNTER — Ambulatory Visit: Payer: PPO | Attending: Internal Medicine

## 2019-11-11 DIAGNOSIS — Z23 Encounter for immunization: Secondary | ICD-10-CM

## 2019-11-11 NOTE — Progress Notes (Signed)
   Covid-19 Vaccination Clinic  Name:  [name]    MRN: [id number] DOB: Jan 06, 1941  11/11/2019  [name] was observed post Covid-19 immunization for 15 minutes without incident. She was provided with Vaccine Information Sheet and instruction to access the V-Safe system.   [name] was instructed to call 911 with any severe reactions post vaccine: Marland Kitchen Difficulty breathing  . Swelling of face and throat  . A fast heartbeat  . A bad rash all over body  . Dizziness and weakness   Immunizations Administered    Name Date Dose VIS Date Route   Pfizer COVID-19 Vaccine 11/11/2019  2:40 PM 0.3 mL 07/31/2019 Intramuscular   Manufacturer: [employer], [employer]   Lot: [id number]   NDC: [address]-7

## 2019-11-17 DIAGNOSIS — M0579 Rheumatoid arthritis with rheumatoid factor of multiple sites without organ or systems involvement: Secondary | ICD-10-CM | POA: Diagnosis not present

## 2019-11-24 ENCOUNTER — Other Ambulatory Visit: Payer: Self-pay

## 2019-11-24 ENCOUNTER — Ambulatory Visit: Payer: PPO | Attending: Internal Medicine | Admitting: Occupational Therapy

## 2019-11-24 DIAGNOSIS — M25642 Stiffness of left hand, not elsewhere classified: Secondary | ICD-10-CM | POA: Diagnosis not present

## 2019-11-24 DIAGNOSIS — M25641 Stiffness of right hand, not elsewhere classified: Secondary | ICD-10-CM | POA: Diagnosis not present

## 2019-11-24 DIAGNOSIS — M6281 Muscle weakness (generalized): Secondary | ICD-10-CM | POA: Diagnosis not present

## 2019-11-24 DIAGNOSIS — M79642 Pain in left hand: Secondary | ICD-10-CM | POA: Insufficient documentation

## 2019-11-24 NOTE — Therapy (Signed)
[place] [facility] [address] [place], [address] Phone: [phone]   Fax:  [phone]  Occupational Therapy Treatment  Patient Details  Name: [name] MRN: [id number] Date of Birth: July 10, 1941 Referring Provider (OT): Renard Matter   Encounter Date: 11/24/2019  OT End of Session - 11/24/19 1010    Visit Number  3    Number of Visits  3    Date for OT Re-Evaluation  12/08/19    OT Start Time  0945    OT Stop Time  1020    OT Time Calculation (min)  35 min    Activity Tolerance  Patient tolerated treatment well    Behavior During Therapy  [facility] for tasks assessed/performed       Past Medical History:  Diagnosis Date  . Anemia   . Arthritis   . Hypertension     Past Surgical History:  Procedure Laterality Date  . APPENDECTOMY    . JOINT REPLACEMENT Right 2014   THR  . TONSILLECTOMY    . TOTAL HIP ARTHROPLASTY Left 08/31/2015   Procedure: TOTAL HIP ARTHROPLASTY;  Surgeon: Donato Heinz, MD;  Location: [facility] ORS;  Service: Orthopedics;  Laterality: Left;    There were no vitals filed for this visit.  Subjective Assessment - 11/24/19 1006    Subjective   I had my 2nd infusion - doing good- no really pain in my hand - L hand doing better - that clicking is gone out of my L hand - my R hand same - trying to keep it open some during day - I can still pump gas with ther R hand    Pertinent History  Pt has history of RA - pt on prednisone 10 mg, plaquenil,, metratrexate and starting remicade March 15h - the last year harder time with her hands - L hand pai and stiffness and R hand cannot open fingers    Patient Stated Goals  I want to be able to make fist with my L hand and pain better- so I can do things for myself -    Currently in Pain?  No/denies         [facility] OT Assessment - 11/24/19 0001      Strength   Right Hand Grip (lbs)  20    Right Hand Lateral Pinch  6 lbs    Right Hand 3 Point Pinch  --    unable    Left Hand Grip (lbs)  17    Left Hand Lateral Pinch  7 lbs    Left Hand 3 Point Pinch  --   unable     Left Hand AROM   L Index  MCP 0-90  85 Degrees    L Index PIP 0-100  95 Degrees    L Long  MCP 0-90  90 Degrees    L Long PIP 0-100  80 Degrees    L Ring  MCP 0-90  100 Degrees    L Ring PIP 0-100  65 Degrees    L Little  MCP 0-90  100 Degrees    L Little PIP 0-100  100 Degrees      Pt made progress from [facility] in L hand AROM , and bilateral grip and lat grip - see flow sheet  Still show decrease PIP flexion of L 3rd and 4th - but no triggering with composite flexion of L hand digits    PROM review and add to  HEP for 3rd and 4th digit on L hand  Pt demo understanding  AAROM to L hand for PIP flexion and blocked intrinsic fist reinforce - need max A for review of HEP - and hand out provided again  And place and hold composite fist - extention digits stretch for R hand - to work it into her word search schedule  And then Add Radial digits AROM to L hand - sliding  Pt to cont with  L 4th digit MC block splint - pt to use during day with composite fisting act           OT Treatments/Exercises (OP) - 11/24/19 0001      RUE Paraffin   Number Minutes Paraffin  8 Minutes    RUE Paraffin Location  Hand    Comments  prior to PROM for 3rd and 4th PIP and composite flexion              OT Education - 11/24/19 1010    Education Details  HEP and progress    Person(s) Educated  Patient    Methods  Explanation;Demonstration;Tactile cues;Verbal cues;Handout    Comprehension  Verbal cues required;Returned demonstration;Verbalized understanding          OT Long Term Goals - 11/24/19 1030      OT LONG TERM GOAL #1   Title  Pt to be independent in HEP to prevent flexor contrature on R hand and increase flexion of digits in L hand without triggering    Baseline  no triggering in L hand , and some progress in ROM and grip /lat grip - ind in HEP    Status  Achieved       OT LONG TERM GOAL #2   Title  Pain decrease to less than  4/10 in L hand    Baseline  no pain with ROM or use reported    Status  Achieved            Plan - 11/24/19 1011    Clinical Impression Statement  Pt with diagnosis of RA in bilateral hands - pt had her 2nd Plaquenil infusion - show decrease pain , decrease triggering in L hand , and increase grip and lat grip - R hand show subluxation of extensor tendons over MC's at 3rd thru 5th - pt aware and demo understanding in HEP to cont at home    OT Occupational Profile and History  Problem Focused Assessment - Including review of records relating to presenting problem    Occupational performance deficits (Please refer to evaluation for details):  ADL's;IADL's;Play;Leisure;Social Participation    Body Structure / Function / Physical Skills  ADL;Flexibility;ROM;UE functional use;FMC;Pain;IADL;Strength    Rehab Potential  Fair    Clinical Decision Making  Limited treatment options, no task modification necessary    Comorbidities Affecting Occupational Performance:  May have comorbidities impacting occupational performance    Modification or Assistance to Complete Evaluation   No modification of tasks or assist necessary to complete eval    Plan  discharge instructions    OT Home Exercise Plan  see pt instructions    Consulted and Agree with Plan of Care  Patient       Patient will benefit from skilled therapeutic intervention in order to improve the following deficits and impairments:   Body Structure / Function / Physical Skills: ADL, Flexibility, ROM, UE functional use, FMC, Pain, IADL, Strength       Visit Diagnosis: Stiffness of left hand, not  elsewhere classified  Stiffness of right hand, not elsewhere classified  Pain in left hand  Muscle weakness (generalized)    Problem List Patient Active Problem List   Diagnosis Date Noted  . Right anterior knee pain 05/01/2018  . Primary osteoarthritis of [name] hips  01/31/2018  . Swelling of right knee joint 10/01/2017  . Encounter for long-term (current) use of high-risk medication 02/28/2017  . Rheumatoid arthritis involving multiple sites ([place]) 02/28/2017  . Unintentional weight loss 02/28/2017  . Bilateral hand pain 02/14/2017  . Acquired hypothyroidism 01/21/2017  . Osteoarthritis of [name] knees 01/21/2017  . Iron deficiency anemia 11/30/2016  . Status post total replacement of right hip 08/31/2015  . Trochanteric bursitis of left hip 06/13/2015  . Hyperlipidemia, unspecified 05/03/2015    Rosalyn Gess OTR/L,CLT 11/24/2019, 12:15 PM  [place] [facility] [address], [place], [address] Phone: [phone]   Fax:  [phone]  Name: [name] MRN: [id number] Date of Birth: [birth year]

## 2019-11-24 NOTE — Patient Instructions (Signed)
Same but focus on composite flexion PROM to L 3rd and 4th , and blocked intrinsic fist And RD of digits on L hand  And R hand - passive stretch and keeping hand open at times during day

## 2019-12-15 DIAGNOSIS — M0579 Rheumatoid arthritis with rheumatoid factor of multiple sites without organ or systems involvement: Secondary | ICD-10-CM | POA: Diagnosis not present

## 2019-12-24 ENCOUNTER — Encounter: Payer: Self-pay | Admitting: Podiatry

## 2019-12-24 ENCOUNTER — Other Ambulatory Visit: Payer: Self-pay

## 2019-12-24 ENCOUNTER — Ambulatory Visit (INDEPENDENT_AMBULATORY_CARE_PROVIDER_SITE_OTHER): Payer: PPO | Admitting: Podiatry

## 2019-12-24 VITALS — Temp 97.6°F

## 2019-12-24 DIAGNOSIS — M069 Rheumatoid arthritis, unspecified: Secondary | ICD-10-CM | POA: Diagnosis not present

## 2019-12-24 DIAGNOSIS — M79676 Pain in unspecified toe(s): Secondary | ICD-10-CM

## 2019-12-24 DIAGNOSIS — B351 Tinea unguium: Secondary | ICD-10-CM

## 2019-12-24 NOTE — Progress Notes (Signed)
This patient returns to the office for evaluation and treatment of long thick painful nails .  This patient is unable to trim her own nails since the patient cannot reach the feet.  Patient says the nails are painful walking and wearing his shoes. Patient has severe RA which has led to bunions and deformed  contracted toes. She returns for preventive foot care services.  General Appearance  Alert, conversant and in no acute stress.  Vascular  Dorsalis pedis and posterior tibial  pulses are palpable  bilaterally.  Capillary return is within normal limits  bilaterally. Temperature is within normal limits  bilaterally.  Neurologic  Senn-Weinstein monofilament wire test within normal limits  bilaterally. Muscle power within normal limits bilaterally.  Nails Thick disfigured discolored nails with subungual debris  from hallux to fifth toes bilaterally. No evidence of bacterial infection or drainage bilaterally.  Orthopedic  No limitations of motion  feet .  No crepitus or effusions noted. 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  Hammer/contracted toes  B/l  Acquired pes planus    Skin  normotropic skin with no porokeratosis noted bilaterally.  No signs of infections or ulcers noted.     Onychomycosis  Pain in toes right foot  Pain in toes left foot  Debridement  of nails  1-5  B/L with a nail nipper.  Nails were then filed using a dremel tool with no incidents.  .  RTC 9 weeks.   Cutler Sunday DPM  

## 2020-01-01 DIAGNOSIS — Z0000 Encounter for general adult medical examination without abnormal findings: Secondary | ICD-10-CM | POA: Diagnosis not present

## 2020-01-01 DIAGNOSIS — M0579 Rheumatoid arthritis with rheumatoid factor of multiple sites without organ or systems involvement: Secondary | ICD-10-CM | POA: Diagnosis not present

## 2020-01-01 DIAGNOSIS — D509 Iron deficiency anemia, unspecified: Secondary | ICD-10-CM | POA: Diagnosis not present

## 2020-01-01 DIAGNOSIS — E785 Hyperlipidemia, unspecified: Secondary | ICD-10-CM | POA: Diagnosis not present

## 2020-01-01 DIAGNOSIS — Z1331 Encounter for screening for depression: Secondary | ICD-10-CM | POA: Diagnosis not present

## 2020-01-01 DIAGNOSIS — M8580 Other specified disorders of bone density and structure, unspecified site: Secondary | ICD-10-CM | POA: Diagnosis not present

## 2020-01-01 DIAGNOSIS — E039 Hypothyroidism, unspecified: Secondary | ICD-10-CM | POA: Diagnosis not present

## 2020-01-07 DIAGNOSIS — M810 Age-related osteoporosis without current pathological fracture: Secondary | ICD-10-CM | POA: Diagnosis not present

## 2020-01-12 DIAGNOSIS — M0579 Rheumatoid arthritis with rheumatoid factor of multiple sites without organ or systems involvement: Secondary | ICD-10-CM | POA: Diagnosis not present

## 2020-01-12 DIAGNOSIS — M160 Bilateral primary osteoarthritis of hip: Secondary | ICD-10-CM | POA: Diagnosis not present

## 2020-01-12 DIAGNOSIS — M1711 Unilateral primary osteoarthritis, right knee: Secondary | ICD-10-CM | POA: Diagnosis not present

## 2020-01-12 DIAGNOSIS — Z79899 Other long term (current) drug therapy: Secondary | ICD-10-CM | POA: Diagnosis not present

## 2020-02-09 DIAGNOSIS — M0579 Rheumatoid arthritis with rheumatoid factor of multiple sites without organ or systems involvement: Secondary | ICD-10-CM | POA: Diagnosis not present

## 2020-02-23 ENCOUNTER — Inpatient Hospital Stay: Payer: PPO | Attending: Oncology

## 2020-02-23 ENCOUNTER — Other Ambulatory Visit: Payer: Self-pay

## 2020-02-23 DIAGNOSIS — D509 Iron deficiency anemia, unspecified: Secondary | ICD-10-CM | POA: Diagnosis not present

## 2020-02-23 LAB — CBC WITH DIFFERENTIAL/PLATELET
Abs Immature Granulocytes: 0.04 10*3/uL (ref 0.00–0.07)
Basophils Absolute: 0 10*3/uL (ref 0.0–0.1)
Basophils Relative: 0 %
Eosinophils Absolute: 0 10*3/uL (ref 0.0–0.5)
Eosinophils Relative: 0 %
HCT: 41.8 % (ref 36.0–46.0)
Hemoglobin: 14.1 g/dL (ref 12.0–15.0)
Immature Granulocytes: 1 %
Lymphocytes Relative: 7 %
Lymphs Abs: 0.6 10*3/uL — ABNORMAL LOW (ref 0.7–4.0)
MCH: 32 pg (ref 26.0–34.0)
MCHC: 33.7 g/dL (ref 30.0–36.0)
MCV: 95 fL (ref 80.0–100.0)
Monocytes Absolute: 0.4 10*3/uL (ref 0.1–1.0)
Monocytes Relative: 5 %
Neutro Abs: 6.9 10*3/uL (ref 1.7–7.7)
Neutrophils Relative %: 87 %
Platelets: 184 10*3/uL (ref 150–400)
RBC: 4.4 MIL/uL (ref 3.87–5.11)
RDW: 14.1 % (ref 11.5–15.5)
WBC: 7.9 10*3/uL (ref 4.0–10.5)
nRBC: 0 % (ref 0.0–0.2)

## 2020-02-23 LAB — IRON AND TIBC
Iron: 39 ug/dL (ref 28–170)
Saturation Ratios: 20 % (ref 10.4–31.8)
TIBC: 200 ug/dL — ABNORMAL LOW (ref 250–450)
UIBC: 161 ug/dL

## 2020-02-23 LAB — FERRITIN: Ferritin: 599 ng/mL — ABNORMAL HIGH (ref 11–307)

## 2020-02-23 LAB — VITAMIN B12: Vitamin B-12: 433 pg/mL (ref 180–914)

## 2020-02-29 ENCOUNTER — Ambulatory Visit (INDEPENDENT_AMBULATORY_CARE_PROVIDER_SITE_OTHER): Payer: PPO | Admitting: Podiatry

## 2020-02-29 ENCOUNTER — Encounter: Payer: Self-pay | Admitting: Podiatry

## 2020-02-29 ENCOUNTER — Other Ambulatory Visit: Payer: Self-pay

## 2020-02-29 DIAGNOSIS — B351 Tinea unguium: Secondary | ICD-10-CM

## 2020-02-29 DIAGNOSIS — M069 Rheumatoid arthritis, unspecified: Secondary | ICD-10-CM | POA: Diagnosis not present

## 2020-02-29 DIAGNOSIS — M79676 Pain in unspecified toe(s): Secondary | ICD-10-CM

## 2020-02-29 NOTE — Progress Notes (Signed)
This patient returns to the office for evaluation and treatment of long thick painful nails .  This patient is unable to trim her own nails since the patient cannot reach the feet.  Patient says the nails are painful walking and wearing his shoes. Patient has severe RA which has led to bunions and deformed  contracted toes. She returns for preventive foot care services.  General Appearance  Alert, conversant and in no acute stress.  Vascular  Dorsalis pedis and posterior tibial  pulses are palpable  bilaterally.  Capillary return is within normal limits  bilaterally. Temperature is within normal limits  bilaterally.  Neurologic  Senn-Weinstein monofilament wire test within normal limits  bilaterally. Muscle power within normal limits bilaterally.  Nails Thick disfigured discolored nails with subungual debris  from hallux to fifth toes bilaterally. No evidence of bacterial infection or drainage bilaterally.  Orthopedic  No limitations of motion  feet .  No crepitus or effusions noted. 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  Hammer/contracted toes  B/l  Acquired pes planus    Skin  normotropic skin with no porokeratosis noted bilaterally.  No signs of infections or ulcers noted.     Onychomycosis  Pain in toes right foot  Pain in toes left foot  Debridement  of nails  1-5  B/L with a nail nipper.  Nails were then filed using a dremel tool with no incidents.  .  RTC 9 weeks.   Vernell Townley DPM  

## 2020-04-07 DIAGNOSIS — M0579 Rheumatoid arthritis with rheumatoid factor of multiple sites without organ or systems involvement: Secondary | ICD-10-CM | POA: Diagnosis not present

## 2020-04-14 DIAGNOSIS — Z79899 Other long term (current) drug therapy: Secondary | ICD-10-CM | POA: Diagnosis not present

## 2020-04-14 DIAGNOSIS — M8949 Other hypertrophic osteoarthropathy, multiple sites: Secondary | ICD-10-CM | POA: Diagnosis not present

## 2020-04-14 DIAGNOSIS — M0579 Rheumatoid arthritis with rheumatoid factor of multiple sites without organ or systems involvement: Secondary | ICD-10-CM | POA: Diagnosis not present

## 2020-05-02 ENCOUNTER — Encounter: Payer: Self-pay | Admitting: Podiatry

## 2020-05-02 ENCOUNTER — Ambulatory Visit (INDEPENDENT_AMBULATORY_CARE_PROVIDER_SITE_OTHER): Payer: PPO | Admitting: Podiatry

## 2020-05-02 ENCOUNTER — Other Ambulatory Visit: Payer: Self-pay

## 2020-05-02 DIAGNOSIS — M069 Rheumatoid arthritis, unspecified: Secondary | ICD-10-CM | POA: Diagnosis not present

## 2020-05-02 DIAGNOSIS — B351 Tinea unguium: Secondary | ICD-10-CM | POA: Diagnosis not present

## 2020-05-02 DIAGNOSIS — M79676 Pain in unspecified toe(s): Secondary | ICD-10-CM

## 2020-05-02 NOTE — Progress Notes (Signed)
This patient returns to the office for evaluation and treatment of long thick painful nails .  This patient is unable to trim her own nails since the patient cannot reach the feet.  Patient says the nails are painful walking and wearing his shoes. Patient has severe RA which has led to bunions and deformed  contracted toes. She returns for preventive foot care services.  General Appearance  Alert, conversant and in no acute stress.  Vascular  Dorsalis pedis and posterior tibial  pulses are palpable  bilaterally.  Capillary return is within normal limits  bilaterally. Temperature is within normal limits  bilaterally.  Neurologic  Senn-Weinstein monofilament wire test within normal limits  bilaterally. Muscle power within normal limits bilaterally.  Nails Thick disfigured discolored nails with subungual debris  from hallux to fifth toes bilaterally. No evidence of bacterial infection or drainage bilaterally.  Orthopedic  No limitations of motion  feet .  No crepitus or effusions noted. 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  Hammer/contracted toes  B/l  Acquired pes planus    Skin  normotropic skin with no porokeratosis noted bilaterally.  No signs of infections or ulcers noted.     Onychomycosis  Pain in toes right foot  Pain in toes left foot  Debridement  of nails  1-5  B/L with a nail nipper.  Nails were then filed using a dremel tool with no incidents.  .  RTC 9 weeks.   Illona Bulman DPM  

## 2020-05-31 DIAGNOSIS — M0579 Rheumatoid arthritis with rheumatoid factor of multiple sites without organ or systems involvement: Secondary | ICD-10-CM | POA: Diagnosis not present

## 2020-06-14 ENCOUNTER — Telehealth: Payer: Self-pay | Admitting: Oncology

## 2020-06-14 NOTE — Telephone Encounter (Signed)
.  06/14/2020  Left VM informing pt that their appts on 06/24/20 have been moved to 06/20/20 (same time different day), per Dr. Smith Robert. Left call back number and informed them to reach out if they had any questions or concerns  SRW

## 2020-06-20 ENCOUNTER — Inpatient Hospital Stay: Payer: PPO | Attending: Oncology

## 2020-06-20 ENCOUNTER — Other Ambulatory Visit: Payer: Self-pay

## 2020-06-20 ENCOUNTER — Inpatient Hospital Stay (HOSPITAL_BASED_OUTPATIENT_CLINIC_OR_DEPARTMENT_OTHER): Payer: PPO | Admitting: Oncology

## 2020-06-20 ENCOUNTER — Encounter: Payer: Self-pay | Admitting: Oncology

## 2020-06-20 VITALS — BP 167/75 | HR 80 | Temp 97.8°F | Resp 20 | Wt 117.7 lb

## 2020-06-20 DIAGNOSIS — E039 Hypothyroidism, unspecified: Secondary | ICD-10-CM | POA: Insufficient documentation

## 2020-06-20 DIAGNOSIS — E785 Hyperlipidemia, unspecified: Secondary | ICD-10-CM | POA: Diagnosis not present

## 2020-06-20 DIAGNOSIS — Z800 Family history of malignant neoplasm of digestive organs: Secondary | ICD-10-CM | POA: Insufficient documentation

## 2020-06-20 DIAGNOSIS — M069 Rheumatoid arthritis, unspecified: Secondary | ICD-10-CM | POA: Insufficient documentation

## 2020-06-20 DIAGNOSIS — D638 Anemia in other chronic diseases classified elsewhere: Secondary | ICD-10-CM | POA: Insufficient documentation

## 2020-06-20 DIAGNOSIS — D509 Iron deficiency anemia, unspecified: Secondary | ICD-10-CM | POA: Diagnosis not present

## 2020-06-20 LAB — CBC WITH DIFFERENTIAL/PLATELET
Abs Immature Granulocytes: 0.03 10*3/uL (ref 0.00–0.07)
Basophils Absolute: 0 10*3/uL (ref 0.0–0.1)
Basophils Relative: 0 %
Eosinophils Absolute: 0 10*3/uL (ref 0.0–0.5)
Eosinophils Relative: 0 %
HCT: 44.3 % (ref 36.0–46.0)
Hemoglobin: 14.9 g/dL (ref 12.0–15.0)
Immature Granulocytes: 0 %
Lymphocytes Relative: 6 %
Lymphs Abs: 0.4 10*3/uL — ABNORMAL LOW (ref 0.7–4.0)
MCH: 32.4 pg (ref 26.0–34.0)
MCHC: 33.6 g/dL (ref 30.0–36.0)
MCV: 96.3 fL (ref 80.0–100.0)
Monocytes Absolute: 0.2 10*3/uL (ref 0.1–1.0)
Monocytes Relative: 2 %
Neutro Abs: 6.4 10*3/uL (ref 1.7–7.7)
Neutrophils Relative %: 92 %
Platelets: 210 10*3/uL (ref 150–400)
RBC: 4.6 MIL/uL (ref 3.87–5.11)
RDW: 14.5 % (ref 11.5–15.5)
WBC: 7 10*3/uL (ref 4.0–10.5)
nRBC: 0 % (ref 0.0–0.2)

## 2020-06-20 LAB — IRON AND TIBC
Iron: 61 ug/dL (ref 28–170)
Saturation Ratios: 28 % (ref 10.4–31.8)
TIBC: 216 ug/dL — ABNORMAL LOW (ref 250–450)
UIBC: 155 ug/dL

## 2020-06-20 LAB — FERRITIN: Ferritin: 705 ng/mL — ABNORMAL HIGH (ref 11–307)

## 2020-06-23 NOTE — Progress Notes (Signed)
   Hematology/Oncology Consult note [facility]  [phone] [phone]  Patient Care Team: Bronstein, David, MD as PCP - General (Family Medicine)   Name of the patient: [name]  [id number]  05/31/1941   Date of visit: 06/23/20  Diagnosis- anemia likely due to iron deficiency and hypothyroidismas well as anemia of chronic disease  Chief complaint/ Reason for visit-team follow-up of anemia  Heme/Onc history: patient is a 79-year-old female with a past medical history significant for hyperlipidemia, osteoporosis and arthritis. Recent blood work from 11/14/2016 showed B-12 level of 450. CBC showed white count of 8.3, H&H of 9.8/34 with an MCV of 77.4 and platelet count of 553. Her hemoglobin has been ranging around 9 05/26/2016 and a platelet count has been in the 500ssince then as well. BMP showed elevated blood sugar of 200 and mild hyponatremia. BUN and creatinine were within normal limits. Iron studies revealed decreased iron of less than 10, iron saturation of less than 4%, TIBC of 252. Urinalysis did not reveal any hematuria.  Patient lives alone and is independent of her ADLs but her mobility is limited because of her bilateral leg swelling. She takes Celebrex daily for arthritis. Denies any routine use of other NSAIDs. Denies any blood in her stno family history of colon cancerools.  Further blood work from 11/30/2016 was as follows: CBC showed hemoglobin of 10.4/32.2 with an MCV of 69.4 and a platelet count of 559. TSH was elevated at 11.7. Haptoglobin was elevated at 3 CN reticulocyte count was low at 0.9. H pylori stool antigen was negative. Celiac disease panel was within normal limits. Multiple myeloma panel revealed polyclonal gammopathy.  Patient was referred to GI but refused any endoscopy. She received 2 doses of ferahemewith improvement of her anemia in the past  Interval history-patient has chronic joint pain and  deformity from rheumatoid arthritis.  She is carrying on her ADLs without significant difficulty at this time.  She is on methotrexate and Plaquenil for the same.  ECOG PS- 2 Pain scale- 4   Review of systems- Review of Systems  Constitutional: Positive for malaise/fatigue. Negative for chills, fever and weight loss.  HENT: Negative for congestion, ear discharge and nosebleeds.   Eyes: Negative for blurred vision.  Respiratory: Negative for cough, hemoptysis, sputum production, shortness of breath and wheezing.   Cardiovascular: Negative for chest pain, palpitations, orthopnea and claudication.  Gastrointestinal: Negative for abdominal pain, blood in stool, constipation, diarrhea, heartburn, melena, nausea and vomiting.  Genitourinary: Negative for dysuria, flank pain, frequency, hematuria and urgency.  Musculoskeletal: Positive for joint pain. Negative for back pain and myalgias.  Skin: Negative for rash.  Neurological: Negative for dizziness, tingling, focal weakness, seizures, weakness and headaches.  Endo/Heme/Allergies: Does not bruise/bleed easily.  Psychiatric/Behavioral: Negative for depression and suicidal ideas. The patient does not have insomnia.       No Known Allergies   Past Medical History:  Diagnosis Date  . Anemia   . Arthritis   . Hypertension      Past Surgical History:  Procedure Laterality Date  . APPENDECTOMY    . JOINT REPLACEMENT Right 2014   THR  . TONSILLECTOMY    . TOTAL HIP ARTHROPLASTY Left 08/31/2015   Procedure: TOTAL HIP ARTHROPLASTY;  Surgeon: James P Hooten, MD;  Location: [facility] ORS;  Service: Orthopedics;  Laterality: Left;    Social History   Socioeconomic History  . Marital status: Widowed    Spouse name: Not on file  .   Number of children: Not on file  . Years of education: Not on file  . Highest education level: Not on file  Occupational History  . Not on file  Tobacco Use  . Smoking status: Never Smoker  . Smokeless tobacco:  Never Used  Vaping Use  . Vaping Use: Never used  Substance and Sexual Activity  . Alcohol use: No  . Drug use: No  . Sexual activity: Never  Other Topics Concern  . Not on file  Social History Narrative  . Not on file   Social Determinants of Health   Financial Resource Strain:   . Difficulty of Paying Living Expenses: Not on file  Food Insecurity:   . Worried About Running Out of Food in the Last Year: Not on file  . Ran Out of Food in the Last Year: Not on file  Transportation Needs:   . Lack of Transportation (Medical): Not on file  . Lack of Transportation (Non-Medical): Not on file  Physical Activity:   . Days of Exercise per Week: Not on file  . Minutes of Exercise per Session: Not on file  Stress:   . Feeling of Stress : Not on file  Social Connections:   . Frequency of Communication with Friends and Family: Not on file  . Frequency of Social Gatherings with Friends and Family: Not on file  . Attends Religious Services: Not on file  . Active Member of Clubs or Organizations: Not on file  . Attends Club or Organization Meetings: Not on file  . Marital Status: Not on file  Intimate Partner Violence:   . Fear of Current or Ex-Partner: Not on file  . Emotionally Abused: Not on file  . Physically Abused: Not on file  . Sexually Abused: Not on file    Family History  Problem Relation Age of Onset  . Pancreatic cancer Mother   . Rheum arthritis Father   . Congestive Heart Failure Father   . Pancreatic cancer Maternal Aunt   . Heart attack Maternal Grandfather   . Arthritis Paternal Grandmother   . Arthritis Paternal Grandfather      Current Outpatient Medications:  .  acetaminophen (TYLENOL) 500 MG tablet, Take 500 mg by mouth 2 (two) times daily. , Disp: , Rfl:  .  alendronate (FOSAMAX) 70 MG tablet, Take 70 mg by mouth once a week., Disp: , Rfl:  .  aspirin EC 81 MG tablet, Take 81 mg by mouth daily. , Disp: , Rfl:  .  cholecalciferol (VITAMIN D) 1000 units  tablet, Take 1,000 Units by mouth daily., Disp: , Rfl:  .  diclofenac sodium (VOLTAREN) 1 % GEL, Apply 2 g topically 3 (three) times daily as needed. , Disp: , Rfl:  .  etodolac (LODINE) 500 MG tablet, Take 500 mg by mouth daily as needed., Disp: , Rfl:  .  ferrous sulfate 324 (65 Fe) MG TBEC, Take 1 tablet by mouth daily. , Disp: , Rfl:  .  folic acid (FOLVITE) 1 MG tablet, Take 1 mg by mouth daily., Disp: , Rfl:  .  hydroxychloroquine (PLAQUENIL) 200 MG tablet, One tab daily for rheumatoid arthritis, 30 tabs, Disp: , Rfl:  .  levothyroxine (SYNTHROID) 75 MCG tablet, TAKE 1 TABLET(75 MCG) BY MOUTH EVERY DAY 30 TO 60 MINUTES BEFORE BREAKFAST ON AN EMPTY STOMACH AND WITH A GLASS OF WATER, Disp: , Rfl:  .  methotrexate (RHEUMATREX) 2.5 MG tablet, Take 7 tabs (17.5 mg ) weekly, 12 weeks , 84 tabs,   Disp: , Rfl:  .  predniSONE (DELTASONE) 10 MG tablet, Take one tab daily, 30 days, Disp: , Rfl:  .  vitamin B-12 (CYANOCOBALAMIN) 1000 MCG tablet, Take 1,000 mcg by mouth daily., Disp: , Rfl:   Current Facility-Administered Medications:  .  betamethasone acetate-betamethasone sodium phosphate (CELESTONE) injection 3 mg, 3 mg, Intramuscular, Once, Evans, Brent M, DPM .  betamethasone acetate-betamethasone sodium phosphate (CELESTONE) injection 3 mg, 3 mg, Intramuscular, Once, Evans, Brent M, DPM  Physical exam:  Vitals:   06/20/20 0934  BP: (!) 167/75  Pulse: 80  Resp: 20  Temp: 97.8 F (36.6 C)  SpO2: 100%  Weight: 117 lb 11.2 oz (53.4 kg)   Physical Exam Constitutional:      Comments: Thin female in no acute distress  Eyes:     Pupils: Pupils are equal, round, and reactive to light.  Cardiovascular:     Rate and Rhythm: Normal rate and regular rhythm.     Heart sounds: Normal heart sounds.  Pulmonary:     Effort: Pulmonary effort is normal.     Breath sounds: Normal breath sounds.  Abdominal:     General: Bowel sounds are normal.     Palpations: Abdomen is soft.  Musculoskeletal:      Comments: Chronic joint deformity involving both hands due to rheumatoid arthritis  Skin:    General: Skin is warm and dry.  Neurological:     Mental Status: She is alert and oriented to person, place, and time.      CMP Latest Ref Rng & Units 09/02/2015  Glucose 65 - 99 mg/dL 94  BUN 6 - 20 mg/dL 7  Creatinine 0.44 - 1.00 mg/dL 0.62  Sodium 135 - 145 mmol/L 135  Potassium 3.5 - 5.1 mmol/L 3.7  Chloride 101 - 111 mmol/L 101  CO2 22 - 32 mmol/L 27  Calcium 8.9 - 10.3 mg/dL 8.2(L)   CBC Latest Ref Rng & Units 06/20/2020  WBC 4.0 - 10.5 K/uL 7.0  Hemoglobin 12.0 - 15.0 g/dL 14.9  Hematocrit 36 - 46 % 44.3  Platelets 150 - 400 K/uL 210    No images are attached to the encounter.  No results found.   Assessment and plan- Patient is a 79 y.o. female with anemia of chronic disease and component of iron deficiency here for routine follow-up.  Patient's hemoglobin presently has improved to 14.9 from a prior value of 11.9 in March 2021.  Iron studies are within normal limits.  I will see her back in 1 year and also check interim labs in 6 months time with CBC ferritin and iron studies   Visit Diagnosis 1. Anemia of chronic disease   2. Iron deficiency anemia, unspecified iron deficiency anemia type      Dr.  , MD, MPH [facility] [phone] 06/23/2020 9:06 PM                

## 2020-06-24 ENCOUNTER — Ambulatory Visit: Payer: PPO | Admitting: Oncology

## 2020-06-24 ENCOUNTER — Other Ambulatory Visit: Payer: PPO

## 2020-07-04 ENCOUNTER — Encounter: Payer: Self-pay | Admitting: Podiatry

## 2020-07-04 ENCOUNTER — Other Ambulatory Visit: Payer: Self-pay

## 2020-07-04 ENCOUNTER — Ambulatory Visit (INDEPENDENT_AMBULATORY_CARE_PROVIDER_SITE_OTHER): Payer: PPO | Admitting: Podiatry

## 2020-07-04 DIAGNOSIS — M2010 Hallux valgus (acquired), unspecified foot: Secondary | ICD-10-CM

## 2020-07-04 DIAGNOSIS — B351 Tinea unguium: Secondary | ICD-10-CM

## 2020-07-04 DIAGNOSIS — M79676 Pain in unspecified toe(s): Secondary | ICD-10-CM | POA: Diagnosis not present

## 2020-07-04 DIAGNOSIS — M069 Rheumatoid arthritis, unspecified: Secondary | ICD-10-CM

## 2020-07-04 DIAGNOSIS — M2041 Other hammer toe(s) (acquired), right foot: Secondary | ICD-10-CM | POA: Insufficient documentation

## 2020-07-04 DIAGNOSIS — M2042 Other hammer toe(s) (acquired), left foot: Secondary | ICD-10-CM | POA: Diagnosis not present

## 2020-07-04 NOTE — Progress Notes (Addendum)
This patient returns to the office for evaluation and treatment of long thick painful nails .  This patient is unable to trim her own nails since the patient cannot reach the feet.  Patient says the nails are painful walking and wearing his shoes. Patient has severe RA which has led to bunions and deformed  contracted toes. She returns for preventive foot care services.  General Appearance  Alert, conversant and in no acute stress.  Vascular  Dorsalis pedis and posterior tibial  pulses are palpable  bilaterally.  Capillary return is within normal limits  bilaterally. Temperature is within normal limits  bilaterally.  Neurologic  Senn-Weinstein monofilament wire test within normal limits  bilaterally. Muscle power within normal limits bilaterally.  Nails Thick disfigured discolored nails with subungual debris  from hallux to fifth toes bilaterally. No evidence of bacterial infection or drainage bilaterally.  Orthopedic  No limitations of motion  feet .  No crepitus or effusions noted. 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  Hammer/contracted toes  B/l  Acquired pes planus    Skin  normotropic skin with no porokeratosis noted bilaterally.  No signs of infections or ulcers noted.     Onychomycosis  Pain in toes right foot  Pain in toes left foot  Debridement  of nails  1-5  B/L with a nail nipper.  Nails were then filed using a dremel tool with no incidents.  Padding applied to fourth toe left foot.  RTC 9 weeks.   Helane Gunther DPM

## 2020-07-08 DIAGNOSIS — E039 Hypothyroidism, unspecified: Secondary | ICD-10-CM | POA: Diagnosis not present

## 2020-07-08 DIAGNOSIS — E559 Vitamin D deficiency, unspecified: Secondary | ICD-10-CM | POA: Diagnosis not present

## 2020-07-08 DIAGNOSIS — M0579 Rheumatoid arthritis with rheumatoid factor of multiple sites without organ or systems involvement: Secondary | ICD-10-CM | POA: Diagnosis not present

## 2020-07-08 DIAGNOSIS — E785 Hyperlipidemia, unspecified: Secondary | ICD-10-CM | POA: Diagnosis not present

## 2020-07-08 DIAGNOSIS — M8580 Other specified disorders of bone density and structure, unspecified site: Secondary | ICD-10-CM | POA: Diagnosis not present

## 2020-07-26 DIAGNOSIS — M79652 Pain in left thigh: Secondary | ICD-10-CM | POA: Diagnosis not present

## 2020-07-26 DIAGNOSIS — Z79899 Other long term (current) drug therapy: Secondary | ICD-10-CM | POA: Diagnosis not present

## 2020-07-26 DIAGNOSIS — M0579 Rheumatoid arthritis with rheumatoid factor of multiple sites without organ or systems involvement: Secondary | ICD-10-CM | POA: Diagnosis not present

## 2020-07-26 DIAGNOSIS — M8580 Other specified disorders of bone density and structure, unspecified site: Secondary | ICD-10-CM | POA: Diagnosis not present

## 2020-07-26 DIAGNOSIS — M7989 Other specified soft tissue disorders: Secondary | ICD-10-CM | POA: Diagnosis not present

## 2020-07-26 DIAGNOSIS — M25552 Pain in left hip: Secondary | ICD-10-CM | POA: Diagnosis not present

## 2020-07-26 DIAGNOSIS — M160 Bilateral primary osteoarthritis of hip: Secondary | ICD-10-CM | POA: Diagnosis not present

## 2020-07-26 DIAGNOSIS — M898X5 Other specified disorders of bone, thigh: Secondary | ICD-10-CM | POA: Diagnosis not present

## 2020-07-26 DIAGNOSIS — E559 Vitamin D deficiency, unspecified: Secondary | ICD-10-CM | POA: Diagnosis not present

## 2020-07-26 DIAGNOSIS — E785 Hyperlipidemia, unspecified: Secondary | ICD-10-CM | POA: Diagnosis not present

## 2020-07-26 DIAGNOSIS — E039 Hypothyroidism, unspecified: Secondary | ICD-10-CM | POA: Diagnosis not present

## 2020-08-10 DIAGNOSIS — R319 Hematuria, unspecified: Secondary | ICD-10-CM | POA: Diagnosis not present

## 2020-09-05 ENCOUNTER — Ambulatory Visit: Payer: PPO | Admitting: Podiatry

## 2020-09-12 ENCOUNTER — Ambulatory Visit: Payer: PPO | Admitting: Podiatry

## 2020-09-20 DIAGNOSIS — M0579 Rheumatoid arthritis with rheumatoid factor of multiple sites without organ or systems involvement: Secondary | ICD-10-CM | POA: Diagnosis not present

## 2020-09-22 ENCOUNTER — Other Ambulatory Visit: Payer: Self-pay

## 2020-09-22 ENCOUNTER — Ambulatory Visit (INDEPENDENT_AMBULATORY_CARE_PROVIDER_SITE_OTHER): Payer: PPO | Admitting: Podiatry

## 2020-09-22 ENCOUNTER — Encounter: Payer: Self-pay | Admitting: Podiatry

## 2020-09-22 DIAGNOSIS — M79676 Pain in unspecified toe(s): Secondary | ICD-10-CM | POA: Diagnosis not present

## 2020-09-22 DIAGNOSIS — M2010 Hallux valgus (acquired), unspecified foot: Secondary | ICD-10-CM | POA: Diagnosis not present

## 2020-09-22 DIAGNOSIS — M069 Rheumatoid arthritis, unspecified: Secondary | ICD-10-CM

## 2020-09-22 DIAGNOSIS — M2041 Other hammer toe(s) (acquired), right foot: Secondary | ICD-10-CM | POA: Diagnosis not present

## 2020-09-22 DIAGNOSIS — B351 Tinea unguium: Secondary | ICD-10-CM | POA: Diagnosis not present

## 2020-09-22 DIAGNOSIS — M2042 Other hammer toe(s) (acquired), left foot: Secondary | ICD-10-CM

## 2020-09-22 NOTE — Progress Notes (Signed)
This patient returns to the office for evaluation and treatment of long thick painful nails .  This patient is unable to trim her own nails since the patient cannot reach the feet.  Patient says the nails are painful walking and wearing his shoes. Patient has severe RA which has led to bunions and deformed  contracted toes. She returns for preventive foot care services.  General Appearance  Alert, conversant and in no acute stress.  Vascular  Dorsalis pedis and posterior tibial  pulses are palpable  bilaterally.  Capillary return is within normal limits  bilaterally. Temperature is within normal limits  bilaterally.  Neurologic  Senn-Weinstein monofilament wire test within normal limits  bilaterally. Muscle power within normal limits bilaterally.  Nails Thick disfigured discolored nails with subungual debris  from hallux to fifth toes bilaterally. No evidence of bacterial infection or drainage bilaterally.  Orthopedic  No limitations of motion  feet .  No crepitus or effusions noted. 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  Hammer/contracted toes  B/l  Acquired pes planus    Skin  normotropic skin with no porokeratosis noted bilaterally.  No signs of infections or ulcers noted.     Onychomycosis  Pain in toes right foot  Pain in toes left foot  Debridement  of nails  1-5  B/L with a nail nipper.  Nails were then filed using a dremel tool with no incidents.  .  RTC 9 weeks.   Helane Gunther DPM

## 2020-11-15 DIAGNOSIS — N390 Urinary tract infection, site not specified: Secondary | ICD-10-CM | POA: Diagnosis not present

## 2020-11-15 DIAGNOSIS — M0579 Rheumatoid arthritis with rheumatoid factor of multiple sites without organ or systems involvement: Secondary | ICD-10-CM | POA: Diagnosis not present

## 2020-11-24 ENCOUNTER — Other Ambulatory Visit: Payer: Self-pay

## 2020-11-24 ENCOUNTER — Ambulatory Visit (INDEPENDENT_AMBULATORY_CARE_PROVIDER_SITE_OTHER): Payer: PPO | Admitting: Podiatry

## 2020-11-24 ENCOUNTER — Encounter: Payer: Self-pay | Admitting: Podiatry

## 2020-11-24 DIAGNOSIS — M2042 Other hammer toe(s) (acquired), left foot: Secondary | ICD-10-CM

## 2020-11-24 DIAGNOSIS — M069 Rheumatoid arthritis, unspecified: Secondary | ICD-10-CM

## 2020-11-24 DIAGNOSIS — M2010 Hallux valgus (acquired), unspecified foot: Secondary | ICD-10-CM | POA: Diagnosis not present

## 2020-11-24 DIAGNOSIS — B351 Tinea unguium: Secondary | ICD-10-CM

## 2020-11-24 DIAGNOSIS — M2041 Other hammer toe(s) (acquired), right foot: Secondary | ICD-10-CM

## 2020-11-24 DIAGNOSIS — M79676 Pain in unspecified toe(s): Secondary | ICD-10-CM | POA: Diagnosis not present

## 2020-11-24 NOTE — Progress Notes (Signed)
This patient returns to the office for evaluation and treatment of long thick painful nails .  This patient is unable to trim her own nails since the patient cannot reach the feet.  Patient says the nails are painful walking and wearing his shoes. Patient has severe RA which has led to bunions and deformed  contracted toes. She returns for preventive foot care services.  General Appearance  Alert, conversant and in no acute stress.  Vascular  Dorsalis pedis and posterior tibial  pulses are palpable  bilaterally.  Capillary return is within normal limits  bilaterally. Temperature is within normal limits  bilaterally.  Neurologic  Senn-Weinstein monofilament wire test within normal limits  bilaterally. Muscle power within normal limits bilaterally.  Nails Thick disfigured discolored nails with subungual debris  from hallux to fifth toes bilaterally. No evidence of bacterial infection or drainage bilaterally.  Orthopedic  No limitations of motion  feet .  No crepitus or effusions noted. 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  Hammer/contracted toes  B/l  Acquired pes planus    Skin  normotropic skin with no porokeratosis noted bilaterally.  No signs of infections or ulcers noted.     Onychomycosis  Pain in toes right foot  Pain in toes left foot  Debridement  of nails  1-5  B/L with a nail nipper.  Nails were then filed using a dremel tool with no incidents.  .  RTC 9 weeks.   Helane Gunther DPM

## 2020-12-19 ENCOUNTER — Inpatient Hospital Stay: Payer: PPO | Attending: Oncology

## 2020-12-19 DIAGNOSIS — D509 Iron deficiency anemia, unspecified: Secondary | ICD-10-CM | POA: Diagnosis not present

## 2020-12-19 DIAGNOSIS — D638 Anemia in other chronic diseases classified elsewhere: Secondary | ICD-10-CM

## 2020-12-19 LAB — IRON AND TIBC
Iron: 68 ug/dL (ref 28–170)
Saturation Ratios: 35 % — ABNORMAL HIGH (ref 10.4–31.8)
TIBC: 196 ug/dL — ABNORMAL LOW (ref 250–450)
UIBC: 128 ug/dL

## 2020-12-19 LAB — CBC WITH DIFFERENTIAL/PLATELET
Abs Immature Granulocytes: 0.01 10*3/uL (ref 0.00–0.07)
Basophils Absolute: 0 10*3/uL (ref 0.0–0.1)
Basophils Relative: 0 %
Eosinophils Absolute: 0.2 10*3/uL (ref 0.0–0.5)
Eosinophils Relative: 5 %
HCT: 40.1 % (ref 36.0–46.0)
Hemoglobin: 13.4 g/dL (ref 12.0–15.0)
Immature Granulocytes: 0 %
Lymphocytes Relative: 19 %
Lymphs Abs: 0.7 10*3/uL (ref 0.7–4.0)
MCH: 31.3 pg (ref 26.0–34.0)
MCHC: 33.4 g/dL (ref 30.0–36.0)
MCV: 93.7 fL (ref 80.0–100.0)
Monocytes Absolute: 0.4 10*3/uL (ref 0.1–1.0)
Monocytes Relative: 11 %
Neutro Abs: 2.5 10*3/uL (ref 1.7–7.7)
Neutrophils Relative %: 65 %
Platelets: 169 10*3/uL (ref 150–400)
RBC: 4.28 MIL/uL (ref 3.87–5.11)
RDW: 14.5 % (ref 11.5–15.5)
WBC: 3.9 10*3/uL — ABNORMAL LOW (ref 4.0–10.5)
nRBC: 0 % (ref 0.0–0.2)

## 2020-12-19 LAB — FERRITIN: Ferritin: 545 ng/mL — ABNORMAL HIGH (ref 11–307)

## 2021-01-10 DIAGNOSIS — M0579 Rheumatoid arthritis with rheumatoid factor of multiple sites without organ or systems involvement: Secondary | ICD-10-CM | POA: Diagnosis not present

## 2021-01-13 DIAGNOSIS — M79641 Pain in right hand: Secondary | ICD-10-CM | POA: Diagnosis not present

## 2021-01-13 DIAGNOSIS — Z0000 Encounter for general adult medical examination without abnormal findings: Secondary | ICD-10-CM | POA: Diagnosis not present

## 2021-01-13 DIAGNOSIS — M810 Age-related osteoporosis without current pathological fracture: Secondary | ICD-10-CM | POA: Diagnosis not present

## 2021-01-13 DIAGNOSIS — Z1331 Encounter for screening for depression: Secondary | ICD-10-CM | POA: Diagnosis not present

## 2021-01-13 DIAGNOSIS — M79642 Pain in left hand: Secondary | ICD-10-CM | POA: Diagnosis not present

## 2021-01-13 DIAGNOSIS — D509 Iron deficiency anemia, unspecified: Secondary | ICD-10-CM | POA: Diagnosis not present

## 2021-01-13 DIAGNOSIS — E039 Hypothyroidism, unspecified: Secondary | ICD-10-CM | POA: Diagnosis not present

## 2021-01-13 DIAGNOSIS — M069 Rheumatoid arthritis, unspecified: Secondary | ICD-10-CM | POA: Diagnosis not present

## 2021-01-19 DIAGNOSIS — M0579 Rheumatoid arthritis with rheumatoid factor of multiple sites without organ or systems involvement: Secondary | ICD-10-CM | POA: Diagnosis not present

## 2021-01-19 DIAGNOSIS — Z79899 Other long term (current) drug therapy: Secondary | ICD-10-CM | POA: Diagnosis not present

## 2021-01-19 DIAGNOSIS — M159 Polyosteoarthritis, unspecified: Secondary | ICD-10-CM | POA: Diagnosis not present

## 2021-01-26 ENCOUNTER — Other Ambulatory Visit: Payer: Self-pay

## 2021-01-26 ENCOUNTER — Encounter: Payer: Self-pay | Admitting: Podiatry

## 2021-01-26 ENCOUNTER — Ambulatory Visit (INDEPENDENT_AMBULATORY_CARE_PROVIDER_SITE_OTHER): Payer: PPO | Admitting: Podiatry

## 2021-01-26 DIAGNOSIS — M79676 Pain in unspecified toe(s): Secondary | ICD-10-CM | POA: Diagnosis not present

## 2021-01-26 DIAGNOSIS — M2010 Hallux valgus (acquired), unspecified foot: Secondary | ICD-10-CM

## 2021-01-26 DIAGNOSIS — B351 Tinea unguium: Secondary | ICD-10-CM | POA: Diagnosis not present

## 2021-01-26 DIAGNOSIS — M069 Rheumatoid arthritis, unspecified: Secondary | ICD-10-CM | POA: Diagnosis not present

## 2021-01-26 DIAGNOSIS — M2041 Other hammer toe(s) (acquired), right foot: Secondary | ICD-10-CM

## 2021-01-26 DIAGNOSIS — M2042 Other hammer toe(s) (acquired), left foot: Secondary | ICD-10-CM | POA: Diagnosis not present

## 2021-01-26 NOTE — Progress Notes (Signed)
This patient returns to the office for evaluation and treatment of long thick painful nails .  This patient is unable to trim her own nails since the patient cannot reach the feet.  Patient says the nails are painful walking and wearing his shoes. Patient has severe RA which has led to bunions and deformed  contracted toes. She returns for preventive foot care services.  General Appearance  Alert, conversant and in no acute stress.  Vascular  Dorsalis pedis and posterior tibial  pulses are palpable  bilaterally.  Capillary return is within normal limits  bilaterally. Temperature is within normal limits  bilaterally.  Neurologic  Senn-Weinstein monofilament wire test within normal limits  bilaterally. Muscle power within normal limits bilaterally.  Nails Thick disfigured discolored nails with subungual debris  from hallux to fifth toes bilaterally. No evidence of bacterial infection or drainage bilaterally.  Orthopedic  No limitations of motion  feet .  No crepitus or effusions noted. 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  Hammer/contracted toes  B/l  Acquired pes planus    Skin  normotropic skin with no porokeratosis noted bilaterally.  No signs of infections or ulcers noted.     Onychomycosis  Pain in toes right foot  Pain in toes left foot  Debridement  of nails  1-5  B/L with a nail nipper.  Nails were then filed using a dremel tool with no incidents.  .  RTC 9 weeks.   Helane Gunther DPM

## 2021-03-07 DIAGNOSIS — M0579 Rheumatoid arthritis with rheumatoid factor of multiple sites without organ or systems involvement: Secondary | ICD-10-CM | POA: Diagnosis not present

## 2021-04-03 ENCOUNTER — Encounter: Payer: Self-pay | Admitting: Podiatry

## 2021-04-03 ENCOUNTER — Ambulatory Visit (INDEPENDENT_AMBULATORY_CARE_PROVIDER_SITE_OTHER): Payer: PPO | Admitting: Podiatry

## 2021-04-03 ENCOUNTER — Other Ambulatory Visit: Payer: Self-pay

## 2021-04-03 DIAGNOSIS — B351 Tinea unguium: Secondary | ICD-10-CM

## 2021-04-03 DIAGNOSIS — M069 Rheumatoid arthritis, unspecified: Secondary | ICD-10-CM

## 2021-04-03 DIAGNOSIS — M79676 Pain in unspecified toe(s): Secondary | ICD-10-CM | POA: Diagnosis not present

## 2021-04-03 DIAGNOSIS — M2042 Other hammer toe(s) (acquired), left foot: Secondary | ICD-10-CM | POA: Diagnosis not present

## 2021-04-03 DIAGNOSIS — M2010 Hallux valgus (acquired), unspecified foot: Secondary | ICD-10-CM | POA: Diagnosis not present

## 2021-04-03 DIAGNOSIS — M2041 Other hammer toe(s) (acquired), right foot: Secondary | ICD-10-CM | POA: Diagnosis not present

## 2021-04-03 NOTE — Progress Notes (Signed)
This patient returns to the office for evaluation and treatment of long thick painful nails .  This patient is unable to trim her own nails since the patient cannot reach the feet.  Patient says the nails are painful walking and wearing his shoes. Patient has severe RA which has led to bunions and deformed  contracted toes. She returns for preventive foot care services.  General Appearance  Alert, conversant and in no acute stress.  Vascular  Dorsalis pedis and posterior tibial  pulses are palpable  bilaterally.  Capillary return is within normal limits  bilaterally. Temperature is within normal limits  bilaterally.  Neurologic  Senn-Weinstein monofilament wire test within normal limits  bilaterally. Muscle power within normal limits bilaterally.  Nails Thick disfigured discolored nails with subungual debris  from hallux to fifth toes bilaterally. No evidence of bacterial infection or drainage bilaterally.  Orthopedic  No limitations of motion  feet .  No crepitus or effusions noted. 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  Hammer/contracted toes  B/l  Acquired pes planus    Skin  normotropic skin with no porokeratosis noted bilaterally.  No signs of infections or ulcers noted.     Onychomycosis  Pain in toes right foot  Pain in toes left foot  Debridement  of nails  1-5  B/L with a nail nipper.  Nails were then filed using a dremel tool with no incidents.  .  RTC 9 weeks.   Arionna Hoggard DPM  

## 2021-04-06 ENCOUNTER — Emergency Department: Payer: PPO

## 2021-04-06 ENCOUNTER — Other Ambulatory Visit: Payer: Self-pay

## 2021-04-06 ENCOUNTER — Emergency Department
Admission: EM | Admit: 2021-04-06 | Discharge: 2021-04-06 | Disposition: A | Discharge status: Home / Self Care | Payer: PPO | Attending: Emergency Medicine | Admitting: Emergency Medicine

## 2021-04-06 DIAGNOSIS — E039 Hypothyroidism, unspecified: Secondary | ICD-10-CM | POA: Insufficient documentation

## 2021-04-06 DIAGNOSIS — Z7982 Long term (current) use of aspirin: Secondary | ICD-10-CM | POA: Diagnosis not present

## 2021-04-06 DIAGNOSIS — J9811 Atelectasis: Secondary | ICD-10-CM | POA: Diagnosis not present

## 2021-04-06 DIAGNOSIS — I10 Essential (primary) hypertension: Secondary | ICD-10-CM | POA: Diagnosis not present

## 2021-04-06 DIAGNOSIS — R0789 Other chest pain: Secondary | ICD-10-CM | POA: Insufficient documentation

## 2021-04-06 DIAGNOSIS — Z96641 Presence of right artificial hip joint: Secondary | ICD-10-CM | POA: Diagnosis not present

## 2021-04-06 DIAGNOSIS — Z79899 Other long term (current) drug therapy: Secondary | ICD-10-CM | POA: Diagnosis not present

## 2021-04-06 DIAGNOSIS — R079 Chest pain, unspecified: Secondary | ICD-10-CM | POA: Diagnosis not present

## 2021-04-06 DIAGNOSIS — Y92410 Unspecified street and highway as the place of occurrence of the external cause: Secondary | ICD-10-CM | POA: Insufficient documentation

## 2021-04-06 DIAGNOSIS — R000 Tachycardia, unspecified: Secondary | ICD-10-CM | POA: Diagnosis not present
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
## 2021-04-06 NOTE — Discharge Instructions (Signed)
Please seek medical attention for any high fevers, chest pain, shortness of breath, change in behavior, persistent vomiting, bloody stool or any other new or concerning symptoms.  

## 2021-05-02 DIAGNOSIS — M0579 Rheumatoid arthritis with rheumatoid factor of multiple sites without organ or systems involvement: Secondary | ICD-10-CM | POA: Diagnosis not present

## 2021-06-12 ENCOUNTER — Encounter: Payer: Self-pay | Admitting: Podiatry

## 2021-06-12 ENCOUNTER — Ambulatory Visit (INDEPENDENT_AMBULATORY_CARE_PROVIDER_SITE_OTHER): Payer: PPO | Admitting: Podiatry

## 2021-06-12 ENCOUNTER — Other Ambulatory Visit: Payer: Self-pay

## 2021-06-12 DIAGNOSIS — M2042 Other hammer toe(s) (acquired), left foot: Secondary | ICD-10-CM | POA: Diagnosis not present

## 2021-06-12 DIAGNOSIS — B351 Tinea unguium: Secondary | ICD-10-CM | POA: Diagnosis not present

## 2021-06-12 DIAGNOSIS — M069 Rheumatoid arthritis, unspecified: Secondary | ICD-10-CM

## 2021-06-12 DIAGNOSIS — M2041 Other hammer toe(s) (acquired), right foot: Secondary | ICD-10-CM

## 2021-06-12 DIAGNOSIS — M79676 Pain in unspecified toe(s): Secondary | ICD-10-CM | POA: Diagnosis not present

## 2021-06-12 DIAGNOSIS — M2010 Hallux valgus (acquired), unspecified foot: Secondary | ICD-10-CM

## 2021-06-12 NOTE — Progress Notes (Signed)
This patient returns to the office for evaluation and treatment of long thick painful nails .  This patient is unable to trim her own nails since the patient cannot reach the feet.  Patient says the nails are painful walking and wearing his shoes. Patient has severe RA which has led to bunions and deformed  contracted toes. She returns for preventive foot care services.  General Appearance  Alert, conversant and in no acute stress.  Vascular  Dorsalis pedis and posterior tibial  pulses are palpable  bilaterally.  Capillary return is within normal limits  bilaterally. Temperature is within normal limits  bilaterally.  Neurologic  Senn-Weinstein monofilament wire test within normal limits  bilaterally. Muscle power within normal limits bilaterally.  Nails Thick disfigured discolored nails with subungual debris  from hallux to fifth toes bilaterally. No evidence of bacterial infection or drainage bilaterally.  Orthopedic  No limitations of motion  feet .  No crepitus or effusions noted. 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  Hammer/contracted toes  B/l  Acquired pes planus    Skin  normotropic skin with no porokeratosis noted bilaterally.  No signs of infections or ulcers noted.     Onychomycosis  Pain in toes right foot  Pain in toes left foot  Debridement  of nails  1-5  B/L with a nail nipper.  Nails were then filed using a dremel tool with no incidents.  .  RTC 9 weeks.   Haydon Kalmar DPM  

## 2021-06-20 ENCOUNTER — Inpatient Hospital Stay: Payer: PPO | Attending: Oncology | Admitting: Oncology

## 2021-06-20 ENCOUNTER — Inpatient Hospital Stay: Payer: PPO

## 2021-06-20 ENCOUNTER — Encounter: Payer: Self-pay | Admitting: Oncology

## 2021-06-20 ENCOUNTER — Other Ambulatory Visit: Payer: Self-pay

## 2021-06-20 VITALS — BP 149/63 | HR 90 | Temp 98.2°F | Resp 16 | Wt 113.1 lb

## 2021-06-20 DIAGNOSIS — M810 Age-related osteoporosis without current pathological fracture: Secondary | ICD-10-CM | POA: Diagnosis not present

## 2021-06-20 DIAGNOSIS — D638 Anemia in other chronic diseases classified elsewhere: Secondary | ICD-10-CM

## 2021-06-20 DIAGNOSIS — D890 Polyclonal hypergammaglobulinemia: Secondary | ICD-10-CM | POA: Diagnosis not present

## 2021-06-20 DIAGNOSIS — D509 Iron deficiency anemia, unspecified: Secondary | ICD-10-CM | POA: Insufficient documentation

## 2021-06-20 DIAGNOSIS — Z800 Family history of malignant neoplasm of digestive organs: Secondary | ICD-10-CM | POA: Diagnosis not present

## 2021-06-20 DIAGNOSIS — E785 Hyperlipidemia, unspecified: Secondary | ICD-10-CM | POA: Diagnosis not present

## 2021-06-20 DIAGNOSIS — M069 Rheumatoid arthritis, unspecified: Secondary | ICD-10-CM | POA: Diagnosis not present

## 2021-06-20 LAB — IRON AND TIBC
Iron: 19 ug/dL — ABNORMAL LOW (ref 28–170)
Saturation Ratios: 12 % (ref 10.4–31.8)
TIBC: 158 ug/dL — ABNORMAL LOW (ref 250–450)
UIBC: 139 ug/dL

## 2021-06-20 LAB — CBC WITH DIFFERENTIAL/PLATELET
Abs Immature Granulocytes: 0.06 10*3/uL (ref 0.00–0.07)
Basophils Absolute: 0 10*3/uL (ref 0.0–0.1)
Basophils Relative: 0 %
Eosinophils Absolute: 0.1 10*3/uL (ref 0.0–0.5)
Eosinophils Relative: 1 %
HCT: 39.2 % (ref 36.0–46.0)
Hemoglobin: 12.5 g/dL (ref 12.0–15.0)
Immature Granulocytes: 1 %
Lymphocytes Relative: 5 %
Lymphs Abs: 0.5 10*3/uL — ABNORMAL LOW (ref 0.7–4.0)
MCH: 29.8 pg (ref 26.0–34.0)
MCHC: 31.9 g/dL (ref 30.0–36.0)
MCV: 93.3 fL (ref 80.0–100.0)
Monocytes Absolute: 0.5 10*3/uL (ref 0.1–1.0)
Monocytes Relative: 5 %
Neutro Abs: 8.5 10*3/uL — ABNORMAL HIGH (ref 1.7–7.7)
Neutrophils Relative %: 88 %
Platelets: 423 10*3/uL — ABNORMAL HIGH (ref 150–400)
RBC: 4.2 MIL/uL (ref 3.87–5.11)
RDW: 13.2 % (ref 11.5–15.5)
WBC: 9.6 10*3/uL (ref 4.0–10.5)
nRBC: 0 % (ref 0.0–0.2)

## 2021-06-20 LAB — FERRITIN: Ferritin: 1059 ng/mL — ABNORMAL HIGH (ref 11–307)

## 2021-06-20 NOTE — Progress Notes (Signed)
Hematology/Oncology Consult note [facility]  Telephone:([phone] [phone]  Patient Care Team: [name], MD as PCP - General (Family Medicine)   Name of the patient: [name]  [id number]  November 13, 1940   Date of visit: 06/20/21  Diagnosis- anemia likely due to iron deficiency and hypothyroidism as well as anemia of chronic disease  Chief complaint/ Reason for visit-routine follow-up of anemia  Heme/Onc history: patient is a 80 year old female with a past medical history significant for hyperlipidemia, osteoporosis and arthritis. Recent blood work from 11/14/2016 showed B-12 level of 450. CBC showed white count of 8.3, H&H of 9.8/34 with an MCV of 77.4 and platelet count of 553. Her hemoglobin has been ranging around 9 05/26/2016 and a platelet count has been in the 500s since then as well. BMP showed elevated blood sugar of 200 and mild hyponatremia. BUN and creatinine were within normal limits. Iron studies revealed decreased iron of less than 10, iron saturation of less than 4%, TIBC of 252. Urinalysis did not reveal any hematuria.   Patient lives alone and is independent of her ADLs but her mobility is limited because of her bilateral leg swelling. She takes Celebrex daily for arthritis. Denies any routine use of other NSAIDs. Denies any blood in her st no family history of colon cancerools.    Further blood work from 11/30/2016 was as follows: CBC showed hemoglobin of 10.4/32.2 with an MCV of 69.4 and a platelet count of 559. TSH was elevated at 11.7. Haptoglobin was elevated at 3 CN reticulocyte count was low at 0.9. H pylori stool antigen was negative. Celiac disease panel was within normal limits. Multiple myeloma panel revealed polyclonal gammopathy.   Patient was referred to GI but refused any endoscopy. She received 2 doses of feraheme with improvement of her anemia in the past  Interval history-patient continues to live alone and  is independent of her ADLs and IADLs.  She has chronic joint deformities especially in her right hand which makes it difficult for her to perform her ADLs.  Denies any blood loss in her stool or urine.  ECOG PS- 2 Pain scale- 4   Review of systems- Review of Systems  Constitutional:  Positive for malaise/fatigue. Negative for chills, fever and weight loss.  HENT:  Negative for congestion, ear discharge and nosebleeds.   Eyes:  Negative for blurred vision.  Respiratory:  Negative for cough, hemoptysis, sputum production, shortness of breath and wheezing.   Cardiovascular:  Negative for chest pain, palpitations, orthopnea and claudication.  Gastrointestinal:  Negative for abdominal pain, blood in stool, constipation, diarrhea, heartburn, melena, nausea and vomiting.  Genitourinary:  Negative for dysuria, flank pain, frequency, hematuria and urgency.  Musculoskeletal:  Positive for joint pain. Negative for back pain and myalgias.  Skin:  Negative for rash.  Neurological:  Negative for dizziness, tingling, focal weakness, seizures, weakness and headaches.  Endo/Heme/Allergies:  Does not bruise/bleed easily.  Psychiatric/Behavioral:  Negative for depression and suicidal ideas. The patient does not have insomnia.      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   Procedure: TOTAL HIP ARTHROPLASTY;  Surgeon: Dereck Leep, MD;  Location: [facility] ORS;  Service: Orthopedics;  Laterality: Left;    Social History   Socioeconomic History   Marital status: Widowed    Spouse name: Not on file   Number of children: Not on file   Years of education: Not on file   Highest education level: Not on file  Occupational History   Not on file  Tobacco Use   Smoking status:  Never   Smokeless tobacco: Never  Vaping Use   Vaping Use: Never used  Substance and Sexual Activity   Alcohol use: No   Drug use: No   Sexual activity: Never  Other Topics Concern   Not on file  Social History Narrative   Not on file   Social Determinants of Health   Financial Resource Strain: Not on file  Food Insecurity: Not on file  Transportation Needs: Not on file  Physical Activity: Not on file  Stress: Not on file  Social Connections: Not on file  Intimate Partner Violence: Not on file    Family History  Problem Relation Age of Onset   Pancreatic cancer Mother    Rheum arthritis Father    Congestive Heart Failure Father    Pancreatic cancer Maternal Aunt    Heart attack Maternal Grandfather    Arthritis Paternal Grandmother    Arthritis Paternal Grandfather      Current Outpatient Medications:    acetaminophen (TYLENOL) 500 MG tablet, Take 500 mg by mouth 2 (two) times daily. , Disp: , Rfl:    alendronate (FOSAMAX) 70 MG tablet, Take 70 mg by mouth once a week., Disp: , Rfl:    aspirin EC 81 MG tablet, Take 81 mg by mouth daily. , Disp: , Rfl:    cholecalciferol (VITAMIN D) 1000 units tablet, Take 1,000 Units by mouth daily., Disp: , Rfl:    diclofenac sodium (VOLTAREN) 1 % GEL, Apply 2 g topically 3 (three) times daily as needed. , Disp: , Rfl:    etodolac (LODINE) 500 MG tablet, Take 500 mg by mouth daily as needed., Disp: , Rfl:    ferrous sulfate 324 (65 Fe) MG TBEC, Take 1 tablet by mouth daily. , Disp: , Rfl:    folic acid (FOLVITE) 1 MG tablet, Take 1 mg by mouth daily., Disp: , Rfl:    hydroxychloroquine (PLAQUENIL) 200 MG tablet, One tab daily for rheumatoid arthritis, 30 tabs, Disp: , Rfl:    levothyroxine (SYNTHROID) 75 MCG tablet, TAKE 1 TABLET(75 MCG) BY MOUTH EVERY DAY 30 TO 60 MINUTES BEFORE BREAKFAST ON AN EMPTY STOMACH AND WITH A GLASS OF WATER, Disp: , Rfl:    methotrexate (RHEUMATREX) 2.5 MG tablet, Take 7 tabs (17.5 mg ) weekly, 12 weeks , 84  tabs, Disp: , Rfl:    predniSONE (DELTASONE) 10 MG tablet, Take one tab daily, 30 days, Disp: , Rfl:    vitamin B-12 (CYANOCOBALAMIN) 1000 MCG tablet, Take 1,000 mcg by mouth daily., Disp: , Rfl:   Current Facility-Administered Medications:    betamethasone acetate-betamethasone sodium phosphate (CELESTONE) injection 3 mg, 3 mg, Intramuscular, Once, Evans, Brent M, DPM   betamethasone acetate-betamethasone sodium phosphate (CELESTONE) injection 3 mg, 3 mg, Intramuscular, Once, Edrick Kins, DPM  Physical exam:  Vitals:   06/20/21 0932  BP: (!) 149/63  Pulse: 90  Resp: 16  Temp: 98.2 F (36.8 C)  TempSrc: Tympanic  SpO2: 100%  Weight: 113 lb 1.6 oz (51.3 kg)   Physical Exam Constitutional:      General: She is not in acute distress.  Cardiovascular:     Rate and Rhythm: Normal rate and regular rhythm.     Heart sounds: Normal heart sounds.  Pulmonary:     Effort: Pulmonary effort is normal.     Breath sounds: Normal breath sounds.  Abdominal:     General: Bowel sounds are normal.     Palpations: Abdomen is soft.  Musculoskeletal:     Comments: Chronic joint deformities noted especially in her bilateral hands  Skin:    General: Skin is warm and dry.  Neurological:     Mental Status: She is alert and oriented to person, place, and time.     CMP Latest Ref Rng & Units 09/02/2015  Glucose 65 - 99 mg/dL 94  BUN 6 - 20 mg/dL 7  Creatinine 0.44 - 1.00 mg/dL 0.62  Sodium 135 - 145 mmol/L 135  Potassium 3.5 - 5.1 mmol/L 3.7  Chloride 101 - 111 mmol/L 101  CO2 22 - 32 mmol/L 27  Calcium 8.9 - 10.3 mg/dL 8.2(L)   CBC Latest Ref Rng & Units 06/20/2021  WBC 4.0 - 10.5 K/uL 9.6  Hemoglobin 12.0 - 15.0 g/dL 12.5  Hematocrit 36.0 - 46.0 % 39.2  Platelets 150 - 400 K/uL 423(H)     Assessment and plan- Patient is a 80 y.o. female with anemia of chronic disease and iron deficiency here for routine follow-up  Patient's hemoglobin is remained stable between 12-13 over the last 2  to 3 years.  She has not required any iron since 2019.  She does have anemia of chronic disease as evidenced by an elevated ferritin of 1000As well as a low TIBC of 158.  This is likely secondary to her underlying rheumatoid arthritis.  Ferritin in this case is reactive and not indicative of any iron overload.  Patient does not require follow-up with me at this time given the stability of her hemoglobin and can continue to follow-up with Dr. Providence Crosby.  She can be referred to [place] in the future if questions or concerns arise   Visit Diagnosis 1. Anemia of chronic disease   2. Iron deficiency anemia, unspecified iron deficiency anemia type      Dr. Randa Evens, MD, MPH [facility] [phone] 06/20/2021 1:17 PM

## 2021-06-27 DIAGNOSIS — M0579 Rheumatoid arthritis with rheumatoid factor of multiple sites without organ or systems involvement: Secondary | ICD-10-CM | POA: Diagnosis not present

## 2021-06-27 DIAGNOSIS — M159 Polyosteoarthritis, unspecified: Secondary | ICD-10-CM | POA: Diagnosis not present

## 2021-06-27 DIAGNOSIS — Z79899 Other long term (current) drug therapy: Secondary | ICD-10-CM | POA: Diagnosis not present

## 2021-06-28 DIAGNOSIS — M533 Sacrococcygeal disorders, not elsewhere classified: Secondary | ICD-10-CM | POA: Diagnosis not present

## 2021-07-17 DIAGNOSIS — M810 Age-related osteoporosis without current pathological fracture: Secondary | ICD-10-CM | POA: Diagnosis not present

## 2021-07-17 DIAGNOSIS — Z23 Encounter for immunization: Secondary | ICD-10-CM | POA: Diagnosis not present

## 2021-07-17 DIAGNOSIS — D500 Iron deficiency anemia secondary to blood loss (chronic): Secondary | ICD-10-CM | POA: Diagnosis not present

## 2021-07-17 DIAGNOSIS — E785 Hyperlipidemia, unspecified: Secondary | ICD-10-CM | POA: Diagnosis not present

## 2021-07-17 DIAGNOSIS — D509 Iron deficiency anemia, unspecified: Secondary | ICD-10-CM | POA: Diagnosis not present

## 2021-07-17 DIAGNOSIS — Z79899 Other long term (current) drug therapy: Secondary | ICD-10-CM | POA: Diagnosis not present

## 2021-07-17 DIAGNOSIS — E039 Hypothyroidism, unspecified: Secondary | ICD-10-CM | POA: Diagnosis not present

## 2021-07-17 DIAGNOSIS — M0579 Rheumatoid arthritis with rheumatoid factor of multiple sites without organ or systems involvement: Secondary | ICD-10-CM | POA: Diagnosis not present

## 2021-08-29 DIAGNOSIS — M0579 Rheumatoid arthritis with rheumatoid factor of multiple sites without organ or systems involvement: Secondary | ICD-10-CM | POA: Diagnosis not present

## 2021-08-31 ENCOUNTER — Ambulatory Visit (INDEPENDENT_AMBULATORY_CARE_PROVIDER_SITE_OTHER): Payer: PPO | Admitting: Podiatry

## 2021-08-31 ENCOUNTER — Other Ambulatory Visit: Payer: Self-pay

## 2021-08-31 ENCOUNTER — Encounter: Payer: Self-pay | Admitting: Podiatry

## 2021-08-31 DIAGNOSIS — B351 Tinea unguium: Secondary | ICD-10-CM

## 2021-08-31 DIAGNOSIS — M2010 Hallux valgus (acquired), unspecified foot: Secondary | ICD-10-CM | POA: Diagnosis not present

## 2021-08-31 DIAGNOSIS — M2041 Other hammer toe(s) (acquired), right foot: Secondary | ICD-10-CM

## 2021-08-31 DIAGNOSIS — M79676 Pain in unspecified toe(s): Secondary | ICD-10-CM | POA: Diagnosis not present

## 2021-08-31 DIAGNOSIS — M2042 Other hammer toe(s) (acquired), left foot: Secondary | ICD-10-CM | POA: Diagnosis not present

## 2021-08-31 DIAGNOSIS — M069 Rheumatoid arthritis, unspecified: Secondary | ICD-10-CM

## 2021-08-31 NOTE — Progress Notes (Signed)
This patient returns to the office for evaluation and treatment of long thick painful nails .  This patient is unable to trim her own nails since the patient cannot reach the feet.  Patient says the nails are painful walking and wearing his shoes. Patient has severe RA which has led to bunions and deformed  contracted toes. She returns for preventive foot care services.  General Appearance  Alert, conversant and in no acute stress.  Vascular  Dorsalis pedis and posterior tibial  pulses are palpable  bilaterally.  Capillary return is within normal limits  bilaterally. Temperature is within normal limits  bilaterally.  Neurologic  Senn-Weinstein monofilament wire test within normal limits  bilaterally. Muscle power within normal limits bilaterally.  Nails Thick disfigured discolored nails with subungual debris  from hallux to fifth toes bilaterally. No evidence of bacterial infection or drainage bilaterally.  Orthopedic  No limitations of motion  feet .  No crepitus or effusions noted. 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  Hammer/contracted toes  B/l  Acquired pes planus    Skin  normotropic skin with no porokeratosis noted bilaterally.  No signs of infections or ulcers noted.     Onychomycosis  Pain in toes right foot  Pain in toes left foot  Debridement  of nails  1-5  B/L with a nail nipper.  Nails were then filed using a dremel tool with no incidents.  .  RTC 9 weeks.   Gardiner Barefoot DPM

## 2021-10-24 DIAGNOSIS — Z79899 Other long term (current) drug therapy: Secondary | ICD-10-CM | POA: Diagnosis not present

## 2021-10-24 DIAGNOSIS — M0579 Rheumatoid arthritis with rheumatoid factor of multiple sites without organ or systems involvement: Secondary | ICD-10-CM | POA: Diagnosis not present

## 2021-12-07 ENCOUNTER — Ambulatory Visit (INDEPENDENT_AMBULATORY_CARE_PROVIDER_SITE_OTHER): Payer: PPO | Admitting: Podiatry

## 2021-12-07 ENCOUNTER — Encounter: Payer: Self-pay | Admitting: Podiatry

## 2021-12-07 DIAGNOSIS — M2010 Hallux valgus (acquired), unspecified foot: Secondary | ICD-10-CM

## 2021-12-07 DIAGNOSIS — M2041 Other hammer toe(s) (acquired), right foot: Secondary | ICD-10-CM | POA: Diagnosis not present

## 2021-12-07 DIAGNOSIS — L03031 Cellulitis of right toe: Secondary | ICD-10-CM | POA: Diagnosis not present

## 2021-12-07 DIAGNOSIS — M2042 Other hammer toe(s) (acquired), left foot: Secondary | ICD-10-CM

## 2021-12-07 DIAGNOSIS — M069 Rheumatoid arthritis, unspecified: Secondary | ICD-10-CM

## 2021-12-07 DIAGNOSIS — B351 Tinea unguium: Secondary | ICD-10-CM

## 2021-12-07 DIAGNOSIS — M79676 Pain in unspecified toe(s): Secondary | ICD-10-CM

## 2021-12-07 MED ORDER — DOXYCYCLINE HYCLATE 100 MG PO TABS: 100.0000 mg | ORAL_TABLET | Freq: Two times a day (BID) | ORAL | 0 refills | Status: DC

## 2021-12-07 NOTE — Progress Notes (Signed)
This patient returns to the office for evaluation and treatment of long thick painful nails .  This patient is unable to trim her own nails since the patient cannot reach the feet.  Patient says the nails are painful walking and wearing his shoes. Patient has severe RA which has led to bunions and deformed  contracted toes. She returns for preventive foot care services. ? ?General Appearance  Alert, conversant and in no acute stress. ? ?Vascular  Dorsalis pedis and posterior tibial  pulses are palpable  bilaterally.  Capillary return is within normal limits  bilaterally. Temperature is within normal limits  bilaterally. ? ?Neurologic  Senn-Weinstein monofilament wire test within normal limits  bilaterally. Muscle power within normal limits bilaterally. ? ?Nails Thick disfigured discolored nails with subungual debris  from hallux to fifth toes bilaterally. Red inflamed medial border right great toenail.  Desquamation noted at site of infection. ? ?Orthopedic  No limitations of motion  feet .  No crepitus or effusions noted. 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  Hammer/contracted toes  B/l  Acquired pes planus   ? ?Skin  normotropic skin with no porokeratosis noted bilaterally.  No signs of infections or ulcers noted.    ? ?Onychomycosis  Pain in toes right foot  Pain in toes left foot  Paronychia right hallux.  Incision and Drainage of paronychia.  Neosporin/DSD.  Prescribe doxycycline.  Told her to peroxide and separately epsom salts.  Call next week if the infection persists. ? ?Debridement  of nails  1-5  B/L with a nail nipper.  Nails were then filed using a dremel tool with no incidents.  .  RTC 9 weeks. ? ? ?Helane Gunther DPM  ?

## 2021-12-19 DIAGNOSIS — M0579 Rheumatoid arthritis with rheumatoid factor of multiple sites without organ or systems involvement: Secondary | ICD-10-CM | POA: Diagnosis not present

## 2022-01-09 DIAGNOSIS — N390 Urinary tract infection, site not specified: Secondary | ICD-10-CM | POA: Diagnosis not present

## 2022-01-09 DIAGNOSIS — E785 Hyperlipidemia, unspecified: Secondary | ICD-10-CM | POA: Diagnosis not present

## 2022-01-09 DIAGNOSIS — E039 Hypothyroidism, unspecified: Secondary | ICD-10-CM | POA: Diagnosis not present

## 2022-01-09 DIAGNOSIS — Z0000 Encounter for general adult medical examination without abnormal findings: Secondary | ICD-10-CM | POA: Diagnosis not present

## 2022-01-19 DIAGNOSIS — M810 Age-related osteoporosis without current pathological fracture: Secondary | ICD-10-CM | POA: Diagnosis not present

## 2022-01-19 DIAGNOSIS — Z0000 Encounter for general adult medical examination without abnormal findings: Secondary | ICD-10-CM | POA: Diagnosis not present

## 2022-01-19 DIAGNOSIS — M0579 Rheumatoid arthritis with rheumatoid factor of multiple sites without organ or systems involvement: Secondary | ICD-10-CM | POA: Diagnosis not present

## 2022-01-19 DIAGNOSIS — E785 Hyperlipidemia, unspecified: Secondary | ICD-10-CM | POA: Diagnosis not present

## 2022-01-19 DIAGNOSIS — E039 Hypothyroidism, unspecified: Secondary | ICD-10-CM | POA: Diagnosis not present

## 2022-01-19 DIAGNOSIS — D500 Iron deficiency anemia secondary to blood loss (chronic): Secondary | ICD-10-CM | POA: Diagnosis not present

## 2022-01-23 DIAGNOSIS — R300 Dysuria: Secondary | ICD-10-CM | POA: Diagnosis not present

## 2022-02-08 ENCOUNTER — Ambulatory Visit (INDEPENDENT_AMBULATORY_CARE_PROVIDER_SITE_OTHER): Payer: PPO | Admitting: Podiatry

## 2022-02-08 ENCOUNTER — Encounter: Payer: Self-pay | Admitting: Podiatry

## 2022-02-08 DIAGNOSIS — M79674 Pain in right toe(s): Secondary | ICD-10-CM

## 2022-02-08 DIAGNOSIS — L84 Corns and callosities: Secondary | ICD-10-CM

## 2022-02-08 DIAGNOSIS — M79675 Pain in left toe(s): Secondary | ICD-10-CM

## 2022-02-08 DIAGNOSIS — B351 Tinea unguium: Secondary | ICD-10-CM

## 2022-02-08 DIAGNOSIS — M2010 Hallux valgus (acquired), unspecified foot: Secondary | ICD-10-CM

## 2022-02-08 DIAGNOSIS — L97501 Non-pressure chronic ulcer of other part of unspecified foot limited to breakdown of skin: Secondary | ICD-10-CM | POA: Diagnosis not present

## 2022-02-08 DIAGNOSIS — M79676 Pain in unspecified toe(s): Secondary | ICD-10-CM

## 2022-02-08 DIAGNOSIS — M2041 Other hammer toe(s) (acquired), right foot: Secondary | ICD-10-CM

## 2022-02-08 DIAGNOSIS — M069 Rheumatoid arthritis, unspecified: Secondary | ICD-10-CM | POA: Diagnosis not present

## 2022-02-08 NOTE — Progress Notes (Signed)
This patient returns to the office for evaluation and treatment of long thick painful nails  and newly formed corns both feet.  .  This patient is unable to trim her own nails and calluses  since the patient cannot reach the feet.  Patient says the nails are painful walking and wearing his shoes. Patient has severe RA which has led to bunions and deformed  contracted toes.  She says she has purchased a new pair of shoes and has worn them for the last four weeks.  She says they have continually hurt her feet and toes. She returns for preventive foot care services.  General Appearance  Alert, conversant and in no acute stress.  Vascular  Dorsalis pedis and posterior tibial  pulses are palpable  bilaterally.  Capillary return is within normal limits  bilaterally. Temperature is within normal limits  bilaterally.  Neurologic  Senn-Weinstein monofilament wire test within normal limits  bilaterally. Muscle power within normal limits bilaterally.  Nails Thick disfigured discolored nails with subungual debris  from hallux to fifth toes bilaterally. Paronychia healing 3rd toe left foot.  Paronychia second toe right foot.  Orthopedic  No limitations of motion  feet .  No crepitus or effusions noted. 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  Hammer/contracted toes  B/l  Acquired pes planus   Corn 4th toe left foot and necrotic tissue noted at the HAV left foot.  Corn 3rd toe right foot.  Open wound noted 4th toe right foot.    Skin  normotropic skin with no porokeratosis noted bilaterally.  No signs of infections or ulcers noted.     Onychomycosis  Pain in toes right foot  Pain in toes left foot  Corns noted multiple digits  B/L.  Open wound noted 4th toe right foot.  Betadine/DSD.  No infection or drainage.       Debridement  of nails  1-5  B/L with a nail nipper.  Nails were then filed using a dremel tool with no incidents. Betadine/DSD open wound 4th toe right.  Debridement of corns with # 15 blade  and dremel tool. Patient needs to consider proper shoes for her contracted digits.  Joni Reining contacted Christan io inquire. Marland Kitchen  RTC 9 weeks.   Helane Gunther DPM

## 2022-02-13 DIAGNOSIS — M0579 Rheumatoid arthritis with rheumatoid factor of multiple sites without organ or systems involvement: Secondary | ICD-10-CM | POA: Diagnosis not present

## 2022-02-13 DIAGNOSIS — M810 Age-related osteoporosis without current pathological fracture: Secondary | ICD-10-CM | POA: Diagnosis not present

## 2022-04-09 ENCOUNTER — Encounter: Payer: Self-pay | Admitting: Oncology

## 2022-04-10 DIAGNOSIS — M0579 Rheumatoid arthritis with rheumatoid factor of multiple sites without organ or systems involvement: Secondary | ICD-10-CM | POA: Diagnosis not present

## 2022-04-16 ENCOUNTER — Encounter: Payer: Self-pay | Admitting: Podiatry

## 2022-04-16 ENCOUNTER — Ambulatory Visit (INDEPENDENT_AMBULATORY_CARE_PROVIDER_SITE_OTHER): Payer: PPO | Admitting: Podiatry

## 2022-04-16 DIAGNOSIS — L03032 Cellulitis of left toe: Secondary | ICD-10-CM

## 2022-04-16 DIAGNOSIS — M2041 Other hammer toe(s) (acquired), right foot: Secondary | ICD-10-CM | POA: Diagnosis not present

## 2022-04-16 DIAGNOSIS — L02612 Cutaneous abscess of left foot: Secondary | ICD-10-CM

## 2022-04-16 DIAGNOSIS — B351 Tinea unguium: Secondary | ICD-10-CM

## 2022-04-16 DIAGNOSIS — M069 Rheumatoid arthritis, unspecified: Secondary | ICD-10-CM

## 2022-04-16 DIAGNOSIS — M2042 Other hammer toe(s) (acquired), left foot: Secondary | ICD-10-CM | POA: Diagnosis not present

## 2022-04-16 DIAGNOSIS — L02619 Cutaneous abscess of unspecified foot: Secondary | ICD-10-CM | POA: Insufficient documentation

## 2022-04-16 DIAGNOSIS — L97501 Non-pressure chronic ulcer of other part of unspecified foot limited to breakdown of skin: Secondary | ICD-10-CM

## 2022-04-16 DIAGNOSIS — M79676 Pain in unspecified toe(s): Secondary | ICD-10-CM | POA: Diagnosis not present

## 2022-04-16 MED ORDER — DOXYCYCLINE HYCLATE 100 MG PO TABS: 100.0000 mg | ORAL_TABLET | Freq: Two times a day (BID) | ORAL | 0 refills | Status: DC

## 2022-04-16 NOTE — Progress Notes (Signed)
This patient returns to the office for evaluation and treatment of long thick painful nails  and newly formed corns both feet.  .  This patient is unable to trim her own nails and calluses  since the patient cannot reach the feet.  Patient says the nails are painful walking and wearing his shoes. Patient has severe RA which has led to bunions and deformed  contracted toes.   She says her toes  have continually hurt .  She says she has developed a red swollen third toe left foot.  She presents to the office with a band-aid and saying she is soaking her foot in epsom salts.  The ulcer on her right foot on her digits has healed.   She returns for preventive foot care services.  General Appearance  Alert, conversant and in no acute stress.  Vascular  Dorsalis pedis and posterior tibial  pulses are palpable  bilaterally.  Capillary return is within normal limits  bilaterally. Temperature is within normal limits  bilaterally.  Neurologic  Senn-Weinstein monofilament wire test within normal limits  bilaterally. Muscle power within normal limits bilaterally.  Nails Thick disfigured discolored nails with subungual debris  from hallux to fifth toes bilaterally.   Paronychia second toe right foot.  Granuloma 1st interspace left foot.  Ulcer noted on dorsum of her third toe left foot.  Orthopedic  No limitations of motion  feet .  No crepitus or effusions noted. 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  Hammer/contracted toes  B/l    Skin  normotropic skin with no porokeratosis noted bilaterally.    Onychomycosis  Pain in toes right foot  Pain in toes left foot  Corns noted multiple digits  B/L.  Infected ulcer third toe left foot.  Granuloma 1st interspace left foot.   Debridement  of nails  1-5  B/L with a nail nipper.  Nails were then filed using a dremel tool with no incidents.  Neosporin/DSD 3rd toe left foot.   Debridement of corns with # 15 blade and dremel tool.  Marland Kitchen RTC 10 days to see Dr.  Allena Katz  for second opinion.  Prescribe doxycycline for 10 days.   RTC 9 weeks for nail care.     Helane Gunther DPM

## 2022-04-19 ENCOUNTER — Ambulatory Visit (INDEPENDENT_AMBULATORY_CARE_PROVIDER_SITE_OTHER): Payer: PPO | Admitting: Podiatry

## 2022-04-19 DIAGNOSIS — L989 Disorder of the skin and subcutaneous tissue, unspecified: Secondary | ICD-10-CM | POA: Diagnosis not present

## 2022-04-25 NOTE — Progress Notes (Signed)
Subjective:  Patient ID: [name], female    DOB: 1940/11/04,  MRN: [id number]  Chief Complaint  Patient presents with   Diabetic Ulcer    81 y.o. female presents with the above complaint.  Patient presents with complaint of left second digit granuloma/benign skin lesion.  Patient states it is painful to touch is progressive gotten worse and sometimes bleed has been causing her some discomfort especially with rotation.  She was trying to conservatively manage however she would like to have it removed at this time.  It is on the medial surface of the toe.  She has not seen anyone else prior to seeing me she is not a diabetic.  She denies any other acute complaints.  She is known to Dr. Stacie Acres.   Review of Systems: Negative except as noted in the HPI. Denies N/V/F/Ch.  Past Medical History:  Diagnosis Date   Anemia    Arthritis    Hypertension     Current Outpatient Medications:    acetaminophen (TYLENOL) 500 MG tablet, Take 500 mg by mouth 2 (two) times daily. , Disp: , Rfl:    alendronate (FOSAMAX) 70 MG tablet, Take 70 mg by mouth once a week., Disp: , Rfl:    aspirin EC 81 MG tablet, Take 81 mg by mouth daily. , Disp: , Rfl:    cholecalciferol (VITAMIN D) 1000 units tablet, Take 1,000 Units by mouth daily., Disp: , Rfl:    diclofenac sodium (VOLTAREN) 1 % GEL, Apply 2 g topically 3 (three) times daily as needed. , Disp: , Rfl:    doxycycline (VIBRA-TABS) 100 MG tablet, Take 1 tablet (100 mg total) by mouth 2 (two) times daily., Disp: 20 tablet, Rfl: 0   etodolac (LODINE) 500 MG tablet, Take 500 mg by mouth daily as needed., Disp: , Rfl:    ferrous sulfate 324 (65 Fe) MG TBEC, Take 1 tablet by mouth daily. , Disp: , Rfl:    folic acid (FOLVITE) 1 MG tablet, Take 1 mg by mouth daily., Disp: , Rfl:    hydroxychloroquine (PLAQUENIL) 200 MG tablet, One tab daily for rheumatoid arthritis, 30 tabs, Disp: , Rfl:    levothyroxine (SYNTHROID) 75 MCG tablet, TAKE 1 TABLET(75 MCG) BY MOUTH  EVERY DAY 30 TO 60 MINUTES BEFORE BREAKFAST ON AN EMPTY STOMACH AND WITH A GLASS OF WATER, Disp: , Rfl:    methotrexate (RHEUMATREX) 2.5 MG tablet, Take 7 tabs (17.5 mg ) weekly, 12 weeks , 84 tabs, Disp: , Rfl:    predniSONE (DELTASONE) 10 MG tablet, Take one tab daily, 30 days, Disp: , Rfl:    vitamin B-12 (CYANOCOBALAMIN) 1000 MCG tablet, Take 1,000 mcg by mouth daily., Disp: , Rfl:   Current Facility-Administered Medications:    betamethasone acetate-betamethasone sodium phosphate (CELESTONE) injection 3 mg, 3 mg, Intramuscular, Once, Evans, Brent M, DPM   betamethasone acetate-betamethasone sodium phosphate (CELESTONE) injection 3 mg, 3 mg, Intramuscular, Once, Felecia Shelling, DPM  Social History   Tobacco Use  Smoking Status Never  Smokeless Tobacco Never    No Known Allergies Objective:  There were no vitals filed for this visit. There is no height or weight on file to calculate BMI. Constitutional Well developed. Well nourished.  Vascular Dorsalis pedis pulses palpable bilaterally. Posterior tibial pulses palpable bilaterally. Capillary refill normal to all digits.  No cyanosis or clubbing noted. Pedal hair growth normal.  Neurologic Normal speech. Oriented to person, place, and time. Epicritic sensation to light touch grossly present bilaterally.  Dermatologic Granuloma noted to the left second medial surface.  Pain on palpation.  Likely due to pressure.  No weird abnormality noted.  No asymmetry noted.  Orthopedic: Normal joint ROM without pain or crepitus bilaterally. No visible deformities. No bony tenderness.   Radiographs: None Assessment:   1. Benign skin lesion    Plan:  Patient was evaluated and treated and all questions answered.  Left second digit benign skin lesion/granuloma -All questions or concerns were discussed with the patient in extensive detail.  Given the amount of pain that she is experiencing she will benefit from excision of benign skin  lesion/growth.  I discussed this with patient she states understand like to proceed with that -Excision of the skin lesion Skin was prepped in standard technique with Betadine.  One-to-one mixture of 1% lidocaine plain half percent Marcaine plain was injected circumferentially in V-block fashion 3 cc.  Using chisel blade and a pickup the lesion was disarticulated from main body of the toe.  The skin lesion was measuring 0.8 cm x 0.3 cm x 0.1 cm.  The lesion was discarded.  He wound site was dressed with triple antibiotic 4 x 4 gauze Kerlix and Ace bandage.  I have asked the patient to keep it covered with Neosporin and a Band-Aid until healing. -I discussed shoe gear modification and padding protecting with spacer as well.   No follow-ups on file.

## 2022-05-31 ENCOUNTER — Ambulatory Visit (INDEPENDENT_AMBULATORY_CARE_PROVIDER_SITE_OTHER): Payer: PPO | Admitting: Podiatry

## 2022-05-31 DIAGNOSIS — L989 Disorder of the skin and subcutaneous tissue, unspecified: Secondary | ICD-10-CM | POA: Diagnosis not present

## 2022-05-31 NOTE — Progress Notes (Signed)
Subjective:  Patient ID: AMARACHI KOTZ, female    DOB: Jan 11, 1941,  MRN: [id number]  Chief Complaint  Patient presents with   Toe Pain    81 y.o. female presents with the above complaint.  Patient presents for follow-up of left second digit granuloma/benign skin lesion.  She states she is doing a lot better no further pain no acute complaints   Review of Systems: Negative except as noted in the HPI. Denies N/V/F/Ch.  Past Medical History:  Diagnosis Date   Anemia    Arthritis    Hypertension     Current Outpatient Medications:    acetaminophen (TYLENOL) 500 MG tablet, Take 500 mg by mouth 2 (two) times daily. , Disp: , Rfl:    alendronate (FOSAMAX) 70 MG tablet, Take 70 mg by mouth once a week., Disp: , Rfl:    aspirin EC 81 MG tablet, Take 81 mg by mouth daily. , Disp: , Rfl:    cholecalciferol (VITAMIN D) 1000 units tablet, Take 1,000 Units by mouth daily., Disp: , Rfl:    diclofenac sodium (VOLTAREN) 1 % GEL, Apply 2 g topically 3 (three) times daily as needed. , Disp: , Rfl:    doxycycline (VIBRA-TABS) 100 MG tablet, Take 1 tablet (100 mg total) by mouth 2 (two) times daily., Disp: 20 tablet, Rfl: 0   etodolac (LODINE) 500 MG tablet, Take 500 mg by mouth daily as needed., Disp: , Rfl:    ferrous sulfate 324 (65 Fe) MG TBEC, Take 1 tablet by mouth daily. , Disp: , Rfl:    folic acid (FOLVITE) 1 MG tablet, Take 1 mg by mouth daily., Disp: , Rfl:    hydroxychloroquine (PLAQUENIL) 200 MG tablet, One tab daily for rheumatoid arthritis, 30 tabs, Disp: , Rfl:    levothyroxine (SYNTHROID) 75 MCG tablet, TAKE 1 TABLET(75 MCG) BY MOUTH EVERY DAY 30 TO 60 MINUTES BEFORE BREAKFAST ON AN EMPTY STOMACH AND WITH A GLASS OF WATER, Disp: , Rfl:    methotrexate (RHEUMATREX) 2.5 MG tablet, Take 7 tabs (17.5 mg ) weekly, 12 weeks , 84 tabs, Disp: , Rfl:    predniSONE (DELTASONE) 10 MG tablet, Take one tab daily, 30 days, Disp: , Rfl:    vitamin B-12 (CYANOCOBALAMIN) 1000 MCG tablet, Take 1,000  mcg by mouth daily., Disp: , Rfl:   Current Facility-Administered Medications:    betamethasone acetate-betamethasone sodium phosphate (CELESTONE) injection 3 mg, 3 mg, Intramuscular, Once, Evans, Brent M, DPM   betamethasone acetate-betamethasone sodium phosphate (CELESTONE) injection 3 mg, 3 mg, Intramuscular, Once, Edrick Kins, DPM  Social History   Tobacco Use  Smoking Status Never  Smokeless Tobacco Never    No Known Allergies Objective:  There were no vitals filed for this visit. There is no height or weight on file to calculate BMI. Constitutional Well developed. Well nourished.  Vascular Dorsalis pedis pulses palpable bilaterally. Posterior tibial pulses palpable bilaterally. Capillary refill normal to all digits.  No cyanosis or clubbing noted. Pedal hair growth normal.  Neurologic Normal speech. Oriented to person, place, and time. Epicritic sensation to light touch grossly present bilaterally.  Dermatologic No further granuloma noted to the left second medial surface.  No further pain on palpation.    Orthopedic: Normal joint ROM without pain or crepitus bilaterally. No visible deformities. No bony tenderness.   Radiographs: None Assessment:   No diagnosis found.  Plan:  Patient was evaluated and treated and all questions answered.  Left second digit benign skin lesion/granuloma -Clinically healed after  removal of benign skin lesion.  At this time I discussed with her to keep it nice and dry in between the toes and if it reoccurs to come back and see me.  She is is understanding.  I also discussed shoe gear modification with her as well   No follow-ups on file.

## 2022-06-05 DIAGNOSIS — M0579 Rheumatoid arthritis with rheumatoid factor of multiple sites without organ or systems involvement: Secondary | ICD-10-CM | POA: Diagnosis not present

## 2022-07-27 DIAGNOSIS — D509 Iron deficiency anemia, unspecified: Secondary | ICD-10-CM | POA: Diagnosis not present

## 2022-07-27 DIAGNOSIS — R350 Frequency of micturition: Secondary | ICD-10-CM | POA: Diagnosis not present

## 2022-07-27 DIAGNOSIS — M0579 Rheumatoid arthritis with rheumatoid factor of multiple sites without organ or systems involvement: Secondary | ICD-10-CM | POA: Diagnosis not present

## 2022-07-27 DIAGNOSIS — E039 Hypothyroidism, unspecified: Secondary | ICD-10-CM | POA: Diagnosis not present

## 2022-07-27 DIAGNOSIS — M810 Age-related osteoporosis without current pathological fracture: Secondary | ICD-10-CM | POA: Diagnosis not present

## 2022-07-31 DIAGNOSIS — Z7960 Long term (current) use of unspecified immunomodulators and immunosuppressants: Secondary | ICD-10-CM | POA: Diagnosis not present

## 2022-07-31 DIAGNOSIS — M159 Polyosteoarthritis, unspecified: Secondary | ICD-10-CM | POA: Diagnosis not present

## 2022-07-31 DIAGNOSIS — M0579 Rheumatoid arthritis with rheumatoid factor of multiple sites without organ or systems involvement: Secondary | ICD-10-CM | POA: Diagnosis not present

## 2022-08-02 DIAGNOSIS — Z79899 Other long term (current) drug therapy: Secondary | ICD-10-CM | POA: Diagnosis not present

## 2022-08-02 DIAGNOSIS — D509 Iron deficiency anemia, unspecified: Secondary | ICD-10-CM | POA: Diagnosis not present

## 2022-08-02 DIAGNOSIS — R350 Frequency of micturition: Secondary | ICD-10-CM | POA: Diagnosis not present

## 2022-08-02 DIAGNOSIS — M0579 Rheumatoid arthritis with rheumatoid factor of multiple sites without organ or systems involvement: Secondary | ICD-10-CM | POA: Diagnosis not present

## 2022-08-02 DIAGNOSIS — E039 Hypothyroidism, unspecified: Secondary | ICD-10-CM | POA: Diagnosis not present

## 2022-08-02 DIAGNOSIS — M810 Age-related osteoporosis without current pathological fracture: Secondary | ICD-10-CM | POA: Diagnosis not present

## 2022-08-16 DIAGNOSIS — R300 Dysuria: Secondary | ICD-10-CM | POA: Diagnosis not present

## 2022-08-30 ENCOUNTER — Ambulatory Visit: Payer: PPO | Admitting: Podiatry

## 2022-08-30 DIAGNOSIS — R399 Unspecified symptoms and signs involving the genitourinary system: Secondary | ICD-10-CM | POA: Diagnosis not present

## 2022-08-31 ENCOUNTER — Ambulatory Visit: Payer: PPO | Admitting: Podiatry

## 2022-09-06 ENCOUNTER — Ambulatory Visit: Payer: PPO | Admitting: Podiatry

## 2022-09-24 ENCOUNTER — Ambulatory Visit: Payer: PPO | Admitting: Podiatry

## 2022-10-12 ENCOUNTER — Ambulatory Visit (INDEPENDENT_AMBULATORY_CARE_PROVIDER_SITE_OTHER): Payer: PPO | Admitting: Podiatry

## 2022-10-12 DIAGNOSIS — L97512 Non-pressure chronic ulcer of other part of right foot with fat layer exposed: Secondary | ICD-10-CM | POA: Diagnosis not present

## 2022-10-12 DIAGNOSIS — L539 Erythematous condition, unspecified: Secondary | ICD-10-CM

## 2022-10-12 MED ORDER — DOXYCYCLINE HYCLATE 100 MG PO TABS: 100.0000 mg | ORAL_TABLET | Freq: Two times a day (BID) | ORAL | 0 refills | Status: DC

## 2022-10-12 NOTE — Progress Notes (Signed)
Subjective:  Patient ID: [name], female    DOB: 10/28/1940,  MRN: [id number]  Chief Complaint  Patient presents with   Nail Problem    Nail trim  Skin ulcer     82 y.o. female presents for wound care.  Patient presents with new complaint of right fourth digit ulceration to the tip of the toe.  Patient has been present for quite some time is progressive gotten worse wanted get it evaluated she has not seen been seen by anyone else prior to seeing me for this ulcer.  She is currently not taking antibiotics denies any other acute complaints.  Hurts with ambulation   Review of Systems: Negative except as noted in the HPI. Denies N/V/F/Ch.  Past Medical History:  Diagnosis Date   Anemia    Arthritis    Hypertension     Current Outpatient Medications:    doxycycline (VIBRA-TABS) 100 MG tablet, Take 1 tablet (100 mg total) by mouth 2 (two) times daily., Disp: 60 tablet, Rfl: 0   acetaminophen (TYLENOL) 500 MG tablet, Take 500 mg by mouth 2 (two) times daily. , Disp: , Rfl:    alendronate (FOSAMAX) 70 MG tablet, Take 70 mg by mouth once a week., Disp: , Rfl:    aspirin EC 81 MG tablet, Take 81 mg by mouth daily. , Disp: , Rfl:    cholecalciferol (VITAMIN D) 1000 units tablet, Take 1,000 Units by mouth daily., Disp: , Rfl:    diclofenac sodium (VOLTAREN) 1 % GEL, Apply 2 g topically 3 (three) times daily as needed. , Disp: , Rfl:    doxycycline (VIBRA-TABS) 100 MG tablet, Take 1 tablet (100 mg total) by mouth 2 (two) times daily., Disp: 20 tablet, Rfl: 0   etodolac (LODINE) 500 MG tablet, Take 500 mg by mouth daily as needed., Disp: , Rfl:    ferrous sulfate 324 (65 Fe) MG TBEC, Take 1 tablet by mouth daily. , Disp: , Rfl:    folic acid (FOLVITE) 1 MG tablet, Take 1 mg by mouth daily., Disp: , Rfl:    hydroxychloroquine (PLAQUENIL) 200 MG tablet, One tab daily for rheumatoid arthritis, 30 tabs, Disp: , Rfl:    levothyroxine (SYNTHROID) 75 MCG tablet, TAKE 1 TABLET(75 MCG) BY MOUTH  EVERY DAY 30 TO 60 MINUTES BEFORE BREAKFAST ON AN EMPTY STOMACH AND WITH A GLASS OF WATER, Disp: , Rfl:    methotrexate (RHEUMATREX) 2.5 MG tablet, Take 7 tabs (17.5 mg ) weekly, 12 weeks , 84 tabs, Disp: , Rfl:    predniSONE (DELTASONE) 10 MG tablet, Take one tab daily, 30 days, Disp: , Rfl:    vitamin B-12 (CYANOCOBALAMIN) 1000 MCG tablet, Take 1,000 mcg by mouth daily., Disp: , Rfl:   Current Facility-Administered Medications:    betamethasone acetate-betamethasone sodium phosphate (CELESTONE) injection 3 mg, 3 mg, Intramuscular, Once, Evans, Brent M, DPM   betamethasone acetate-betamethasone sodium phosphate (CELESTONE) injection 3 mg, 3 mg, Intramuscular, Once, Edrick Kins, DPM  Social History   Tobacco Use  Smoking Status Never  Smokeless Tobacco Never    No Known Allergies Objective:  There were no vitals filed for this visit. There is no height or weight on file to calculate BMI. Constitutional Well developed. Well nourished.  Vascular Dorsalis pedis pulses palpable bilaterally. Posterior tibial pulses palpable bilaterally. Capillary refill normal to all digits.  No cyanosis or clubbing noted. Pedal hair growth normal.  Neurologic Normal speech. Oriented to person, place, and time. Protective sensation absent  Dermatologic  Wound Location: Right fourth toe fat layer exposed granular wound base some erythema noted Wound Base: Mixed Granular/Fibrotic Peri-wound: Calloused Exudate: Scant/small amount Serosanguinous exudate Wound Measurements: -See above  Orthopedic: No pain to palpation either foot.   Radiographs: None Assessment:   1. Toe ulcer, right, with fat layer exposed ([place])   2. Erythema    Plan:  Patient was evaluated and treated and all questions answered.  Ulcer right fourth toe ulceration with fat layer exposed with underlying erythema -Debridement as below. -Dressed with beta and wet-to-dry dressing, DSD. -Continue off-loading with surgical  shoe. -Doxycycline was dispensed for skin and soft tissue prophylaxis  Procedure: Excisional Debridement of Wound Tool: Sharp chisel blade/tissue nipper Rationale: Removal of non-viable soft tissue from the wound to promote healing.  Anesthesia: none Pre-Debridement Wound Measurements: 0.5 cm x 0.5 cm x 0.2 cm  Post-Debridement Wound Measurements: 0.6 cm x 0.6 cm x 0.2 cm  Type of Debridement: Sharp Excisional Tissue Removed: Non-viable soft tissue Blood loss: Minimal (<50cc) Depth of Debridement: subcutaneous tissue. Technique: Sharp excisional debridement to bleeding, viable wound base.  Wound Progress: This is my initial evaluation of continue monitor the progression of the wound Site healing conversation 7 Dressing: Dry, sterile, compression dressing. Disposition: Patient tolerated procedure well. Patient to return in 1 week for follow-up.  No follow-ups on file.

## 2022-10-16 DIAGNOSIS — M0579 Rheumatoid arthritis with rheumatoid factor of multiple sites without organ or systems involvement: Secondary | ICD-10-CM | POA: Diagnosis not present

## 2022-10-19 ENCOUNTER — Encounter: Payer: Self-pay | Admitting: Podiatry

## 2022-10-21 DIAGNOSIS — L89301 Pressure ulcer of unspecified buttock, stage 1: Secondary | ICD-10-CM | POA: Diagnosis not present

## 2022-10-21 DIAGNOSIS — L980 Pyogenic granuloma: Secondary | ICD-10-CM | POA: Diagnosis not present

## 2022-10-26 ENCOUNTER — Encounter: Payer: Self-pay | Admitting: Oncology

## 2022-10-26 ENCOUNTER — Ambulatory Visit: Payer: PPO | Admitting: Podiatry

## 2022-11-02 ENCOUNTER — Ambulatory Visit (INDEPENDENT_AMBULATORY_CARE_PROVIDER_SITE_OTHER): Payer: PPO | Admitting: Podiatry

## 2022-11-02 ENCOUNTER — Encounter: Payer: Self-pay | Admitting: Podiatry

## 2022-11-02 VITALS — BP 169/75 | HR 84

## 2022-11-02 DIAGNOSIS — L97512 Non-pressure chronic ulcer of other part of right foot with fat layer exposed: Secondary | ICD-10-CM

## 2022-11-02 NOTE — Progress Notes (Signed)
Subjective:  Patient ID: [name], female    DOB: 25-Nov-1940,  MRN: [id number]  Chief Complaint  Patient presents with   Wound Check    "I think it's just about well.  I went to the walk in clinic by the hospital about a couple of weeks ago.  I've been taking the antibiotic that Dr. Posey Pronto prescribed me."    82 y.o. female presents for wound care.  Patient present with follow-up of right fourth digit ulceration.  She states she is doing a lot better.  She states the doxycycline helped considerably.  She does not have any redness.  She has an appointment scheduled at the wound care center next week   Review of Systems: Negative except as noted in the HPI. Denies N/V/F/Ch.  Past Medical History:  Diagnosis Date   Anemia    Arthritis    Hypertension     Current Outpatient Medications:    acetaminophen (TYLENOL) 500 MG tablet, Take 500 mg by mouth 2 (two) times daily. , Disp: , Rfl:    alendronate (FOSAMAX) 70 MG tablet, Take 70 mg by mouth once a week., Disp: , Rfl:    aspirin EC 81 MG tablet, Take 81 mg by mouth daily. , Disp: , Rfl:    cholecalciferol (VITAMIN D) 1000 units tablet, Take 1,000 Units by mouth daily., Disp: , Rfl:    diclofenac sodium (VOLTAREN) 1 % GEL, Apply 2 g topically 3 (three) times daily as needed. , Disp: , Rfl:    doxycycline (VIBRA-TABS) 100 MG tablet, Take 1 tablet (100 mg total) by mouth 2 (two) times daily., Disp: 20 tablet, Rfl: 0   doxycycline (VIBRA-TABS) 100 MG tablet, Take 1 tablet (100 mg total) by mouth 2 (two) times daily., Disp: 60 tablet, Rfl: 0   etodolac (LODINE) 500 MG tablet, Take 500 mg by mouth daily as needed., Disp: , Rfl:    ferrous sulfate 324 (65 Fe) MG TBEC, Take 1 tablet by mouth daily. , Disp: , Rfl:    folic acid (FOLVITE) 1 MG tablet, Take 1 mg by mouth daily., Disp: , Rfl:    hydroxychloroquine (PLAQUENIL) 200 MG tablet, One tab daily for rheumatoid arthritis, 30 tabs, Disp: , Rfl:    levothyroxine (SYNTHROID) 75 MCG tablet,  TAKE 1 TABLET(75 MCG) BY MOUTH EVERY DAY 30 TO 60 MINUTES BEFORE BREAKFAST ON AN EMPTY STOMACH AND WITH A GLASS OF WATER, Disp: , Rfl:    methotrexate (RHEUMATREX) 2.5 MG tablet, Take 7 tabs (17.5 mg ) weekly, 12 weeks , 84 tabs, Disp: , Rfl:    predniSONE (DELTASONE) 10 MG tablet, Take one tab daily, 30 days, Disp: , Rfl:    vitamin B-12 (CYANOCOBALAMIN) 1000 MCG tablet, Take 1,000 mcg by mouth daily., Disp: , Rfl:   Current Facility-Administered Medications:    betamethasone acetate-betamethasone sodium phosphate (CELESTONE) injection 3 mg, 3 mg, Intramuscular, Once, Evans, Brent M, DPM   betamethasone acetate-betamethasone sodium phosphate (CELESTONE) injection 3 mg, 3 mg, Intramuscular, Once, Edrick Kins, DPM  Social History   Tobacco Use  Smoking Status Never  Smokeless Tobacco Never    No Known Allergies Objective:   Vitals:   11/02/22 1044  BP: (!) 169/75  Pulse: 84   There is no height or weight on file to calculate BMI. Constitutional Well developed. Well nourished.  Vascular Dorsalis pedis pulses palpable bilaterally. Posterior tibial pulses palpable bilaterally. Capillary refill normal to all digits.  No cyanosis or clubbing noted. Pedal hair growth normal.  Neurologic Normal speech. Oriented to person, place, and time. Protective sensation absent  Dermatologic Wound Location: Right fourth toe fat layer exposed granular wound base without erythema Wound Base: Mixed Granular/Fibrotic Peri-wound: Calloused Exudate: Scant/small amount Serosanguinous exudate Wound Measurements: -See above  Orthopedic: No pain to palpation either foot.   Radiographs: None Assessment:   No diagnosis found.  Plan:  Patient was evaluated and treated and all questions answered.  Ulcer right fourth toe ulceration with fat layer exposed no further erythema. -Debridement as below. -Dressed with beta and wet-to-dry dressing, DSD. -Continue off-loading with surgical  shoe. -Doxycycline was dispensed for skin and soft tissue prophylaxis -She will follow-up with the wound care center next week for further management  Procedure: Excisional Debridement of Wound Tool: Sharp chisel blade/tissue nipper Rationale: Removal of non-viable soft tissue from the wound to promote healing.  Anesthesia: none Pre-Debridement Wound Measurements: 0.5 cm x 0.5 cm x 0.2 cm  Post-Debridement Wound Measurements: 0.6 cm x 0.6 cm x 0.2 cm  Type of Debridement: Sharp Excisional Tissue Removed: Non-viable soft tissue Blood loss: Minimal (<50cc) Depth of Debridement: subcutaneous tissue. Technique: Sharp excisional debridement to bleeding, viable wound base.  Wound Progress: Slightly improved but overall about the same. Site healing conversation 7 Dressing: Dry, sterile, compression dressing. Disposition: Patient tolerated procedure well. Patient to return in 1 week for follow-up.  No follow-ups on file.

## 2022-11-06 DIAGNOSIS — M0579 Rheumatoid arthritis with rheumatoid factor of multiple sites without organ or systems involvement: Secondary | ICD-10-CM | POA: Diagnosis not present

## 2022-11-06 DIAGNOSIS — L89311 Pressure ulcer of right buttock, stage 1: Secondary | ICD-10-CM | POA: Diagnosis not present

## 2022-11-08 ENCOUNTER — Encounter: Payer: PPO | Attending: Physician Assistant | Admitting: Physician Assistant

## 2022-11-08 DIAGNOSIS — S300XXA Contusion of lower back and pelvis, initial encounter: Secondary | ICD-10-CM | POA: Insufficient documentation

## 2022-11-08 DIAGNOSIS — L98418 Non-pressure chronic ulcer of buttock with other specified severity: Secondary | ICD-10-CM | POA: Diagnosis not present

## 2022-11-08 DIAGNOSIS — S31819A Unspecified open wound of right buttock, initial encounter: Secondary | ICD-10-CM | POA: Diagnosis not present

## 2022-11-08 DIAGNOSIS — X58XXXA Exposure to other specified factors, initial encounter: Secondary | ICD-10-CM | POA: Diagnosis not present

## 2022-11-09 ENCOUNTER — Ambulatory Visit: Payer: PPO | Admitting: Podiatry
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
## 2022-11-09 NOTE — Progress Notes (Signed)
[name] ([id number]) [phone] [id number] Page 1 of 5 Visit Report for 11/08/2022 Abuse Risk Screen Details Patient Name: Date of Service: [name] ES, [name] 11/08/2022 2:00 PM Medical Record Number: [id number] Patient Account Number: [id number] Date of Birth/Sex: Treating [name]: [name] (82 y.o. [name] Primary Care [name]: [name] Other Clinician: Referring [name]: Treating [name]/Extender: [name] in Treatment: 0 Abuse Risk Screen Items Answer ABUSE RISK SCREEN: Has anyone close to you tried to hurt or harm you recentlyo No Do you feel uncomfortable with anyone in your familyo No Has anyone forced you do things that you didnt want to doo No Electronic Signature(s) Signed: 11/08/2022 4:35:37 PM By: [name], BSN, [name], CWS, [name], BSN Entered By: [name] on 11/08/2022 14:05:38 -------------------------------------------------------------------------------- Activities of Daily Living Details Patient Name: Date of Service: [name] ES, [name] 11/08/2022 2:00 PM Medical Record Number: [id number] Patient Account Number: [id number] Date of Birth/Sex: Treating [name]: [name] (82 y.o. [name] Primary Care [name]: [name] Other Clinician: Referring [name]: Treating Simrat Kendrick/Extender: [name] in Treatment: 0 Activities of Daily Living Items Answer Activities of Daily Living (Please select one for each item) Drive Automobile Completely Able T Medications ake Completely Able Use T elephone Completely Able Care for Appearance Completely Able Use T oilet Completely Able Bath / Shower Completely Able Dress Self Completely Able Feed Self Completely Able Walk Completely Able Get In / Out Bed Completely Able Housework Completely Able [name] ([id number]) [id number] Page 2 of 5 Prepare Meals Completely  Able Handle Money Completely Able Shop for Self Completely Able Electronic Signature(s) Signed: 11/08/2022 4:35:37 PM By: [name], BSN, [name], CWS, [name], BSN Entered By: [name] on 11/08/2022 14:05:53 -------------------------------------------------------------------------------- Education Screening Details Patient Name: Date of Service: [name] ES, [name] 11/08/2022 2:00 PM Medical Record Number: [id number] Patient Account Number: [id number] Date of Birth/Sex: Treating [name]: [birth year] (82 y.o. [name] Primary Care [name]: [name] Other Clinician: Referring [name]: Treating [name]/Extender: [name] in Treatment: 0 Primary Learner Assessed: Patient Learning Preferences/Education Level/Primary Language Learning Preference: Explanation Preferred Language: English Cognitive Barrier Language Barrier: No Translator Needed: No Memory Deficit: No Emotional Barrier: No Cultural/Religious Beliefs Affecting Medical Care: No Physical Barrier Impaired Vision: No Impaired Hearing: No Decreased Hand dexterity: No Knowledge/Comprehension Knowledge Level: High Comprehension Level: High Ability to understand written instructions: High Ability to understand verbal instructions: High Motivation Anxiety Level: Calm Cooperation: Cooperative Education Importance: Acknowledges Need Interest in Health Problems: Asks Questions Perception: Coherent Willingness to Engage in Self-Management High Activities: Readiness to Engage in Self-Management High Activities: Electronic Signature(s) Signed: 11/08/2022 4:35:37 PM By: [name], BSN, [name], CWS, [name], BSN Entered By: [name] on 11/08/2022 14:06:25 [name], [name] ([id number]) [id number] [id number] Page 3 of 5 -------------------------------------------------------------------------------- Fall Risk Assessment Details Patient Name: Date of  Service: [name] ES, [place] TSY L. 11/08/2022 2:00 PM Medical Record Number: [id number] Patient Account Number: [id number] Date of Birth/Sex: Treating [name]: [birth year] (82 y.o. [name] Primary Care [name]: [name] Other Clinician: Referring [name]: Treating [name] in Treatment: 0 Fall Risk Assessment Items Have you had 2 or more falls in the last 12 monthso 0 No Have you had any fall that resulted in injury in the last 12 monthso 0 Yes FALLS RISK SCREEN History of falling -  immediate or within 3 months 0 No Secondary diagnosis (Do you have 2 or more medical diagnoseso) 0 No Ambulatory aid None/bed rest/wheelchair/nurse 0 Yes Crutches/cane/walker 0 No Furniture 0 No Intravenous therapy Access/Saline/Heparin Lock 0 No Gait/Transferring Normal/ bed rest/ wheelchair 0 Yes Weak (short steps with or without shuffle, stooped but able to lift head while walking, may seek 0 No support from furniture) Impaired (short steps with shuffle, may have difficulty arising from chair, head down, impaired 0 No balance) Mental Status Oriented to own ability 0 Yes Electronic Signature(s) Signed: 11/08/2022 4:35:37 PM By: [name], BSN, [name], CWS, [name], BSN Entered By: [name] on 11/08/2022 14:22:41 -------------------------------------------------------------------------------- Foot Assessment Details Patient Name: Date of Service: [name] ES, [name] 11/08/2022 2:00 PM Medical Record Number: [id number] Patient Account Number: [id number] Date of Birth/Sex: Treating [name]: [name] (82 y.o. [name] Primary Care [name]: [name] Other Clinician: Referring [name]: Treating [name]/Extender: [name] in Treatment: 0 Foot Assessment Items Site Locations [place], [place] L ([id number]) [id number] Page 4 of 5 + = Sensation present, - = Sensation absent, C =  Callus, U = Ulcer R = Redness, W = Warmth, M = Maceration, PU = Pre-ulcerative lesion F = Fissure, S = Swelling, D = Dryness Assessment Right: Left: Other Deformity: No No Prior Foot Ulcer: No No Prior Amputation: No No Charcot Joint: No No Ambulatory Status: Ambulatory Without Help Gait: Steady Electronic Signature(s) Signed: 11/08/2022 4:35:37 PM By: [name], BSN, [name], CWS, [name], BSN Entered By: [name] on 11/08/2022 14:07:10 -------------------------------------------------------------------------------- Nutrition Risk Screening Details Patient Name: Date of Service: [name] ES, [name] 11/08/2022 2:00 PM Medical Record Number: [id number] Patient Account Number: [id number] Date of Birth/Sex: Treating [name]: [birth year] [name] (81 y.o. Charolette Forward, Megan Primary Care Liv Rallis: [name] Other Clinician: Referring Damarious Holtsclaw: Treating Alivea Gladson/Extender: [name] in Treatment: 0 Height (in): 68 Weight (lbs): 104 Body Mass Index (BMI): 15.8 Nutrition Risk Screening Items Score Screening NUTRITION RISK SCREEN: I have an illness or condition that made me change the kind and/or amount of food I eat 0 No I eat fewer than two meals per day 0 No I eat few fruits and vegetables, or milk products 0 No I have three or more drinks of beer, liquor or wine almost every day 0 No I have tooth or mouth problems that make it hard for me to eat 0 No I don't always have enough money to buy the food I need 0 No Rogue, Christine L ([id number]) [id number] Page 5 of 5 I eat alone most of the time 0 No I take three or more different prescribed or over-the-counter drugs a day 0 No Without wanting to, I have lost or gained 10 pounds in the last six months 0 No I am not always physically able to shop, cook and/or feed myself 0 No Nutrition Protocols Good Risk Protocol Moderate Risk Protocol 0 Provide education on nutrition High Risk  Proctocol Risk Level: Good Risk Score: 0 Electronic Signature(s) Signed: 11/08/2022 4:35:37 PM By: [name], BSN, [name], CWS, [name], BSN Entered By: [name] on 11/08/2022 14:07:03

## 2022-11-09 NOTE — Progress Notes (Signed)
[name] ([id number].pdf Page 1 of 9 Visit Report for 11/08/2022 Chief Complaint Document Details Patient Name: Date of Service: [name] ES, [name] 11/08/2022 2:00 PM Medical Record Number: [id number] Patient Account Number: [id number] Date of Birth/Sex: Treating RN: 1941-02-20 (82 y.o. [name] Primary Care Provider: [name] Other Clinician: Referring Provider: Treating Provider/Extender: [name] in Treatment: 0 Information Obtained from: Patient Chief Complaint Right gluteal contusion Electronic Signature(s) Signed: 11/08/2022 2:41:09 PM By: Worthy Keeler PA-C Entered By: Worthy Keeler on 11/08/2022 14:41:09 -------------------------------------------------------------------------------- Debridement Details Patient Name: Date of Service: [name] ES, [name] 11/08/2022 2:00 PM Medical Record Number: [id number] Patient Account Number: [id number] Date of Birth/Sex: Treating RN: June 23, 1941 (82 y.o. [name] Primary Care Provider: [name] Other Clinician: Referring Provider: Treating Provider/Extender: [name] in Treatment: 0 Debridement Performed for Assessment: Wound #1 Right Gluteus Performed By: Physician [name]., PA-C Debridement Type: Debridement Level of Consciousness (Pre-procedure): Awake and Alert Pre-procedure Verification/Time Out Yes - 14:55 Taken: Pain Control: Lidocaine T Area Debrided ([name] x W): otal 2.8 (cm) x 2.5 (cm) = 7 (cm) Tissue and other material debrided: Eschar, Slough, Subcutaneous, Slough Level: Skin/Subcutaneous Tissue Debridement Description: Excisional Instrument: Curette, Forceps, Scissors Bleeding: Moderate Hemostasis Achieved: Pressure Response to Treatment: Procedure was tolerated well Level of Consciousness (Post- Awake and Alert procedure): Post Debridement Measurements of Total Wound Breach, [name]  ([id number].pdf Page 2 of 9 Length: (cm) 2.8 Width: (cm) 2.5 Depth: (cm) 0.6 Volume: (cm) 3.299 Character of Wound/Ulcer Post Debridement: Stable Post Procedure Diagnosis Same as Pre-procedure Electronic Signature(s) Signed: 11/08/2022 4:35:37 PM By: Gretta Cool, BSN, RN, CWS, [name] RN, BSN Signed: 11/08/2022 5:06:45 PM By: Worthy Keeler PA-C Entered By: Gretta Cool, BSN, RN, CWS, [name] on 11/08/2022 14:56:22 -------------------------------------------------------------------------------- HPI Details Patient Name: Date of Service: [name] ES, [name] 11/08/2022 2:00 PM Medical Record Number: [id number] Patient Account Number: [id number] Date of Birth/Sex: Treating RN: May 18, 1941 (82 y.o. [name] Primary Care Provider: [name] Other Clinician: Referring Provider: Treating Provider/Extender: [name] in Treatment: 0 History of Present Illness HPI Description: 11-08-2022 upon evaluation today patient presents for initial evaluation here in our clinic concerning an issue which occurred when she had a fall hitting her right gluteal region and this was actually on 09-04-2022. Since that time she has had an area of eschar as well as an area that was deeper that has been present she has had a hard time getting this to close. Subsequently I do believe this was likely a hematoma that occurred as result of contusion and that is why this is probably not healing as quickly as it needs to we also need to get the necrotic tissue off of this wound bed. Patient does not have any major medical problems. She actually is a very healthy 82 year old. Electronic Signature(s) Signed: 11/08/2022 4:27:13 PM By: Worthy Keeler PA-C Entered By: Worthy Keeler on 11/08/2022 16:27:12 -------------------------------------------------------------------------------- Physical Exam Details Patient Name: Date of Service: [name] ES, [name] 11/08/2022 2:00  PM Medical Record Number: [id number] Patient Account Number: [id number] Date of Birth/Sex: Treating RN: 12-Sep-1940 (82 y.o. [name] Primary Care Provider: [name] Other Clinician: Referring Provider: Treating Provider/Extender: [name] in Treatment: 0 [name], [name] ([id number]) [id number] Page 3 of 9 Constitutional patient is hypertensive.. pulse regular and within target range for patient.Marland Kitchen  respirations regular, non-labored and within target range for patient.Marland Kitchen temperature within target range for patient.. Thin and well-hydrated in no acute distress. Eyes conjunctiva clear no eyelid edema noted. pupils equal round and reactive to light and accommodation. Ears, Nose, Mouth, and Throat no gross abnormality of ear auricles or external auditory canals. normal hearing noted during conversation. mucus membranes moist. Respiratory normal breathing without difficulty. Musculoskeletal normal gait and posture. no significant deformity or arthritic changes, no loss or range of motion, no clubbing. Psychiatric this patient is able to make decisions and demonstrates good insight into disease process. Alert and Oriented x 3. pleasant and cooperative. Notes Upon inspection patient's wound bed actually did require debridement actually use a curette as well as scissors and forceps to clear away some of the necrotic debris she tolerated that debridement today without complication and postdebridement the wound bed actually appears to be doing much better which is great news. There does not appear to be any signs of active infection at this time locally nor systemically. Patient is fully ambulatory. Electronic Signature(s) Signed: 11/08/2022 4:27:44 PM By: Worthy Keeler PA-C Entered By: Worthy Keeler on 11/08/2022 16:27:44 -------------------------------------------------------------------------------- Physician Orders  Details Patient Name: Date of Service: [name] ES, [name] 11/08/2022 2:00 PM Medical Record Number: [id number] Patient Account Number: [id number] Date of Birth/Sex: Treating RN: 11-Mar-1941 (82 y.o. 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 [place]: - Monday and Wednesday dressing changes; wound care appointments on Fridays [facility] for wound care. May utilize formulary equivalent dressing for wound treatment orders unless otherwise specified. Home Health Nurse may visit PRN to address patients wound care needs. Scheduled days for dressing changes to be completed; exception, patient has scheduled wound care visit that day. **Please direct any NON-WOUND related issues/requests for orders to patient's Primary Care Physician. **If current dressing causes regression in wound condition, may D/C ordered dressing product/s and apply Normal Saline Moist Dressing daily until next [place] or Other MD appointment. **Notify [facility] of regression in wound condition at [phone]. Bathing/ Shower/ Hygiene May shower; gently cleanse wound with antibacterial soap, rinse and pat dry prior to dressing wounds Anesthetic (Use 'Patient Medications' Section for Anesthetic Order Entry) Gavia, Alandra [name] ([id number].pdf Page 4 of 9 Lidocaine applied to wound bed Off-Loading Turn and reposition every 2 hours Additional Orders / Instructions Follow Nutritious Diet and Increase Protein Intake Wound  Treatment Wound #1 - Gluteus Wound Laterality: Right Prim Dressing: IODOFLEX 0.9% Cadexomer Iodine Pad (Home Health) 3 x Per Week/30 Days ary Discharge Instructions: Apply Iodoflex to wound bed only as directed. Secondary Dressing: (BORDER) Zetuvit Plus SILICONE BORDER Dressing 5x5 (in/in) (Home Health) 3 x Per Week/30 Days Discharge Instructions: Please do not put silicone bordered dressings under wraps. Use non-bordered dressing only. Electronic Signature(s) Signed: 11/08/2022 4:35:37 PM By: Gretta Cool, BSN, RN, CWS, [name] RN, BSN Signed: 11/08/2022 5:06:45 PM By: Worthy Keeler PA-C Entered By: Gretta Cool BSN, RN, CWS, [name] on 11/08/2022 15:01:36 -------------------------------------------------------------------------------- Problem List Details Patient Name: Date of Service: [name] ES, [name] 11/08/2022 2:00 PM Medical Record Number: [id number] Patient Account Number: [id number]  Date of Birth/Sex: Treating RN: August 20, 1941 (82 y.o. 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 11/08/2022 2:00 PM Medical Record Number: [id number] Patient Account Number: [id number] Date of Birth/Sex: Treating RN: Dec 07, 1940 (82 y.o. [name] Primary Care Provider: [name] Other Clinician: Referring Provider: Treating Provider/Extender: [name] in Treatment: 0 Subjective Chief Complaint Information obtained from Patient Right gluteal contusion History of Present Illness (HPI) 11-08-2022 upon evaluation today patient presents for initial evaluation here in our clinic concerning an issue which occurred when she had a fall hitting her right gluteal region and this was actually on 09-04-2022. Since that time she has had an area of eschar as well as an area that was deeper that has been present she has had a hard time getting this to close. Subsequently I do believe this was likely a hematoma that occurred as result of contusion and that is why this is probably not healing as quickly as it needs to we also need to get the necrotic tissue off of this wound bed. Patient does not have any major medical problems. She actually is a very healthy 82 year old. Patient History Allergies No Known Drug Allergies Social History Never smoker, Marital Status - Single, Alcohol Use - Never, Drug Use - No History, Caffeine Use - Daily. Medical History Eyes Denies history of Cataracts Hematologic/Lymphatic Patient has history of Anemia Integumentary (Skin) Patient has history of History of pressure wounds Musculoskeletal Patient has history of Rheumatoid Arthritis, Osteoarthritis Review of Systems (ROS) Eyes Complains or has symptoms of Glasses / Contacts. Ear/Nose/Mouth/Throat Denies complaints or symptoms of Difficult clearing ears, Sinusitis. Hematologic/Lymphatic Denies complaints or symptoms of Bleeding / Clotting Disorders, Human Immunodeficiency Virus. Respiratory Denies complaints or symptoms of Chronic or frequent coughs, Shortness of Breath. Cardiovascular Denies complaints or symptoms of Chest pain, LE edema. Gastrointestinal Denies complaints or symptoms of Frequent diarrhea, Nausea,  Vomiting. Endocrine Denies complaints or symptoms of Hepatitis, Thyroid disease, Polydypsia (Excessive Thirst). Genitourinary Denies complaints or symptoms of Kidney failure/ Dialysis, Incontinence/dribbling. Immunological Denies complaints or symptoms of Hives, Itching. Integumentary (Skin) Complains or has symptoms of Wounds. Neurologic Denies complaints or symptoms of Numbness/parasthesias, Focal/Weakness. Psychiatric Denies complaints or symptoms of Anxiety, Claustrophobia. [name], [name] ([id number] Page 6 of 9 Objective Constitutional patient is hypertensive.. pulse regular and within target range for patient.Marland Kitchen respirations regular, non-labored and within target range for patient.Marland Kitchen temperature within target range for patient.. Thin and well-hydrated in no acute distress. Vitals Time Taken: 1:58 PM, Height: 68 in, Weight: 104 lbs, BMI: 15.8, Temperature: 97.8 F, Pulse: 84 bpm, Respiratory Rate: 16 breaths/min, Blood Pressure: 169/75 mmHg. Eyes conjunctiva clear no eyelid edema noted. pupils equal round and reactive to light and accommodation. Ears, Nose, Mouth, and Throat no gross abnormality of ear auricles or external auditory canals. normal hearing noted during conversation. mucus membranes moist. Respiratory normal breathing without difficulty. Musculoskeletal normal gait and posture. no significant  deformity or arthritic changes, no loss or range of motion, no clubbing. Psychiatric this patient is able to make decisions and demonstrates good insight into disease process. Alert and Oriented x 3. pleasant and cooperative. General Notes: Upon inspection patient's wound bed actually did require debridement actually use a curette as well as scissors and forceps to clear away some of the necrotic debris she tolerated that debridement today without complication and postdebridement the wound bed actually appears to be doing much  better which is great news. There does not appear to be any signs of active infection at this time locally nor systemically. Patient is fully ambulatory. Integumentary (Hair, Skin) Wound #1 status is Open. Original cause of wound was Pressure Injury. The date acquired was: 09/04/2022. The wound is located on the Right Gluteus. The wound measures 2.8cm length x 2.5cm width x 0.3cm depth; 5.498cm^2 area and 1.649cm^3 volume. There is Fat Layer (Subcutaneous Tissue) exposed. Tunneling has been noted at 8:00 with a maximum distance of 1.2cm. Undermining begins at 8:00 and ends at 8:00 with a maximum distance of 0.6cm. There is a medium amount of serous drainage noted. The wound margin is flat and intact. There is no granulation within the wound bed. There is a large (67-100%) amount of necrotic tissue within the wound bed including Eschar and Adherent Slough. 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 There was a Excisional Skin/Subcutaneous Tissue Debridement with a total area of 7 sq cm performed by [name]., PA-C. With the following instrument(s): Curette, Forceps, and Scissors Material removed includes Eschar, Subcutaneous Tissue, and Slough after achieving pain control using Lidocaine. No specimens were taken. A time out was conducted at 14:55, prior to the start of the procedure. A Moderate amount of bleeding was controlled with Pressure. The procedure was tolerated well. Post Debridement Measurements: 2.8cm length x 2.5cm width x 0.6cm depth; 3.299cm^3 volume. Character of Wound/Ulcer Post Debridement is stable. Post procedure Diagnosis Wound #1: Same as Pre-Procedure Plan Follow-up Appointments: Wound #1 Right Gluteus: Return Appointment in 1 week. Home Health: [facility]: - Monday and Wednesday dressing changes; wound care appointments on Fridays [facility] for wound care. May utilize formulary equivalent dressing for wound treatment orders unless otherwise specified. Home Health Nurse may visit PRN to address patientoos wound care needs. Scheduled days for dressing changes to be completed; exception, patient has scheduled wound care visit that day. **Please direct any NON-WOUND related issues/requests for orders to patient's Primary Care Physician. **If current dressing causes regression in wound condition, may D/C ordered dressing product/s and apply Normal Saline Moist Dressing daily until next [place] or Other MD appointment. **Notify [facility] of regression in wound condition at [phone]. Bathing/ Shower/ Hygiene: May shower; gently cleanse wound with antibacterial soap, rinse and pat dry prior to dressing wounds Anesthetic (Use 'Patient Medications' Section for Anesthetic Order Entry): [name], [name] ([id number] Page 7 of 9 Lidocaine applied to wound bed Off-Loading: Turn and reposition every 2 hours Additional Orders / Instructions: Follow Nutritious Diet and Increase Protein Intake WOUND #1: - Gluteus Wound Laterality: Right Prim Dressing: IODOFLEX 0.9% Cadexomer Iodine Pad (Home Health) 3 x Per Week/30 Days ary Discharge Instructions: Apply Iodoflex to wound bed only as directed. Secondary Dressing: (BORDER) Zetuvit Plus SILICONE BORDER Dressing 5x5 (  in/in) (Home Health) 3 x Per Week/30 Days Discharge Instructions: Please do not put silicone bordered dressings under wraps. Use non-bordered dressing only. 1. I am recommend currently that we have the patient continue to utilize a dressing to help clean up the surface of the wound and recommended Iodoflex as the treatment of choice change 3 times per week. 2. Also can recommend that we have the patient at use  a bordered foam dressing over top of this. 3. She does have home health they are establishing care today and subsequently they will be able to continue her treatment ongoing to help get this to closure. We will see patient back for reevaluation in 1 week here in the clinic. If anything worsens or changes patient will contact our office for additional recommendations. Electronic Signature(s) Signed: 11/08/2022 4:28:19 PM By: Worthy Keeler PA-C Entered By: Worthy Keeler on 11/08/2022 16:28:19 -------------------------------------------------------------------------------- ROS/PFSH Details Patient Name: Date of Service: [name] ES, [name] 11/08/2022 2:00 PM Medical Record Number: [id number] Patient Account Number: [id number] Date of Birth/Sex: Treating RN: 10-26-1940 (82 y.o. [name] Primary Care Provider: [name] Other Clinician: Referring Provider: Treating Provider/Extender: [name] in Treatment: 0 Eyes Complaints and Symptoms: Positive for: Glasses / Contacts Medical History: Negative for: Cataracts Ear/Nose/Mouth/Throat Complaints and Symptoms: Negative for: Difficult clearing ears; Sinusitis Hematologic/Lymphatic Complaints and Symptoms: Negative for: Bleeding / Clotting Disorders; Human Immunodeficiency Virus Medical History: Positive for: Anemia Respiratory Complaints and Symptoms: Negative for: Chronic or frequent coughs; Shortness of Breath Cardiovascular Complaints and Symptoms: Negative for: Chest pain; LE edema [name], [name] ([id number]) [id number] Page 8 of 9 Gastrointestinal Complaints and Symptoms: Negative for: Frequent diarrhea; Nausea; Vomiting Endocrine Complaints and Symptoms: Negative for: Hepatitis; Thyroid disease; Polydypsia (Excessive Thirst) Genitourinary Complaints and Symptoms: Negative for: Kidney failure/ Dialysis; Incontinence/dribbling Immunological Complaints and  Symptoms: Negative for: Hives; Itching Integumentary (Skin) Complaints and Symptoms: Positive for: Wounds Medical History: Positive for: History of pressure wounds Neurologic Complaints and Symptoms: Negative for: Numbness/parasthesias; Focal/Weakness Psychiatric Complaints and Symptoms: Negative for: Anxiety; Claustrophobia Musculoskeletal Medical History: Positive for: Rheumatoid Arthritis; Osteoarthritis Oncologic Immunizations Pneumococcal Vaccine: Received Pneumococcal Vaccination: Yes Received Pneumococcal Vaccination On or After 60th Birthday: Yes Implantable Devices None Family and Social History Never smoker; Marital Status - Single; Alcohol Use: Never; Drug Use: No History; Caffeine Use: Daily Electronic Signature(s) Signed: 11/08/2022 4:35:37 PM By: Gretta Cool, BSN, RN, CWS, [name] RN, BSN Signed: 11/08/2022 5:06:45 PM By: Worthy Keeler PA-C Entered By: Gretta Cool BSN, RN, CWS, [name] on 11/08/2022 14:05:33 [name], [name] ([id number]) [id number] Page 9 of 9 -------------------------------------------------------------------------------- SuperBill Details Patient Name: Date of Service: [name], [place] 11/08/2022 Medical Record Number: [id number] Patient Account Number: [id number] Date of Birth/Sex: Treating RN: 06/13/1941 (82 y.o. 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

## 2022-11-15 DIAGNOSIS — M1990 Unspecified osteoarthritis, unspecified site: Secondary | ICD-10-CM | POA: Diagnosis not present

## 2022-11-15 DIAGNOSIS — Z7952 Long term (current) use of systemic steroids: Secondary | ICD-10-CM | POA: Diagnosis not present

## 2022-11-15 DIAGNOSIS — M24541 Contracture, right hand: Secondary | ICD-10-CM | POA: Diagnosis not present

## 2022-11-15 DIAGNOSIS — E785 Hyperlipidemia, unspecified: Secondary | ICD-10-CM | POA: Diagnosis not present

## 2022-11-15 DIAGNOSIS — I890 Lymphedema, not elsewhere classified: Secondary | ICD-10-CM | POA: Diagnosis not present

## 2022-11-15 DIAGNOSIS — Z9181 History of falling: Secondary | ICD-10-CM | POA: Diagnosis not present

## 2022-11-15 DIAGNOSIS — Z9049 Acquired absence of other specified parts of digestive tract: Secondary | ICD-10-CM | POA: Diagnosis not present

## 2022-11-15 DIAGNOSIS — Z96643 Presence of artificial hip joint, bilateral: Secondary | ICD-10-CM | POA: Diagnosis not present

## 2022-11-15 DIAGNOSIS — G8929 Other chronic pain: Secondary | ICD-10-CM | POA: Diagnosis not present

## 2022-11-15 DIAGNOSIS — Z5982 Transportation insecurity: Secondary | ICD-10-CM | POA: Diagnosis not present

## 2022-11-15 DIAGNOSIS — L89313 Pressure ulcer of right buttock, stage 3: Secondary | ICD-10-CM | POA: Diagnosis not present

## 2022-11-15 DIAGNOSIS — Z602 Problems related to living alone: Secondary | ICD-10-CM | POA: Diagnosis not present

## 2022-11-15 DIAGNOSIS — Z604 Social exclusion and rejection: Secondary | ICD-10-CM | POA: Diagnosis not present

## 2022-11-15 DIAGNOSIS — Z7982 Long term (current) use of aspirin: Secondary | ICD-10-CM | POA: Diagnosis not present

## 2022-11-15 DIAGNOSIS — M069 Rheumatoid arthritis, unspecified: Secondary | ICD-10-CM | POA: Diagnosis not present

## 2022-11-16 ENCOUNTER — Encounter: Payer: PPO | Admitting: Physician Assistant

## 2022-11-16 DIAGNOSIS — L98418 Non-pressure chronic ulcer of buttock with other specified severity: Secondary | ICD-10-CM | POA: Diagnosis not present

## 2022-11-16 DIAGNOSIS — S300XXA Contusion of lower back and pelvis, initial encounter: Secondary | ICD-10-CM | POA: Diagnosis not present

## 2022-11-16 NOTE — Progress Notes (Addendum)
[name] ([id number]) [id number] Page 1 of 6 Visit Report for 11/16/2022 Chief Complaint Document Details Patient Name: Date of Service: [name] [name], [place] [name] 11/16/2022 10:00 A M Medical Record Number: [id number] Patient Account Number: [id number] Date of Birth/Sex: Treating RN: 06/13/1941 (82 y.o. [name] Primary Care Provider: [name] Other Clinician: Referring Provider: Treating Provider/Extender: Vassie Moment in Treatment: 1 Information Obtained from: Patient Chief Complaint Right gluteal contusion Electronic Signature(s) Signed: 11/16/2022 12:26:33 PM By: Rosalio Loud MSN RN CNS WTA Signed: 11/16/2022 2:14:36 PM By: Worthy Keeler PA-C Previous Signature: 11/16/2022 9:55:49 AM Version By: Worthy Keeler PA-C Entered By: Rosalio Loud on 11/16/2022 10:37:18 -------------------------------------------------------------------------------- Debridement Details Patient Name: Date of Service: [name] ES, [name] 11/16/2022 10:00 A M Medical Record Number: [id number] Patient Account Number: [id number] Date of Birth/Sex: Treating RN: 07-Jan-1941 (82 y.o. [name] Primary Care Provider: [name] Other Clinician: Referring Provider: Treating Provider/Extender: Vassie Moment in Treatment: 1 Debridement Performed for Assessment: Wound #1 Right Gluteus Performed By: Physician Megan Sams., PA-C Debridement Type: Debridement Level of Consciousness (Pre-procedure): Awake and Alert Pre-procedure Verification/Time Out Yes - 10:30 Taken: Start Time: 10:30 T Area Debrided ([name] x W): otal 1.8 (cm) x 2 (cm) = 3.6 (cm) Tissue and other material debrided: Viable, Non-Viable, Slough, Subcutaneous, Slough Level: Skin/Subcutaneous Tissue Debridement Description: Excisional Instrument: Curette Bleeding: Minimum Hemostasis Achieved: Pressure Response to Treatment: Procedure was tolerated  well Level of Consciousness (Post- Awake and Alert procedure): Post Debridement Measurements of Total Wound Length: (cm) 1.8 Width: (cm) 2 Depth: (cm) 0.3 Volume: (cm) 0.848 Character of Wound/Ulcer Post Debridement: Stable Post Procedure Diagnosis Same as Pre-procedure Electronic Signature(s) Signed: 11/16/2022 12:26:33 PM By: Rosalio Loud MSN RN CNS WTA Signed: 11/16/2022 2:14:36 PM By: Worthy Keeler PA-C Entered By: Rosalio Loud on 11/16/2022 10:33:41 Curto, [name] ([id number]) [id number] Page 2 of 6 -------------------------------------------------------------------------------- HPI Details Patient Name: Date of Service: [name] [name], [place] [name] 11/16/2022 10:00 A M Medical Record Number: [id number] Patient Account Number: [id number] Date of Birth/Sex: Treating RN: March 18, 1941 (82 y.o. [name] Primary Care Provider: [name] Other Clinician: Referring Provider: Treating Provider/Extender: Vassie Moment in Treatment: 1 History of Present Illness HPI Description: 11-08-2022 upon evaluation today patient presents for initial evaluation here in our clinic concerning an issue which occurred when she had a fall hitting her right gluteal region and this was actually on 09-04-2022. Since that time she has had an area of eschar as well as an area that was deeper that has been present she has had a hard time getting this to close. Subsequently I do believe this was likely a hematoma that occurred as result of contusion and that is why this is probably not healing as quickly as it needs to we also need to get the necrotic tissue off of this wound bed. Patient does not have any major medical problems. She actually is a very healthy 82 year old. 11-16-2022 upon evaluation today patient appears to be doing better in regard to her wound although a lot of the necrotic tissue started to loosen up more we got have to definitely perform some  debridement today. Fortunately I do not see any evidence of infection locally nor systemically which is great news. Electronic Signature(s) Signed: 11/16/2022 10:37:21 AM By: Worthy Keeler PA-C Entered By: Worthy Keeler on 11/16/2022 10:37:21 -------------------------------------------------------------------------------- Physical Exam Details Patient Name: Date of Service: Megan Harris  V ES, [name] 11/16/2022 10:00 A M Medical Record Number: [id number] Patient Account Number: [id number] Date of Birth/Sex: Treating RN: Sep 23, 1940 (82 y.o. [name] Primary Care Provider: Dorothey Baseman Other Clinician: Referring Provider: Treating Provider/Extender: Jannifer Rodney in Treatment: 1 Constitutional Well-nourished and well-hydrated in no acute distress. Respiratory normal breathing without difficulty. Psychiatric this patient is able to make decisions and demonstrates good insight into disease process. Alert and Oriented x 3. pleasant and cooperative. Notes Upon inspection patient's wound bed did require sharp debridement I performed debridement to clearway the necrotic debris as well as slough and biofilm and eschar down to good subcutaneous tissue she tolerated this without complication and postdebridement the wound bed is much improved. Still the area below that I was concerned would track into the main part of the wound is indeed doing so and it is not quite connecting yet but it is very close in the end I think this is can open up and to 1 solid wound which will actually be much easier to address to be honest. Electronic Signature(s) Signed: 11/16/2022 10:37:50 AM By: Lenda Kelp PA-C Entered By: Lenda Kelp on 11/16/2022 10:37:50 -------------------------------------------------------------------------------- Physician Orders Details Patient Name: Date of Service: Megan [name], [name] 11/16/2022 10:00 A M Medical Record Number: [id number] Patient Account  Number: [id number] Date of Birth/Sex: Treating RN: 1941/02/09 (82 y.o. 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 Home Health [facility]: - Monday and Wednesday dressing changes; wound care appointments on Fridays [facility] [phone] [facility] for wound care. May utilize formulary equivalent dressing for wound treatment orders unless otherwise specified. Home Health Nurse may visit PRN to address patients wound care needs. Scheduled days for dressing changes to be completed; exception, patient has scheduled wound care visit that day. **Please direct any NON-WOUND related issues/requests for orders to patient's Primary Care Physician. **If current dressing causes regression in wound condition, may D/C ordered dressing product/s and apply Normal Saline Moist Dressing daily until next Wound Healing Center or Other MD appointment. **Notify Wound Healing Center of regression in wound condition at [phone]. Bathing/ Shower/ Hygiene May shower; gently cleanse wound with antibacterial soap, rinse and pat dry prior to dressing wounds Anesthetic (Use 'Patient Medications' Section for Anesthetic Order Entry) Lidocaine applied to wound bed Off-Loading Turn and reposition every 2 hours Additional Orders / Instructions Follow Nutritious Diet and Increase Protein Intake Wound Treatment Wound #1 - Gluteus Wound Laterality: Right Prim Dressing: IODOFLEX 0.9% Cadexomer Iodine Pad  (Home Health) 3 x Per Week/30 Days ary Discharge Instructions: Apply Iodoflex to wound bed only as directed. Secondary Dressing: (BORDER) Zetuvit Plus SILICONE BORDER Dressing 5x5 (in/in) (Home Health) 3 x Per Week/30 Days Discharge Instructions: Please do not put silicone bordered dressings under wraps. Use non-bordered dressing only. Electronic Signature(s) Signed: 11/20/2022 4:29:20 PM By: Midge Aver MSN RN CNS WTA Signed: 11/23/2022 1:35:38 PM By: Lenda Kelp PA-C Previous Signature: 11/16/2022 12:26:33 PM Version By: Midge Aver MSN RN CNS WTA Previous Signature: 11/16/2022 2:14:36 PM Version By: Lenda Kelp PA-C Entered By: Midge Aver  on 11/20/2022 10:56:01 -------------------------------------------------------------------------------- Problem List Details Patient Name: Date of Service: Silvano Rusk [name], [place] [name] 11/16/2022 10:00 A M Medical Record Number: [id number] Patient Account Number: [id number] Date of Birth/Sex: Treating RN: August 18, 1941 (82 y.o. 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 11/16/2022 10:00 A M Medical Record Number: [id number] Patient Account Number: [id number] Date of Birth/Sex: Treating RN: August 13, 1941 (82 y.o. [name] Primary Care Provider: Dorothey Baseman Other Clinician: Referring Provider: Treating Provider/Extender: Jannifer Rodney in Treatment: 1 Subjective Chief Complaint Information obtained from Patient Right gluteal contusion History of Present Illness (HPI) 11-08-2022 upon evaluation today patient presents for initial evaluation here in our clinic concerning an issue which occurred when she had a fall hitting her right gluteal region and this was actually on 09-04-2022. Since that time she has had an area of eschar as well as an area that was deeper that has been present she has had a hard time getting this to close. Subsequently I do believe this was likely a hematoma that occurred as result of contusion and that is why this is probably not healing as quickly as it needs to we also need to get the necrotic tissue off of this wound bed. Patient does not have any major medical problems. She actually is a very healthy 82 year old. 11-16-2022 upon evaluation today patient appears to be doing better in regard to her wound although a lot of the necrotic tissue started to loosen up more we got have to definitely perform some debridement today. Fortunately I do not see any evidence of infection locally nor systemically which is great news. Objective Constitutional Well-nourished and well-hydrated in no acute distress. Vitals Time Taken: 9:57 AM, Height: 68 in, Weight: 104 lbs, BMI: 15.8, Temperature: 98.1 F, Pulse: 75 bpm, Respiratory Rate: 16 breaths/min, Blood Pressure: 140/64 mmHg. Respiratory normal breathing without difficulty. Psychiatric this patient is able to make decisions and demonstrates good insight into disease process. Alert and Oriented x 3. pleasant and cooperative. General Notes: Upon  inspection patient's wound bed did require sharp debridement I performed debridement to clearway the necrotic debris as well as slough and biofilm and eschar down to good subcutaneous tissue she tolerated this without complication and postdebridement the wound bed is much improved. Still the area below that I was concerned would track into the main part of the wound is indeed doing so and it is not quite connecting yet but it is very close in the end I think this is can open up and to 1 solid wound which will actually be much easier to address to be honest. Integumentary (Hair, Skin) Wound #1 status is Open. Original cause of wound was Pressure Injury. The date acquired was: 09/04/2022. The wound has been in treatment 1 weeks. The wound is located on the  Right Gluteus. The wound measures 1.8cm length x 2cm width x 0.1cm depth; 2.827cm^2 area and 0.283cm^3 volume. There is Fat Layer (Subcutaneous Tissue) exposed. There is undermining starting at 8:00 and ending at 7:00 with a maximum distance of 1.8cm. There is a medium amount of serous drainage noted. The wound margin is flat and intact. There is no granulation within the wound bed. There is a large (67-100%) amount of necrotic tissue within the wound bed including Eschar and Adherent Slough. 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 There was a Excisional Skin/Subcutaneous Tissue Debridement with a total area of 3.6 sq cm performed by Nelida MeuseStone, Deazia Lampi E., PA-C. With the following instrument(s): Curette to remove Viable and Non-Viable tissue/material. Material removed includes Subcutaneous Tissue and Slough and. No specimens were taken. A time out was  conducted at 10:30, prior to the start of the procedure. A Minimum amount of bleeding was controlled with Pressure. The procedure was tolerated well. Post Debridement Measurements: 1.8cm length x 2cm width x 0.3cm depth; 0.848cm^3 volume. Character of Wound/Ulcer Post Debridement is stable. Post procedure Diagnosis Wound #1: Same as Pre-Procedure Plan Follow-up Appointments: Wound #1 Right Gluteus: Return Appointment in 1 week. Home Health: Home Health Company: - Monday and Wednesday dressing changes; [facility]. May utilize formulary equivalent dressing for wound treatment orders unless otherwise specified. Home Health Nurse may visit PRN to address patientoos wound care needs. Scheduled days for dressing changes to be completed; exception, patient has scheduled wound care visit that day. **Please direct any NON-WOUND related issues/requests for orders to patient's Primary Care Physician. **If current dressing causes regression in wound condition, may D/C ordered dressing product/s and apply Normal Saline Moist Dressing daily until next Wound Healing Center or Other MD appointment. **Notify Wound Healing Center of regression in wound condition at [phone]. Bathing/ Shower/ Hygiene: May shower; gently cleanse wound with antibacterial soap, rinse and pat dry prior to dressing wounds Anesthetic (Use 'Patient Medications' Section for Anesthetic Order Entry): Lidocaine applied to wound bed Off-Loading: Turn and reposition every 2 hours Additional Orders / Instructions: Follow Nutritious Diet and Increase Protein Intake WOUND #1: - Gluteus Wound Laterality: Right Prim Dressing: IODOFLEX 0.9% Cadexomer Iodine Pad (Home Health) 3 x Per Week/30 Days ary Discharge Instructions: Apply Iodoflex to wound bed only as directed. Secondary Dressing: (BORDER) Zetuvit Plus SILICONE BORDER Dressing 5x5 (in/in) (Home Health) 3 x Per Week/30  Days Discharge Instructions: Please do not put silicone bordered dressings under wraps. Use non-bordered dressing only. 1. I am good recommend that we have the patient continue to monitor for any signs of worsening or infection based on what I am seeing right now I do believe that the patient is making good progress. 2. Also can recommend the patient should continue to monitor for any signs of increased pain this could increase her chances of having an infection otherwise we will continue with the Iodoflex and appropriate offloading. We will see patient back for reevaluation in 1 week here in the clinic. If anything worsens or changes patient will contact our office for additional recommendations. Electronic Signature(s) Signed: 11/16/2022 10:38:20 AM By: Lenda KelpStone III, Rutilio Yellowhair PA-C Entered By: Lenda KelpStone III, Samanthajo Payano on 11/16/2022 10:38:20 -------------------------------------------------------------------------------- SuperBill Details Patient Name:  Date of Service: [name], [place] 11/16/2022 Medical Record Number: [id number] Patient Account Number: [id number] Date of Birth/Sex: Treating RN: 11-22-1940 (82 y.o. 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

## 2022-11-16 NOTE — Progress Notes (Addendum)
KAELYNNE, VOID ([id number]) [id number] Page 1 of 8 Visit Report for 11/16/2022 Arrival Information Details Patient Name: Date of Service: [name], [place] [name] 11/16/2022 10:00 A M Medical Record Number: [id number] Patient Account Number: [id number] Date of Birth/Sex: Treating RN: 17-Jul-1941 (82 y.o. [name]: [name] Other Clinician: Referring [name]: Treating [name]/Extender: [name] in Treatment: 1 Visit Information History Since Last Visit Added or deleted any medications: No Patient Arrived: Ambulatory Has Dressing in Place as Prescribed: Yes Arrival Time: 09:57 Pain Present Now: No Accompanied By: self Transfer Assistance: None Patient Identification Verified: Yes Secondary Verification Process Completed: Yes Patient Requires Transmission-Based Precautions: No Patient Has Alerts: No Electronic Signature(s) Signed: 11/20/2022 10:56:06 AM By: Megan Loud MSN RN CNS WTA Previous Signature: 11/16/2022 12:26:33 PM Version By: Megan Loud MSN RN CNS WTA Entered By: [name] on 11/20/2022 10:56:06 -------------------------------------------------------------------------------- Clinic Level of Care Assessment Details Patient Name: Date of Service: Megan Server, [name] 11/16/2022 10:00 A M Medical Record Number: [id number] Patient Account Number: [id number] Date of Birth/Sex: Treating RN: [birth year] (82 y.o. [name]: [name] Other Clinician: Referring [name]: Treating [name]/Extender: [name] in Treatment: 1 Clinic Level of Care Assessment Items TOOL 1 Quantity Score []  - 0 Use when EandM and Procedure is performed on INITIAL visit ASSESSMENTS - Nursing Assessment / Reassessment []  - 0 General Physical Exam (combine w/ comprehensive assessment (listed just below) when performed on new pt. evals) []  -  0 Comprehensive Assessment (HX, ROS, Risk Assessments, Wounds Hx, etc.) ASSESSMENTS - Wound and Skin Assessment / Reassessment []  - 0 Dermatologic / Skin Assessment (not related to wound area) ASSESSMENTS - Ostomy and/or Continence Assessment and Care []  - 0 Incontinence Assessment and Management []  - 0 Ostomy Care Assessment and Management (repouching, etc.) PROCESS - Coordination of Care []  - 0 Simple Patient / Family Education for ongoing care []  - 0 Complex (extensive) Patient / Family Education for ongoing care []  - 0 Staff obtains Programmer, systems, Records, T Results / Process Orders est []  - 0 Staff telephones HHA, Nursing Homes / Clarify orders / etc []  - 0 Routine Transfer to another Facility (non-emergent condition) []  - 0 Routine Hospital Admission (non-emergent condition) []  - 0 New Admissions / Insurance Authorizations / Ordering NPWT Apligraf, etc. , []  - 0 Emergency Hospital Admission (emergent condition) PROCESS - Special Needs [name] ([id number]) [id number] Page 2 of 8 []  - 0 Pediatric / Minor Patient Management []  - 0 Isolation Patient Management []  - 0 Hearing / Language / Visual special needs []  - 0 Assessment of Community assistance (transportation, D/C planning, etc.) []  - 0 Additional assistance / Altered mentation []  - 0 Support Surface(s) Assessment (bed, cushion, seat, etc.) INTERVENTIONS - Miscellaneous []  - 0 External ear exam []  - 0 Patient Transfer (multiple staff / Civil Service fast streamer / Similar devices) []  - 0 Simple Staple / Suture removal (25 or less) []  - 0 Complex Staple / Suture removal (26 or more) []  - 0 Hypo/Hyperglycemic Management (do not check if billed separately) []  - 0 Ankle / Brachial Index (ABI) - do not check if billed separately Has the patient been seen at the hospital within the last three years: Yes Total Score: 0 Level Of Care: ____ Electronic Signature(s) Signed: 11/16/2022 12:26:33 PM  By: Megan Loud MSN RN CNS WTA Entered By: [name] on 11/16/2022 10:36:03 -------------------------------------------------------------------------------- Encounter Discharge Information Details Patient Name:  Date of Service: [name], [place] [name] 11/16/2022 10:00 A M Medical Record Number: [id number] Patient Account Number: [id number] Date of Birth/Sex: Treating RN: 1941/04/29 (82 y.o. [name]: [name] Other Clinician: Referring [name]: Treating [name]/Extender: [name] in Treatment: 1 Encounter Discharge Information Items Post Procedure Vitals Discharge Condition: Stable Temperature (F): 98.1 Ambulatory Status: Ambulatory Pulse (bpm): 75 Discharge Destination: Home Respiratory Rate (breaths/min): 16 Transportation: Private Auto Blood Pressure (mmHg): 140/64 Accompanied By: self Schedule Follow-up Appointment: Yes Clinical Summary of Care: Electronic Signature(s) Signed: 11/20/2022 10:56:50 AM By: Megan Loud MSN RN CNS WTA Previous Signature: 11/16/2022 12:26:33 PM Version By: Megan Loud MSN RN CNS WTA Entered By: [name] on 11/20/2022 10:56:49 -------------------------------------------------------------------------------- Lower Extremity Assessment Details Patient Name: Date of Service: Megan Server, [name] 11/16/2022 10:00 A M Medical Record Number: [id number] Patient Account Number: [id number] Date of Birth/Sex: Treating RN: May 03, 1941 (82 y.o. [name]: [name] Other Clinician: Referring [name]: Treating [name]/Extender: [name] in Treatment: 1 Electronic Signature(s) Signed: 11/16/2022 12:26:33 PM By: Megan Loud MSN RN CNS WTA Entered By: [name] on 11/16/2022 10:32:08 [name], [name] ([id number]) [id number] Page 3 of  8 -------------------------------------------------------------------------------- Multi Wound Chart Details Patient Name: Date of Service: [name], [place] [name] 11/16/2022 10:00 A M Medical Record Number: [id number] Patient Account Number: [id number] Date of Birth/Sex: Treating RN: 04/19/1941 (82 y.o. [name] Primary Care [name]: [name] Other Clinician: Referring [name]: Treating Megan Usman/Extender: [name] in Treatment: 1 Vital Signs Height(in): 68 Pulse(bpm): 75 Weight(lbs): 104 Blood Pressure(mmHg): 140/64 Body Mass Index(BMI): 15.8 Temperature(F): 98.1 Respiratory Rate(breaths/min): 16 [1:Photos:] [N/A:N/A] Right Gluteus N/A N/A Wound Location: Pressure Injury N/A N/A Wounding Event: Trauma, Other N/A N/A Primary Etiology: Anemia, History of pressure wounds, N/A N/A Comorbid History: Rheumatoid Arthritis, Osteoarthritis 09/04/2022 N/A N/A Date Acquired: 1 N/A N/A Weeks of Treatment: Open N/A N/A Wound Status: No N/A N/A Wound Recurrence: 1.8x2x0.1 N/A N/A Measurements [name] x W x D (cm) 2.827 N/A N/A A (cm) : rea 0.283 N/A N/A Volume (cm) : 48.60% N/A N/A % Reduction in A rea: 82.80% N/A N/A % Reduction in Volume: 8 Starting Position 1 (o'clock): 7 Ending Position 1 (o'clock): 1.8 Maximum Distance 1 (cm): Yes N/A N/A Undermining: Unclassifiable N/A N/A Classification: Medium N/A N/A Exudate A mount: Serous N/A N/A Exudate Type: amber N/A N/A Exudate Color: Flat and Intact N/A N/A Wound Margin: None Present (0%) N/A N/A Granulation A mount: Large (67-100%) N/A N/A Necrotic A mount: Eschar, Adherent Slough N/A N/A Necrotic Tissue: Fat Layer (Subcutaneous Tissue): Yes N/A N/A Exposed Structures: Fascia: No Tendon: No Muscle: No Joint: No Bone: No None N/A N/A Epithelialization: Treatment Notes Electronic Signature(s) Signed: 11/16/2022 12:26:33 PM By: Megan Loud MSN RN CNS WTA Entered  By: [name] on 11/16/2022 10:32:13 -------------------------------------------------------------------------------- Multi-Disciplinary Care Plan Details Patient Name: Date of Service: Megan Feeler ES, [name] 11/16/2022 10:00 A M Medical Record Number: [id number] Patient Account Number: [id number] Date of Birth/Sex: Treating RN: 04-10-1941 (82 y.o. Jackline, [place], [place] ([id number]) [phone].pdf Page 4 of 8 Primary Care Jediah Horger: [name] Other Clinician: Referring Quintasia Theroux: Treating Tiearra Colwell/Extender: [name] in Treatment: 1 Active Inactive Abuse / Safety / Falls / Self Care Management Nursing Diagnoses: Potential for falls Potential for injury related to abuse or neglect Goals: Patient will not experience any injury related to falls  Date Initiated: 11/13/2022 Target Resolution Date: 11/08/2022 Goal Status: Active Patient will remain injury free related to falls Date Initiated: 11/13/2022 Target Resolution Date: 11/08/2022 Goal Status: Active Patient/caregiver will verbalize understanding of skin care regimen Date Initiated: 11/13/2022 Target Resolution Date: 11/08/2022 Goal Status: Active Interventions: Assess self care needs on admission and as needed Provide education on personal and home safety Notes: Necrotic Tissue Nursing Diagnoses: Impaired tissue integrity related to necrotic/devitalized tissue Knowledge deficit related to management of necrotic/devitalized tissue Goals: Necrotic/devitalized tissue will be minimized in the wound bed Date Initiated: 11/13/2022 Target Resolution Date: 11/08/2022 Goal Status: Active Patient/caregiver will verbalize understanding of reason and process for debridement of necrotic tissue Date Initiated: 11/13/2022 Target Resolution Date: 11/08/2022 Goal Status: Active Interventions: Assess patient pain level pre-, during and post procedure and prior to discharge Provide  education on necrotic tissue and debridement process Treatment Activities: Apply topical anesthetic as ordered : 11/08/2022 Excisional debridement : 11/08/2022 Notes: Nutrition Nursing Diagnoses: Imbalanced nutrition Potential for alteratiion in Nutrition/Potential for imbalanced nutrition Goals: Patient/caregiver agrees to and verbalizes understanding of need to use nutritional supplements and/or vitamins as prescribed Date Initiated: 11/13/2022 Target Resolution Date: 11/08/2022 Goal Status: Active Interventions: Provide education on nutrition Treatment Activities: Special diet education : 11/08/2022 Notes: Orientation to the Wound Care Program Nursing Diagnoses: BANNIE, KHAN ([id number]) [id number] Page 5 of 8 Knowledge deficit related to the wound healing center program Goals: Patient/caregiver will verbalize understanding of the [place] Date Initiated: 11/13/2022 Target Resolution Date: 11/08/2022 Goal Status: Active Interventions: Provide education on orientation to the wound center Notes: Pressure Nursing Diagnoses: Knowledge deficit related to causes and risk factors for pressure ulcer development Knowledge deficit related to management of pressures ulcers Potential for impaired tissue integrity related to pressure, friction, moisture, and shear Goals: Patient will remain free from development of additional pressure ulcers Date Initiated: 11/13/2022 Target Resolution Date: 11/08/2022 Goal Status: Active Patient/caregiver will verbalize understanding of pressure ulcer management Date Initiated: 11/13/2022 Target Resolution Date: 11/08/2022 Goal Status: Active Interventions: Assess: immobility, friction, shearing, incontinence upon admission and as needed Assess offloading mechanisms upon admission and as needed Provide education on pressure ulcers Notes: Wound/Skin Impairment Nursing Diagnoses: Impaired tissue  integrity Knowledge deficit related to smoking impact on wound healing Knowledge deficit related to ulceration/compromised skin integrity Goals: Patient/caregiver will verbalize understanding of skin care regimen Date Initiated: 11/13/2022 Target Resolution Date: 11/08/2022 Goal Status: Active Ulcer/skin breakdown will have a volume reduction of 30% by week 4 Date Initiated: 11/13/2022 Target Resolution Date: 12/06/2022 Goal Status: Active Ulcer/skin breakdown will have a volume reduction of 50% by week 8 Date Initiated: 11/13/2022 Target Resolution Date: 01/03/2023 Goal Status: Active Ulcer/skin breakdown will have a volume reduction of 80% by week 12 Date Initiated: 11/13/2022 Target Resolution Date: 01/31/2023 Goal Status: Active Ulcer/skin breakdown will heal within 14 weeks Date Initiated: 11/13/2022 Target Resolution Date: 02/14/2023 Goal Status: Active Interventions: Assess patient/caregiver ability to obtain necessary supplies Assess ulceration(s) every visit Provide education on ulcer and skin care Treatment Activities: Skin care regimen initiated : 11/08/2022 Notes: Electronic Signature(s) Signed: 11/16/2022 1:13:20 PM By: Carlene Coria RN Entered By: Carlene Coria on 11/16/2022 13:13:20 [name], Lakiyah [name] ([id number]) [id number] Page 6 of 8 -------------------------------------------------------------------------------- Pain Assessment Details Patient Name: Date of Service: [name], [place] [name] 11/16/2022 10:00 A M Medical Record Number: [id number] Patient Account Number: [id number] Date of Birth/Sex: Treating RN: September 22, 1940 (82 y.o. [name]: [name] Other Clinician: Referring Denesha Brouse: Treating  Lavaeh Bau/Extender: [name] in Treatment: 1 Active Problems Location of Pain Severity and Description of Pain Patient Has Paino No Site Locations Pain Management and Medication Current Pain  Management: Electronic Signature(s) Signed: 11/16/2022 12:26:33 PM By: Megan Loud MSN RN CNS WTA Entered By: [name] on 11/16/2022 09:59:03 -------------------------------------------------------------------------------- Patient/Caregiver Education Details Patient Name: Date of Service: Megan Server, [name] 3/29/2024andnbsp10:00 A M Medical Record Number: [id number] Patient Account Number: [id number] Date of Birth/Gender: Treating RN: 1941-06-15 (82 y.o. [name] Primary Care Physician: [name] Other Clinician: Referring Physician: Treating Physician/Extender: [name] in Treatment: 1 Education Assessment Education Provided To: Patient Education Topics Provided Wound Debridement: Handouts: Wound Debridement Methods: Explain/Verbal Responses: State content correctly Wound/Skin Impairment: Handouts: Caring for Your Ulcer Methods: Explain/Verbal Responses: State content correctly Electronic Signature(s) Signed: 11/16/2022 12:26:33 PM By: Megan Loud MSN RN CNS Curlene Dolphin, [place] ([id number]) [id number] Page 7 of 8 Entered By: [name] on 11/16/2022 10:37:04 -------------------------------------------------------------------------------- Wound Assessment Details Patient Name: Date of Service: [name], [place] [name] 11/16/2022 10:00 A M Medical Record Number: [id number] Patient Account Number: [id number] Date of Birth/Sex: Treating RN: [birth year] (82 y.o. [name]: [name] Other Clinician: Referring Magdelyn Roebuck: Treating Shelia Kingsberry/Extender: [name] in Treatment: 1 Wound Status Wound Number: 1 Primary Trauma, Other Etiology: Wound Location: Right Gluteus Wound Status: Open Wounding Event: Pressure Injury Comorbid Anemia, History of pressure wounds, Rheumatoid Arthritis, Date Acquired: 09/04/2022 History: Osteoarthritis Weeks Of Treatment:  1 Clustered Wound: No Photos Wound Measurements Length: (cm) 1.8 Width: (cm) 2 Depth: (cm) 0.1 Area: (cm) 2.827 Volume: (cm) 0.283 % Reduction in Area: 48.6% % Reduction in Volume: 82.8% Epithelialization: None Undermining: Yes Starting Position (o'clock): 8 Ending Position (o'clock): 7 Maximum Distance: (cm) 1.8 Wound Description Classification: Unclassifiable Wound Margin: Flat and Intact Exudate Amount: Medium Exudate Type: Serous Exudate Color: amber Foul Odor After Cleansing: No Slough/Fibrino Yes Wound Bed Granulation Amount: None Present (0%) Exposed Structure Necrotic Amount: Large (67-100%) Fascia Exposed: No Necrotic Quality: Eschar, Adherent Slough Fat Layer (Subcutaneous Tissue) Exposed: Yes Tendon Exposed: No Muscle Exposed: No Joint Exposed: No Bone Exposed: No Treatment Notes Wound #1 (Gluteus) Wound Laterality: Right Cleanser Peri-Wound Care Topical Primary Dressing IODOFLEX 0.9% Cadexomer Iodine Pad Discharge Instruction: Apply Iodoflex to wound bed only as directed. LANEISHA, HOLDERBAUM ([id number]) [id number] Page 8 of 8 Secondary Dressing (BORDER) Zetuvit Plus SILICONE BORDER Dressing 5x5 (in/in) Discharge Instruction: Please do not put silicone bordered dressings under wraps. Use non-bordered dressing only. Secured With Compression Wrap Compression Stockings Environmental education officer) Signed: 11/16/2022 12:26:33 PM By: Megan Loud MSN RN CNS WTA Entered By: [name] on 11/16/2022 10:08:27 -------------------------------------------------------------------------------- Vitals Details Patient Name: Date of Service: Megan Feeler ES, [name] 11/16/2022 10:00 A M Medical Record Number: [id number] Patient Account Number: [id number] Date of Birth/Sex: Treating RN: [birth year] (82 y.o. [name]: [name] Other Clinician: Referring Lachelle Rissler: Treating Vidalia Serpas/Extender: [name] in Treatment: 1 Vital Signs Time Taken: 09:57 Temperature (F): 98.1 Height (in): 68 Pulse (bpm): 75 Weight (lbs): 104 Respiratory Rate (breaths/min): 16 Body Mass Index (BMI): 15.8 Blood Pressure (mmHg): 140/64 Reference Range: 80 - 120 mg / dl Electronic Signature(s) Signed: 11/20/2022 10:56:11 AM By: Megan Loud MSN RN CNS WTA Previous Signature: 11/16/2022 12:26:33 PM Version By: Megan Loud MSN RN CNS WTA Entered By: [name] on 11/20/2022 10:56:10

## 2022-11-20 DIAGNOSIS — E785 Hyperlipidemia, unspecified: Secondary | ICD-10-CM | POA: Diagnosis not present

## 2022-11-20 DIAGNOSIS — M069 Rheumatoid arthritis, unspecified: Secondary | ICD-10-CM | POA: Diagnosis not present

## 2022-11-20 DIAGNOSIS — Z604 Social exclusion and rejection: Secondary | ICD-10-CM | POA: Diagnosis not present

## 2022-11-20 DIAGNOSIS — M1990 Unspecified osteoarthritis, unspecified site: Secondary | ICD-10-CM | POA: Diagnosis not present

## 2022-11-20 DIAGNOSIS — Z7952 Long term (current) use of systemic steroids: Secondary | ICD-10-CM | POA: Diagnosis not present

## 2022-11-20 DIAGNOSIS — I890 Lymphedema, not elsewhere classified: Secondary | ICD-10-CM | POA: Diagnosis not present

## 2022-11-20 DIAGNOSIS — Z7982 Long term (current) use of aspirin: Secondary | ICD-10-CM | POA: Diagnosis not present

## 2022-11-20 DIAGNOSIS — L89313 Pressure ulcer of right buttock, stage 3: Secondary | ICD-10-CM | POA: Diagnosis not present

## 2022-11-20 DIAGNOSIS — M24541 Contracture, right hand: Secondary | ICD-10-CM | POA: Diagnosis not present

## 2022-11-20 DIAGNOSIS — Z96643 Presence of artificial hip joint, bilateral: Secondary | ICD-10-CM | POA: Diagnosis not present

## 2022-11-20 DIAGNOSIS — G8929 Other chronic pain: Secondary | ICD-10-CM | POA: Diagnosis not present

## 2022-11-20 DIAGNOSIS — Z5982 Transportation insecurity: Secondary | ICD-10-CM | POA: Diagnosis not present

## 2022-11-20 DIAGNOSIS — Z9181 History of falling: Secondary | ICD-10-CM | POA: Diagnosis not present

## 2022-11-20 DIAGNOSIS — Z9049 Acquired absence of other specified parts of digestive tract: Secondary | ICD-10-CM | POA: Diagnosis not present

## 2022-11-23 ENCOUNTER — Encounter: Payer: PPO | Attending: Physician Assistant | Admitting: Physician Assistant

## 2022-11-23 DIAGNOSIS — L98418 Non-pressure chronic ulcer of buttock with other specified severity: Secondary | ICD-10-CM | POA: Diagnosis not present

## 2022-11-23 DIAGNOSIS — X58XXXA Exposure to other specified factors, initial encounter: Secondary | ICD-10-CM | POA: Diagnosis not present

## 2022-11-23 DIAGNOSIS — L98412 Non-pressure chronic ulcer of buttock with fat layer exposed: Secondary | ICD-10-CM | POA: Diagnosis not present

## 2022-11-23 DIAGNOSIS — W19XXXA Unspecified fall, initial encounter: Secondary | ICD-10-CM | POA: Diagnosis not present

## 2022-11-23 DIAGNOSIS — S300XXA Contusion of lower back and pelvis, initial encounter: Secondary | ICD-10-CM | POA: Insufficient documentation

## 2022-11-23 NOTE — Progress Notes (Addendum)
Megan, [name] ([id number]) [id number] Page 1 of 6 Visit Report for 11/23/2022 Chief Complaint Document Details Patient Name: Date of Service: [name] ES, [name] 11/23/2022 9:30 A M Medical Record Number: [id number] Patient Account Number: [id number] Date of Birth/Sex: Treating RN: Feb 16, 1941 (82 y.o. Skip Mayer Primary Care Provider: Dorothey Baseman Other Clinician: Betha Loa Referring Provider: Treating Provider/Extender: Jannifer Rodney in Treatment: 2 Information Obtained from: Patient Chief Complaint Right gluteal contusion Electronic Signature(s) Signed: 11/23/2022 9:17:59 AM By: Lenda Kelp PA-C Entered By: Lenda Kelp on 11/23/2022 09:17:58 -------------------------------------------------------------------------------- Debridement Details Patient Name: Date of Service: Megan [name], [name] 11/23/2022 9:30 A M Medical Record Number: [id number] Patient Account Number: [id number] Date of Birth/Sex: Treating RN: 03-24-1941 (82 y.o. Skip Mayer Primary Care Provider: Dorothey Baseman Other Clinician: Betha Loa Referring Provider: Treating Provider/Extender: Jannifer Rodney in Treatment: 2 Debridement Performed for Assessment: Wound #1 Right Gluteus Performed By: Physician Nelida Meuse., PA-C Debridement Type: Debridement Level of Consciousness (Pre-procedure): Awake and Alert Pre-procedure Verification/Time Out Yes - 10:30 Taken: Start Time: 10:30 T Area Debrided (L x W): otal 1 (cm) x 1 (cm) = 1 (cm) Tissue and other material debrided: Viable, Non-Viable, Slough, Subcutaneous, Skin: Dermis , Skin: Epidermis, Slough Level: Skin/Subcutaneous Tissue Debridement Description: Excisional Instrument: Forceps, Scissors Bleeding: Moderate Hemostasis Achieved: Silver Nitrate Response to Treatment: Procedure was tolerated well Level of Consciousness (Post- Awake and Alert procedure): Post  Debridement Measurements of Total Wound Length: (cm) 2 Width: (cm) 2.8 Depth: (cm) 1.2 Volume: (cm) 5.278 Character of Wound/Ulcer Post Debridement: Stable Post Procedure Diagnosis Same as Pre-procedure Electronic Signature(s) Signed: 11/23/2022 11:43:38 AM By: Betha Loa Signed: 11/23/2022 12:23:32 PM By: Lenda Kelp PA-C Signed: 11/23/2022 2:01:12 PM By: Elliot Gurney, BSN, RN, CWS, [name] RN, BSN Entered By: Betha Loa on 11/23/2022 10:57:45 Uecker, Shonette L ([id number]) [id number] Page 2 of 6 -------------------------------------------------------------------------------- HPI Details Patient Name: Date of Service: [name] ES, [name] 11/23/2022 9:30 A M Medical Record Number: [id number] Patient Account Number: [id number] Date of Birth/Sex: Treating RN: Mar 21, 1941 (82 y.o. Skip Mayer Primary Care Provider: Dorothey Baseman Other Clinician: Betha Loa Referring Provider: Treating Provider/Extender: Jannifer Rodney in Treatment: 2 History of Present Illness HPI Description: 11-08-2022 upon evaluation today patient presents for initial evaluation here in our clinic concerning an issue which occurred when she had a fall hitting her right gluteal region and this was actually on 09-04-2022. Since that time she has had an area of eschar as well as an area that was deeper that has been present she has had a hard time getting this to close. Subsequently I do believe this was likely a hematoma that occurred as result of contusion and that is why this is probably not healing as quickly as it needs to we also need to get the necrotic tissue off of this wound bed. Patient does not have any major medical problems. She actually is a very healthy 81 year old. 11-16-2022 upon evaluation today patient appears to be doing better in regard to her wound although a lot of the necrotic tissue started to loosen up more we got have to definitely perform some  debridement today. Fortunately I do not see any evidence of infection locally nor systemically which is great news. 11-23-2022 upon evaluation today patient appears to be doing poorly currently in regard to her wound which actually is a bit deeper. Nonetheless I think that this is something  that is showing signs of the need for some sharp debridement she has an area of skin connecting where we cannot properly packed the wound we need to remove this and I discussed that with her today. I am going to need to numb her for this we will use lidocaine in order to do that so that make sure we do not cause any pain or problems. Fortunately I do not see any signs of active infection locally nor systemically at this time which is great news. Electronic Signature(s) Signed: 11/23/2022 10:17:16 AM By: Lenda Kelp PA-C Entered By: Lenda Kelp on 11/23/2022 10:17:16 -------------------------------------------------------------------------------- Physical Exam Details Patient Name: Date of Service: Megan Wynell Balloon, [name] 11/23/2022 9:30 A M Medical Record Number: [id number] Patient Account Number: [id number] Date of Birth/Sex: Treating RN: 01-31-1941 (82 y.o. Skip Mayer Primary Care Provider: Dorothey Baseman Other Clinician: Betha Loa Referring Provider: Treating Provider/Extender: Jannifer Rodney in Treatment: 2 Constitutional Well-nourished and well-hydrated in no acute distress. Respiratory normal breathing without difficulty. Psychiatric this patient is able to make decisions and demonstrates good insight into disease process. Alert and Oriented x 3. pleasant and cooperative. Notes Upon inspection patient's wound bed actually does have still some necrotic debris we need to try to see about getting the wound improved as far as the overall necrotic tissue is concerned and I do think that she will benefit from removing this area skin connecting between the 2 openings of the  wound that connect underneath to 1. This would allow [place] to more appropriately pack which I think would do a much better job for her. I think we need to switch to [facility]. I did perform the procedure today without complication there was bleeding but postprocedure I was able to achieve hemostasis limited silver nitrate use. Electronic Signature(s) Signed: 11/23/2022 10:18:02 AM By: Lenda Kelp PA-C Entered By: Lenda Kelp on 11/23/2022 10:18:01 -------------------------------------------------------------------------------- Physician Orders Details Patient Name: Date of Service: [name] ES, [name] 11/23/2022 9:30 A M Medical Record Number: [id number] Patient Account Number: [id number] Date of Birth/Sex: Treating RN: 26-Aug-1940 (82 y.o. 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 Home Health [facility]: - Monday and Wednesday dressing changes; wound care appointments on Fridays [facility] [phone] [facility] for wound care. May utilize formulary equivalent dressing for wound treatment orders unless otherwise specified. Home Health Nurse may visit PRN to address patients wound care needs. Scheduled days for dressing changes to be completed; exception, patient has scheduled wound care visit that day. **Please direct any NON-WOUND related issues/requests for orders to patient's Primary Care Physician.  **If current dressing causes regression in wound condition, may D/C ordered dressing product/s and apply Normal Saline Moist Dressing daily until next Wound Healing Center or Other MD appointment. **Notify Wound Healing Center of regression in wound condition at [phone]. Bathing/ Shower/ Hygiene May shower; gently cleanse wound with antibacterial soap, rinse and pat dry prior to dressing wounds Anesthetic (Use 'Patient Medications' Section for Anesthetic Order Entry) Lidocaine applied to  wound bed Off-Loading Turn and reposition every 2 hours Additional Orders / Instructions Follow Nutritious Diet and Increase Protein Intake Wound Treatment Wound #1 - Gluteus Wound Laterality: Right Prim Dressing: Hydrofera Blue Ready Transfer Foam, 2.5x2.5 (in/in) 3 x Per Week/30 Days ary Discharge Instructions: cut in a spiral and lightly pack into wound Secondary Dressing: ABD Pad 5x9 (in/in) 3 x Per Week/30 Days Discharge Instructions: cut to fit over wound then cover with border dressing Secondary Dressing: (BORDER) Zetuvit Plus SILICONE BORDER Dressing 5x5 (in/in) (Home Health) 3 x Per Week/30 Days Discharge Instructions: Please do not put silicone bordered dressings under wraps. Use non-bordered dressing only. Electronic Signature(s) Signed: 11/23/2022 11:43:38 AM By: Betha Loa Signed: 11/23/2022 12:23:32 PM By: Lenda Kelp PA-C Entered By: Betha Loa on 11/23/2022 11:16:41 -------------------------------------------------------------------------------- Problem List Details Patient Name: Date of Service: [name] ES, [name] 11/23/2022 9:30 A M Medical Record Number: [id number] Patient Account Number: [id number] Date of Birth/Sex: Treating RN: 09-07-1940 (82 y.o. 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 11/23/2022 9:30 A M Medical Record Number: [id number] Patient Account Number: [id number] Date of Birth/Sex: Treating RN: 08/12/1941 (82 y.o. Skip Mayer Primary Care Provider: Dorothey Baseman Other Clinician: Betha Loa Referring Provider: Treating Provider/Extender: Jannifer Rodney in Treatment: 2 Subjective Chief Complaint Information obtained from Patient Right gluteal contusion History of Present Illness (HPI) 11-08-2022 upon evaluation today patient presents for initial evaluation here in our clinic concerning an issue which occurred when she had a fall hitting her right gluteal region and this was actually on 09-04-2022. Since that time she has had an area of eschar as well as an area that was deeper that has been present she has had a hard time getting this to close. Subsequently I do believe this was likely a hematoma that occurred as result of contusion and that is why this is probably not healing as quickly as it needs to we also need to get the necrotic tissue off of this wound bed. Patient does not have any major medical problems. She actually is a very healthy 82 year old. 11-16-2022 upon evaluation today patient appears to be doing better in regard to her wound although a lot of the necrotic tissue started to loosen up  more we got have to definitely perform some debridement today. Fortunately I do not see any evidence of infection locally nor systemically which is great news. 11-23-2022 upon evaluation today patient appears to be doing poorly currently in regard to her wound which actually is a bit deeper. Nonetheless I think that this is something that is showing signs of the need for some sharp debridement she has an area of skin connecting where we cannot properly packed the wound we need to remove this and I discussed that with her today. I am going to need to numb her for this we will use lidocaine in order to do that so that  make sure we do not cause any pain or problems. Fortunately I do not see any signs of active infection locally nor systemically at this time which is great news. Objective Constitutional Well-nourished and well-hydrated in no acute distress. Vitals Time Taken: 9:42 AM, Height: 68 in, Weight: 104 lbs, BMI: 15.8, Temperature: 98.2 F, Pulse: 76 bpm, Respiratory Rate: 16 breaths/min, Blood Pressure: 145/74 mmHg. Respiratory normal breathing without difficulty. Psychiatric this patient is able to make decisions and demonstrates good insight into disease process. Alert and Oriented x 3. pleasant and cooperative. General Notes: Upon inspection patient's wound bed actually does have still some necrotic debris we need to try to see about getting the wound improved as far as the overall necrotic tissue is concerned and I do think that she will benefit from removing this area skin connecting between the 2 openings of the wound that connect underneath to 1. This would allow [place] to more appropriately pack which I think would do a much better job for her. I think we need to switch to [facility]. I did perform the procedure today without complication there was bleeding but postprocedure I was able to achieve hemostasis limited silver nitrate use. Integumentary (Hair, Skin) Wound #1 status is  Open. Original cause of wound was Pressure Injury. The date acquired was: 09/04/2022. The wound has been in treatment 2 weeks. The wound is located on the Right Gluteus. The wound measures 2cm length x 2.8cm width x 1.2cm depth; 4.398cm^2 area and 5.278cm^3 volume. There is Fat Layer (Subcutaneous Tissue) exposed. There is tunneling at 12:00 with a maximum distance of 2.5cm. There is additional tunneling and at 9:00 with a maximum distance of 3.5cm. There is a medium amount of serous drainage noted. The wound margin is flat and intact. There is no granulation within the wound bed. There is a large (67-100%) amount of necrotic tissue within the wound bed including Eschar and Adherent Slough. 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 There was a Excisional Skin/Subcutaneous Tissue Debridement with a total area of 1 sq cm performed by Nelida Meuse., PA-C. With the following instrument(s): Forceps, and Scissors to remove Viable and Non-Viable tissue/material. Material removed includes Subcutaneous Tissue, Slough, Skin: Dermis, and Skin: Epidermis. A time out was conducted at 10:30, prior to the start of the procedure. A Moderate amount of bleeding was controlled with Silver Nitrate. The procedure was tolerated well. Post Debridement Measurements: 2cm length x 2.8cm width x 1.2cm depth; 5.278cm^3 volume. Character of Wound/Ulcer Post Debridement is stable. Post procedure Diagnosis Wound #1: Same as Pre-Procedure Plan Follow-up Appointments: Wound #1 Right Gluteus: Return Appointment in 1 week. Home Health: Home [facility] Company: - Monday and Wednesday dressing changes; wound care appointments on Fridays  [phone]. May utilize formulary equivalent dressing for wound treatment orders unless otherwise specified. Home Health Nurse may visit PRN to address patientoos wound care needs. Scheduled days for dressing changes to be completed; exception, patient has scheduled wound care visit that day. **Please direct any NON-WOUND related issues/requests for orders to patient's Primary Care Physician. **If current dressing causes regression in wound condition, may D/C ordered dressing product/s and apply Normal Saline Moist Dressing daily until next Wound Healing Center or Other  MD appointment. **Notify Wound Healing Center of regression in wound condition at [phone]. Bathing/ Shower/ Hygiene: May shower; gently cleanse wound with antibacterial soap, rinse and pat dry prior to dressing wounds Anesthetic (Use 'Patient Medications' Section for Anesthetic Order Entry): Lidocaine applied to wound bed Off-Loading: Turn and reposition every 2 hours Additional Orders / Instructions: Follow Nutritious Diet and Increase Protein Intake WOUND #1: - Gluteus Wound Laterality: Right Prim Dressing: Hydrofera Blue Ready Transfer Foam, 2.5x2.5 (in/in) 3 x Per Week/30 Days ary Discharge Instructions: cut in a spiral and lightly pack into wound Secondary Dressing: ABD Pad 5x9 (in/in) 3 x Per Week/30 Days Discharge Instructions: cut to fit over wound then cover with border dressing Secondary Dressing: (BORDER) Zetuvit Plus SILICONE BORDER Dressing 5x5 (in/in) (Home Health) 3 x Per Week/30 Days Discharge Instructions: Please do not put silicone bordered dressings under wraps. Use non-bordered dressing only. 1. I would recommend that we have the patient continue to monitor for any signs of infection or worsening. Based on what I am seeing I do not see bone exposed right now. Nonetheless I do believe that we may want to look into the possibility of an x-ray coming up  considering how deep this goes right now I am not going to do that yet due to the fact that the patient unfortunately has a lot going on today she states that to be possible I will plan for this likely next week. 2. [facility] by the bordered foam dressing. 3. And also can recommend we try to make sure that her mouth is coming out twice a week on Monday and Wednesday and we need to make sure that we are continuing to be able to see her on Friday. We will see patient back for reevaluation in 1 week here in the clinic. If anything worsens or changes patient will contact our office for additional recommendations. Electronic Signature(s) Signed: 11/23/2022 1:18:27 PM By: Lenda KelpStone III, Caliyah Sieh PA-C Previous Signature: 11/23/2022 10:18:48 AM Version By: Lenda KelpStone III, Song Myre PA-C Entered By: Lenda KelpStone III, Lyanna Blystone on 11/23/2022 13:18:27 -------------------------------------------------------------------------------- SuperBill Details Patient Name: Date of Service: Megan RuskGRA V ES, [name] 11/23/2022 Konz, Juliene PinaPATSY L ([id number]) [id number] Page 6 of 6 Medical Record Number: [id number] Patient Account Number: [id number] Date of Birth/Sex: Treating RN: 03-Aug-1941 (82 y.o. 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

## 2022-11-23 NOTE — Progress Notes (Addendum)
[name], [name] ([id number]) [id number] Page 1 of 8 Visit Report for 11/23/2022 Arrival Information Details Patient Name: Date of Service: [name], [place] [name]. 11/23/2022 9:30 A M Medical Record Number: [id number] Patient Account Number: [id number] Date of Birth/Sex: Treating RN: 06-19-1941 (82 y.o. [name] Primary Care [name]: [name] Other Clinician: Betha [name] Referring [name]: Treating [name]/Extender: [name] in Treatment: 2 Visit Information History Since Last Visit All ordered tests and consults were completed: No Patient Arrived: Ambulatory Added or deleted any medications: No Arrival Time: 09:33 Any new allergies or adverse reactions: No Transfer Assistance: None Had a fall or experienced change in No Patient Identification Verified: Yes activities of daily living that may affect Secondary Verification Process Completed: Yes risk of falls: Patient Requires Transmission-Based Precautions: No Signs or symptoms of abuse/neglect since last visito No Patient Has Alerts: No Hospitalized since last visit: No Implantable device outside of the clinic excluding No cellular tissue based products placed in the center since last visit: Has Dressing in Place as Prescribed: Yes Pain Present Now: Yes Electronic Signature(s) Signed: 11/23/2022 11:43:38 AM By: [name] Entered By: [name] on 11/23/2022 09:41:19 -------------------------------------------------------------------------------- Clinic Level of Care Assessment Details Patient Name: Date of Service: [name], [place] [name]. 11/23/2022 9:30 A M Medical Record Number: [id number] Patient Account Number: [id number] Date of Birth/Sex: Treating RN: July 17, 1941 (82 y.o. [name] Primary Care Nethan Caudillo: [name] Other Clinician: Betha [name] Referring Vivaan Helseth: Treating Emiliya Chretien/Extender: [name] in Treatment:  2 Clinic Level of Care Assessment Items TOOL 1 Quantity Score []  - 0 Use when EandM and Procedure is performed on INITIAL visit ASSESSMENTS - Nursing Assessment / Reassessment []  - 0 General Physical Exam (combine w/ comprehensive assessment (listed just below) when performed on new pt. evals) []  - 0 Comprehensive Assessment (HX, ROS, Risk Assessments, Wounds Hx, etc.) ASSESSMENTS - Wound and Skin Assessment / Reassessment []  - 0 Dermatologic / Skin Assessment (not related to wound area) ASSESSMENTS - Ostomy and/or Continence Assessment and Care []  - 0 Incontinence Assessment and Management []  - 0 Ostomy Care Assessment and Management (repouching, etc.) PROCESS - Coordination of Care []  - 0 Simple Patient / Family Education for ongoing care []  - 0 Complex (extensive) Patient / Family Education for ongoing care []  - 0 Staff obtains Chiropractor, Records, T Results / Process Orders est []  - 0 Staff telephones HHA, Nursing Homes / Clarify orders / etc []  - 0 Routine Transfer to another Facility (non-emergent condition) []  - 0 Routine Hospital Admission (non-emergent condition) [name] ([id number]) [id number] Page 2 of 8 []  - 0 New Admissions / Manufacturing engineer / Ordering NPWT Apligraf, etc. , []  - 0 Emergency Hospital Admission (emergent condition) PROCESS - Special Needs []  - 0 Pediatric / Minor Patient Management []  - 0 Isolation Patient Management []  - 0 Hearing / Language / Visual special needs []  - 0 Assessment of Community assistance (transportation, D/C planning, etc.) []  - 0 Additional assistance / Altered mentation []  - 0 Support Surface(s) Assessment (bed, cushion, seat, etc.) INTERVENTIONS - Miscellaneous []  - 0 External ear exam []  - 0 Patient Transfer (multiple staff / Nurse, adult / Similar devices) []  - 0 Simple Staple / Suture removal (25 or less) []  - 0 Complex Staple / Suture removal (26 or more) []  -  0 Hypo/Hyperglycemic Management (do not check if billed separately) []  - 0 Ankle / Brachial Index (ABI) - do not check if  billed separately Has the patient been seen at the hospital within the last three years: Yes Total Score: 0 Level Of Care: ____ Electronic Signature(s) Signed: 11/23/2022 11:43:38 AM By: [name] Entered By: [name] on 11/23/2022 11:13:26 -------------------------------------------------------------------------------- Encounter Discharge Information Details Patient Name: Date of Service: [name], Megan [name]. 11/23/2022 9:30 A M Medical Record Number: [id number] Patient Account Number: [id number] Date of Birth/Sex: Treating RN: 11/07/1940 (82 y.o. [name] Primary Care Lakisha Peyser: [name] Other Clinician: Betha [name] Referring Delesa Kawa: Treating Adorian Gwynne/Extender: [name] in Treatment: 2 Encounter Discharge Information Items Post Procedure Vitals Discharge Condition: Stable Temperature (F): 98.2 Ambulatory Status: Ambulatory Pulse (bpm): 76 Discharge Destination: Home Respiratory Rate (breaths/min): 16 Transportation: Other Blood Pressure (mmHg): 145/74 Accompanied By: self Schedule Follow-up Appointment: Yes Clinical Summary of Care: Electronic Signature(s) Signed: 11/23/2022 11:43:38 AM By: [name] Entered By: [name] on 11/23/2022 11:17:12 -------------------------------------------------------------------------------- Lower Extremity Assessment Details Patient Name: Date of Service: Megan [name], Megan [name]. 11/23/2022 9:30 A M Medical Record Number: [id number] Patient Account Number: [id number] Date of Birth/Sex: Treating RN: [birth year] (82 y.o. [name] Primary Care Kolt Mcwhirter: [name] Other Clinician: Betha [name] Referring Evans Levee: Treating Kache Mcclurg/Extender: [name] in Treatment: 2 Electronic Signature(s) [place], Juliene Pina ([id number])  [id number] Page 3 of 8 Signed: 11/23/2022 11:43:38 AM By: [name] Signed: 11/23/2022 2:01:12 PM By: Elliot Gurney, BSN, RN, CWS, [name] RN, BSN Entered By: [name] on 11/23/2022 09:55:44 -------------------------------------------------------------------------------- Multi Wound Chart Details Patient Name: Date of Service: [name], Megan [name]. 11/23/2022 9:30 A M Medical Record Number: [id number] Patient Account Number: [id number] Date of Birth/Sex: Treating RN: 1940/08/28 (82 y.o. [name] Primary Care Aloura Matsuoka: [name] Other Clinician: Betha [name] Referring Elisea Khader: Treating Doroteo Nickolson/Extender: [name] in Treatment: 2 Vital Signs Height(in): 68 Pulse(bpm): 76 Weight(lbs): 104 Blood Pressure(mmHg): 145/74 Body Mass Index(BMI): 15.8 Temperature(F): 98.2 Respiratory Rate(breaths/min): 16 [1:Photos:] [N/A:N/A] Right Gluteus N/A N/A Wound Location: Pressure Injury N/A N/A Wounding Event: Trauma, Other N/A N/A Primary Etiology: Anemia, History of pressure wounds, N/A N/A Comorbid History: Rheumatoid Arthritis, Osteoarthritis 09/04/2022 N/A N/A Date Acquired: 2 N/A N/A Weeks of Treatment: Open N/A N/A Wound Status: No N/A N/A Wound Recurrence: 2x2.8x1.2 N/A N/A Measurements [name] x W x D (cm) 4.398 N/A N/A A (cm) : rea 5.278 N/A N/A Volume (cm) : 20.00% N/A N/A % Reduction in A rea: -220.10% N/A N/A % Reduction in Volume: 12 Position 1 (o'clock): 2.5 Maximum Distance 1 (cm): 9 Position 2 (o'clock): 3.5 Maximum Distance 2 (cm): Yes N/A N/A Tunneling: Unclassifiable N/A N/A Classification: Medium N/A N/A Exudate A mount: Serous N/A N/A Exudate Type: amber N/A N/A Exudate Color: Flat and Intact N/A N/A Wound Margin: None Present (0%) N/A N/A Granulation A mount: Large (67-100%) N/A N/A Necrotic A mount: Eschar, Adherent Slough N/A N/A Necrotic Tissue: Fat Layer (Subcutaneous  Tissue): Yes N/A N/A Exposed Structures: Fascia: No Tendon: No Muscle: No Joint: No Bone: No None N/A N/A Epithelialization: Treatment Notes Electronic Signature(s) Signed: 11/23/2022 11:43:38 AM By: [name] Entered By: [name] on 11/23/2022 09:55:48 Wingert, Guillermo [name] ([id number]) [id number] Page 4 of 8 -------------------------------------------------------------------------------- Multi-Disciplinary Care Plan Details Patient Name: Date of Service: [name], [place] [name]. 11/23/2022 9:30 A M Medical Record Number: [id number] Patient Account Number: [id number] Date of Birth/Sex: Treating RN: 18-Jun-1941 (82 y.o. [name] Primary Care Donn Zanetti: [name] Other Clinician: Betha [name] Referring Margo Lama: Treating  Modene Andy/Extender: [name] in Treatment: 2 Active Inactive Abuse / Safety / Falls / Self Care Management Nursing Diagnoses: Potential for falls Potential for injury related to abuse or neglect Goals: Patient will not experience any injury related to falls Date Initiated: 11/13/2022 Target Resolution Date: 11/08/2022 Goal Status: Active Patient will remain injury free related to falls Date Initiated: 11/13/2022 Target Resolution Date: 11/08/2022 Goal Status: Active Patient/caregiver will verbalize understanding of skin care regimen Date Initiated: 11/13/2022 Target Resolution Date: 11/08/2022 Goal Status: Active Interventions: Assess self care needs on admission and as needed Provide education on personal and home safety Notes: Necrotic Tissue Nursing Diagnoses: Impaired tissue integrity related to necrotic/devitalized tissue Knowledge deficit related to management of necrotic/devitalized tissue Goals: Necrotic/devitalized tissue will be minimized in the wound bed Date Initiated: 11/13/2022 Target Resolution Date: 11/08/2022 Goal Status: Active Patient/caregiver will verbalize understanding of  reason and process for debridement of necrotic tissue Date Initiated: 11/13/2022 Target Resolution Date: 11/08/2022 Goal Status: Active Interventions: Assess patient pain level pre-, during and post procedure and prior to discharge Provide education on necrotic tissue and debridement process Treatment Activities: Apply topical anesthetic as ordered : 11/08/2022 Excisional debridement : 11/08/2022 Notes: Nutrition Nursing Diagnoses: Imbalanced nutrition Potential for alteratiion in Nutrition/Potential for imbalanced nutrition Goals: Patient/caregiver agrees to and verbalizes understanding of need to use nutritional supplements and/or vitamins as prescribed Date Initiated: 11/13/2022 Target Resolution Date: 11/08/2022 Goal Status: Active Interventions: Provide education on nutrition Treatment Activities: Special diet education : 11/08/2022 MARLEAN, SNOWBERGER ([id number]) [phone].pdf Page 5 of 8 Notes: Orientation to the Wound Care Program Nursing Diagnoses: Knowledge deficit related to the wound healing center program Goals: Patient/caregiver will verbalize understanding of the Wound Healing Center Program Date Initiated: 11/13/2022 Target Resolution Date: 11/08/2022 Goal Status: Active Interventions: Provide education on orientation to the wound center Notes: Pressure Nursing Diagnoses: Knowledge deficit related to causes and risk factors for pressure ulcer development Knowledge deficit related to management of pressures ulcers Potential for impaired tissue integrity related to pressure, friction, moisture, and shear Goals: Patient will remain free from development of additional pressure ulcers Date Initiated: 11/13/2022 Target Resolution Date: 11/08/2022 Goal Status: Active Patient/caregiver will verbalize understanding of pressure ulcer management Date Initiated: 11/13/2022 Target Resolution Date: 11/08/2022 Goal Status: Active Interventions: Assess:  immobility, friction, shearing, incontinence upon admission and as needed Assess offloading mechanisms upon admission and as needed Provide education on pressure ulcers Notes: Wound/Skin Impairment Nursing Diagnoses: Impaired tissue integrity Knowledge deficit related to smoking impact on wound healing Knowledge deficit related to ulceration/compromised skin integrity Goals: Patient/caregiver will verbalize understanding of skin care regimen Date Initiated: 11/13/2022 Target Resolution Date: 11/08/2022 Goal Status: Active Ulcer/skin breakdown will have a volume reduction of 30% by week 4 Date Initiated: 11/13/2022 Target Resolution Date: 12/06/2022 Goal Status: Active Ulcer/skin breakdown will have a volume reduction of 50% by week 8 Date Initiated: 11/13/2022 Target Resolution Date: 01/03/2023 Goal Status: Active Ulcer/skin breakdown will have a volume reduction of 80% by week 12 Date Initiated: 11/13/2022 Target Resolution Date: 01/31/2023 Goal Status: Active Ulcer/skin breakdown will heal within 14 weeks Date Initiated: 11/13/2022 Target Resolution Date: 02/14/2023 Goal Status: Active Interventions: Assess patient/caregiver ability to obtain necessary supplies Assess ulceration(s) every visit Provide education on ulcer and skin care Treatment Activities: Skin care regimen initiated : 11/08/2022 Notes: Electronic Signature(s) Signed: 11/23/2022 11:43:38 AM By: [name] Signed: 11/23/2022 2:01:12 PM By: Elliot Gurney, BSN, RN, CWS, [name] RN, BSN Bucy, Signed: 11/23/2022 2:01:12 PM By: Elliot Gurney, BSN, RN, CWS,  Selena BattenKim RN, BSN Juliene PinaPATSY [name] ([id number]) [id number] Page 6 of 8 Entered By: Megan LoaVenable, Angie on 11/23/2022 11:13:36 -------------------------------------------------------------------------------- Pain Assessment Details Patient Name: Date of Service: Megan RuskGRA V [name], [place] [name]. 11/23/2022 9:30 A M Medical Record Number: [id number] Patient Account Number: [id number] Date of  Birth/Sex: Treating RN: Apr 26, 1941 (82 y.o. Megan MayerF) Woody, [name] Primary Care Zuleima Haser: Megan BasemanBronstein, David Other Clinician: Betha LoaVenable, Angie Referring Ula Couvillon: Treating Caellum Mancil/Extender: Megan RodneyStone, Hoyt Bronstein, David Weeks in Treatment: 2 Active Problems Location of Pain Severity and Description of Pain Patient Has Paino Yes Site Locations Pain Location: Pain in Ulcers Duration of the Pain. Constant / Intermittento Constant Rate the pain. Current Pain Level: 3 Character of Pain Describe the Pain: Aching, Other: "it just feels sore" Pain Management and Medication Current Pain Management: Medication: Yes Cold Application: No Rest: No Massage: No Activity: No T.E.N.S.: No Heat Application: No Leg drop or elevation: No Is the Current Pain Management Adequate: Inadequate How does your wound impact your activities of daily livingo Sleep: No Bathing: No Appetite: No Relationship With Others: No Bladder Continence: No Emotions: No Bowel Continence: No Work: No Toileting: No Drive: No Dressing: No Hobbies: No Electronic Signature(s) Signed: 11/23/2022 11:43:38 AM By: Megan LoaVenable, Angie Signed: 11/23/2022 2:01:12 PM By: Elliot GurneyWoody, BSN, RN, CWS, [name] RN, BSN Entered By: Megan LoaVenable, Angie on 11/23/2022 09:44:33 -------------------------------------------------------------------------------- Patient/Caregiver Education Details Patient Name: Date of Service: Megan RoyaltyGRA V [name], Megan [name]. 4/5/2024andnbsp9:30 A M Medical Record Number: [id number] Patient Account Number: [id number] Date of Birth/Gender: Treating RN: Apr 26, 1941 (82 y.o. Megan MayerF) Woody, [name] Primary Care Physician: Megan BasemanBronstein, David Other Clinician: Betha LoaVenable, Angie Referring Physician: Treating Physician/Extender: Megan RodneyStone, Hoyt Bronstein, David Weeks in Treatment: 2 Irby, Juliene PinaPATSY [name] ([id number]) [id number] Page 7 of 8 Education Assessment Education Provided To: Patient Education Topics Provided Offloading: Handouts: Other:  turn or stand every 1-2 hours Methods: Explain/Verbal Responses: State content correctly Electronic Signature(s) Signed: 11/23/2022 11:43:38 AM By: Megan LoaVenable, Angie Entered By: Megan LoaVenable, Angie on 11/23/2022 11:15:27 -------------------------------------------------------------------------------- Wound Assessment Details Patient Name: Date of Service: Megan RoyaltyGRA V [name], Megan [name]. 11/23/2022 9:30 A M Medical Record Number: [id number] Patient Account Number: [id number] Date of Birth/Sex: Treating RN: Apr 26, 1941 (82 y.o. Cathlean CowerF) Woody, [name] Primary Care Dayon Witt: Megan BasemanBronstein, David Other Clinician: Betha LoaVenable, Angie Referring Larena Ohnemus: Treating Brayln Duque/Extender: Megan RodneyStone, Hoyt Bronstein, David Weeks in Treatment: 2 Wound Status Wound Number: 1 Primary Trauma, Other Etiology: Wound Location: Right Gluteus Wound Status: Open Wounding Event: Pressure Injury Comorbid Anemia, History of pressure wounds, Rheumatoid Arthritis, Date Acquired: 09/04/2022 History: Osteoarthritis Weeks Of Treatment: 2 Clustered Wound: No Photos Wound Measurements Length: (cm) 2 Width: (cm) 2.8 Depth: (cm) 1.2 Area: (cm) 4.398 Volume: (cm) 5.278 % Reduction in Area: 20% % Reduction in Volume: -220.1% Epithelialization: None Tunneling: Yes Location 1 Position (o'clock): 12 Maximum Distance: (cm) 2.5 Location 2 Position (o'clock): 9 Maximum Distance: (cm) 3.5 Wound Description Classification: Full Thickness Without Exposed Support Structures Wound Margin: Flat and Intact Exudate Amount: Medium Exudate Type: Serous Exudate Color: amber Foul Odor After Cleansing: No Slough/Fibrino Yes Wound Bed Pinzon, Bassheva [name] ([id number]) [id number] Page 8 of 8 Granulation Amount: None Present (0%) Exposed Structure Necrotic Amount: Large (67-100%) Fascia Exposed: No Necrotic Quality: Eschar, Adherent Slough Fat Layer (Subcutaneous Tissue) Exposed: Yes Tendon Exposed: No Muscle Exposed: No Joint Exposed:  No Bone Exposed: No Treatment Notes Wound #1 (Gluteus) Wound Laterality: Right Cleanser Peri-Wound Care Topical Primary Dressing Hydrofera Blue Ready Transfer Foam, 2.5x2.5 (in/in) Discharge Instruction: cut in a spiral and lightly pack into wound Secondary Dressing ABD Pad 5x9 (  in/in) Discharge Instruction: Cover with ABD pad (BORDER) Zetuvit Plus SILICONE BORDER Dressing 5x5 (in/in) Discharge Instruction: Please do not put silicone bordered dressings under wraps. Use non-bordered dressing only. Secured With Compression Wrap Compression Stockings Facilities manager) Signed: 11/23/2022 11:43:38 AM By: [name] Signed: 11/23/2022 2:01:12 PM By: Elliot Gurney, BSN, RN, CWS, [name] RN, BSN Entered By: [name] on 11/23/2022 11:07:36 -------------------------------------------------------------------------------- Vitals Details Patient Name: Date of Service: [name], Megan [name]. 11/23/2022 9:30 A M Medical Record Number: [id number] Patient Account Number: [id number] Date of Birth/Sex: Treating RN: February 10, 1941 (82 y.o. Cathlean Cower, [name] Primary Care Brennyn Ortlieb: [name] Other Clinician: Betha [name] Referring Wright Gravely: Treating Finas Delone/Extender: [name] in Treatment: 2 Vital Signs Time Taken: 09:42 Temperature (F): 98.2 Height (in): 68 Pulse (bpm): 76 Weight (lbs): 104 Respiratory Rate (breaths/min): 16 Body Mass Index (BMI): 15.8 Blood Pressure (mmHg): 145/74 Reference Range: 80 - 120 mg / dl Electronic Signature(s) Signed: 11/23/2022 11:43:38 AM By: [name] Entered By: [name] on 11/23/2022 09:44:28

## 2022-11-26 DIAGNOSIS — L89313 Pressure ulcer of right buttock, stage 3: Secondary | ICD-10-CM | POA: Diagnosis not present

## 2022-11-26 DIAGNOSIS — M1990 Unspecified osteoarthritis, unspecified site: Secondary | ICD-10-CM | POA: Diagnosis not present

## 2022-11-26 DIAGNOSIS — M24541 Contracture, right hand: Secondary | ICD-10-CM | POA: Diagnosis not present

## 2022-11-26 DIAGNOSIS — M069 Rheumatoid arthritis, unspecified: Secondary | ICD-10-CM | POA: Diagnosis not present

## 2022-11-26 DIAGNOSIS — G8929 Other chronic pain: Secondary | ICD-10-CM | POA: Diagnosis not present

## 2022-11-26 DIAGNOSIS — E785 Hyperlipidemia, unspecified: Secondary | ICD-10-CM | POA: Diagnosis not present

## 2022-11-26 DIAGNOSIS — I890 Lymphedema, not elsewhere classified: Secondary | ICD-10-CM | POA: Diagnosis not present

## 2022-11-29 DIAGNOSIS — L89313 Pressure ulcer of right buttock, stage 3: Secondary | ICD-10-CM | POA: Diagnosis not present

## 2022-11-30 ENCOUNTER — Encounter: Payer: PPO | Admitting: Physician Assistant

## 2022-11-30 DIAGNOSIS — S31819A Unspecified open wound of right buttock, initial encounter: Secondary | ICD-10-CM | POA: Diagnosis not present
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
## 2022-11-30 NOTE — Progress Notes (Addendum)
CHARLOTTE, FIDALGO ([id number]) [id number] Page 1 of 5 Visit Report for 11/30/2022 Chief Complaint Document Details Patient Name: Date of Service: [name], [name] 11/30/2022 9:30 A M Medical Record Number: [id number] Patient Account Number: [id number] Date of Birth/Sex: Treating RN: May 01, 1941 (82 y.o. Skip Mayer Primary Care Provider: Dorothey Baseman Other Clinician: Referring Provider: Treating Provider/Extender: Jannifer Rodney in Treatment: 3 Information Obtained from: Patient Chief Complaint Right gluteal contusion Electronic Signature(s) Signed: 11/30/2022 9:39:15 AM By: Allen Derry PA-C Entered By: Allen Derry on 11/30/2022 09:39:15 -------------------------------------------------------------------------------- HPI Details Patient Name: Date of Service: [name], [name] 11/30/2022 9:30 A M Medical Record Number: [id number] Patient Account Number: [id number] Date of Birth/Sex: Treating RN: 10/08/1940 (82 y.o. Skip Mayer Primary Care Provider: Dorothey Baseman Other Clinician: Referring Provider: Treating Provider/Extender: Jannifer Rodney in Treatment: 3 History of Present Illness HPI Description: 11-08-2022 upon evaluation today patient presents for initial evaluation here in our clinic concerning an issue which occurred when she had a fall hitting her right gluteal region and this was actually on 09-04-2022. Since that time she has had an area of eschar as well as an area that was deeper that has been present she has had a hard time getting this to close. Subsequently I do believe this was likely a hematoma that occurred as result of contusion and that is why this is probably not healing as quickly as it needs to we also need to get the necrotic tissue off of this wound bed. Patient does not have any major medical problems. She actually is a very healthy 82 year old. 11-16-2022 upon evaluation today  patient appears to be doing better in regard to her wound although a lot of the necrotic tissue started to loosen up more we got have to definitely perform some debridement today. Fortunately I do not see any evidence of infection locally nor systemically which is great news. 11-23-2022 upon evaluation today patient appears to be doing poorly currently in regard to her wound which actually is a bit deeper. Nonetheless I think that this is something that is showing signs of the need for some sharp debridement she has an area of skin connecting where we cannot properly packed the wound we need to remove this and I discussed that with her today. I am going to need to numb her for this we will use lidocaine in order to do that so that make sure we do not cause any pain or problems. Fortunately I do not see any signs of active infection locally nor systemically at this time which is great news. 11-30-2022 upon evaluation today patient appears to be doing well currently in regard to her wound. She has been tolerating the dressing changes without complication. Fortunately there does not appear to be any signs of active infection locally nor systemically at this time. I think that the procedure last week clearing away the area of tissue between the 2 openings has made a dramatic benefit for her. Electronic Signature(s) Signed: 11/30/2022 10:14:49 AM By: Allen Derry PA-C Entered By: Allen Derry on 11/30/2022 10:14:49 -------------------------------------------------------------------------------- Physical Exam Details Patient Name: Date of Service: [name], [name] 11/30/2022 9:30 A M Medical Record Number: [id number] Patient Account Number: [id number] Date of Birth/Sex: Treating RN: [birth year] (82 y.o. Skip Mayer Primary Care Provider: Dorothey Baseman Other Clinician: Referring Provider: Treating Provider/Extender: Jannifer Rodney in Treatment: 3 Constitutional Raysor, Juliene Pina  ([id number]) [id number]  Page 2 of 5 Well-nourished and well-hydrated in no acute distress. Respiratory normal breathing without difficulty. Psychiatric this patient is able to make decisions and demonstrates good insight into disease process. Alert and Oriented x 3. pleasant and cooperative. Notes Upon inspection patient's wound bed actually showed signs of good granulation epithelization at this point. Fortunately I do not see any signs of active infection locally nor systemically which is great news. No fevers, chills, nausea, vomiting, or diarrhea. Electronic Signature(s) Signed: 11/30/2022 10:15:20 AM By: Allen Derry PA-C Entered By: Allen Derry on 11/30/2022 10:15:20 -------------------------------------------------------------------------------- Physician Orders Details Patient Name: Date of Service: [name], [name] 11/30/2022 9:30 A M Medical Record Number: [id number] Patient Account Number: [id number] Date of Birth/Sex: Treating RN: 01-22-1941 (82 y.o. 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 Home [facility]: - Monday and Wednesday dressing changes; wound care appointments on Fridays [facility] [phone] [facility] for wound care. May utilize formulary equivalent dressing for wound treatment orders unless otherwise specified. Home Health Nurse may visit PRN to address patients wound care needs. Scheduled days for dressing changes to be completed; exception, patient has scheduled wound care visit that  day. **Please direct any NON-WOUND related issues/requests for orders to patient's Primary Care Physician. **If current dressing causes regression in wound condition, may D/C ordered dressing product/s and apply Normal Saline Moist Dressing daily until next Wound Healing Center or Other MD appointment. **Notify Wound Healing Center of regression in wound condition at [phone]. Bathing/ Shower/ Hygiene May shower; gently cleanse wound with antibacterial soap, rinse and pat dry prior to dressing wounds Anesthetic (Use 'Patient Medications' Section for Anesthetic Order Entry) Lidocaine applied to wound bed Off-Loading Turn and reposition every 2 hours Additional Orders / Instructions Follow Nutritious Diet and Increase Protein Intake Wound Treatment Wound #1 - Gluteus Wound Laterality: Right Prim Dressing: Hydrofera Blue Ready Transfer Foam, 2.5x2.5 (in/in) (Home Health) 3 x Per Week/30 Days ary Discharge Instructions: cut in a spiral and lightly pack into wound; lighty pack into tunnel at 9am and 12pm Secondary Dressing: (BORDER) Zetuvit Plus SILICONE BORDER Dressing 5x5 (in/in) (Home Health) 3 x Per Week/30 Days Discharge Instructions: Please do not put silicone bordered dressings under wraps. Use non-bordered dressing only. Electronic Signature(s) Signed: 11/30/2022 12:36:06 PM By: Elliot Gurney, BSN, RN, CWS, [name] RN, BSN Signed: 11/30/2022 1:04:02 PM By: Jonni Sanger, Signed: 11/30/2022 1:04:02 PM By: Meredith Mody ([id number]) [id number] Page 3 of 5 Entered By: Elliot Gurney, BSN, RN, CWS, [name] on 11/30/2022 10:16:14 -------------------------------------------------------------------------------- Problem List Details Patient Name: Date of Service: [name], [name] 11/30/2022 9:30 A M Medical Record Number: [id number] Patient Account Number: [id number] Date of Birth/Sex: Treating RN: 02-04-1941 (82 y.o. 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 11/30/2022 9:30 A M Medical Record Number: [id number] Patient Account Number: [id number] Date of Birth/Sex: Treating RN: [birth year] (82 y.o. Skip Mayer Primary Care Provider: Dorothey Baseman Other Clinician: Referring Provider: Treating Provider/Extender: Jannifer Rodney in Treatment: 3 Subjective Chief Complaint Information obtained from Patient Right gluteal contusion History of Present Illness (HPI) 11-08-2022 upon evaluation today patient presents for initial evaluation here in our clinic concerning an issue which occurred when she had a fall hitting her right gluteal region and this was actually on 09-04-2022. Since that time she has had an area of eschar as well as an area that was deeper that has been present she has had a hard time getting this to close. Subsequently I do believe this was likely a hematoma that occurred as result of contusion and that is why this is probably not healing as quickly as it needs to we also need to get the necrotic tissue off of this wound bed. Patient does not have any major medical problems. She actually is a very healthy 82 year old. 11-16-2022 upon evaluation today patient appears to be doing better in regard to her wound although a lot of the  necrotic tissue started to loosen up more we got have to definitely perform some debridement today. Fortunately I do not see any evidence of infection locally nor systemically which is great news. 11-23-2022 upon evaluation today patient appears to be doing poorly currently in regard to her wound which actually is a bit deeper. Nonetheless I think that this is something that is showing signs of the need for some sharp debridement she has an area of skin connecting where we cannot properly packed the wound we need to remove this and I discussed that with her today. I am going to need to numb her for this we will use lidocaine in order to do that so that make sure we do not cause any pain or problems. Fortunately I do not see any signs of active infection locally nor systemically at this time which is great news. 11-30-2022 upon evaluation today patient appears to be doing well currently in regard to her wound. She has been tolerating the dressing changes without complication. Fortunately there does not appear to be any signs of active infection locally nor systemically at this time. I think that the procedure last week clearing away the area of tissue between the 2 openings has made a dramatic benefit for her. ADALENE, SUMPTION ([id number]) [id number] Page 4 of 5 Objective Constitutional Well-nourished and well-hydrated in no acute distress. Vitals Time Taken: 9:30 AM, Height: 68 in, Weight: 104 lbs, BMI: 15.8, Temperature: 97.7 F, Pulse: 81 bpm, Respiratory Rate: 16 breaths/min, Blood Pressure: 146/73 mmHg. Respiratory normal breathing without difficulty. Psychiatric this patient is able to make decisions and demonstrates good insight into disease process. Alert and Oriented x 3. pleasant and cooperative. General Notes: Upon inspection patient's wound bed actually showed signs of good granulation epithelization at this point. Fortunately I do not see any signs of active  infection locally nor systemically which is great news. No fevers, chills, nausea, vomiting, or diarrhea. Integumentary (Hair, Skin) Wound #1 status is Open. Original cause of wound was Pressure Injury. The date acquired was: 09/04/2022. The wound has been in treatment 3 weeks. The wound is located on the Right Gluteus. The wound measures 1.5cm length x 2.4cm width x 1cm depth;  2.827cm^2 area and 2.827cm^3 volume. There is Fat Layer (Subcutaneous Tissue) exposed. There is tunneling at 12:00 with a maximum distance of 1.8cm. There is additional tunneling and at 9:00 with a maximum distance of 2.2cm. There is a medium amount of serous drainage noted. The wound margin is flat and intact. There is no granulation within the wound bed. There is a large (67-100%) amount of necrotic tissue within the wound bed including Eschar and Adherent Slough. 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 I am recommend currently that we have the patient continue to monitor for any signs of infection or worsening recommend continue to pack this with the [facility] and the bordered foam dressing to cover this is actually looking much better this week compared to last week. 2. I am also can recommend that the patient should continue to offload is much as she can. I would recommend that she keep the dressing in place is much as possible explained to her that the bordered foam dressing is water resistant and if she gets a little urine or something on it she can wipe it off and it will be fine obviously if it gets really wet that is a different story. She voiced understanding. We will see patient back for reevaluation in 1 week here in the clinic. If anything worsens or changes patient will contact our office for additional recommendations. Electronic Signature(s) Signed: 11/30/2022 10:15:47 AM By:  Allen Derry PA-C Entered By: Allen Derry on 11/30/2022 10:15:47 -------------------------------------------------------------------------------- SuperBill Details Patient Name: Date of Service: [name], [name] 11/30/2022 Medical Record Number: [id number] Patient Account Number: [id number] Date of Birth/Sex: Treating RN: [birth year] (82 y.o. 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

## 2022-12-03 DIAGNOSIS — M0579 Rheumatoid arthritis with rheumatoid factor of multiple sites without organ or systems involvement: Secondary | ICD-10-CM | POA: Diagnosis not present

## 2022-12-03 DIAGNOSIS — M159 Polyosteoarthritis, unspecified: Secondary | ICD-10-CM | POA: Diagnosis not present

## 2022-12-03 DIAGNOSIS — Z79899 Other long term (current) drug therapy: Secondary | ICD-10-CM | POA: Diagnosis not present

## 2022-12-05 DIAGNOSIS — L89313 Pressure ulcer of right buttock, stage 3: Secondary | ICD-10-CM | POA: Diagnosis not present

## 2022-12-06 DIAGNOSIS — L89313 Pressure ulcer of right buttock, stage 3: Secondary | ICD-10-CM | POA: Diagnosis not present

## 2022-12-07 ENCOUNTER — Encounter: Payer: PPO | Admitting: Physician Assistant

## 2022-12-07 DIAGNOSIS — L89319 Pressure ulcer of right buttock, unspecified stage: Secondary | ICD-10-CM | POA: Diagnosis not present

## 2022-12-07 DIAGNOSIS — S300XXA Contusion of lower back and pelvis, initial encounter: Secondary | ICD-10-CM | POA: Diagnosis not present

## 2022-12-07 NOTE — Progress Notes (Addendum)
[name] ([id number]) [id number] Page 1 of 6 Visit Report for 12/07/2022 Chief Complaint Document Details Patient Name: Date of Service: [name] ES, [name] 12/07/2022 11:00 A M Medical Record Number: [id number] Patient Account Number: [id number] Date of Birth/Sex: Treating RN: 01-02-1941 (82 y.o. [name] Primary Care Provider: Dorothey Baseman Other Clinician: Referring Provider: Treating Provider/Extender: Jannifer Rodney in Treatment: 4 Information Obtained from: Patient Chief Complaint Right gluteal contusion Electronic Signature(s) Signed: 12/07/2022 1:12:22 PM By: Midge Aver MSN RN CNS WTA Signed: 12/07/2022 1:44:50 PM By: Allen Derry PA-C Previous Signature: 12/07/2022 10:28:41 AM Version By: Allen Derry PA-C Entered By: Midge Aver on 12/07/2022 10:50:01 -------------------------------------------------------------------------------- Debridement Details Patient Name: Date of Service: [name] ES, [name] 12/07/2022 11:00 A M Medical Record Number: [id number] Patient Account Number: [id number] Date of Birth/Sex: Treating RN: [birth year] (82 y.o. [name] Primary Care Provider: Dorothey Baseman Other Clinician: Referring Provider: Treating Provider/Extender: Jannifer Rodney in Treatment: 4 Debridement Performed for Assessment: Wound #1 Right Gluteus Performed By: Physician Allen Derry, PA-C Debridement Type: Chemical/Enzymatic/Mechanical Agent Used: saline gauze Level of Consciousness (Pre-procedure): Awake and Alert Pre-procedure Verification/Time Out Yes - 11:10 Taken: Start Time: 11:10 Percent of Wound Bed Debrided: Instrument: Other : Saline gauze Bleeding: None Procedural Pain: 0 Post Procedural Pain: 0 Response to Treatment: Procedure was tolerated well Level of Consciousness (Post- Awake and Alert procedure): Post Debridement Measurements of Total Wound Length: (cm)  1.5 Width: (cm) 1.4 Depth: (cm) 3.6 Volume: (cm) 5.938 Character of Wound/Ulcer Post Debridement: Stable Post Procedure Diagnosis Same as Pre-procedure Electronic Signature(s) Signed: 12/10/2022 1:35:16 PM By: Midge Aver MSN RN CNS WTA Signed: 12/10/2022 1:40:31 PM By: Allen Derry PA-C Entered By: Midge Aver on 12/10/2022 13:35:16 HPI Details -------------------------------------------------------------------------------- [name] ([id number]) [id number] Page 2 of 6 Patient Name: Date of Service: [name] ES, [name] 12/07/2022 11:00 A M Medical Record Number: [id number] Patient Account Number: [id number] Date of Birth/Sex: Treating RN: September 27, 1940 (82 y.o. [name] Primary Care Provider: Dorothey Baseman Other Clinician: Referring Provider: Treating Provider/Extender: Jannifer Rodney in Treatment: 4 History of Present Illness HPI Description: 11-08-2022 upon evaluation today patient presents for initial evaluation here in our clinic concerning an issue which occurred when she had a fall hitting her right gluteal region and this was actually on 09-04-2022. Since that time she has had an area of eschar as well as an area that was deeper that has been present she has had a hard time getting this to close. Subsequently I do believe this was likely a hematoma that occurred as result of contusion and that is why this is probably not healing as quickly as it needs to we also need to get the necrotic tissue off of this wound bed. Patient does not have any major medical problems. She actually is a very healthy 82 year old. 11-16-2022 upon evaluation today patient appears to be doing better in regard to her wound although a lot of the necrotic tissue started to loosen up more we got have to definitely perform some debridement today. Fortunately I do not see any evidence of infection locally nor systemically which is great news. 11-23-2022  upon evaluation today patient appears to be doing poorly currently in regard to her wound which actually is a bit deeper. Nonetheless I think that this is something that is showing signs of the need for some sharp debridement she has an area of skin connecting where  we cannot properly packed the wound we need to remove this and I discussed that with her today. I am going to need to numb her for this we will use lidocaine in order to do that so that make sure we do not cause any pain or problems. Fortunately I do not see any signs of active infection locally nor systemically at this time which is great news. 11-30-2022 upon evaluation today patient appears to be doing well currently in regard to her wound. She has been tolerating the dressing changes without complication. Fortunately there does not appear to be any signs of active infection locally nor systemically at this time. I think that the procedure last week clearing away the area of tissue between the 2 openings has made a dramatic benefit for her. 12-07-2022 upon evaluation today patient appears to be doing decently well in regard to her wounds. She has been tolerating the dressing changes without complication. Fortunately there does not appear to be any signs of active infection at this time which is great news. No fevers, chills, nausea, vomiting, or diarrhea. Electronic Signature(s) Signed: 12/07/2022 10:44:33 AM By: Allen Derry PA-C Entered By: Allen Derry on 12/07/2022 10:44:33 -------------------------------------------------------------------------------- Physical Exam Details Patient Name: Date of Service: [name] ES, [name] 12/07/2022 11:00 A M Medical Record Number: [id number] Patient Account Number: [id number] Date of Birth/Sex: Treating RN: [birth year] (82 y.o. [name] Primary Care Provider: Dorothey Baseman Other Clinician: Referring Provider: Treating Provider/Extender: Jannifer Rodney in Treatment:  4 Constitutional Well-nourished and well-hydrated in no acute distress. Respiratory normal breathing without difficulty. Psychiatric this patient is able to make decisions and demonstrates good insight into disease process. Alert and Oriented x 3. pleasant and cooperative. Notes Upon inspection patient's wound bed actually showed signs of good granulation epithelization at this point. Fortunately I do not see any evidence of infection locally nor systemically which is great news and overall I am extremely pleased with where things stand with that being said unfortunately she has a deeper wound here and because of that I think is good to take longer to heal I believe we need to try to do what we can to get this to fill-in I really think she would be a good candidate for a wound VAC. She actually is up and moving around and says she is not sitting all the time she is not in bed all the time. I do not believe that she needs an air mattress but I do believe that she probably does need the wound VAC to help get with additional granulation and filling in at this point. Electronic Signature(s) Signed: 12/07/2022 10:45:22 AM By: Allen Derry PA-C Entered By: Allen Derry on 12/07/2022 10:45:22 -------------------------------------------------------------------------------- Physician Orders Details Patient Name: Date of Service: [name] ES, [name] 12/07/2022 11:00 A M Medical Record Number: [id number] Patient Account Number: [id number] Date of Birth/Sex: Treating RN: 1940-12-20 (82 y.o. 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 Home Health [facility]: - Monday, Wednesday Friday wound vac dressing changes. Wound care center will remove dressing on Fridays and HH will  put it back on in the afternoon. [facility] [phone] [facility] for wound care. May utilize formulary equivalent dressing for wound treatment orders unless otherwise specified. Home Health Nurse may visit PRN to address patients wound care needs. Scheduled days for dressing changes to be completed; exception, patient has scheduled wound care visit that day. **Please direct any NON-WOUND related issues/requests for orders to patient's Primary Care Physician. **If current dressing causes regression in wound condition, may D/C ordered dressing product/s and apply Normal Saline Moist Dressing daily until next Wound Healing Center or Other MD appointment. **Notify Wound Healing Center of regression in wound condition at [phone]. Bathing/ Shower/ Hygiene Clean wound with Normal Saline or wound cleanser. No tub bath. Off-Loading Turn and reposition every 2 hours Additional Orders / Instructions Follow Nutritious Diet and Increase Protein Intake Negative Pressure Wound Therapy Wound VAC settings at continuous pressure. Use foam to wound cavity. Please order WHITE foam to fill any tunnel/s and/or undermining when necessary. Change VAC dressing 3 X WEEK. Change canister as indicated when full. [facility] Motion NPWT System. Will put foam into the wound and bridge up to the hip Home Health Nurse may d/c VAC for s/s of increased infection, significant wound regression, or uncontrolled drainage. Notify Wound Healing Center at [phone]. Wound Treatment Electronic Signature(s) Signed: 12/10/2022 1:37:50 PM By: Midge Aver MSN RN CNS WTA Signed: 12/10/2022 1:40:31 PM By: Allen Derry PA-C Previous Signature:  12/07/2022 1:12:22 PM Version By: Midge Aver MSN RN CNS WTA Previous Signature: 12/07/2022 1:44:50 PM Version By: Allen Derry PA-C Entered By: Midge Aver on 12/10/2022 13:35:29 -------------------------------------------------------------------------------- Problem List Details Patient Name: Date of Service: [name] ES, [name] 12/07/2022 11:00 A M Medical Record Number: [id number] Patient Account Number: [id number] Date of Birth/Sex: Treating RN: 1941-04-02 (82 y.o. 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 12/07/2022 11:00 A M Medical Record Number: [id number] Patient Account Number: [id number] Date of Birth/Sex: Treating RN: 1941/01/30 (82 y.o. [name] Primary Care Provider: Dorothey Baseman Other Clinician: Referring Provider: Treating Provider/Extender: Jannifer Rodney in Treatment: 4 Subjective Chief Complaint Information obtained from Patient Right gluteal contusion History of Present Illness (HPI) 11-08-2022 upon evaluation today patient presents for initial evaluation  here in our clinic concerning an issue which occurred when she had a fall hitting her right gluteal region and this was actually on 09-04-2022. Since that time she has had an area of eschar as well as an area that was deeper that has been present she has had a hard time getting this to close. Subsequently I do believe this was likely a hematoma that occurred as result of contusion and that is why this is probably not healing as quickly as it needs to we also need to get the necrotic tissue off of this wound bed.  Patient does not have any major medical problems. She actually is a very healthy 82 year old. 11-16-2022 upon evaluation today patient appears to be doing better in regard to her wound although a lot of the necrotic tissue started to loosen up more we got have to definitely perform some debridement today. Fortunately I do not see any evidence of infection locally nor systemically which is great news. 11-23-2022 upon evaluation today patient appears to be doing poorly currently in regard to her wound which actually is a bit deeper. Nonetheless I think that this is something that is showing signs of the need for some sharp debridement she has an area of skin connecting where we cannot properly packed the wound we need to remove this and I discussed that with her today. I am going to need to numb her for this we will use lidocaine in order to do that so that make sure we do not cause any pain or problems. Fortunately I do not see any signs of active infection locally nor systemically at this time which is great news. 11-30-2022 upon evaluation today patient appears to be doing well currently in regard to her wound. She has been tolerating the dressing changes without complication. Fortunately there does not appear to be any signs of active infection locally nor systemically at this time. I think that the procedure last week clearing away the area of tissue between the 2 openings has made a dramatic  benefit for her. 12-07-2022 upon evaluation today patient appears to be doing decently well in regard to her wounds. She has been tolerating the dressing changes without complication. Fortunately there does not appear to be any signs of active infection at this time which is great news. No fevers, chills, nausea, vomiting, or diarrhea. Objective Constitutional Well-nourished and well-hydrated in no acute distress. Vitals Time Taken: 10:15 AM, Height: 68 in, Weight: 104 lbs, BMI: 15.8, Temperature: 98.3 F, Pulse: 80 bpm, Respiratory Rate: 16 breaths/min, Blood Pressure: 135/81 mmHg. Respiratory normal breathing without difficulty. Psychiatric this patient is able to make decisions and demonstrates good insight into disease process. Alert and Oriented x 3. pleasant and cooperative. General Notes: Upon inspection patient's wound bed actually showed signs of good granulation epithelization at this point. Fortunately I do not see any evidence of infection locally nor systemically which is great news and overall I am extremely pleased with where things stand with that being said unfortunately she has a deeper wound here and because of that I think is good to take longer to heal I believe we need to try to do what we can to get this to KOLINA, KUBE ([id number]) [id number] Page 5 of 6 fill-in I really think she would be a good candidate for a wound VAC. She actually is up and moving around and says she is not sitting all the time she is not in bed all the time. I do not believe that she needs an air mattress but I do believe that she probably does need the wound VAC to help get with additional granulation and filling in at this point. Integumentary (Hair, Skin) Wound #1 status is Open. Original cause of wound was Pressure Injury. The date acquired was: 09/04/2022. The wound has been in treatment 4 weeks. The wound is located on the Right Gluteus. The wound measures 1.5cm  length x 1.4cm width x 3.5cm depth; 1.649cm^2 area and 5.773cm^3 volume. There is Fat Layer (Subcutaneous Tissue) exposed. There is a medium amount of serous drainage noted. The  wound margin is flat and intact. There is no granulation within the wound bed. There is a large (67-100%) amount of necrotic tissue within the wound bed including Eschar. 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 Home Health: Home Health Company: - Monday and Wednesday dressing changes; wound care appointments on Fridays [facility] [phone] [facility] for wound care. May utilize formulary equivalent dressing for wound treatment orders unless otherwise specified. Home Health Nurse may visit PRN to address patientoos wound care needs. Scheduled days for dressing changes to be completed; exception, patient has scheduled wound care visit that day. **Please direct any NON-WOUND related issues/requests for orders to patient's Primary Care Physician. **If current dressing causes regression in wound condition, may D/C ordered dressing product/s and apply Normal Saline Moist Dressing daily until next Wound Healing Center or Other MD appointment. **Notify Wound Healing Center of regression in wound condition at [phone]. Bathing/ Shower/ Hygiene: May shower; gently cleanse wound with antibacterial soap, rinse and pat dry prior to dressing wounds Anesthetic (Use 'Patient Medications' Section for Anesthetic Order Entry): Lidocaine applied to wound bed Off-Loading: Turn and reposition every 2 hours Additional Orders / Instructions: Follow Nutritious Diet and Increase Protein Intake WOUND #1: - Gluteus Wound Laterality: Right Prim Dressing: Hydrofera Blue Ready Transfer Foam, 2.5x2.5 (in/in) (Home Health) 3 x Per  Week/30 Days ary Discharge Instructions: cut in a spiral and lightly pack into wound; lighty pack into tunnel at 9am and 12pm Secondary Dressing: (BORDER) Zetuvit Plus SILICONE BORDER Dressing 5x5 (in/in) (Home Health) 3 x Per Week/30 Days Discharge Instructions: Please do not put silicone bordered dressings under wraps. Use non-bordered dressing only. 1. I am going to go ahead and see about ordering a wound VAC I would recommend the Invia motion negative pressure wound therapy system from [facility] as this is smaller and I think that she can carry this much more easily with her rheumatoid arthritis and small frame I think a standard wound VAC would be a little bit more difficult for her as mobile as she is. 2. I am good recommend as well that we have the patient continue to monitor for any signs of infection in the meantime. Continue with the [facility] in particular. 3. I really do not feel that the patient needs a group 2 surface as honestly she is mobile she is not bedbound and to be honest I think this would just be more cumbersome than anything for her. She definitely does not want a hospital bed or air mattress at this point. We will see if we can get a pass to bypass this requirement since the patient again is mobile and ambulatory at this point. Nonetheless I told her that I cannot guarantee that will be the case we may have to order the group 2 mattress. 4. We have had previous discussions with the patient she has increased her protein intake and is trying to make sure to eat high-protein foods in order to help with healing and I think this is still going to be of utmost importance again we had that discussion again today as well. We will see patient back for reevaluation in 1 week here in the clinic. If anything worsens or changes patient will contact our office for additional recommendations. Electronic Signature(s) Signed: 12/07/2022 10:48:12 AM By: Allen Derry PA-C Entered By:  Allen Derry  on 12/07/2022 10:48:11 -------------------------------------------------------------------------------- SuperBill Details Patient Name: Date of Service: [name] ES, [name] 12/07/2022 Medical Record Number: [id number] Patient Account Number: [id number] Date of Birth/Sex: Treating RN: 07-03-1941 (82 y.o. 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 Facility Procedures : CPT4 Code: [id number] Description: 99213 - WOUND CARE VISIT-LEV 3 EST PT Modifier: Quantity: 1 Physician Procedures : CPT4 Code Description Modifier [id number] - WC PHYS LEVEL 4 - EST PT ICD-10 Diagnosis Description S30.0XXA Contusion of lower back and pelvis, initial encounter 

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
## 2022-12-10 DIAGNOSIS — T8131XA Disruption of external operation (surgical) wound, not elsewhere classified, initial encounter: Secondary | ICD-10-CM | POA: Diagnosis not present

## 2022-12-11 DIAGNOSIS — M0579 Rheumatoid arthritis with rheumatoid factor of multiple sites without organ or systems involvement: Secondary | ICD-10-CM | POA: Diagnosis not present

## 2022-12-13 DIAGNOSIS — T8131XA Disruption of external operation (surgical) wound, not elsewhere classified, initial encounter: Secondary | ICD-10-CM | POA: Diagnosis not present

## 2022-12-14 ENCOUNTER — Encounter: Payer: PPO | Admitting: Physician Assistant

## 2022-12-14 DIAGNOSIS — L89319 Pressure ulcer of right buttock, unspecified stage: Secondary | ICD-10-CM | POA: Diagnosis not present

## 2022-12-14 DIAGNOSIS — S300XXA Contusion of lower back and pelvis, initial encounter: Secondary | ICD-10-CM | POA: Diagnosis not present

## 2022-12-15 NOTE — Progress Notes (Addendum)
EBONEE, STOBER ([id number]) [id number] Page 1 of 5 Visit Report for 12/14/2022 Chief Complaint Document Details Patient Name: Date of Service: [name] [name], [place] [name] 12/14/2022 10:30 A M Medical Record Number: [id number] Patient Account Number: [id number] Date of Birth/Sex: Treating RN: 1940/11/20 (82 y.o. Skip Mayer Primary Care Provider: Dorothey Baseman Other Clinician: Betha Loa Referring Provider: Treating Provider/Extender: Jannifer Rodney in Treatment: 5 Information Obtained from: Patient Chief Complaint Right gluteal contusion Electronic Signature(s) Signed: 12/14/2022 10:09:29 AM By: Allen Derry PA-C Entered By: Allen Derry on 12/14/2022 10:09:29 -------------------------------------------------------------------------------- HPI Details Patient Name: Date of Service: Megan [name], [name] 12/14/2022 10:30 A M Medical Record Number: [id number] Patient Account Number: [id number] Date of Birth/Sex: Treating RN: 05-Apr-1941 (82 y.o. Skip Mayer Primary Care Provider: Dorothey Baseman Other Clinician: Betha Loa Referring Provider: Treating Provider/Extender: Jannifer Rodney in Treatment: 5 History of Present Illness HPI Description: 11-08-2022 upon evaluation today patient presents for initial evaluation here in our clinic concerning an issue which occurred when she had a fall hitting her right gluteal region and this was actually on 09-04-2022. Since that time she has had an area of eschar as well as an area that was deeper that has been present she has had a hard time getting this to close. Subsequently I do believe this was likely a hematoma that occurred as result of contusion and that is why this is probably not healing as quickly as it needs to we also need to get the necrotic tissue off of this wound bed. Patient does not have any major medical problems. She actually is a very healthy  82 year old. 11-16-2022 upon evaluation today patient appears to be doing better in regard to her wound although a lot of the necrotic tissue started to loosen up more we got have to definitely perform some debridement today. Fortunately I do not see any evidence of infection locally nor systemically which is great news. 11-23-2022 upon evaluation today patient appears to be doing poorly currently in regard to her wound which actually is a bit deeper. Nonetheless I think that this is something that is showing signs of the need for some sharp debridement she has an area of skin connecting where we cannot properly packed the wound we need to remove this and I discussed that with her today. I am going to need to numb her for this we will use lidocaine in order to do that so that make sure we do not cause any pain or problems. Fortunately I do not see any signs of active infection locally nor systemically at this time which is great news. 11-30-2022 upon evaluation today patient appears to be doing well currently in regard to her wound. She has been tolerating the dressing changes without complication. Fortunately there does not appear to be any signs of active infection locally nor systemically at this time. I think that the procedure last week clearing away the area of tissue between the 2 openings has made a dramatic benefit for her. 12-07-2022 upon evaluation today patient appears to be doing decently well in regard to her wounds. She has been tolerating the dressing changes without complication. Fortunately there does not appear to be any signs of active infection at this time which is great news. No fevers, chills, nausea, vomiting, or diarrhea. 12-14-2022 upon evaluation today patient appears to be doing well currently in regard to her wound. She has been tolerating the dressing changes without complication. Fortunately  there does not appear to be any signs of active infection locally nor systemically  which is great news. No fevers, chills, nausea, vomiting, or diarrhea. Electronic Signature(s) Signed: 12/14/2022 11:08:01 AM By: Allen Derry PA-C Entered By: Allen Derry on 12/14/2022 11:08:01 -------------------------------------------------------------------------------- Physical Exam Details Patient Name: Date of Service: Megan Seth Bake ES, [name] 12/14/2022 10:30 A M Medical Record Number: [id number] Patient Account Number: [id number] [name], [name] ([id number]) [id number] Page 2 of 5 Date of Birth/Sex: Treating RN: [birth year] (82 y.o. Skip Mayer Primary Care Provider: Other Clinician: Marland Mcalpine, Karoline Caldwell Referring Provider: Treating Provider/Extender: Jannifer Rodney in Treatment: 5 Constitutional Well-nourished and well-hydrated in no acute distress. Respiratory normal breathing without difficulty. Psychiatric this patient is able to make decisions and demonstrates good insight into disease process. Alert and Oriented x 3. pleasant and cooperative. Notes Upon inspection patient's wound bed actually showed signs of good granulation epithelization at this point. Fortunately I do not see any evidence of infection locally nor systemically which is great news and overall I am extremely pleased with where we stand currently. Electronic Signature(s) Signed: 12/14/2022 11:08:58 AM By: Allen Derry PA-C Entered By: Allen Derry on 12/14/2022 11:08:58 -------------------------------------------------------------------------------- Physician Orders Details Patient Name: Date of Service: [name] ES, [name] 12/14/2022 10:30 A M Medical Record Number: [id number] Patient Account Number: [id number] Date of Birth/Sex: Treating RN: 08-19-1941 (82 y.o. 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 Home Health [facility]: - Monday, Wednesday Friday wound vac dressing changes. Wound care center will remove dressing on Fridays and HH will put it back on in the afternoon. [facility] [phone] [facility] for wound care. May utilize formulary equivalent dressing for wound treatment orders unless otherwise specified. Home Health Nurse may visit PRN to address patients wound care needs. Scheduled days for dressing changes to be completed; exception, patient has scheduled wound care visit that day. **Please direct any NON-WOUND related issues/requests for orders to patient's Primary Care Physician. **If current dressing causes regression in wound condition, may D/C ordered dressing product/s and apply Normal Saline Moist Dressing daily until next Wound Healing Center or Other MD appointment. **Notify Wound Healing Center of regression in wound condition at [phone]. Bathing/ Shower/ Hygiene Clean wound with Normal Saline or wound cleanser. No tub bath. Off-Loading Turn and reposition every 2 hours Additional Orders / Instructions Follow Nutritious Diet and Increase Protein Intake Negative Pressure Wound Therapy Wound VAC settings at continuous pressure. Use foam to wound cavity. Please order WHITE foam to fill any tunnel/s and/or undermining when necessary. Change VAC dressing 3 X WEEK. Change canister as indicated when full. Bonnita Nasuti Motion NPWT System. Will put foam into the wound and bridge up to the hip Cut a 5cm strip to pack into the base of wound, as the foam needs to reach all the way to bottom of the wound bed Home Health  Nurse may d/c VAC for s/s of increased infection, significant wound regression, or uncontrolled drainage. Notify Wound Healing Center at [phone]. Wound Treatment Brosnahan, [name] ([id number]) [id number] Page 3 of 5 Electronic  Signature(s) Signed: 12/14/2022 12:43:01 PM By: Allen Derry PA-C Signed: 12/14/2022 1:22:31 PM By: Betha Loa Entered By: Betha Loa on 12/14/2022 10:54:13 -------------------------------------------------------------------------------- Problem List Details Patient Name: Date of Service: [name] ES, [name] 12/14/2022 10:30 A M Medical Record Number: [id number] Patient Account Number: [id number] Date of Birth/Sex: Treating RN: Aug 09, 1941 (82 y.o. 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 12/14/2022 10:30 A M Medical Record Number: [id number] Patient Account Number: [id number] Date of Birth/Sex: Treating RN: 14-Feb-1941 (82 y.o. Skip Mayer Primary Care Provider: Dorothey Baseman Other Clinician: Betha Loa Referring Provider: Treating Provider/Extender: Jannifer Rodney in Treatment: 5 Subjective Chief Complaint Information obtained from Patient Right gluteal contusion History of Present Illness (HPI) 11-08-2022 upon  evaluation today patient presents for initial evaluation here in our clinic concerning an issue which occurred when she had a fall hitting her right gluteal region and this was actually on 09-04-2022. Since that time she has had an area of eschar as well as an area that was deeper that has been present she has had a hard time getting this to close. Subsequently I do believe this was likely a hematoma that occurred as result of contusion and that is why this is probably not healing as quickly as it needs to we also need to get the necrotic tissue off of this wound bed. Patient does not have any major medical problems. She actually is a very healthy 82 year old. 11-16-2022 upon evaluation today patient appears to be doing better in regard to her wound although a lot of the necrotic tissue started to loosen up more we got have to definitely perform some debridement today. Fortunately I do not see any evidence of infection locally nor systemically which is great news. 11-23-2022 upon evaluation today patient appears to be doing poorly currently in regard to her wound which actually is a bit deeper. Nonetheless I think that this is something that is showing signs of the need for some sharp debridement she has an area of skin connecting where we cannot properly packed the wound we need to remove this and I discussed that with her today. I am going to need to numb her for this we will use lidocaine in order to do that so that make sure we do not cause any pain or problems. Fortunately I do not see any signs of active infection locally nor systemically at this time which is great news. 11-30-2022 upon evaluation today patient appears to be doing well currently in regard to her wound. She has been tolerating the dressing changes without [name], [name] ([id number]) [id number] Page 4 of 5 complication. Fortunately there does not appear to be any signs of active infection locally nor  systemically at this time. I think that the procedure last week clearing away the area of tissue between the 2 openings has made a dramatic benefit for her. 12-07-2022 upon evaluation today patient appears to be doing decently well in regard to her wounds. She has  been tolerating the dressing changes without complication. Fortunately there does not appear to be any signs of active infection at this time which is great news. No fevers, chills, nausea, vomiting, or diarrhea. 12-14-2022 upon evaluation today patient appears to be doing well currently in regard to her wound. She has been tolerating the dressing changes without complication. Fortunately there does not appear to be any signs of active infection locally nor systemically which is great news. No fevers, chills, nausea, vomiting, or diarrhea. Objective Constitutional Well-nourished and well-hydrated in no acute distress. Vitals Time Taken: 10:27 AM, Height: 68 in, Weight: 104 lbs, BMI: 15.8, Temperature: 98.4 F, Pulse: 72 bpm, Respiratory Rate: 16 breaths/min, Blood Pressure: 169/75 mmHg. Respiratory normal breathing without difficulty. Psychiatric this patient is able to make decisions and demonstrates good insight into disease process. Alert and Oriented x 3. pleasant and cooperative. General Notes: Upon inspection patient's wound bed actually showed signs of good granulation epithelization at this point. Fortunately I do not see any evidence of infection locally nor systemically which is great news and overall I am extremely pleased with where we stand currently. Integumentary (Hair, Skin) Wound #1 status is Open. Original cause of wound was Pressure Injury. The date acquired was: 09/04/2022. The wound has been in treatment 5 weeks. The wound is located on the Right Gluteus. The wound measures 1.2cm length x 0.8cm width x 2.6cm depth; 0.754cm^2 area and 1.96cm^3 volume. There is Fat Layer (Subcutaneous Tissue) exposed. There is a  medium amount of serous drainage noted. The wound margin is flat and intact. There is no granulation within the wound bed. There is a large (67-100%) amount of necrotic tissue within the wound bed including Eschar. 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 Home Health: Home Health Company: - Monday, Wednesday Friday wound vac dressing changes. Wound care center will remove dressing on Fridays and HH will put it back on in the afternoon. [facility] [phone] [facility] for wound care. May utilize formulary equivalent dressing for wound treatment orders unless otherwise specified. Home Health Nurse may visit PRN to address patientoos wound care needs. Scheduled days for dressing changes to be completed; exception, patient has scheduled wound care visit that day. **Please direct any NON-WOUND related issues/requests for orders to patient's Primary Care Physician. **If current dressing causes regression in wound condition, may D/C ordered dressing product/s and apply Normal Saline Moist Dressing daily until next Wound Healing Center or Other MD appointment. **Notify Wound Healing Center of regression in wound condition at [phone]. Bathing/ Shower/ Hygiene: Clean wound with Normal Saline or wound cleanser. No tub bath. Off-Loading: Turn and reposition every 2 hours Additional Orders / Instructions: Follow Nutritious Diet and Increase Protein Intake Negative Pressure Wound Therapy: Wound VAC settings at continuous pressure. Use foam to wound cavity. Please order WHITE foam to fill any tunnel/s and/or undermining when necessary. Change VAC dressing 3 X WEEK. Change canister as indicated when full. Bonnita Nasuti Motion NPWT System. Will put foam into the wound and bridge up to the hip Cut a 5cm  strip to pack into the base of wound, as the foam needs to reach all the way to bottom of the wound bed Home Health Nurse may d/c VAC for s/s of increased infection, significant wound regression, or uncontrolled drainage. Notify Wound Healing Center at [phone]. [name], [name] ([id number])  [phone].pdf Page 5 of 5 1. I would recommend that the patient should continue with the wound VAC. I do believe that she is going to need to make sure that this is packed out of the way and again that is not really her responsibility to home health working to get home health called to ensure that there getting this is deep as it needs to go because the depth of the wound is about 5 cm that needs to be packed that entire 5 cm. 2. I am then recommend as well that the patient should continue to monitor for any signs of infection or worsening. Based on what I am seeing I do believe that she is doing well this does need to be bridged due to the location of the wound. We will see patient back for reevaluation in 1 week here in the clinic. If anything worsens or changes patient will contact our office for additional recommendations. Electronic Signature(s) Signed: 12/14/2022 11:09:05 AM By: Allen Derry PA-C Entered By: Allen Derry on 12/14/2022 11:09:05 -------------------------------------------------------------------------------- SuperBill Details Patient Name: Date of Service: [name] ES, [name] 12/14/2022 Medical Record Number: [id number] Patient Account Number: [id number] Date of Birth/Sex: Treating RN: 07-09-1941 (82 y.o. Skip Mayer Primary Care Provider: Dorothey Baseman Other Clinician: Betha Loa Referring Provider: Treating Provider/Extender: Jannifer Rodney in Treatment: 5 Diagnosis Coding ICD-10 Codes Code Description S30.0XXS Contusion of lower back and pelvis, sequela L98.413 Non-pressure chronic ulcer of buttock with necrosis of  muscle Facility Procedures : CPT4 Code: [id number] Description: 10272 - WOUND CARE VISIT-LEV 2 EST PT Modifier: Quantity: 1 Physician Procedures : CPT4 Code Description Modifier [id number] - WC PHYS LEVEL 4 - EST PT ICD-10 Diagnosis Description S30.0XXS Contusion of lower back and pelvis, sequela 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

## 2022-12-15 NOTE — Progress Notes (Addendum)
COLBI, SCHILTZ ([id number]) [phone].pdf Page 1 of 7 Visit Report for 12/14/2022 Arrival Information Details Patient Name: Date of Service: [name] ES, [name] 12/14/2022 10:30 A M Medical Record Number: [id number] Patient Account Number: [id number] Date of Birth/Sex: Treating RN: 1941/02/26 (82 y.o. Skip Mayer Primary Care Queena Monrreal: Dorothey Baseman Other Clinician: Betha Loa Referring Nalin Mazzocco: Treating Marcell Pfeifer/Extender: Jannifer Rodney in Treatment: 5 Visit Information History Since Last Visit All ordered tests and consults were completed: No Patient Arrived: Ambulatory Added or deleted any medications: No Arrival Time: 10:26 Any new allergies or adverse reactions: No Transfer Assistance: None Had a fall or experienced change in No Patient Identification Verified: Yes activities of daily living that may affect Secondary Verification Process Completed: Yes risk of falls: Patient Requires Transmission-Based Precautions: No Signs or symptoms of abuse/neglect since last visito No Patient Has Alerts: No Hospitalized since last visit: No Implantable device outside of the clinic excluding No cellular tissue based products placed in the center since last visit: Has Dressing in Place as Prescribed: Yes Pain Present Now: No Notes Patient took wound vac off at 5 am this morning due to making noises and hurting Electronic Signature(s) Signed: 12/14/2022 1:22:31 PM By: Betha Loa Entered By: Betha Loa on 12/14/2022 10:27:44 -------------------------------------------------------------------------------- Clinic Level of Care Assessment Details Patient Name: Date of Service: [name] ES, [name] 12/14/2022 10:30 A M Medical Record Number: [id number] Patient Account Number: [id number] Date of Birth/Sex: Treating RN: 12-06-1940 (82 y.o. Cathlean Cower, [name] Primary Care Malikhi [name]: Dorothey Baseman Other Clinician: Betha Loa Referring [name]: Treating [name]/Extender: Jannifer Rodney in Treatment: 5 Clinic Level of Care Assessment Items TOOL 4 Quantity Score []  - 0 Use when only an EandM is performed on FOLLOW-UP visit ASSESSMENTS - Nursing Assessment / Reassessment X- 1 10 Reassessment of Co-morbidities (includes updates in patient status) X- 1 5 Reassessment of Adherence to Treatment Plan ASSESSMENTS - Wound and Skin A ssessment / Reassessment X - Simple Wound Assessment / Reassessment - one wound 1 5 []  - 0 Complex Wound Assessment / Reassessment - multiple wounds []  - 0 Dermatologic / Skin Assessment (not related to wound area) ASSESSMENTS - Focused Assessment []  - 0 Circumferential Edema Measurements - multi extremities []  - 0 Nutritional Assessment / Counseling / Intervention []  - 0 Lower Extremity Assessment (monofilament, tuning fork, pulses) []  - 0 Peripheral Arterial Disease Assessment (using hand held doppler) ASSESSMENTS - Ostomy and/or Continence Assessment and Care []  - 0 Incontinence Assessment and Management Dresch, Nysia L ([id number]) [id number] Page 2 of 7 []  - 0 Ostomy Care Assessment and Management (repouching, etc.) PROCESS - Coordination of Care X - Simple Patient / Family Education for ongoing care 1 15 []  - 0 Complex (extensive) Patient / Family Education for ongoing care []  - 0 Staff obtains Chiropractor, Records, T Results / Process Orders est []  - 0 Staff telephones HHA, Nursing Homes / Clarify orders / etc []  - 0 Routine Transfer to another Facility (non-emergent condition) []  - 0 Routine Hospital Admission (non-emergent condition) []  - 0 New Admissions / Manufacturing engineer / Ordering NPWT Apligraf, etc. , []  - 0 Emergency Hospital Admission (emergent condition) X- 1 10 Simple Discharge Coordination []  - 0 Complex (extensive) Discharge Coordination PROCESS - Special Needs []  - 0 Pediatric /  Minor Patient Management []  - 0 Isolation Patient Management []  - 0 Hearing / Language / Visual special needs []  - 0 Assessment of Community assistance (transportation, D/C planning,  etc.) []  - 0 Additional assistance / Altered mentation []  - 0 Support Surface(s) Assessment (bed, cushion, seat, etc.) INTERVENTIONS - Wound Cleansing / Measurement X - Simple Wound Cleansing - one wound 1 5 []  - 0 Complex Wound Cleansing - multiple wounds X- 1 5 Wound Imaging (photographs - any number of wounds) []  - 0 Wound Tracing (instead of photographs) X- 1 5 Simple Wound Measurement - one wound []  - 0 Complex Wound Measurement - multiple wounds INTERVENTIONS - Wound Dressings X - Small Wound Dressing one or multiple wounds 1 10 []  - 0 Medium Wound Dressing one or multiple wounds []  - 0 Large Wound Dressing one or multiple wounds []  - 0 Application of Medications - topical []  - 0 Application of Medications - injection INTERVENTIONS - Miscellaneous []  - 0 External ear exam []  - 0 Specimen Collection (cultures, biopsies, blood, body fluids, etc.) []  - 0 Specimen(s) / Culture(s) sent or taken to Lab for analysis []  - 0 Patient Transfer (multiple staff / Nurse, adult / Similar devices) []  - 0 Simple Staple / Suture removal (25 or less) []  - 0 Complex Staple / Suture removal (26 or more) []  - 0 Hypo / Hyperglycemic Management (close monitor of Blood Glucose) []  - 0 Ankle / Brachial Index (ABI) - do not check if billed separately X- 1 5 Vital Signs Has the patient been seen at the hospital within the last three years: Yes Total Score: 75 Level Of Care: New/Established - Level 2 Hoogendoorn, Daviona L ([id number]) [id number] Page 3 of 7 Electronic Signature(s) Signed: 12/14/2022 1:22:31 PM By: Betha Loa Entered By: Betha Loa on 12/14/2022 10:54:37 -------------------------------------------------------------------------------- Encounter Discharge  Information Details Patient Name: Date of Service: [name] ES, [name] 12/14/2022 10:30 A M Medical Record Number: [id number] Patient Account Number: [id number] Date of Birth/Sex: Treating RN: [birth year] (82 y.o. Skip Mayer Primary Care Gionni Freese: Dorothey Baseman Other Clinician: Betha Loa Referring Lyndzee Kliebert: Treating Daeja Helderman/Extender: Jannifer Rodney in Treatment: 5 Encounter Discharge Information Items Discharge Condition: Stable Ambulatory Status: Ambulatory Discharge Destination: Home Transportation: Other Accompanied By: self Schedule Follow-up Appointment: Yes Clinical Summary of Care: Electronic Signature(s) Signed: 12/14/2022 1:22:31 PM By: Betha Loa Entered By: Betha Loa on 12/14/2022 11:02:16 -------------------------------------------------------------------------------- Lower Extremity Assessment Details Patient Name: Date of Service: [name], [name] 12/14/2022 10:30 A M Medical Record Number: [id number] Patient Account Number: [id number] Date of Birth/Sex: Treating RN: October 12, 1940 (82 y.o. Skip Mayer Primary Care Laikynn Pollio: Dorothey Baseman Other Clinician: Betha Loa Referring Duward Allbritton: Treating Colleena Kurtenbach/Extender: Jannifer Rodney in Treatment: 5 Electronic Signature(s) Signed: 12/14/2022 1:21:35 PM By: Elliot Gurney BSN, RN, CWS, [name] RN, BSN Signed: 12/14/2022 1:22:31 PM By: Betha Loa Entered By: Betha Loa on 12/14/2022 10:41:12 -------------------------------------------------------------------------------- Multi Wound Chart Details Patient Name: Date of Service: [name] ES, [name] 12/14/2022 10:30 A M Medical Record Number: [id number] Patient Account Number: [id number] Date of Birth/Sex: Treating RN: 07/29/1941 (82 y.o. Skip Mayer Primary Care Viren Lebeau: Dorothey Baseman Other Clinician: Betha Loa Referring Berna Gitto: Treating Salaya Holtrop/Extender: Jannifer Rodney in  Treatment: 5 Vital Signs Height(in): 68 Pulse(bpm): 72 Weight(lbs): 104 Blood Pressure(mmHg): 169/75 Body Mass Index(BMI): 15.8 Temperature(F): 98.4 Respiratory Rate(breaths/min): 16 [1:Photos: No Photos Right Gluteus Wound Location: Pressure Injury Wounding Event: Trauma, Other Primary Etiology:] [N/A:N/A N/A N/A N/A] Piontek, Reizy L ([id number]) [1:Anemia, History of pressure wounds, N/A Comorbid History: Rheumatoid Arthritis, Osteoarthritis 09/04/2022 Date Acquired: 5 Weeks of Treatment: Open Wound Status: No Wound Recurrence:  1.2x0.8x2.6 Measurements L x W x D (cm) 0.754 A (cm) : rea 1.96  Volume (cm) : 86.30% % Reduction in Area: -18.90% % Reduction in Volume: Full Thickness Without Exposed Classification: Support Structures Medium Exudate A mount: Serous Exudate Type: amber Exudate Color: Flat and Intact Wound Margin: None Present (0%)  Granulation Amount: Large (67-100%) Necrotic Amount: Eschar Necrotic Tissue: Fat Layer (Subcutaneous Tissue): Yes N/A Exposed Structures: Fascia: No Tendon: No Muscle: No Joint: No Bone: No None Epithelialization:] [N/A:N/A N/A N/A N/A N/A N/A N/A N/A  N/A N/A N/A N/A N/A N/A N/A N/A N/A N/A] Treatment Notes Electronic Signature(s) Signed: 12/14/2022 1:22:31 PM By: Betha Loa Entered By: Betha Loa on 12/14/2022 10:41:16 -------------------------------------------------------------------------------- Multi-Disciplinary Care Plan Details Patient Name: Date of Service: [name], [name] 12/14/2022 10:30 A M Medical Record Number: [id number] Patient Account Number: [id number] Date of Birth/Sex: Treating RN: 12-31-1940 (82 y.o. Skip Mayer Primary Care Hinton Luellen: Dorothey Baseman Other Clinician: Betha Loa Referring Jonathin Heinicke: Treating Diamond Martucci/Extender: Jannifer Rodney in Treatment: 5 Active Inactive Abuse / Safety / Falls / Self Care Management Nursing Diagnoses: Potential for falls Potential for injury related  to abuse or neglect Goals: Patient will not experience any injury related to falls Date Initiated: 11/13/2022 Target Resolution Date: 12/10/2022 Goal Status: Active Patient will remain injury free related to falls Date Initiated: 11/13/2022 Target Resolution Date: 12/10/2022 Goal Status: Active Patient/caregiver will verbalize understanding of skin care regimen Date Initiated: 11/13/2022 Target Resolution Date: 12/10/2022 Goal Status: Active Interventions: Assess self care needs on admission and as needed Provide education on personal and home safety Notes: Wound/Skin Impairment Nursing Diagnoses: Impaired tissue integrity Behrens, Juline L ([id number]) [id number] Page 5 of 7 Knowledge deficit related to smoking impact on wound healing Knowledge deficit related to ulceration/compromised skin integrity Goals: Patient/caregiver will verbalize understanding of skin care regimen Date Initiated: 11/13/2022 Date Inactivated: 11/30/2022 Target Resolution Date: 11/08/2022 Goal Status: Met Ulcer/skin breakdown will have a volume reduction of 30% by week 4 Date Initiated: 11/13/2022 Target Resolution Date: 12/06/2022 Goal Status: Active Ulcer/skin breakdown will have a volume reduction of 50% by week 8 Date Initiated: 11/13/2022 Target Resolution Date: 01/03/2023 Goal Status: Active Ulcer/skin breakdown will have a volume reduction of 80% by week 12 Date Initiated: 11/13/2022 Target Resolution Date: 01/31/2023 Goal Status: Active Ulcer/skin breakdown will heal within 14 weeks Date Initiated: 11/13/2022 Target Resolution Date: 02/14/2023 Goal Status: Active Interventions: Assess patient/caregiver ability to obtain necessary supplies Assess ulceration(s) every visit Provide education on ulcer and skin care Treatment Activities: Skin care regimen initiated : 11/08/2022 Notes: Electronic Signature(s) Signed: 12/14/2022 1:21:35 PM By: Elliot Gurney, BSN, RN, CWS, [name] RN,  BSN Signed: 12/14/2022 1:22:31 PM By: Betha Loa Entered By: Betha Loa on 12/14/2022 10:54:47 -------------------------------------------------------------------------------- Pain Assessment Details Patient Name: Date of Service: [name], [name] 12/14/2022 10:30 A M Medical Record Number: [id number] Patient Account Number: [id number] Date of Birth/Sex: Treating RN: 1940-12-05 (82 y.o. Skip Mayer Primary Care Natalio Salois: Dorothey Baseman Other Clinician: Betha Loa Referring Jacquelyn Shadrick: Treating Fotios Amos/Extender: Jannifer Rodney in Treatment: 5 Active Problems Location of Pain Severity and Description of Pain Patient Has Paino No Site Locations Pain Management and Medication Current Pain Management: CHARMANE, PROTZMAN ([id number]) [phone].pdf Page 6 of 7 Electronic Signature(s) Signed: 12/14/2022 1:21:35 PM By: Elliot Gurney, BSN, RN, CWS, [name] RN, BSN Signed: 12/14/2022 1:22:31 PM By: Betha Loa Entered By: Betha Loa on 12/14/2022 10:30:33 -------------------------------------------------------------------------------- Patient/Caregiver Education Details Patient Name: Date of  Service: [name], PA TSY Elbert Ewings 4/26/2024andnbsp10:30 A M Medical Record Number: [id number] Patient Account Number: [id number] Date of Birth/Gender: Treating RN: 05/22/1941 (82 y.o. Skip Mayer Primary Care Physician: Dorothey Baseman Other Clinician: Betha Loa Referring Physician: Treating Physician/Extender: Jannifer Rodney in Treatment: 5 Education Assessment Education Provided To: Patient Education Topics Provided Wound/Skin Impairment: Handouts: Other: continue wound care as directed Methods: Explain/Verbal Responses: State content correctly Electronic Signature(s) Signed: 12/14/2022 1:22:31 PM By: Betha Loa Entered By: Betha Loa on 12/14/2022  11:01:48 -------------------------------------------------------------------------------- Wound Assessment Details Patient Name: Date of Service: [name], [name] 12/14/2022 10:30 A M Medical Record Number: [id number] Patient Account Number: [id number] Date of Birth/Sex: Treating RN: 03/24/1941 (82 y.o. Cathlean Cower, [name] Primary Care Daksh Coates: Dorothey Baseman Other Clinician: Betha Loa Referring Jehiel Koepp: Treating Rylyn Zawistowski/Extender: Jannifer Rodney in Treatment: 5 Wound Status Wound Number: 1 Primary Trauma, Other Etiology: Wound Location: Right Gluteus Wound Status: Open Wounding Event: Pressure Injury Comorbid Anemia, History of pressure wounds, Rheumatoid Arthritis, Date Acquired: 09/04/2022 History: Osteoarthritis Weeks Of Treatment: 5 Clustered Wound: No Photos Wound Measurements Length: (cm) 1.2 Width: (cm) 0.8 Depth: (cm) 2.6 Area: (cm) 0.754 Tuft, Emmerson L ([id number]) Volume: (cm) 1.96 % Reduction in Area: 86.3% % Reduction in Volume: -18.9% Epithelialization: None [id number] Page 7 of 7 Wound Description Classification: Full Thickness Without Exposed Support Wound Margin: Flat and Intact Exudate Amount: Medium Exudate Type: Serous Exudate Color: amber Structures Foul Odor After Cleansing: No Slough/Fibrino Yes Wound Bed Granulation Amount: None Present (0%) Exposed Structure Necrotic Amount: Large (67-100%) Fascia Exposed: No Necrotic Quality: Eschar Fat Layer (Subcutaneous Tissue) Exposed: Yes Tendon Exposed: No Muscle Exposed: No Joint Exposed: No Bone Exposed: No Treatment Notes Wound #1 (Gluteus) Wound Laterality: Right Cleanser Peri-Wound Care Topical Primary Dressing Secondary Dressing Secured With Compression Wrap Compression Stockings Add-Ons Electronic Signature(s) Signed: 12/14/2022 1:21:35 PM By: Elliot Gurney, BSN, RN, CWS, [name] RN, BSN Signed: 12/14/2022 1:22:31 PM By: Betha Loa Entered By: Betha Loa on 12/14/2022 10:42:29 -------------------------------------------------------------------------------- Vitals Details Patient Name: Date of Service: [name] ES, [name] 12/14/2022 10:30 A M Medical Record Number: [id number] Patient Account Number: [id number] Date of Birth/Sex: Treating RN: Apr 07, 1941 (82 y.o. Cathlean Cower, [name] Primary Care Eytan Carrigan: Dorothey Baseman Other Clinician: Betha Loa Referring Leyton Brownlee: Treating Damaso Laday/Extender: Jannifer Rodney in Treatment: 5 Vital Signs Time Taken: 10:27 Temperature (F): 98.4 Height (in): 68 Pulse (bpm): 72 Weight (lbs): 104 Respiratory Rate (breaths/min): 16 Body Mass Index (BMI): 15.8 Blood Pressure (mmHg): 169/75 Reference Range: 80 - 120 mg / dl Electronic Signature(s) Signed: 12/14/2022 1:22:31 PM By: Betha Loa Entered By: Betha Loa on 12/14/2022 10:30:28

## 2022-12-19 DIAGNOSIS — M069 Rheumatoid arthritis, unspecified: Secondary | ICD-10-CM | POA: Diagnosis not present

## 2022-12-19 DIAGNOSIS — G8929 Other chronic pain: Secondary | ICD-10-CM | POA: Diagnosis not present

## 2022-12-19 DIAGNOSIS — L89313 Pressure ulcer of right buttock, stage 3: Secondary | ICD-10-CM | POA: Diagnosis not present

## 2022-12-19 DIAGNOSIS — M24541 Contracture, right hand: Secondary | ICD-10-CM | POA: Diagnosis not present

## 2022-12-19 DIAGNOSIS — Z9049 Acquired absence of other specified parts of digestive tract: Secondary | ICD-10-CM | POA: Diagnosis not present

## 2022-12-19 DIAGNOSIS — Z7952 Long term (current) use of systemic steroids: Secondary | ICD-10-CM | POA: Diagnosis not present

## 2022-12-19 DIAGNOSIS — E785 Hyperlipidemia, unspecified: Secondary | ICD-10-CM | POA: Diagnosis not present

## 2022-12-19 DIAGNOSIS — M1990 Unspecified osteoarthritis, unspecified site: Secondary | ICD-10-CM | POA: Diagnosis not present

## 2022-12-19 DIAGNOSIS — Z9181 History of falling: Secondary | ICD-10-CM | POA: Diagnosis not present

## 2022-12-19 DIAGNOSIS — Z5982 Transportation insecurity: Secondary | ICD-10-CM | POA: Diagnosis not present

## 2022-12-19 DIAGNOSIS — Z604 Social exclusion and rejection: Secondary | ICD-10-CM | POA: Diagnosis not present

## 2022-12-19 DIAGNOSIS — Z96643 Presence of artificial hip joint, bilateral: Secondary | ICD-10-CM | POA: Diagnosis not present

## 2022-12-19 DIAGNOSIS — I890 Lymphedema, not elsewhere classified: Secondary | ICD-10-CM | POA: Diagnosis not present

## 2022-12-19 DIAGNOSIS — Z7982 Long term (current) use of aspirin: Secondary | ICD-10-CM | POA: Diagnosis not present

## 2022-12-21 ENCOUNTER — Encounter: Payer: PPO | Attending: Physician Assistant | Admitting: Physician Assistant

## 2022-12-21 DIAGNOSIS — L98413 Non-pressure chronic ulcer of buttock with necrosis of muscle: Secondary | ICD-10-CM | POA: Insufficient documentation

## 2022-12-21 DIAGNOSIS — S300XXA Contusion of lower back and pelvis, initial encounter: Secondary | ICD-10-CM | POA: Insufficient documentation

## 2022-12-21 DIAGNOSIS — L98412 Non-pressure chronic ulcer of buttock with fat layer exposed: Secondary | ICD-10-CM | POA: Diagnosis not present

## 2022-12-21 DIAGNOSIS — X58XXXA Exposure to other specified factors, initial encounter: Secondary | ICD-10-CM | POA: Insufficient documentation
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
## 2022-12-22 NOTE — Progress Notes (Signed)
[name] ([id number]) [id number] Page 1 of 5 Visit Report for 12/21/2022 Chief Complaint Document Details Patient Name: Date of Service: [name], [place] [name]. 12/21/2022 9:45 A M Medical Record Number: [id number] Patient Account Number: [id number] Date of Birth/Sex: Treating RN: 12-Mar-1941 (82 y.o. [name] Primary Care Provider: Dorothey [name] Other Clinician: Referring Provider: Treating Provider/Extender: [name] in Treatment: 6 Information Obtained from: Patient Chief Complaint Right gluteal contusion Electronic Signature(s) Signed: 12/21/2022 9:25:30 AM By: [name] [name] Entered By: [name] on 12/21/2022 09:25:30 -------------------------------------------------------------------------------- HPI Details Patient Name: Date of Service: Megan [name], Megan [name]. 12/21/2022 9:45 A M Medical Record Number: [id number] Patient Account Number: [id number] Date of Birth/Sex: Treating RN: 02-06-1941 (82 y.o. [name] Primary Care Provider: Dorothey [name] Other Clinician: Referring Provider: Treating Provider/Extender: [name] in Treatment: 6 History of Present Illness HPI Description: 11-08-2022 upon evaluation today patient presents for initial evaluation here in our clinic concerning an issue which occurred when she had a fall hitting her right gluteal region and this was actually on 09-04-2022. Since that time she has had an area of eschar as well as an area that was deeper that has been present she has had a hard time getting this to close. Subsequently I do believe this was likely a hematoma that occurred as result of contusion and that is why this is probably not healing as quickly as it needs to we also need to get the necrotic tissue off of this wound bed. Patient does not have any major medical problems. She actually is a very healthy 82 year old. 11-16-2022 upon evaluation today  patient appears to be doing better in regard to her wound although a lot of the necrotic tissue started to loosen up more we got have to definitely perform some debridement today. Fortunately I do not see any evidence of infection locally nor systemically which is great news. 11-23-2022 upon evaluation today patient appears to be doing poorly currently in regard to her wound which actually is a bit deeper. Nonetheless I think that this is something that is showing signs of the need for some sharp debridement she has an area of skin connecting where we cannot properly packed the wound we need to remove this and I discussed that with her today. I am going to need to numb her for this we will use lidocaine in order to do that so that make sure we do not cause any pain or problems. Fortunately I do not see any signs of active infection locally nor systemically at this time which is great news. 11-30-2022 upon evaluation today patient appears to be doing well currently in regard to her wound. She has been tolerating the dressing changes without complication. Fortunately there does not appear to be any signs of active infection locally nor systemically at this time. I think that the procedure last week clearing away the area of tissue between the 2 openings has made a dramatic benefit for her. 12-07-2022 upon evaluation today patient appears to be doing decently well in regard to her wounds. She has been tolerating the dressing changes without complication. Fortunately there does not appear to be any signs of active infection at this time which is great news. No fevers, chills, nausea, vomiting, or diarrhea. 12-14-2022 upon evaluation today patient appears to be doing well currently in regard to her wound. She has been tolerating the dressing changes without complication. Fortunately there does not appear  to be any signs of active infection locally nor systemically which is great news. No fevers, chills,  nausea, vomiting, or diarrhea. 12-21-2022 upon evaluation today patient appears to be doing well currently in regard to her wound. There is still is depth here but this seems to be a little bit less than last time this is good news. Fortunately I do not see any signs of active infection locally or systemically at this time which is excellent. Electronic Signature(s) Signed: 12/21/2022 10:44:45 AM By: [name] [name] Entered By: [name] on 12/21/2022 10:44:45 Physical Exam Details -------------------------------------------------------------------------------- [name] ([id number]) [id number] Page 2 of 5 Patient Name: Date of Service: [name], [place] [name]. 12/21/2022 9:45 A M Medical Record Number: [id number] Patient Account Number: [id number] Date of Birth/Sex: Treating RN: 04-07-1941 (82 y.o. [name] Primary Care Provider: Dorothey [name] Other Clinician: Referring Provider: Treating Provider/Extender: [name] in Treatment: 6 Constitutional Well-nourished and well-hydrated in no acute distress. Respiratory normal breathing without difficulty. Psychiatric this patient is able to make decisions and demonstrates good insight into disease process. Alert and Oriented x 3. pleasant and cooperative. Notes Upon inspection patient's wound bed showed signs of some depth although I do feel like this is closing in still this is very slow again have closed. We are using a wound VAC although I think we are still working on trying to make sure this is packed appropriately I think they did get the white foam downward needed to be for the most part today and needs to go all the way to the base of the wound. Electronic Signature(s) Signed: 12/21/2022 10:45:07 AM By: [name] [name] Entered By: [name] on 12/21/2022 10:45:07 -------------------------------------------------------------------------------- Physician Orders  Details Patient Name: Date of Service: [name] ES, Megan [name]. 12/21/2022 9:45 A M Medical Record Number: [id number] Patient Account Number: [id number] Date of Birth/Sex: Treating RN: [birth year] (82 y.o. 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 Home Health [facility]: - Monday, Wednesday Friday wound vac dressing changes. Wound care center will remove dressing on Fridays and HH will put it back on in the afternoon. [facility] [phone] [facility] for wound care. May utilize formulary equivalent dressing for wound treatment orders unless otherwise specified. Home Health Nurse may visit PRN to address patients wound care needs. Scheduled days for dressing changes to be completed; exception, patient has scheduled wound care visit that day. **Please direct any NON-WOUND related issues/requests for orders to patient's Primary Care Physician. **If current dressing causes regression in wound condition, may D/C ordered dressing product/s and apply Normal Saline Moist Dressing daily until next Wound Healing Center or Other MD appointment. **Notify Wound Healing Center of regression in wound condition at [phone]. Bathing/ Shower/ Hygiene Clean wound with Normal Saline or wound cleanser. No tub bath. Off-Loading Turn and reposition every 2 hours Additional Orders / Instructions Follow Nutritious Diet and Increase Protein Intake Other: - home health instructed patient they would not be out until monday, instructed her to bring supplies which patient did not bring, 1/2  hydrofera rope and zutuvit applied in clinic, to be removed on monday and wound vac reapplied  as previously ordered Negative Pressure Wound Therapy Wound VAC settings at continuous pressure. Use foam to wound cavity. Please order WHITE foam to fill any tunnel/s and/or undermining when necessary. Change VAC dressing 3 X WEEK. Change canister as indicated when full. [employer] NPWT System. make [name], [name] ([id number]) [id number] Page 3 of 5 sure white foam reaches bottom of wound base and bridge up to the hip Home Health Nurse may d/c VAC for s/s of increased infection, significant wound regression, or uncontrolled drainage. Notify Wound Healing Center at [phone]. Wound Treatment Electronic Signature(s) Signed: 12/21/2022 11:36:48 AM By: Yevonne Pax RN Signed: 12/21/2022 11:56:30 AM By: [name] [name] Entered By: Yevonne Pax on 12/21/2022 10:41:55 -------------------------------------------------------------------------------- Problem List Details Patient Name: Date of Service: [name] ES, Megan [name]. 12/21/2022 9:45 A M Medical Record Number: [id number] Patient Account Number: [id number] Date of Birth/Sex: Treating RN: Apr 12, 1941 (82 y.o. 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 12/21/2022 9:45 A M Medical Record Number: [id number] Patient Account Number: [id number] Date of Birth/Sex: Treating RN: September 20, 1940 (82 y.o. [name] Primary Care Provider: Dorothey [name] Other Clinician: Referring Provider: Treating Provider/Extender: [name] in Treatment: 6 Subjective Chief Complaint Information obtained from Patient Right gluteal contusion History of Present Illness (HPI) 11-08-2022 upon evaluation today patient presents for initial evaluation here in our clinic concerning an issue which occurred when she had a fall hitting her right gluteal region and this was actually on 09-04-2022. Since that time she has had an area of eschar as well as an area that was deeper that has been present she has had a hard time getting this to close. Subsequently I do believe this was likely a hematoma that occurred as result of contusion and that is why this is probably not healing as quickly as it needs to we also need to get the necrotic tissue off of this wound bed. Patient does not have any major medical problems. She actually is a very healthy 82 year old. 11-16-2022 upon evaluation today patient appears to be doing better in regard to her wound although a lot of the necrotic tissue started to loosen up more we got have to definitely perform some debridement today. Fortunately I do not see any evidence of infection locally nor systemically which is great news. [name], [name] ([id number]) [id number] Page 4 of 5 11-23-2022 upon evaluation today patient appears to be doing poorly currently in regard to her wound which actually is a bit deeper. Nonetheless I think that this is something that is showing signs of the need for some sharp debridement she has an area of skin connecting where we cannot properly packed the wound we need to remove this and I discussed that with her today. I am going to need to numb her for this we will  use lidocaine in order to do that so that make sure we do not cause any pain or problems. Fortunately I do not see any signs of active infection  locally nor systemically at this time which is great news. 11-30-2022 upon evaluation today patient appears to be doing well currently in regard to her wound. She has been tolerating the dressing changes without complication. Fortunately there does not appear to be any signs of active infection locally nor systemically at this time. I think that the procedure last week clearing away the area of tissue between the 2 openings has made a dramatic benefit for her. 12-07-2022 upon evaluation today patient appears to be doing decently well in regard to her wounds. She has been tolerating the dressing changes without complication. Fortunately there does not appear to be any signs of active infection at this time which is great news. No fevers, chills, nausea, vomiting, or diarrhea. 12-14-2022 upon evaluation today patient appears to be doing well currently in regard to her wound. She has been tolerating the dressing changes without complication. Fortunately there does not appear to be any signs of active infection locally nor systemically which is great news. No fevers, chills, nausea, vomiting, or diarrhea. 12-21-2022 upon evaluation today patient appears to be doing well currently in regard to her wound. There is still is depth here but this seems to be a little bit less than last time this is good news. Fortunately I do not see any signs of active infection locally or systemically at this time which is excellent. Objective Constitutional Well-nourished and well-hydrated in no acute distress. Vitals Time Taken: 9:59 AM, Height: 68 in, Weight: 104 lbs, BMI: 15.8, Temperature: 97.4 F, Pulse: 70 bpm, Respiratory Rate: 18 breaths/min, Blood Pressure: 153/70 mmHg. Respiratory normal breathing without difficulty. Psychiatric this patient is able to make decisions and  demonstrates good insight into disease process. Alert and Oriented x 3. pleasant and cooperative. General Notes: Upon inspection patient's wound bed showed signs of some depth although I do feel like this is closing in still this is very slow again have closed. We are using a wound VAC although I think we are still working on trying to make sure this is packed appropriately I think they did get the white foam downward needed to be for the most part today and needs to go all the way to the base of the wound. Integumentary (Hair, Skin) Wound #1 status is Open. Original cause of wound was Pressure Injury. The date acquired was: 09/04/2022. The wound has been in treatment 6 weeks. The wound is located on the Right Gluteus. The wound measures 1cm length x 0.7cm width x 2.3cm depth; 0.55cm^2 area and 1.264cm^3 volume. There is Fat Layer (Subcutaneous Tissue) exposed. There is no tunneling or undermining noted. There is a medium amount of serosanguineous drainage noted. The wound margin is flat and intact. There is no granulation within the wound bed. There is a large (67-100%) amount of necrotic tissue within the wound bed including Eschar. 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 Home Health: Home Health Company: - Monday, Wednesday Friday wound vac dressing changes. Wound care center will remove dressing on Fridays and HH will put it back on in the afternoon. [facility] [phone] [facility] for wound care. May utilize formulary equivalent dressing for wound treatment orders unless otherwise specified. Home Health Nurse may visit PRN to address patientoos wound care needs. Scheduled days for dressing changes to be completed; exception, patient has scheduled wound care visit that day. **  Please direct any  NON-WOUND related issues/requests for orders to patient's Primary Care Physician. **If current dressing causes regression in wound condition, may D/C ordered dressing product/s and apply Normal Saline Moist Dressing daily until next Wound Healing Center or Other MD appointment. **Notify Wound Healing Center of regression in wound condition at [phone]. Bathing/ Shower/ Hygiene: Clean wound with Normal Saline or wound cleanser. No tub bath. [name], [name] ([id number]) [id number] Page 5 of 5 Off-Loading: Turn and reposition every 2 hours Additional Orders / Instructions: Follow Nutritious Diet and Increase Protein Intake Other: - home health instructed patient they would not be out until monday, instructed her to bring supplies which patient did not bring, 1/2 hydrofera rope and zutuvit applied in clinic, to be removed on monday and wound vac reapplied as previously ordered Negative Pressure Wound Therapy: Wound VAC settings at continuous pressure. Use foam to wound cavity. Please order WHITE foam to fill any tunnel/s and/or undermining when necessary. Change VAC dressing 3 X WEEK. Change canister as indicated when full. [employer] NPWT System. make sure white foam reaches bottom of wound base and bridge up to the hip Home Health Nurse may d/c VAC for s/s of increased infection, significant wound regression, or uncontrolled drainage. Notify Wound Healing Center at [phone]. 1. I would recommend currently based on what we are seeing that we go ahead and have the patient continue to monitor for any signs of infection or worsening. I do believe that she is making good progress I do think we need to keep with a wound VAC. 2. Also can recommend that we have the patient continue to offload is much as possible. This is going to definitely help her. 3. I am also going to suggest the patient should continue to utilize [facility] for the weekend since it  sounds like home health is not to be coming out today to put this back on although they should be willing to try to work that out and figure that out going forward. We will see patient back for reevaluation in 1 week here in the clinic. If anything worsens or changes patient will contact our office for additional recommendations. Electronic Signature(s) Signed: 12/21/2022 10:46:06 AM By: [name] [name] Entered By: [name] on 12/21/2022 10:46:06 -------------------------------------------------------------------------------- SuperBill Details Patient Name: Date of Service: [name] ES, Megan [name]. 12/21/2022 Medical Record Number: [id number] Patient Account Number: [id number] Date of Birth/Sex: Treating RN: 10-01-1940 (82 y.o. 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

## 2022-12-24 DIAGNOSIS — G8929 Other chronic pain: Secondary | ICD-10-CM | POA: Diagnosis not present

## 2022-12-24 DIAGNOSIS — Z7982 Long term (current) use of aspirin: Secondary | ICD-10-CM | POA: Diagnosis not present

## 2022-12-24 DIAGNOSIS — M24541 Contracture, right hand: Secondary | ICD-10-CM | POA: Diagnosis not present

## 2022-12-24 DIAGNOSIS — M1990 Unspecified osteoarthritis, unspecified site: Secondary | ICD-10-CM | POA: Diagnosis not present

## 2022-12-24 DIAGNOSIS — E785 Hyperlipidemia, unspecified: Secondary | ICD-10-CM | POA: Diagnosis not present

## 2022-12-24 DIAGNOSIS — Z96643 Presence of artificial hip joint, bilateral: Secondary | ICD-10-CM | POA: Diagnosis not present

## 2022-12-24 DIAGNOSIS — Z9049 Acquired absence of other specified parts of digestive tract: Secondary | ICD-10-CM | POA: Diagnosis not present

## 2022-12-24 DIAGNOSIS — L89313 Pressure ulcer of right buttock, stage 3: Secondary | ICD-10-CM | POA: Diagnosis not present

## 2022-12-24 DIAGNOSIS — Z7952 Long term (current) use of systemic steroids: Secondary | ICD-10-CM | POA: Diagnosis not present

## 2022-12-24 DIAGNOSIS — Z9181 History of falling: Secondary | ICD-10-CM | POA: Diagnosis not present

## 2022-12-24 DIAGNOSIS — M069 Rheumatoid arthritis, unspecified: Secondary | ICD-10-CM | POA: Diagnosis not present

## 2022-12-24 DIAGNOSIS — I890 Lymphedema, not elsewhere classified: Secondary | ICD-10-CM | POA: Diagnosis not present

## 2022-12-24 DIAGNOSIS — Z604 Social exclusion and rejection: Secondary | ICD-10-CM | POA: Diagnosis not present

## 2022-12-24 DIAGNOSIS — Z5982 Transportation insecurity: Secondary | ICD-10-CM | POA: Diagnosis not present

## 2022-12-27 DIAGNOSIS — Z604 Social exclusion and rejection: Secondary | ICD-10-CM | POA: Diagnosis not present

## 2022-12-27 DIAGNOSIS — M069 Rheumatoid arthritis, unspecified: Secondary | ICD-10-CM | POA: Diagnosis not present

## 2022-12-27 DIAGNOSIS — L89313 Pressure ulcer of right buttock, stage 3: Secondary | ICD-10-CM | POA: Diagnosis not present

## 2022-12-27 DIAGNOSIS — Z7952 Long term (current) use of systemic steroids: Secondary | ICD-10-CM | POA: Diagnosis not present

## 2022-12-27 DIAGNOSIS — Z9049 Acquired absence of other specified parts of digestive tract: Secondary | ICD-10-CM | POA: Diagnosis not present

## 2022-12-27 DIAGNOSIS — E785 Hyperlipidemia, unspecified: Secondary | ICD-10-CM | POA: Diagnosis not present

## 2022-12-27 DIAGNOSIS — I890 Lymphedema, not elsewhere classified: Secondary | ICD-10-CM | POA: Diagnosis not present

## 2022-12-27 DIAGNOSIS — Z9181 History of falling: Secondary | ICD-10-CM | POA: Diagnosis not present

## 2022-12-27 DIAGNOSIS — Z7982 Long term (current) use of aspirin: Secondary | ICD-10-CM | POA: Diagnosis not present

## 2022-12-27 DIAGNOSIS — G8929 Other chronic pain: Secondary | ICD-10-CM | POA: Diagnosis not present

## 2022-12-27 DIAGNOSIS — Z5982 Transportation insecurity: Secondary | ICD-10-CM | POA: Diagnosis not present

## 2022-12-27 DIAGNOSIS — M1990 Unspecified osteoarthritis, unspecified site: Secondary | ICD-10-CM | POA: Diagnosis not present

## 2022-12-27 DIAGNOSIS — Z96643 Presence of artificial hip joint, bilateral: Secondary | ICD-10-CM | POA: Diagnosis not present

## 2022-12-27 DIAGNOSIS — M24541 Contracture, right hand: Secondary | ICD-10-CM | POA: Diagnosis not present

## 2022-12-28 ENCOUNTER — Encounter: Payer: PPO | Admitting: Physician Assistant

## 2022-12-28 DIAGNOSIS — L98412 Non-pressure chronic ulcer of buttock with fat layer exposed: Secondary | ICD-10-CM | POA: Diagnosis not present

## 2022-12-28 DIAGNOSIS — S300XXA Contusion of lower back and pelvis, initial encounter: Secondary | ICD-10-CM | POA: Diagnosis not present

## 2022-12-28 NOTE — Progress Notes (Addendum)
WINDSOR, CABAL ([id number]) [id number] Page 1 of 6 Visit Report for 12/28/2022 Chief Complaint Document Details Patient Name: Date of Service: [name], [place] [name] 12/28/2022 10:00 A M Medical Record Number: [id number] Patient Account Number: [id number] Date of Birth/Sex: Treating RN: [birth year] (82 y.o. [name] Primary Care Provider: Dorothey [name] Other Clinician: Referring Provider: Treating Provider/Extender: [name] in Treatment: 7 Information Obtained from: Patient Chief Complaint Right gluteal contusion Electronic Signature(s) Signed: 12/28/2022 10:03:06 AM By: Megan Derry PA-C Entered By: [name] on 12/28/2022 10:03:06 -------------------------------------------------------------------------------- HPI Details Patient Name: Date of Service: Megan [name], [name] 12/28/2022 10:00 A M Medical Record Number: [id number] Patient Account Number: [id number] Date of Birth/Sex: Treating RN: [birth year] (82 y.o. [name] Primary Care Provider: Dorothey [name] Other Clinician: Referring Provider: Treating Provider/Extender: [name] in Treatment: 7 History of Present Illness HPI Description: 11-08-2022 upon evaluation today patient presents for initial evaluation here in our clinic concerning an issue which occurred when she had a fall hitting her right gluteal region and this was actually on 09-04-2022. Since that time she has had an area of eschar as well as an area that was deeper that has been present she has had a hard time getting this to close. Subsequently I do believe this was likely a hematoma that occurred as result of contusion and that is why this is probably not healing as quickly as it needs to we also need to get the necrotic tissue off of this wound bed. Patient does not have any major medical problems. She actually is a very healthy 82 year old. 11-16-2022 upon evaluation today  patient appears to be doing better in regard to her wound although a lot of the necrotic tissue started to loosen up more we got have to definitely perform some debridement today. Fortunately I do not see any evidence of infection locally nor systemically which is great news. 11-23-2022 upon evaluation today patient appears to be doing poorly currently in regard to her wound which actually is a bit deeper. Nonetheless I think that this is something that is showing signs of the need for some sharp debridement she has an area of skin connecting where we cannot properly packed the wound we need to remove this and I discussed that with her today. I am going to need to numb her for this we will use lidocaine in order to do that so that make sure we do not cause any pain or problems. Fortunately I do not see any signs of active infection locally nor systemically at this time which is great news. 11-30-2022 upon evaluation today patient appears to be doing well currently in regard to her wound. She has been tolerating the dressing changes without complication. Fortunately there does not appear to be any signs of active infection locally nor systemically at this time. I think that the procedure last week clearing away the area of tissue between the 2 openings has made a dramatic benefit for her. 12-07-2022 upon evaluation today patient appears to be doing decently well in regard to her wounds. She has been tolerating the dressing changes without complication. Fortunately there does not appear to be any signs of active infection at this time which is great news. No fevers, chills, nausea, vomiting, or diarrhea. 12-14-2022 upon evaluation today patient appears to be doing well currently in regard to her wound. She has been tolerating the dressing changes without complication. Fortunately there does not appear  to be any signs of active infection locally nor systemically which is great news. No fevers, chills,  nausea, vomiting, or diarrhea. 12-21-2022 upon evaluation today patient appears to be doing well currently in regard to her wound. There is still is depth here but this seems to be a little bit less than last time this is good news. Fortunately I do not see any signs of active infection locally or systemically at this time which is excellent. 12-28-2022 upon evaluation today patient appears to be doing well currently in regard to her wound although the wound VAC did not have any foam whatsoever packed into the base of the wound. Fortunately there does not appear to be any signs of infection but again we have to have something to pack down into this area. At this point Hydrofera Blue rope is probably can be our best option. I discussed that with the patient today. With that being said working to send orders to have the [employer]	[place] rope in first and then the wound VAC applied over top of. Nonetheless why they were doing it where they would just put in a little bit [place] not even really doing what needs to be done from a bridge standpoint coupled with just a little time size area foam over the wound is really doing absolutely nothing for this patient. Electronic Signature(s) Signed: 12/28/2022 10:28:05 AM By: Megan Derry PA-C Entered By: [name] on 12/28/2022 10:28:04 [name], Megan Pina ([id number]) [id number] Page 2 of 6 -------------------------------------------------------------------------------- Physical Exam Details Patient Name: Date of Service: Megan Rusk ES, [name] 12/28/2022 10:00 A M Medical Record Number: [id number] Patient Account Number: [id number] Date of Birth/Sex: Treating RN: 04/27/1941 (82 y.o. [name] Primary Care Provider: Dorothey [name] Other Clinician: Referring Provider: Treating Provider/Extender: [name] in Treatment: 7 Constitutional Well-nourished and well-hydrated in no acute  distress. Respiratory normal breathing without difficulty. Psychiatric this patient is able to make decisions and demonstrates good insight into disease process. Alert and Oriented x 3. pleasant and cooperative. Notes Upon inspection patient's wound bed actually showed signs of again it closing externally but not internally which is the issue we need to get this closed internally completely. The area is not quite as big an open as what it was previous but at the same time this is not healing as well as it should due to what I can only deem incompetency with putting on a wound VAC with home health. I believe that they need to be trained properly and if they do not know how to put it on properly they should be being sent out to do this. Electronic Signature(s) Signed: 12/28/2022 10:28:36 AM By: Megan Derry PA-C Entered By: [name] on 12/28/2022 10:28:36 -------------------------------------------------------------------------------- Physician Orders Details Patient Name: Date of Service: Megan Rusk ES, [name] 12/28/2022 10:00 A M Medical Record Number: [id number] Patient Account Number: [id number] Date of Birth/Sex: Treating RN: 05-16-1941 (82 y.o. 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 Home Health [facility]: - Monday, Wednesday Friday wound vac dressing changes. Wound care center will remove dressing on Fridays and HH will put it back on in the afternoon. [facility] [phone] Fax, Rep Alfonzo Beers [phone], Nurse Haynes Kerns [phone] [facility] for  wound care. May utilize formulary equivalent dressing for wound treatment orders unless otherwise specified. Home Health Nurse may visit PRN to address patients wound care needs. Scheduled days for dressing changes to be completed; exception, patient has scheduled wound care visit that day. **Please direct any NON-WOUND related issues/requests for orders to patient's Primary Care Physician. **If current dressing causes regression in wound condition, may D/C ordered dressing product/s and apply Normal Saline Moist Dressing daily until next Wound Healing Center or Other MD appointment. **Notify Wound Healing Center of regression in wound condition at [phone]. Bathing/ Shower/ Hygiene Clean wound with Normal Saline or wound cleanser. No tub bath. Off-Loading Turn and reposition every 2 hours Additional Orders / Instructions NIJAH, BRETADO ([id number]) [id number] Page 3 of 6 Follow Nutritious Diet and Increase Protein Intake Other: - home health instructed patient they would not be out until monday, instructed her to bring supplies which patient did not bring, 1/2 hydrofera rope and zutuvit applied in clinic, to be removed on monday and wound vac reapplied as previously ordered Negative Pressure Wound Therapy Wound VAC settings at continuous pressure. Use foam to wound cavity. Please order WHITE foam to fill any tunnel/s and/or undermining when necessary. Change VAC dressing 3 X WEEK. Change canister as indicated when full. - PLACE HYDROFERA BLUE ROPE MEASURING 6 CM INTO THE WOUND THEN PLACE THE BLACK FOAM OVER THE WOUND AND BRIDGE UP TO THE HIP SO PATIENT IS NOT SITTING ON THE TRACK PAD Home Health Nurse may d/c VAC for s/s of increased infection, significant wound regression, or uncontrolled drainage. Notify Wound Healing Center at [phone]. Wound Treatment Electronic Signature(s) Signed: 12/28/2022 1:20:49 PM By: Megan Derry PA-C Signed: 12/28/2022  1:53:08 PM By: Midge Aver MSN RN CNS WTA Previous Signature: 12/28/2022 10:12:51 AM Version By: Midge Aver MSN RN CNS WTA Entered By: Midge Aver on 12/28/2022 10:53:35 -------------------------------------------------------------------------------- Problem List Details Patient Name: Date of Service: Megan Rusk ES, [name] 12/28/2022 10:00 A M Medical Record Number: [id number] Patient Account Number: [id number] Date of Birth/Sex: Treating RN: 03-11-1941 (82 y.o. 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 12/28/2022 10:00 A M Medical Record Number: [id number] Patient Account Number: [id number] Date of Birth/Sex: Treating RN: 29-Aug-1940 (82 y.o. [name] Primary Care Provider: Dorothey [name] Other Clinician: Referring Provider: Treating Provider/Extender: [name] in Treatment: 7 Subjective Chief Complaint Information obtained from Patient Right gluteal contusion [name], [name] ([id number]) [id number] Page 4 of 6 History of Present Illness (HPI) 11-08-2022 upon evaluation today patient presents for initial evaluation here  in our clinic concerning an issue which occurred when she had a fall hitting her right gluteal region and this was actually on 09-04-2022. Since that time she has had an area of eschar as well as an  area that was deeper that has been present she has had a hard time getting this to close. Subsequently I do believe this was likely a hematoma that occurred as result of contusion and that is why this is probably not healing as quickly as it needs to we also need to get the necrotic tissue off of this wound bed. Patient does not have any major medical problems. She actually is a very healthy 82 year old. 11-16-2022 upon evaluation today patient appears to be doing better in regard to her wound although a lot of the necrotic tissue started to loosen up more we got have to definitely perform some debridement today. Fortunately I do not see any evidence of infection locally nor systemically which is great news. 11-23-2022 upon evaluation today patient appears to be doing poorly currently in regard to her wound which actually is a bit deeper. Nonetheless I think that this is something that is showing signs of the need for some sharp debridement she has an area of skin connecting where we cannot properly packed the wound we need to remove this and I discussed that with her today. I am going to need to numb her for this we will use lidocaine in order to do that so that make sure we do not cause any pain or problems. Fortunately I do not see any signs of active infection locally nor systemically at this time which is great news. 11-30-2022 upon evaluation today patient appears to be doing well currently in regard to her wound. She has been tolerating the dressing changes without complication. Fortunately there does not appear to be any signs of active infection locally nor systemically at this time. I think that the procedure last week clearing away the area of tissue between the 2 openings has made a dramatic benefit  for her. 12-07-2022 upon evaluation today patient appears to be doing decently well in regard to her wounds. She has been tolerating the dressing changes without complication. Fortunately there does not appear to be any signs of active infection at this time which is great news. No fevers, chills, nausea, vomiting, or diarrhea. 12-14-2022 upon evaluation today patient appears to be doing well currently in regard to her wound. She has been tolerating the dressing changes without complication. Fortunately there does not appear to be any signs of active infection locally nor systemically which is great news. No fevers, chills, nausea, vomiting, or diarrhea. 12-21-2022 upon evaluation today patient appears to be doing well currently in regard to her wound. There is still is depth here but this seems to be a little bit less than last time this is good news. Fortunately I do not see any signs of active infection locally or systemically at this time which is excellent. 12-28-2022 upon evaluation today patient appears to be doing well currently in regard to her wound although the wound VAC did not have any foam whatsoever packed into the base of the wound. Fortunately there does not appear to be any signs of infection but again we have to have something to pack down into this area. At this point Hydrofera Blue rope is probably can be our best option. I discussed that with the patient today. With that being said working to send orders to have the [employer]	[place] rope in first and then the wound VAC applied over top of. Nonetheless why they were doing it where they would just put in a little bit [place] not even really doing what needs to be done from  a bridge standpoint coupled with just a little time size area foam over the wound is really doing absolutely nothing for this patient. Objective Constitutional Well-nourished and well-hydrated in no acute distress. Vitals Time Taken: 9:51 AM, Height: 68 in, Weight:  104 lbs, BMI: 15.8, Temperature: 98.0 F, Pulse: 71 bpm, Respiratory Rate: 18 breaths/min, Blood Pressure: 153/75 mmHg. Respiratory normal breathing without difficulty. Psychiatric this patient is able to make decisions and demonstrates good insight into disease process. Alert and Oriented x 3. pleasant and cooperative. General Notes: Upon inspection patient's wound bed actually showed signs of again it closing externally but not internally which is the issue we need to get this closed internally completely. The area is not quite as big an open as what it was previous but at the same time this is not healing as well as it should due to what I can only deem incompetency with putting on a wound VAC with home health. I believe that they need to be trained properly and if they do not know how to put it on properly they should be being sent out to do this. Integumentary (Hair, Skin) Wound #1 status is Open. Original cause of wound was Pressure Injury. The date acquired was: 09/04/2022. The wound has been in treatment 7 weeks. The wound is located on the Right Gluteus. The wound measures 0.7cm length x 0.2cm width x 2.5cm depth; 0.11cm^2 area and 0.275cm^3 volume. There is Fat Layer (Subcutaneous Tissue) exposed. There is a medium amount of serosanguineous drainage noted. The wound margin is flat and intact. There is no granulation within the wound bed. There is a large (67-100%) amount of necrotic tissue within the wound bed including Eschar. 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 Home Health: Home Health Company: - Monday, Wednesday Friday wound vac dressing changes. Wound care center will remove dressing on Fridays and HH will put  it back on in the afternoon. [facility] [phone] [facility] for wound care. May utilize formulary equivalent dressing for wound treatment orders unless otherwise specified. Home Health Nurse may visit PRN to address patientoos wound care needs. Scheduled days for dressing changes to be completed; exception, patient has scheduled wound care visit that day. **Please direct any NON-WOUND related issues/requests for orders to patient's Primary Care Physician. **If current dressing causes regression in wound condition, may D/C ordered dressing product/s and apply Normal Saline Moist Dressing daily until next Wound Healing Center or Other MD appointment. **Notify Wound Healing Center of regression in wound condition at [phone]. Bathing/ Shower/ Hygiene: Clean wound with Normal Saline or wound cleanser. No tub bath. Off-Loading: Turn and reposition every 2 hours Additional Orders / Instructions: Follow Nutritious Diet and Increase Protein Intake Other: - home health instructed patient they would not be out until monday, instructed her to bring supplies which patient did not bring, 1/2 hydrofera rope and zutuvit applied in clinic, to be removed on monday and wound vac reapplied as previously ordered Negative Pressure Wound Therapy: Wound VAC settings at continuous pressure. Use foam to wound cavity. Please order WHITE foam to fill any tunnel/s and/or undermining when necessary. Change VAC dressing 3 X WEEK. Change canister as indicated when full. - PLACE HYDROFERA BLUE ROPE MEASURING 6 CM INTO THE WOUND THEN PLACE THE BLACK FOAM OVER THE  WOUND AND BRIDGE UP TO THE HIP SO PATIENT IS NOT SITTING ON THE TRACK PAD Home Health Nurse may d/c VAC for s/s of increased infection, significant wound regression, or uncontrolled drainage. Notify Wound Healing Center at [phone]. 1. I would recommend that we going use the Hydrofera Blue rope today. I am getting recommend that we call  the home health agency today and we will also go ahead and send orders as well for using the Hydrofera Blue rope and first before applying the wound VAC. 2. I am going to suggest as well that we go ahead and put the [facility] in although have to do is take off the dressing and reapply the wound VAC when they come out later today. 3. I am also going to suggest that the patient should continue with appropriate offloading. 4. We are going to call the home health agency in order to inquire what the world is going on with this wound VAC and the fact that is not being put on properly. I discussed this with the patient today and she understands. We will see patient back for reevaluation in 1 week here in the clinic. If anything worsens or changes patient will contact our office for additional recommendations. Electronic Signature(s) Signed: 12/28/2022 10:29:34 AM By: Megan Derry PA-C Entered By: [name] on 12/28/2022 10:29:34 -------------------------------------------------------------------------------- SuperBill Details Patient Name: Date of Service: Megan [name], [name] 12/28/2022 Medical Record Number: [id number] Patient Account Number: [id number] Date of Birth/Sex: Treating RN: 1941-05-20 (82 y.o. 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 Facility Procedures : CPT4 Code: [id number] Description: 99213 - WOUND CARE VISIT-LEV 3 EST PT Modifier: Quantity: 1 Physician Procedures : CPT4 Code Description Modifier [id number] 99214 - WC PHYS LEVEL 4 - EST PT ICD-10 Diagnosis Description S30.0XXS Contusion of lower back and pelvis, sequela L98.413 Non-pressure chronic ulcer of buttock with necrosis  of muscle [name], [name] ([id number])  [id number] Page 6 of 6 Quantity: 1 Electronic Signature(s) Signed: 12/28/2022 10:29:44 AM By: Megan Derry PA-C Previous Signature: 12/28/2022 10:11:44 AM Version By: Midge Aver MSN RN CNS WTA Entered By: [name] on 12/28/2022 10:29:44
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
## 2023-01-01 DIAGNOSIS — L89313 Pressure ulcer of right buttock, stage 3: Secondary | ICD-10-CM | POA: Diagnosis not present

## 2023-01-04 ENCOUNTER — Encounter: Payer: PPO | Admitting: Physician Assistant

## 2023-01-04 DIAGNOSIS — L89319 Pressure ulcer of right buttock, unspecified stage: Secondary | ICD-10-CM | POA: Diagnosis not present

## 2023-01-04 DIAGNOSIS — S300XXA Contusion of lower back and pelvis, initial encounter: Secondary | ICD-10-CM | POA: Diagnosis not present

## 2023-01-09 DIAGNOSIS — T8131XA Disruption of external operation (surgical) wound, not elsewhere classified, initial encounter: Secondary | ICD-10-CM | POA: Diagnosis not present

## 2023-01-17 DIAGNOSIS — L89313 Pressure ulcer of right buttock, stage 3: Secondary | ICD-10-CM | POA: Diagnosis not present

## 2023-01-18 ENCOUNTER — Encounter: Payer: PPO | Admitting: Physician Assistant

## 2023-01-18 DIAGNOSIS — L89310 Pressure ulcer of right buttock, unstageable: Secondary | ICD-10-CM | POA: Diagnosis not present

## 2023-01-18 DIAGNOSIS — S300XXA Contusion of lower back and pelvis, initial encounter: Secondary | ICD-10-CM | POA: Diagnosis not present

## 2023-01-25 DIAGNOSIS — E785 Hyperlipidemia, unspecified: Secondary | ICD-10-CM | POA: Diagnosis not present

## 2023-01-25 DIAGNOSIS — M0579 Rheumatoid arthritis with rheumatoid factor of multiple sites without organ or systems involvement: Secondary | ICD-10-CM | POA: Diagnosis not present

## 2023-01-25 DIAGNOSIS — E039 Hypothyroidism, unspecified: Secondary | ICD-10-CM | POA: Diagnosis not present

## 2023-01-25 DIAGNOSIS — Z0000 Encounter for general adult medical examination without abnormal findings: Secondary | ICD-10-CM | POA: Diagnosis not present

## 2023-01-25 DIAGNOSIS — L89311 Pressure ulcer of right buttock, stage 1: Secondary | ICD-10-CM | POA: Diagnosis not present

## 2023-01-25 DIAGNOSIS — M810 Age-related osteoporosis without current pathological fracture: Secondary | ICD-10-CM | POA: Diagnosis not present

## 2023-01-25 DIAGNOSIS — D509 Iron deficiency anemia, unspecified: Secondary | ICD-10-CM | POA: Diagnosis not present

## 2023-01-29 ENCOUNTER — Encounter: Payer: PPO | Attending: Physician Assistant | Admitting: Physician Assistant

## 2023-01-29 DIAGNOSIS — S31819A Unspecified open wound of right buttock, initial encounter: Secondary | ICD-10-CM | POA: Diagnosis not present

## 2023-01-29 DIAGNOSIS — L98413 Non-pressure chronic ulcer of buttock with necrosis of muscle: Secondary | ICD-10-CM | POA: Insufficient documentation

## 2023-01-30 NOTE — Progress Notes (Signed)
ANDE, GOEWEY ([id number]) [id number] Page 1 of 7 Visit Report for 01/29/2023 Chief Complaint Document Details Patient Name: Date of Service: [name] ES, [name] 01/29/2023 1:15 PM Medical Record Number: [id number] Patient Account Number: [id number] Date of Birth/Sex: Treating RN: 05-21-1941 (82 y.o. [name] Primary Care Provider: Dorothey Baseman Other Clinician: Referring Provider: Treating Provider/Extender: Jannifer Rodney in Treatment: 11 Information Obtained from: Patient Chief Complaint Right gluteal contusion Electronic Signature(s) Signed: 01/29/2023 2:03:17 PM By: Allen Derry PA-C Entered By: Allen Derry on 01/29/2023 14:03:17 -------------------------------------------------------------------------------- Debridement Details Patient Name: Date of Service: [name] ES, [name] 01/29/2023 1:15 PM Medical Record Number: [id number] Patient Account Number: [id number] Date of Birth/Sex: Treating RN: [birth year] (82 y.o. [name] Primary Care Provider: Dorothey Baseman Other Clinician: Referring Provider: Treating Provider/Extender: Jannifer Rodney in Treatment: 11 Debridement Performed for Assessment: Wound #1 Right Gluteus Performed By: Physician Allen Derry, PA-C Debridement Type: Debridement Level of Consciousness (Pre-procedure): Awake and Alert Pre-procedure Verification/Time Out Yes - 14:07 Taken: Start Time: 14:07 Percent of Wound Bed Debrided: 100% T Area Debrided (cm): otal 0.07 Tissue and other material debrided: Viable, Non-Viable, Slough, Subcutaneous, Slough Level: Skin/Subcutaneous Tissue Debridement Description: Excisional Instrument: Curette Bleeding: Minimum Hemostasis Achieved: Pressure Procedural Pain: 0 Post Procedural Pain: 0 Response to Treatment: Procedure was tolerated well Level of Consciousness (Post- Awake and Alert procedure): Post Debridement Measurements of  Total Wound Length: (cm) 0.3 Width: (cm) 0.3 Depth: (cm) 2.2 Volume: (cm) 0.156 Character of Wound/Ulcer Post Debridement: Stable Post Procedure Diagnosis Same as Pre-procedure Electronic Signature(s) Signed: 01/29/2023 4:52:35 PM By: Midge Aver MSN RN CNS WTA Signed: 01/29/2023 6:26:43 PM By: Allen Derry PA-C Entered By: Midge Aver on 01/29/2023 14:08:22 Bielak, Kailie L ([id number]) [id number] Page 2 of 7 -------------------------------------------------------------------------------- HPI Details Patient Name: Date of Service: [name] ES, [name] 01/29/2023 1:15 PM Medical Record Number: [id number] Patient Account Number: [id number] Date of Birth/Sex: Treating RN: November 12, 1940 (82 y.o. [name] Primary Care Provider: Dorothey Baseman Other Clinician: Referring Provider: Treating Provider/Extender: Jannifer Rodney in Treatment: 11 History of Present Illness HPI Description: 11-08-2022 upon evaluation today patient presents for initial evaluation here in our clinic concerning an issue which occurred when she had a fall hitting her right gluteal region and this was actually on 09-04-2022. Since that time she has had an area of eschar as well as an area that was deeper that has been present she has had a hard time getting this to close. Subsequently I do believe this was likely a hematoma that occurred as result of contusion and that is why this is probably not healing as quickly as it needs to we also need to get the necrotic tissue off of this wound bed. Patient does not have any major medical problems. She actually is a very healthy 82 year old. 11-16-2022 upon evaluation today patient appears to be doing better in regard to her wound although a lot of the necrotic tissue started to loosen up more we got have to definitely perform some debridement today. Fortunately I do not see any evidence of infection locally nor systemically which is  great news. 11-23-2022 upon evaluation today patient appears to be doing poorly currently in regard to her wound which actually is a bit deeper. Nonetheless I think that this is something that is showing signs of the need for some sharp debridement she has an area of skin connecting where we cannot properly packed the  wound we need to remove this and I discussed that with her today. I am going to need to numb her for this we will use lidocaine in order to do that so that make sure we do not cause any pain or problems. Fortunately I do not see any signs of active infection locally nor systemically at this time which is great news. 11-30-2022 upon evaluation today patient appears to be doing well currently in regard to her wound. She has been tolerating the dressing changes without complication. Fortunately there does not appear to be any signs of active infection locally nor systemically at this time. I think that the procedure last week clearing away the area of tissue between the 2 openings has made a dramatic benefit for her. 12-07-2022 upon evaluation today patient appears to be doing decently well in regard to her wounds. She has been tolerating the dressing changes without complication. Fortunately there does not appear to be any signs of active infection at this time which is great news. No fevers, chills, nausea, vomiting, or diarrhea. 12-14-2022 upon evaluation today patient appears to be doing well currently in regard to her wound. She has been tolerating the dressing changes without complication. Fortunately there does not appear to be any signs of active infection locally nor systemically which is great news. No fevers, chills, nausea, vomiting, or diarrhea. 12-21-2022 upon evaluation today patient appears to be doing well currently in regard to her wound. There is still is depth here but this seems to be a little bit less than last time this is good news. Fortunately I do not see any signs of  active infection locally or systemically at this time which is excellent. 12-28-2022 upon evaluation today patient appears to be doing well currently in regard to her wound although the wound VAC did not have any foam whatsoever packed into the base of the wound. Fortunately there does not appear to be any signs of infection but again we have to have something to pack down into this area. At this point Hydrofera Blue rope is probably can be our best option. I discussed that with the patient today. With that being said working to send orders to have the [employer]	[place] rope in first and then the wound VAC applied over top of. Nonetheless why they were doing it where they would just put in a little bit [place] not even really doing what needs to be done from a bridge standpoint coupled with just a little time size area foam over the wound is really doing absolutely nothing for this patient. 01-04-2023 upon evaluation today patient appears to be doing well currently in regard to her wound. She has been tolerating the dressing changes without complication. We did switch after by phone call with the home health from the wound VAC due to the fact that she was not keeping this in place. She subsequently was pulling the wound VAC off for disconnecting it due to the fact that it was not comfortable for her and making a lot of noise. Nonetheless it was doing her noted. 01-18-2023 upon evaluation today patient's wound is actually showing signs of doing about the same at this point. Fortunately she does not appear to be showing any signs of active infection locally nor systemically which is great news. With that being said the wound externally is try to close internally we still have a bit of space here. 01-29-2020 upon evaluation today patient appears to be doing well currently in regard to  her wound which is actually showing signs of improvement. I am very pleased in that regard. I do not see any evidence of  active infection locally or systemically which is great news. Electronic Signature(s) Signed: 01/29/2023 6:03:13 PM By: Allen Derry PA-C Entered By: Allen Derry on 01/29/2023 18:03:13 -------------------------------------------------------------------------------- Physical Exam Details Patient Name: Date of Service: [name], [name] 01/29/2023 1:15 PM Medical Record Number: [id number] Patient Account Number: [id number] Date of Birth/Sex: Treating RN: 06-12-1941 (82 y.o. [name] Primary Care Provider: Dorothey Baseman Other Clinician: Referring Provider: Treating Provider/Extender: Jannifer Rodney in Treatment: 11 Constitutional Well-nourished and well-hydrated in no acute distress. Respiratory Guay, Diedre L ([id number]) [id number] Page 3 of 7 normal breathing without difficulty. Psychiatric this patient is able to make decisions and demonstrates good insight into disease process. Alert and Oriented x 3. pleasant and cooperative. Notes Upon inspection patient's wound does still have some need for sharp debridement she has some necrotic tissue noted in the base of the wound I would recommend going to perform debridement today to clearway necrotic debris she is in agreement with that plan. We will subsequently see where things stand at follow-up. Electronic Signature(s) Signed: 01/29/2023 6:03:44 PM By: Allen Derry PA-C Entered By: Allen Derry on 01/29/2023 18:03:43 -------------------------------------------------------------------------------- Physician Orders Details Patient Name: Date of Service: [name] ES, [name] 01/29/2023 1:15 PM Medical Record Number: [id number] Patient Account Number: [id number] Date of Birth/Sex: Treating RN: [birth year] (82 y.o. [name] Primary Care Provider: Dorothey Baseman Other Clinician: Referring Provider: Treating Provider/Extender: Jannifer Rodney in Treatment:  11 Verbal / Phone Orders: No Diagnosis Coding Follow-up Appointments Wound #1 Right Gluteus Return Appointment in 1 week. Home Health Home Health Company: - Wound vac discontinued [facility] [phone] Fax, Rep Alfonzo Beers [phone], Nurse Haynes Kerns [phone] [facility] for wound care. May utilize formulary equivalent dressing for wound treatment orders unless otherwise specified. Home Health Nurse may visit PRN to address patients wound care needs. Scheduled days for dressing changes to be completed; exception, patient has scheduled wound care visit that day. **Please direct any NON-WOUND related issues/requests for orders to patient's Primary Care Physician. **If current dressing causes regression in wound condition, may D/C ordered dressing product/s and apply Normal Saline Moist Dressing daily until next Wound Healing Center or Other MD appointment. **Notify Wound Healing Center of regression in wound condition at [phone]. Bathing/ Shower/ Hygiene Clean wound with Normal Saline or wound cleanser. No tub bath. Off-Loading Turn and reposition every 2 hours Additional Orders / Instructions Follow Nutritious Diet and Increase Protein Intake Wound Treatment Wound #1 - Gluteus Wound Laterality: Right Cleanser: Wound Cleanser 3 x Per Week/30 Days Discharge Instructions: Wash your hands with soap and water. Remove old dressing, discard into plastic bag and place into trash. Cleanse the wound with Wound Cleanser prior to applying a clean dressing using gauze sponges, not tissues or cotton balls. Do not scrub or use excessive force. Pat dry using gauze sponges, not tissue or cotton balls. Prim Dressing: Hydrofera Blue Classic Foam Rope Dressing, 9x6 (mm/in) 3 x Per Week/30 Days ary Discharge Instructions: cut in half and insert the half piece, do not cut length wise into to two thin pieces, simply cut in half width wise. Secondary Dressing: (BORDER) Zetuvit Plus  SILICONE BORDER Dressing 4x4 (in/in) 3 x Per Week/30 Days Discharge Instructions: Please do not put silicone bordered dressings under wraps. Use non-bordered dressing only. Electronic Signature(s) Signed: 01/29/2023  4:52:35 PM By: Midge Aver MSN RN CNS WTA Signed: 01/29/2023 6:26:43 PM By: Allen Derry PA-C Previous Signature: 01/29/2023 1:32:29 PM Version By: Midge Aver MSN RN CNS WTA Entered By: Midge Aver on 01/29/2023 14:08:37 Augustus, Trenity L ([id number]) [id number] Page 4 of 7 -------------------------------------------------------------------------------- Problem List Details Patient Name: Date of Service: [name], [name] 01/29/2023 1:15 PM Medical Record Number: [id number] Patient Account Number: [id number] Date of Birth/Sex: Treating RN: 1941-06-13 (82 y.o. 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 01/29/2023 1:15 PM Medical Record Number: [id number] Patient Account Number: [id number] Date of Birth/Sex: Treating RN: 18-Apr-1941 (82 y.o. [name] Primary Care Provider: Dorothey Baseman Other Clinician: Referring Provider: Treating Provider/Extender: Jannifer Rodney in Treatment: 11 Subjective Chief Complaint Information obtained from Patient Right  gluteal contusion History of Present Illness (HPI) 11-08-2022 upon evaluation today patient presents for initial evaluation here in our clinic concerning an issue which occurred when she had a fall hitting her right gluteal region and this was actually on 09-04-2022. Since that time she has had an area of eschar as well as an area that was deeper that has been present she has had a hard time getting this to close. Subsequently I do believe this was likely a hematoma that occurred as result of contusion and that is why this is probably not healing as quickly as it needs to we also need to get the necrotic tissue off of this wound bed. Patient does not have any major medical problems. She actually is a very healthy 82 year old. 11-16-2022 upon evaluation today patient appears to be doing better in regard to her wound although a lot of the necrotic tissue started to loosen up more we got have to definitely perform some debridement today. Fortunately I do not see any evidence of infection locally nor systemically which is great news. 11-23-2022 upon evaluation today patient appears to be doing poorly currently in regard to her wound which actually is a bit deeper. Nonetheless I think that this is something that is showing signs of the need for some sharp debridement she has an area of skin connecting where we cannot properly packed the wound we need to remove this and I discussed that with her today. I am going to need to numb her for this we will use lidocaine in order to do that so that make sure we do not cause any pain or problems. Fortunately I do not see any signs of active infection locally nor systemically at this time which is great news. 11-30-2022 upon evaluation today patient appears to be doing well currently in regard to her wound. She has been tolerating the dressing changes without complication. Fortunately there does not appear to be any signs of active infection locally nor systemically at this  time. I think that the procedure last week clearing away the area of tissue between the 2 openings has made a dramatic benefit for her. 12-07-2022 upon evaluation today patient appears to be doing decently  well in regard to her wounds. She has been tolerating the dressing changes without complication. Fortunately there does not appear to be any signs of active infection at this time which is great news. No fevers, chills, nausea, vomiting, or diarrhea. 12-14-2022 upon evaluation today patient appears to be doing well currently in regard to her wound. She has been tolerating the dressing changes without complication. Fortunately there does not appear to be any signs of active infection locally nor systemically which is great news. No fevers, chills, nausea, vomiting, or diarrhea. 12-21-2022 upon evaluation today patient appears to be doing well currently in regard to her wound. There is still is depth here but this seems to be a little bit less Goldie, Cynthya L ([id number]) [id number] Page 5 of 7 than last time this is good news. Fortunately I do not see any signs of active infection locally or systemically at this time which is excellent. 12-28-2022 upon evaluation today patient appears to be doing well currently in regard to her wound although the wound VAC did not have any foam whatsoever packed into the base of the wound. Fortunately there does not appear to be any signs of infection but again we have to have something to pack down into this area. At this point Hydrofera Blue rope is probably can be our best option. I discussed that with the patient today. With that being said working to send orders to have the [employer]	[place] rope in first and then the wound VAC applied over top of. Nonetheless why they were doing it where they would just put in a little bit [place] not even really doing what needs to be done from a bridge standpoint coupled with just a little time size area  foam over the wound is really doing absolutely nothing for this patient. 01-04-2023 upon evaluation today patient appears to be doing well currently in regard to her wound. She has been tolerating the dressing changes without complication. We did switch after by phone call with the home health from the wound VAC due to the fact that she was not keeping this in place. She subsequently was pulling the wound VAC off for disconnecting it due to the fact that it was not comfortable for her and making a lot of noise. Nonetheless it was doing her noted. 01-18-2023 upon evaluation today patient's wound is actually showing signs of doing about the same at this point. Fortunately she does not appear to be showing any signs of active infection locally nor systemically which is great news. With that being said the wound externally is try to close internally we still have a bit of space here. 01-29-2020 upon evaluation today patient appears to be doing well currently in regard to her wound which is actually showing signs of improvement. I am very pleased in that regard. I do not see any evidence of active infection locally or systemically which is great news. Objective Constitutional Well-nourished and well-hydrated in no acute distress. Vitals Time Taken: 1:18 PM, Height: 68 in, Weight: 104 lbs, BMI: 15.8, Temperature: 97.5 F, Pulse: 81 bpm, Respiratory Rate: 16 breaths/min, Blood Pressure: 153/74 mmHg. Respiratory normal breathing without difficulty. Psychiatric this patient is able to make decisions and demonstrates good insight into disease process. Alert and Oriented x 3. pleasant and cooperative. General Notes: Upon inspection patient's wound does still have some need for sharp debridement she has some necrotic tissue noted in the base of the wound I would recommend going to perform debridement today to clearway  necrotic debris she is in agreement with that plan. We will subsequently see where  things stand at follow-up. Integumentary (Hair, Skin) Wound #1 status is Open. Original cause of wound was Pressure Injury. The date acquired was: 09/04/2022. The wound has been in treatment 11 weeks. The wound is located on the Right Gluteus. The wound measures 0.3cm length x 0.3cm width x 2.2cm depth; 0.071cm^2 area and 0.156cm^3 volume. There is a medium amount of serosanguineous drainage noted. 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 There was a Excisional Skin/Subcutaneous Tissue Debridement with a total area of 0.07 sq cm performed by Allen Derry, PA-C. With the following instrument(s): Curette to remove Viable and Non-Viable tissue/material. Material removed includes Subcutaneous Tissue and Slough and. No specimens were taken. A time out was conducted at 14:07, prior to the start of the procedure. A Minimum amount of bleeding was controlled with Pressure. The procedure was tolerated well with a pain level of 0 throughout and a pain level of 0 following the procedure. Post Debridement Measurements: 0.3cm length x 0.3cm width x 2.2cm depth; 0.156cm^3 volume. Character of Wound/Ulcer Post Debridement is stable. Post procedure Diagnosis Wound #1: Same as Pre-Procedure Plan Ganci, Puneet L ([id number]) [id number] Page 6 of 7 Follow-up Appointments: Wound #1 Right Gluteus: Return Appointment in 1 week. Home Health: Home Health Company: - Wound vac discontinued [facility] [phone] Fax, Rep Alfonzo Beers [phone], Nurse Haynes Kerns [phone] [facility] for wound care. May utilize formulary equivalent dressing for wound treatment orders unless otherwise specified. Home Health Nurse may visit PRN to address patients wound care  needs. Scheduled days for dressing changes to be completed; exception, patient has scheduled wound care visit that day. **Please direct any NON-WOUND related issues/requests for orders to patient's Primary Care Physician. **If current dressing causes regression in wound condition, may D/C ordered dressing product/s and apply Normal Saline Moist Dressing daily until next Wound Healing Center or Other MD appointment. **Notify Wound Healing Center of regression in wound condition at [phone]. Bathing/ Shower/ Hygiene: Clean wound with Normal Saline or wound cleanser. No tub bath. Off-Loading: Turn and reposition every 2 hours Additional Orders / Instructions: Follow Nutritious Diet and Increase Protein Intake WOUND #1: - Gluteus Wound Laterality: Right Cleanser: Wound Cleanser 3 x Per Week/30 Days Discharge Instructions: Wash your hands with soap and water. Remove old dressing, discard into plastic bag and place into trash. Cleanse the wound with Wound Cleanser prior to applying a clean dressing using gauze sponges, not tissues or cotton balls. Do not scrub or use excessive force. Pat dry using gauze sponges, not tissue or cotton balls. Prim Dressing: Hydrofera Blue Classic Foam Rope Dressing, 9x6 (mm/in) 3 x Per Week/30 Days ary Discharge Instructions: cut in half and insert the half piece, do not cut length wise into to two thin pieces, simply cut in half width wise. Secondary Dressing: (BORDER) Zetuvit Plus SILICONE BORDER Dressing 4x4 (in/in) 3 x Per Week/30 Days Discharge Instructions: Please do not put silicone bordered dressings under wraps. Use non-bordered dressing only. 1. I would recommend that we have the patient continue to utilize the [facility] rope I think that still can be the best way to go at this time. 2. I am also can recommend that the patient should continue to offload is much as possible. Obviously  more she can stay off of this the better. We will see patient back  for reevaluation in 1 week here in the clinic. If anything worsens or changes patient will contact our office for additional recommendations. Electronic Signature(s) Signed: 01/29/2023 6:03:56 PM By: Allen Derry PA-C Entered By: Allen Derry on 01/29/2023 18:03:55 -------------------------------------------------------------------------------- SuperBill Details Patient Name: Date of Service: [name] ES, [name] 01/29/2023 Medical Record Number: [id number] Patient Account Number: [id number] Date of Birth/Sex: Treating RN: 26-Jan-1941 (82 y.o. 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

## 2023-01-30 NOTE — Progress Notes (Signed)
[name] ([id number]) [id number] Page 1 of 7 Visit Report for 01/29/2023 Arrival Information Details Patient Name: Date of Service: [name] ES, Megan [name]. 01/29/2023 1:15 PM Medical Record Number: [id number] Patient Account Number: [id number] Date of Birth/Sex: Treating RN: Sep 13, 1940 (82 y.o. [name] Primary Care Amyre Segundo: Dorothey Baseman Other Clinician: Referring Cariann Kinnamon: Treating Hedy Garro/Extender: Jannifer Rodney in Treatment: 11 Visit Information History Since Last Visit Added or deleted any medications: No Patient Arrived: Ambulatory Has Dressing in Place as Prescribed: Yes Arrival Time: 13:18 Pain Present Now: No Accompanied By: self Transfer Assistance: None Patient Identification Verified: Yes Secondary Verification Process Completed: Yes Patient Requires Transmission-Based Precautions: No Patient Has Alerts: No Electronic Signature(s) Signed: 01/29/2023 4:52:35 PM By: Midge Aver MSN RN CNS WTA Entered By: Midge Aver on 01/29/2023 13:31:21 -------------------------------------------------------------------------------- Clinic Level of Care Assessment Details Patient Name: Date of Service: [name] ES, PA [name]. 01/29/2023 1:15 PM Medical Record Number: [id number] Patient Account Number: [id number] Date of Birth/Sex: Treating RN: 1941/05/07 (82 y.o. [name] Primary Care Cristofher Livecchi: Dorothey Baseman Other Clinician: Referring Neko Boyajian: Treating Zakaria Fromer/Extender: Jannifer Rodney in Treatment: 11 Clinic Level of Care Assessment Items TOOL 4 Quantity Score X- 1 0 Use when only an EandM is performed on FOLLOW-UP visit ASSESSMENTS - Nursing Assessment / Reassessment X- 1 10 Reassessment of Co-morbidities (includes updates in patient status) X- 1 5 Reassessment of Adherence to Treatment Plan ASSESSMENTS - Wound and Skin A ssessment / Reassessment X - Simple Wound Assessment / Reassessment  - one wound 1 5 []  - 0 Complex Wound Assessment / Reassessment - multiple wounds []  - 0 Dermatologic / Skin Assessment (not related to wound area) ASSESSMENTS - Focused Assessment []  - 0 Circumferential Edema Measurements - multi extremities []  - 0 Nutritional Assessment / Counseling / Intervention []  - 0 Lower Extremity Assessment (monofilament, tuning fork, pulses) []  - 0 Peripheral Arterial Disease Assessment (using hand held doppler) ASSESSMENTS - Ostomy and/or Continence Assessment and Care []  - 0 Incontinence Assessment and Management []  - 0 Ostomy Care Assessment and Management (repouching, etc.) PROCESS - Coordination of Care X - Simple Patient / Family Education for ongoing care 1 15 []  - 0 Complex (extensive) Patient / Family Education for ongoing care X- 1 10 Staff obtains Consents, Records, T Results / Process Orders est [name], [name] ([id number]) [id number] Page 2 of 7 []  - 0 Staff telephones HHA, Nursing Homes / Clarify orders / etc []  - 0 Routine Transfer to another Facility (non-emergent condition) []  - 0 Routine Hospital Admission (non-emergent condition) []  - 0 New Admissions / Manufacturing engineer / Ordering NPWT Apligraf, etc. , []  - 0 Emergency Hospital Admission (emergent condition) X- 1 10 Simple Discharge Coordination []  - 0 Complex (extensive) Discharge Coordination PROCESS - Special Needs []  - 0 Pediatric / Minor Patient Management []  - 0 Isolation Patient Management []  - 0 Hearing / Language / Visual special needs []  - 0 Assessment of Community assistance (transportation, D/C planning, etc.) []  - 0 Additional assistance / Altered mentation []  - 0 Support Surface(s) Assessment (bed, cushion, seat, etc.) INTERVENTIONS - Wound Cleansing / Measurement X - Simple Wound Cleansing - one wound 1 5 []  - 0 Complex Wound Cleansing - multiple wounds X- 1 5 Wound Imaging (photographs - any number of wounds) []   - 0 Wound Tracing (instead of photographs) X- 1 5 Simple Wound Measurement - one wound []  - 0 Complex Wound Measurement - multiple wounds INTERVENTIONS -  Wound Dressings X - Small Wound Dressing one or multiple wounds 1 10 []  - 0 Medium Wound Dressing one or multiple wounds []  - 0 Large Wound Dressing one or multiple wounds []  - 0 Application of Medications - topical []  - 0 Application of Medications - injection INTERVENTIONS - Miscellaneous []  - 0 External ear exam []  - 0 Specimen Collection (cultures, biopsies, blood, body fluids, etc.) []  - 0 Specimen(s) / Culture(s) sent or taken to Lab for analysis []  - 0 Patient Transfer (multiple staff / Nurse, adult / Similar devices) []  - 0 Simple Staple / Suture removal (25 or less) []  - 0 Complex Staple / Suture removal (26 or more) []  - 0 Hypo / Hyperglycemic Management (close monitor of Blood Glucose) []  - 0 Ankle / Brachial Index (ABI) - do not check if billed separately X- 1 5 Vital Signs Has the patient been seen at the hospital within the last three years: Yes Total Score: 85 Level Of Care: New/Established - Level 3 Electronic Signature(s) Signed: 01/29/2023 4:52:35 PM By: Midge Aver MSN RN CNS WTA Entered By: Midge Aver on 01/29/2023 14:08:53 [name], [name] ([id number]) [id number] Page 3 of 7 -------------------------------------------------------------------------------- Encounter Discharge Information Details Patient Name: Date of Service: [name], Megan [name]. 01/29/2023 1:15 PM Medical Record Number: [id number] Patient Account Number: [id number] Date of Birth/Sex: Treating RN: 04-03-1941 (82 y.o. [name] Primary Care Cheetara Hoge: Dorothey Baseman Other Clinician: Referring Ayham Word: Treating Taiten Brawn/Extender: Jannifer Rodney in Treatment: 11 Encounter Discharge Information Items Post Procedure Vitals Discharge Condition: Stable Temperature (F):  97.5 Ambulatory Status: Ambulatory Pulse (bpm): 81 Discharge Destination: Home Respiratory Rate (breaths/min): 16 Transportation: Private Auto Blood Pressure (mmHg): 153/74 Accompanied By: self Schedule Follow-up Appointment: Yes Clinical Summary of Care: Electronic Signature(s) Signed: 01/29/2023 4:52:35 PM By: Midge Aver MSN RN CNS WTA Previous Signature: 01/29/2023 1:35:13 PM Version By: Midge Aver MSN RN CNS WTA Entered By: Midge Aver on 01/29/2023 14:15:49 -------------------------------------------------------------------------------- Lower Extremity Assessment Details Patient Name: Date of Service: [name] ES, PA [name]. 01/29/2023 1:15 PM Medical Record Number: [id number] Patient Account Number: [id number] Date of Birth/Sex: Treating RN: 17-Dec-1940 (82 y.o. [name] Primary Care Tysen Roesler: Dorothey Baseman Other Clinician: Referring Avantae Bither: Treating Jermya Dowding/Extender: Jannifer Rodney in Treatment: 11 Electronic Signature(s) Signed: 01/29/2023 1:31:36 PM By: Midge Aver MSN RN CNS WTA Entered By: Midge Aver on 01/29/2023 13:31:36 -------------------------------------------------------------------------------- Multi Wound Chart Details Patient Name: Date of Service: [name] ES, PA [name]. 01/29/2023 1:15 PM Medical Record Number: [id number] Patient Account Number: [id number] Date of Birth/Sex: Treating RN: 22-Jan-1941 (82 y.o. [name] Primary Care [place] Gipe: Dorothey Baseman Other Clinician: Referring Icy Fuhrmann: Treating Gennie Eisinger/Extender: Jannifer Rodney in Treatment: 11 Vital Signs Height(in): 68 Pulse(bpm): 81 Weight(lbs): 104 Blood Pressure(mmHg): 153/74 Body Mass Index(BMI): 15.8 Temperature(F): 97.5 Respiratory Rate(breaths/min): 16 [1:Photos:] [N/A:N/A] Right Gluteus N/A N/A Wound Location: Pressure Injury N/A N/A Wounding Event: Trauma, Other N/A N/A Primary Etiology: [name], [name] ([id number])  [id number] Page 4 of 7 Anemia, History of pressure wounds, N/A N/A Comorbid History: Rheumatoid Arthritis, Osteoarthritis 09/04/2022 N/A N/A Date Acquired: 11 N/A N/A Weeks of Treatment: Open N/A N/A Wound Status: No N/A N/A Wound Recurrence: 0.3x0.3x2.2 N/A N/A Measurements [name] x W x D (cm) 0.071 N/A N/A A (cm) : rea 0.156 N/A N/A Volume (cm) : 98.70% N/A N/A % Reduction in Area: 90.50% N/A N/A % Reduction in Volume: Full Thickness Without Exposed N/A N/A Classification: Support Structures  Medium N/A N/A Exudate Amount: Serosanguineous N/A N/A Exudate Type: red, brown N/A N/A Exudate Color: Treatment Notes Wound #1 (Gluteus) Wound Laterality: Right Cleanser Wound Cleanser Discharge Instruction: Wash your hands with soap and water. Remove old dressing, discard into plastic bag and place into trash. Cleanse the wound with Wound Cleanser prior to applying a clean dressing using gauze sponges, not tissues or cotton balls. Do not scrub or use excessive force. Pat dry using gauze sponges, not tissue or cotton balls. Peri-Wound Care Topical Primary Dressing Hydrofera Blue Classic Foam Rope Dressing, 9x6 (mm/in) Discharge Instruction: cut in half and insert the half piece, do not cut length wise into to two thin pieces, simply cut in half width wise. Secondary Dressing (BORDER) Zetuvit Plus SILICONE BORDER Dressing 4x4 (in/in) Discharge Instruction: Please do not put silicone bordered dressings under wraps. Use non-bordered dressing only. Secured With Compression Wrap Compression Stockings Facilities manager) Signed: 01/29/2023 4:52:35 PM By: Midge Aver MSN RN CNS WTA Previous Signature: 01/29/2023 1:31:46 PM Version By: Midge Aver MSN RN CNS WTA Entered By: Midge Aver on 01/29/2023 14:06:59 -------------------------------------------------------------------------------- Multi-Disciplinary Care Plan Details Patient Name: Date  of Service: [name] ES, PA [name]. 01/29/2023 1:15 PM Medical Record Number: [id number] Patient Account Number: [id number] Date of Birth/Sex: Treating RN: 14-May-1941 (82 y.o. [name] Primary Care Brandun Pinn: Dorothey Baseman Other Clinician: Referring Koby Pickup: Treating Rhett Najera/Extender: Jannifer Rodney in Treatment: 11 Active Inactive Wound/Skin Impairment Nursing Diagnoses: Impaired tissue integrity Knowledge deficit related to smoking impact on wound healing Knowledge deficit related to ulceration/compromised skin integrity Goals: Patient/caregiver will verbalize understanding of skin care regimen [name], [name] ([id number]) [id number] Page 5 of 7 Date Initiated: 11/13/2022 Date Inactivated: 11/30/2022 Target Resolution Date: 11/08/2022 Goal Status: Met Ulcer/skin breakdown will have a volume reduction of 30% by week 4 Date Initiated: 11/13/2022 Date Inactivated: 12/21/2022 Target Resolution Date: 12/06/2022 Goal Status: Unmet Unmet Reason: comorbidities Ulcer/skin breakdown will have a volume reduction of 50% by week 8 Date Initiated: 11/13/2022 Date Inactivated: 01/04/2023 Target Resolution Date: 01/03/2023 Goal Status: Unmet Unmet Reason: comorbidities Ulcer/skin breakdown will have a volume reduction of 80% by week 12 Date Initiated: 11/13/2022 Date Inactivated: 01/29/2023 Target Resolution Date: 01/31/2023 Goal Status: Met Ulcer/skin breakdown will heal within 14 weeks Date Initiated: 11/13/2022 Target Resolution Date: 02/14/2023 Goal Status: Active Interventions: Assess patient/caregiver ability to obtain necessary supplies Assess ulceration(s) every visit Provide education on ulcer and skin care Treatment Activities: Skin care regimen initiated : 11/08/2022 Notes: Electronic Signature(s) Signed: 01/29/2023 1:33:31 PM By: Midge Aver MSN RN CNS WTA Entered By: Midge Aver on 01/29/2023  13:33:30 -------------------------------------------------------------------------------- Pain Assessment Details Patient Name: Date of Service: [name] ES, PA [name]. 01/29/2023 1:15 PM Medical Record Number: [id number] Patient Account Number: [id number] Date of Birth/Sex: Treating RN: [birth year] (82 y.o. [name] Primary Care Sharman Garrott: Dorothey Baseman Other Clinician: Referring Riti Rollyson: Treating Hoda Hon/Extender: Jannifer Rodney in Treatment: 11 Active Problems Location of Pain Severity and Description of Pain Patient Has Paino No Site Locations Pain Management and Medication Current Pain Management: Electronic Signature(s) Signed: 01/29/2023 4:52:35 PM By: Midge Aver MSN RN CNS WTA Entered By: Midge Aver on 01/29/2023 13:31:29 [name], [name] ([id number]) [id number] Page 6 of 7 -------------------------------------------------------------------------------- Patient/Caregiver Education Details Patient Name: Date of Service: [name], [place] 6/11/2024andnbsp1:15 PM Medical Record Number: [id number] Patient Account Number: [id number] Date of Birth/Gender: Treating RN: June 25, 1941 (82 y.o. [name] Primary Care Physician: Dorothey Baseman Other Clinician:  Referring Physician: Treating Physician/Extender: Jannifer Rodney in Treatment: 11 Education Assessment Education Provided To: Patient Education Topics Provided Wound/Skin Impairment: Handouts: Caring for Your Ulcer Methods: Explain/Verbal Responses: State content correctly Electronic Signature(s) Signed: 01/29/2023 4:52:35 PM By: Midge Aver MSN RN CNS WTA Entered By: Midge Aver on 01/29/2023 13:33:41 -------------------------------------------------------------------------------- Wound Assessment Details Patient Name: Date of Service: [name] ES, PA [name]. 01/29/2023 1:15 PM Medical Record Number: [id number] Patient Account Number:  [id number] Date of Birth/Sex: Treating RN: 01-30-1941 (82 y.o. [name] Primary Care Amiir Heckard: Dorothey Baseman Other Clinician: Referring Jazleen Robeck: Treating Aowyn Rozeboom/Extender: Jannifer Rodney in Treatment: 11 Wound Status Wound Number: 1 Primary Trauma, Other Etiology: Wound Location: Right Gluteus Wound Status: Open Wounding Event: Pressure Injury Comorbid Anemia, History of pressure wounds, Rheumatoid Arthritis, Date Acquired: 09/04/2022 History: Osteoarthritis Weeks Of Treatment: 11 Clustered Wound: No Photos Wound Measurements Length: (cm) 0.3 Width: (cm) 0.3 Depth: (cm) 2.2 Area: (cm) 0.071 Volume: (cm) 0.156 % Reduction in Area: 98.7% % Reduction in Volume: 90.5% Wound Description Classification: Full Thickness Without Exposed Support Structures Exudate Amount: Medium Exudate Type: Serosanguineous [name], [name] ([id number]) Exudate Color: red, brown [id number] Page 7 of 7 Treatment Notes Wound #1 (Gluteus) Wound Laterality: Right Cleanser Wound Cleanser Discharge Instruction: Wash your hands with soap and water. Remove old dressing, discard into plastic bag and place into trash. Cleanse the wound with Wound Cleanser prior to applying a clean dressing using gauze sponges, not tissues or cotton balls. Do not scrub or use excessive force. Pat dry using gauze sponges, not tissue or cotton balls. Peri-Wound Care Topical Primary Dressing Hydrofera Blue Classic Foam Rope Dressing, 9x6 (mm/in) Discharge Instruction: cut in half and insert the half piece, do not cut length wise into to two thin pieces, simply cut in half width wise. Secondary Dressing (BORDER) Zetuvit Plus SILICONE BORDER Dressing 4x4 (in/in) Discharge Instruction: Please do not put silicone bordered dressings under wraps. Use non-bordered dressing only. Secured With Compression Wrap Compression Stockings Facilities manager) Signed:  01/29/2023 4:52:35 PM By: Midge Aver MSN RN CNS WTA Entered By: Midge Aver on 01/29/2023 13:28:08 -------------------------------------------------------------------------------- Vitals Details Patient Name: Date of Service: [name] ES, PA [name]. 01/29/2023 1:15 PM Medical Record Number: [id number] Patient Account Number: [id number] Date of Birth/Sex: Treating RN: October 08, 1940 (82 y.o. [name] Primary Care Arrie Borrelli: Dorothey Baseman Other Clinician: Referring Jahmeir Geisen: Treating Danielle Lento/Extender: Jannifer Rodney in Treatment: 11 Vital Signs Time Taken: 13:18 Temperature (F): 97.5 Height (in): 68 Pulse (bpm): 81 Weight (lbs): 104 Respiratory Rate (breaths/min): 16 Body Mass Index (BMI): 15.8 Blood Pressure (mmHg): 153/74 Reference Range: 80 - 120 mg / dl Electronic Signature(s) Signed: 01/29/2023 4:52:35 PM By: Midge Aver MSN RN CNS WTA Entered By: Midge Aver on 01/29/2023 13:31:24

## 2023-02-05 DIAGNOSIS — M0579 Rheumatoid arthritis with rheumatoid factor of multiple sites without organ or systems involvement: Secondary | ICD-10-CM | POA: Diagnosis not present

## 2023-02-08 ENCOUNTER — Encounter: Payer: PPO | Admitting: Physician Assistant

## 2023-02-08 DIAGNOSIS — L98413 Non-pressure chronic ulcer of buttock with necrosis of muscle: Secondary | ICD-10-CM | POA: Diagnosis not present

## 2023-02-08 DIAGNOSIS — S31819A Unspecified open wound of right buttock, initial encounter: Secondary | ICD-10-CM | POA: Diagnosis not present

## 2023-02-08 DIAGNOSIS — L89313 Pressure ulcer of right buttock, stage 3: Secondary | ICD-10-CM | POA: Diagnosis not present

## 2023-02-08 NOTE — Progress Notes (Signed)
ROXINE, WHITTINGHILL ([id number]) [id number] Page 1 of 9 Visit Report for 02/08/2023 Arrival Information Details Patient Name: Date of Service: [name], [name] 02/08/2023 9:15 A M Medical Record Number: [id number] Patient Account Number: [id number] Date of Birth/Sex: Treating RN: Jan 22, 1941 (82 y.o. [name] Primary Care Casimira Sutphin: Dorothey Baseman Other Clinician: Referring Brettany Sydney: Treating [place] Chagnon/Extender: Jannifer Rodney in Treatment: 13 Visit Information History Since Last Visit Added or deleted any medications: No Patient Arrived: Ambulatory Any new allergies or adverse reactions: No Arrival Time: 09:11 Had a fall or experienced change in No Accompanied By: self activities of daily living that may affect Transfer Assistance: None risk of falls: Patient Identification Verified: Yes Hospitalized since last visit: No Secondary Verification Process Completed: Yes Has Dressing in Place as Prescribed: Yes Patient Requires Transmission-Based Precautions: No Pain Present Now: No Patient Has Alerts: No Electronic Signature(s) Signed: 02/08/2023 12:55:20 PM By: Angelina Pih Entered By: Angelina Pih on 02/08/2023 09:11:45 -------------------------------------------------------------------------------- Clinic Level of Care Assessment Details Patient Name: Date of Service: [name], [name] 02/08/2023 9:15 A M Medical Record Number: [id number] Patient Account Number: [id number] Date of Birth/Sex: Treating RN: [birth year] (82 y.o. [name] Primary Care Garet Hooton: Dorothey Baseman Other Clinician: Referring Lafaye Mcelmurry: Treating Keiondre Colee/Extender: Jannifer Rodney in Treatment: 13 Clinic Level of Care Assessment Items TOOL 4 Quantity Score []  - 0 Use when only an EandM is performed on FOLLOW-UP visit ASSESSMENTS - Nursing Assessment / Reassessment X- 1 10 Reassessment of Co-morbidities (includes  updates in patient status) X- 1 5 Reassessment of Adherence to Treatment Plan ASSESSMENTS - Wound and Skin A ssessment / Reassessment X - Simple Wound Assessment / Reassessment - one wound 1 5 []  - 0 Complex Wound Assessment / Reassessment - multiple wounds Canepa, Kimia L ([id number]) [id number] Page 2 of 9 []  - 0 Dermatologic / Skin Assessment (not related to wound area) ASSESSMENTS - Focused Assessment []  - 0 Circumferential Edema Measurements - multi extremities []  - 0 Nutritional Assessment / Counseling / Intervention []  - 0 Lower Extremity Assessment (monofilament, tuning fork, pulses) []  - 0 Peripheral Arterial Disease Assessment (using hand held doppler) ASSESSMENTS - Ostomy and/or Continence Assessment and Care []  - 0 Incontinence Assessment and Management []  - 0 Ostomy Care Assessment and Management (repouching, etc.) PROCESS - Coordination of Care X - Simple Patient / Family Education for ongoing care 1 15 []  - 0 Complex (extensive) Patient / Family Education for ongoing care X- 1 10 Staff obtains Chiropractor, Records, T Results / Process Orders est []  - 0 Staff telephones HHA, Nursing Homes / Clarify orders / etc []  - 0 Routine Transfer to another Facility (non-emergent condition) []  - 0 Routine Hospital Admission (non-emergent condition) []  - 0 New Admissions / Manufacturing engineer / Ordering NPWT Apligraf, etc. , []  - 0 Emergency Hospital Admission (emergent condition) X- 1 10 Simple Discharge Coordination []  - 0 Complex (extensive) Discharge Coordination PROCESS - Special Needs []  - 0 Pediatric / Minor Patient Management []  - 0 Isolation Patient Management []  - 0 Hearing / Language / Visual special needs []  - 0 Assessment of Community assistance (transportation, D/C planning, etc.) []  - 0 Additional assistance / Altered mentation []  - 0 Support Surface(s) Assessment (bed, cushion, seat, etc.) INTERVENTIONS - Wound  Cleansing / Measurement X - Simple Wound Cleansing - one wound 1 5 []  - 0 Complex Wound Cleansing - multiple wounds X- 1 5 Wound Imaging (photographs - any number of  wounds) []  - 0 Wound Tracing (instead of photographs) X- 1 5 Simple Wound Measurement - one wound []  - 0 Complex Wound Measurement - multiple wounds INTERVENTIONS - Wound Dressings X - Small Wound Dressing one or multiple wounds 1 10 []  - 0 Medium Wound Dressing one or multiple wounds []  - 0 Large Wound Dressing one or multiple wounds []  - 0 Application of Medications - topical []  - 0 Application of Medications - injection INTERVENTIONS - Miscellaneous []  - 0 External ear exam []  - 0 Specimen Collection (cultures, biopsies, blood, body fluids, etc.) []  - 0 Specimen(s) / Culture(s) sent or taken to Lab for analysis Glas, Juli L ([id number]) [id number] Page 3 of 9 []  - 0 Patient Transfer (multiple staff / Nurse, adult / Similar devices) []  - 0 Simple Staple / Suture removal (25 or less) []  - 0 Complex Staple / Suture removal (26 or more) []  - 0 Hypo / Hyperglycemic Management (close monitor of Blood Glucose) []  - 0 Ankle / Brachial Index (ABI) - do not check if billed separately X- 1 5 Vital Signs Has the patient been seen at the hospital within the last three years: Yes Total Score: 85 Level Of Care: New/Established - Level 3 Electronic Signature(s) Signed: 02/08/2023 12:55:20 PM By: Angelina Pih Entered By: Angelina Pih on 02/08/2023 10:15:25 -------------------------------------------------------------------------------- Encounter Discharge Information Details Patient Name: Date of Service: [name], [name] 02/08/2023 9:15 A M Medical Record Number: [id number] Patient Account Number: [id number] Date of Birth/Sex: Treating RN: 07-26-1941 (82 y.o. [name] Primary Care Raechelle Sarti: Dorothey Baseman Other Clinician: Referring Rider Ermis: Treating  Tricia Oaxaca/Extender: Jannifer Rodney in Treatment: 13 Encounter Discharge Information Items Discharge Condition: Stable Ambulatory Status: Ambulatory Discharge Destination: Home Transportation: Private Auto Accompanied By: self Schedule Follow-up Appointment: Yes Clinical Summary of Care: Electronic Signature(s) Signed: 02/08/2023 10:16:34 AM By: Angelina Pih Entered By: Angelina Pih on 02/08/2023 10:16:34 -------------------------------------------------------------------------------- Lower Extremity Assessment Details Patient Name: Date of Service: [name], [name] 02/08/2023 9:15 A M Medical Record Number: [id number] Patient Account Number: [id number] Date of Birth/Sex: Treating RN: 1941/06/08 (82 y.o. [name] Primary Care Jacquline Terrill: Dorothey Baseman Other Clinician: Referring Albirta Rhinehart: Treating Neisha Hinger/Extender: Rosine Beat Clifford, [place] ([id number]) [id number] Page 4 of 9 Weeks in Treatment: 13 Electronic Signature(s) Signed: 02/08/2023 12:55:20 PM By: Angelina Pih Entered By: Angelina Pih on 02/08/2023 09:20:18 -------------------------------------------------------------------------------- Multi Wound Chart Details Patient Name: Date of Service: [name], [name] 02/08/2023 9:15 A M Medical Record Number: [id number] Patient Account Number: [id number] Date of Birth/Sex: Treating RN: 1941-03-26 (82 y.o. [name] Primary Care Ninamarie Keel: Dorothey Baseman Other Clinician: Referring Jocob Dambach: Treating Aviance Cooperwood/Extender: Jannifer Rodney in Treatment: 13 Vital Signs Height(in): 68 Pulse(bpm): 76 Weight(lbs): 104 Blood Pressure(mmHg): 156/67 Body Mass Index(BMI): 15.8 Temperature(F): 98.1 Respiratory Rate(breaths/min): 18 [1:Photos:] [N/A:N/A] Right Gluteus N/A N/A Wound Location: Pressure Injury N/A N/A Wounding Event: Trauma, Other N/A N/A Primary  Etiology: Anemia, History of pressure wounds, N/A N/A Comorbid History: Rheumatoid Arthritis, Osteoarthritis 09/04/2022 N/A N/A Date Acquired: 13 N/A N/A Weeks of Treatment: Open N/A N/A Wound Status: No N/A N/A Wound Recurrence: 0.2x0.2x3.4 N/A N/A Measurements L x W x D (cm) 0.031 N/A N/A A (cm) : rea 0.107 N/A N/A Volume (cm) : 99.40% N/A N/A % Reduction in Area: 93.50% N/A N/A % Reduction in Volume: Full Thickness Without Exposed N/A N/A Classification: Support Structures Medium N/A N/A Exudate A mount: Serosanguineous N/A N/A Exudate  Type: red, brown N/A N/A Exudate Color: Large (67-100%) N/A N/A Granulation A mount: Pink N/A N/A Granulation Quality: None Present (0%) N/A N/A Necrotic A mount: Fat Layer (Subcutaneous Tissue): No N/A N/A Exposed Structures: Small (1-33%) N/A N/A Epithelialization: Treatment Notes Electronic Signature(s) Andrus, Nygeria L ([id number]) [id number] Page 5 of 9 Signed: 02/08/2023 10:13:31 AM By: Angelina Pih Entered By: Angelina Pih on 02/08/2023 10:13:30 -------------------------------------------------------------------------------- Multi-Disciplinary Care Plan Details Patient Name: Date of Service: [name], [name] 02/08/2023 9:15 A M Medical Record Number: [id number] Patient Account Number: [id number] Date of Birth/Sex: Treating RN: 03/29/1941 (82 y.o. [name] Primary Care Hannan Tetzlaff: Dorothey Baseman Other Clinician: Referring Aslan Montagna: Treating Ritta Hammes/Extender: Jannifer Rodney in Treatment: 13 Active Inactive Wound/Skin Impairment Nursing Diagnoses: Impaired tissue integrity Knowledge deficit related to smoking impact on wound healing Knowledge deficit related to ulceration/compromised skin integrity Goals: Patient/caregiver will verbalize understanding of skin care regimen Date Initiated: 11/13/2022 Date Inactivated: 11/30/2022 Target Resolution Date:  11/08/2022 Goal Status: Met Ulcer/skin breakdown will have a volume reduction of 30% by week 4 Date Initiated: 11/13/2022 Date Inactivated: 12/21/2022 Target Resolution Date: 12/06/2022 Goal Status: Unmet Unmet Reason: comorbidities Ulcer/skin breakdown will have a volume reduction of 50% by week 8 Date Initiated: 11/13/2022 Date Inactivated: 01/04/2023 Target Resolution Date: 01/03/2023 Goal Status: Unmet Unmet Reason: comorbidities Ulcer/skin breakdown will have a volume reduction of 80% by week 12 Date Initiated: 11/13/2022 Date Inactivated: 01/29/2023 Target Resolution Date: 01/31/2023 Goal Status: Met Ulcer/skin breakdown will heal within 14 weeks Date Initiated: 11/13/2022 Target Resolution Date: 02/14/2023 Goal Status: Active Interventions: Assess patient/caregiver ability to obtain necessary supplies Assess ulceration(s) every visit Provide education on ulcer and skin care Treatment Activities: Skin care regimen initiated : 11/08/2022 Notes: Electronic Signature(s) Signed: 02/08/2023 10:15:40 AM By: Angelina Pih Entered By: Angelina Pih on 02/08/2023 10:15:40 Goncalves, Joellyn L ([id number]) [id number] Page 6 of 9 -------------------------------------------------------------------------------- Pain Assessment Details Patient Name: Date of Service: [name], [name] 02/08/2023 9:15 A M Medical Record Number: [id number] Patient Account Number: [id number] Date of Birth/Sex: Treating RN: 1941/03/09 (82 y.o. [name] Primary Care Toure Edmonds: Dorothey Baseman Other Clinician: Referring Rhanda Lemire: Treating Kaedynce Tapp/Extender: Jannifer Rodney in Treatment: 13 Active Problems Location of Pain Severity and Description of Pain Patient Has Paino No Site Locations Rate the pain. Current Pain Level: 0 Pain Management and Medication Current Pain Management: Electronic Signature(s) Signed: 02/08/2023 12:55:20 PM By: Angelina Pih Entered By: Angelina Pih on 02/08/2023 09:12:04 -------------------------------------------------------------------------------- Patient/Caregiver Education Details Patient Name: Date of Service: [name], [name] 6/21/2024andnbsp9:15 A M Medical Record Number: [id number] Patient Account Number: [id number] Date of Birth/Gender: Treating RN: 21-Jan-1941 (82 y.o. [name] Primary Care Physician: Dorothey Baseman Other Clinician: Referring Physician: Treating Physician/Extender: Jannifer Rodney in Treatment: [address], Airabella L ([id number]) [id number] Page 7 of 9 Education Assessment Education Provided To: Patient Education Topics Provided Wound/Skin Impairment: Handouts: Caring for Your Ulcer Methods: Explain/Verbal Responses: State content correctly Electronic Signature(s) Signed: 02/08/2023 12:55:20 PM By: Angelina Pih Entered By: Angelina Pih on 02/08/2023 10:15:51 -------------------------------------------------------------------------------- Wound Assessment Details Patient Name: Date of Service: [name], [name] 02/08/2023 9:15 A M Medical Record Number: [id number] Patient Account Number: [id number] Date of Birth/Sex: Treating RN: Jan 09, 1941 (82 y.o. [name] Primary Care [place] Duerst: Dorothey Baseman Other Clinician: Referring Tiburcio Linder: Treating Joevon Holliman/Extender: Jannifer Rodney in Treatment: 13 Wound Status Wound Number: 1 Primary Trauma, Other Etiology: Wound Location:  Right Gluteus Wound Status: Open Wounding Event: Pressure Injury Comorbid Anemia, History of pressure wounds, Rheumatoid Arthritis, Date Acquired: 09/04/2022 History: Osteoarthritis Weeks Of Treatment: 13 Clustered Wound: No Photos Wound Measurements Length: (cm) 0.2 Width: (cm) 0.2 Depth: (cm) 3.4 Area: (cm) 0.031 Volume: (cm) 0.107 % Reduction in Area: 99.4% % Reduction in Volume:  93.5% Epithelialization: Small (1-33%) Tunneling: No Undermining: No Wound Description Classification: Full Thickness Without Exposed Suppor Exudate Amount: Medium Exudate Type: Serosanguineous Lahmann, Wilhelmena L ([id number]) Exudate Color: red, brown t Structures Foul Odor After Cleansing: No Slough/Fibrino No [id number].pdf Page 8 of 9 Wound Bed Granulation Amount: Large (67-100%) Exposed Structure Granulation Quality: Pink Fat Layer (Subcutaneous Tissue) Exposed: No Necrotic Amount: None Present (0%) Treatment Notes Wound #1 (Gluteus) Wound Laterality: Right Cleanser Wound Cleanser Discharge Instruction: Wash your hands with soap and water. Remove old dressing, discard into plastic bag and place into trash. Cleanse the wound with Wound Cleanser prior to applying a clean dressing using gauze sponges, not tissues or cotton balls. Do not scrub or use excessive force. Pat dry using gauze sponges, not tissue or cotton balls. Peri-Wound Care Topical Primary Dressing Hydrofera Blue Classic Foam Rope Dressing, 9x6 (mm/in) Discharge Instruction: cut into 1/4 pieces 5 cm in length, leaving a tail out for removal. Secondary Dressing (BORDER) Zetuvit Plus SILICONE BORDER Dressing 4x4 (in/in) Discharge Instruction: Please do not put silicone bordered dressings under wraps. Use non-bordered dressing only. Secured With Compression Wrap Compression Stockings Add-Ons Electronic Signature(s) Signed: 02/08/2023 12:55:20 PM By: Angelina Pih Entered By: Angelina Pih on 02/08/2023 09:20:10 -------------------------------------------------------------------------------- Vitals Details Patient Name: Date of Service: [name], [name] 02/08/2023 9:15 A M Medical Record Number: [id number] Patient Account Number: [id number] Date of Birth/Sex: Treating RN: 14-Jul-1941 (82 y.o. [name] Primary Care Ladarrious Kirksey: Dorothey Baseman Other Clinician: Referring  Pankaj Haack: Treating Azyiah Bo/Extender: Jannifer Rodney in Treatment: 13 Vital Signs Time Taken: 09:11 Temperature (F): 98.1 Height (in): 68 Pulse (bpm): 76 Weight (lbs): 104 Respiratory Rate (breaths/min): 18 Body Mass Index (BMI): 15.8 Blood Pressure (mmHg): 156/67 Reference Range: 80 - 120 mg / dl Electronic Signature(s) Signed: 02/08/2023 12:55:20 PM By: Angelina Pih Entered By: Angelina Pih on 02/08/2023 09:11:59 Hopson, Maylee L ([id number]) [id number] Page 9 of 9

## 2023-02-08 NOTE — Progress Notes (Addendum)
HAILLE, PARDI ([id number]) [id number] Page 1 of 7 Visit Report for 02/08/2023 Chief Complaint Document Details Patient Name: Date of Service: [name] ES, [name] 02/08/2023 9:15 A M Medical Record Number: [id number] Patient Account Number: [id number] Date of Birth/Sex: Treating RN: 1941/02/22 (82 y.o. [name] Primary Care Provider: Dorothey Baseman Other Clinician: Referring Provider: Treating Provider/Extender: Jannifer Rodney in Treatment: 13 Information Obtained from: Patient Chief Complaint Right gluteal contusion Electronic Signature(s) Signed: 02/08/2023 9:30:57 AM By: Allen Derry PA-C Entered By: Allen Derry on 02/08/2023 09:30:56 -------------------------------------------------------------------------------- HPI Details Patient Name: Date of Service: GRA Megan Balloon, [name] 02/08/2023 9:15 A M Medical Record Number: [id number] Patient Account Number: [id number] Date of Birth/Sex: Treating RN: January 24, 1941 (82 y.o. [name] Primary Care Provider: Dorothey Baseman Other Clinician: Referring Provider: Treating Provider/Extender: Jannifer Rodney in Treatment: 13 History of Present Illness HPI Description: 11-08-2022 upon evaluation today patient presents for initial evaluation here in our clinic concerning an issue which occurred when she had a fall hitting her right gluteal region and this was actually on 09-04-2022. Since that time she has had an area of eschar as well as an area that was deeper that has been present she has had a hard time getting this to close. Subsequently I do believe this was likely a hematoma that occurred as result of contusion and that is why this is probably not healing as quickly as it needs to we also need to get the necrotic tissue off of this wound bed. Patient does not have any major medical problems. She actually is a very healthy 82 year old. 11-16-2022 upon evaluation  today patient appears to be doing better in regard to her wound although a lot of the necrotic tissue started to loosen up more we got have to definitely perform some debridement today. Fortunately I do not see any evidence of infection locally nor systemically which is great news. 11-23-2022 upon evaluation today patient appears to be doing poorly currently in regard to her wound which actually is a bit deeper. Nonetheless I think that this is something that is showing signs of the need for some sharp debridement she has an area of skin connecting where we cannot properly packed the wound we need to remove this and I discussed that with her today. I am going to need to numb her for this we will use lidocaine in order to do that so that make sure we do not cause any pain or problems. Fortunately I do not see any signs of active infection locally nor systemically at this time which is great news. 11-30-2022 upon evaluation today patient appears to be doing well currently in regard to her wound. She has been tolerating the dressing changes without complication. Fortunately there does not appear to be any signs of active infection locally nor systemically at this time. I think that the procedure last week Ritter, Zyla L ([id number]) [phone].pdf Page 2 of 7 clearing away the area of tissue between the 2 openings has made a dramatic benefit for her. 12-07-2022 upon evaluation today patient appears to be doing decently well in regard to her wounds. She has been tolerating the dressing changes without complication. Fortunately there does not appear to be any signs of active infection at this time which is great news. No fevers, chills, nausea, vomiting, or diarrhea. 12-14-2022 upon evaluation today patient appears to be doing well currently in regard to her wound. She has been tolerating the  dressing changes without complication. Fortunately there does not appear to be any signs of  active infection locally nor systemically which is great news. No fevers, chills, nausea, vomiting, or diarrhea. 12-21-2022 upon evaluation today patient appears to be doing well currently in regard to her wound. There is still is depth here but this seems to be a little bit less than last time this is good news. Fortunately I do not see any signs of active infection locally or systemically at this time which is excellent. 12-28-2022 upon evaluation today patient appears to be doing well currently in regard to her wound although the wound VAC did not have any foam whatsoever packed into the base of the wound. Fortunately there does not appear to be any signs of infection but again we have to have something to pack down into this area. At this point Hydrofera Blue rope is probably can be our best option. I discussed that with the patient today. With that being said working to send orders to have the [employer]	[place] rope in first and then the wound VAC applied over top of. Nonetheless why they were doing it where they would just put in a little bit [place] not even really doing what needs to be done from a bridge standpoint coupled with just a little time size area foam over the wound is really doing absolutely nothing for this patient. 01-04-2023 upon evaluation today patient appears to be doing well currently in regard to her wound. She has been tolerating the dressing changes without complication. We did switch after by phone call with the home health from the wound VAC due to the fact that she was not keeping this in place. She subsequently was pulling the wound VAC off for disconnecting it due to the fact that it was not comfortable for her and making a lot of noise. Nonetheless it was doing her noted. 01-18-2023 upon evaluation today patient's wound is actually showing signs of doing about the same at this point. Fortunately she does not appear to be showing any signs of active infection locally nor  systemically which is great news. With that being said the wound externally is try to close internally we still have a bit of space here. 01-29-2020 upon evaluation today patient appears to be doing well currently in regard to her wound which is actually showing signs of improvement. I am very pleased in that regard. I do not see any evidence of active infection locally or systemically which is great news. 02-08-2023 upon evaluation patient actually appears to be doing somewhat better in regard to the size of the wound though she still does have some depth. Will try to get this to feeling we are down to just a quarter size of the Hydrofera Blue rope which is actually pretty good and I am very pleased with where things stand today. I do think that she will likely need to continue with the Hydrofera Blue rope but nonetheless I think that this is moving in the right direction which is great news. Electronic Signature(s) Signed: 02/08/2023 1:37:39 PM By: Allen Derry PA-C Entered By: Allen Derry on 02/08/2023 13:37:39 -------------------------------------------------------------------------------- Physical Exam Details Patient Name: Date of Service: GRA Megan Balloon, [name] 02/08/2023 9:15 A M Medical Record Number: [id number] Patient Account Number: [id number] Date of Birth/Sex: Treating RN: 08-Mar-1941 (82 y.o. [name] Primary Care Provider: Dorothey Baseman Other Clinician: Referring Provider: Treating Provider/Extender: Jannifer Rodney in Treatment: 13 Constitutional Well-nourished and well-hydrated  in no acute distress. Respiratory normal breathing without difficulty. Psychiatric this patient is able to make decisions and demonstrates good insight into disease process. Alert and Oriented x 3. pleasant and cooperative. Notes Upon inspection patient's wound bed actually showed signs again of improvement this is a very slow improvement but nonetheless is making progress  here which is great news. Fortunately I do not see any evidence of infection locally nor systemically which is great news and in general I think they were moving appropriately in the right direction. I am hopeful we will be able to get this completely healed soon already her pain is significantly improved compared to what it used to be. Electronic Signature(s) Signed: 02/08/2023 1:38:24 PM By: Jonni Sanger, Signed: 02/08/2023 1:38:24 PM By: Meredith Mody ([id number]) [id number] Page 3 of 7 Entered By: Allen Derry on 02/08/2023 13:38:23 -------------------------------------------------------------------------------- Physician Orders Details Patient Name: Date of Service: [name] ES, [name] 02/08/2023 9:15 A M Medical Record Number: [id number] Patient Account Number: [id number] Date of Birth/Sex: Treating RN: 02-18-1941 (82 y.o. 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 Home Health Home Health Company: - Wound vac discontinued [facility] [phone] Fax, Rep Alfonzo Beers [phone], Nurse Haynes Kerns [phone] [facility] for wound care. May utilize formulary equivalent dressing for wound treatment orders unless otherwise specified. Home Health Nurse may visit PRN to address patients wound care needs. Scheduled days for dressing changes to be completed; exception, patient has scheduled wound care visit that day. **Please direct any NON-WOUND related issues/requests for orders to patient's Primary Care Physician. **If  current dressing causes regression in wound condition, may D/C ordered dressing product/s and apply Normal Saline Moist Dressing daily until next Wound Healing Center or Other MD appointment. **Notify Wound Healing Center of regression in wound condition at [phone]. Bathing/ Shower/ Hygiene Clean wound with Normal Saline or wound cleanser. No tub bath. Off-Loading Turn and reposition every 2 hours Additional Orders / Instructions Follow Nutritious Diet and Increase Protein Intake Wound Treatment Wound #1 - Gluteus Wound Laterality: Right Cleanser: Wound Cleanser 3 x Per Week/30 Days Discharge Instructions: Wash your hands with soap and water. Remove old dressing, discard into plastic bag and place into trash. Cleanse the wound with Wound Cleanser prior to applying a clean dressing using gauze sponges, not tissues or cotton balls. Do not scrub or use excessive force. Pat dry using gauze sponges, not tissue or cotton balls. Prim Dressing: Hydrofera Blue Classic Foam Rope Dressing, 9x6 (mm/in) 3 x Per Week/30 Days ary Discharge Instructions: cut into 1/4 pieces 5 cm in length, leaving a tail out for removal. Secondary Dressing: (BORDER) Zetuvit Plus SILICONE BORDER Dressing 4x4 (in/in) 3 x Per Week/30 Days Discharge Instructions: Please do not put silicone bordered dressings under wraps. Use non-bordered dressing only. Electronic Signature(s) Signed: 02/08/2023 12:55:20 PM By: Angelina Pih Signed: 02/08/2023 1:44:40 PM By: Allen Derry PA-C Entered By: Angelina Pih on 02/08/2023 10:15:04 Hambly, Brianne L ([id number]) [id number] Page 4 of 7 -------------------------------------------------------------------------------- Problem List Details Patient Name: Date of Service: [name], [name] 02/08/2023 9:15 A M Medical Record Number: [id number] Patient Account Number: [id number] Date of Birth/Sex:  Treating RN: [birth year] (82 y.o. 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 02/08/2023 9:15 A M Medical Record Number: [id number] Patient Account Number: [id number] Date of Birth/Sex: Treating RN: [birth year] (82 y.o. [name] Primary Care Provider: Dorothey Baseman Other Clinician: Referring Provider: Treating Provider/Extender: Jannifer Rodney in Treatment: 13 Subjective Chief Complaint Information obtained from Patient Right gluteal contusion KAYELYN, LEMON ([id number]) [id number] Page 5 of 7 History of Present Illness (HPI) 11-08-2022 upon evaluation today patient presents for initial evaluation here in our clinic concerning an issue which occurred when she had a fall hitting her right gluteal region and this was actually on 09-04-2022. Since that time she has had an area of eschar as well as an area that was deeper that has been present she has had a hard time getting this to close. Subsequently I do believe this was likely a hematoma that occurred as result of contusion and that is why this is probably not healing as quickly as it needs to we also need to get the necrotic tissue off of this wound bed. Patient does not have any major medical problems. She actually is a very healthy 82 year old. 11-16-2022 upon  evaluation today patient appears to be doing better in regard to her wound although a lot of the necrotic tissue started to loosen up more we got have to definitely perform some debridement today. Fortunately I do not see any evidence of infection locally nor systemically which is great news. 11-23-2022 upon evaluation today patient appears to be doing poorly currently in regard to her wound which actually is a bit deeper. Nonetheless I think that this is something that is showing signs of the need for some sharp debridement she has an area of skin connecting where we cannot properly packed the wound we need to remove this and I discussed that with her today. I am going to need to numb her for this we will use lidocaine in order to do that so that make sure we do not cause any pain or problems. Fortunately I do not see any signs of active infection locally nor systemically at this time which is great news. 11-30-2022 upon evaluation today patient appears to be doing well currently in regard to her wound. She has been tolerating the dressing changes without complication. Fortunately there does not appear to be any signs of active infection locally nor systemically at this time. I think that the procedure last week clearing away the area of tissue between the 2 openings has made a dramatic benefit for her. 12-07-2022 upon evaluation today patient appears to be doing decently well in regard to her wounds. She has been tolerating the dressing changes without complication. Fortunately there does not appear to be any signs of active infection at this time which is great news. No fevers, chills, nausea, vomiting, or diarrhea. 12-14-2022 upon evaluation today patient appears to be doing well currently in regard to her wound. She has been tolerating the dressing changes without  complication. Fortunately there does not appear to be any signs of active infection locally nor systemically which is great news. No fevers,  chills, nausea, vomiting, or diarrhea. 12-21-2022 upon evaluation today patient appears to be doing well currently in regard to her wound. There is still is depth here but this seems to be a little bit less than last time this is good news. Fortunately I do not see any signs of active infection locally or systemically at this time which is excellent. 12-28-2022 upon evaluation today patient appears to be doing well currently in regard to her wound although the wound VAC did not have any foam whatsoever packed into the base of the wound. Fortunately there does not appear to be any signs of infection but again we have to have something to pack down into this area. At this point Hydrofera Blue rope is probably can be our best option. I discussed that with the patient today. With that being said working to send orders to have the [employer]	[place] rope in first and then the wound VAC applied over top of. Nonetheless why they were doing it where they would just put in a little bit [place] not even really doing what needs to be done from a bridge standpoint coupled with just a little time size area foam over the wound is really doing absolutely nothing for this patient. 01-04-2023 upon evaluation today patient appears to be doing well currently in regard to her wound. She has been tolerating the dressing changes without complication. We did switch after by phone call with the home health from the wound VAC due to the fact that she was not keeping this in place. She subsequently was pulling the wound VAC off for disconnecting it due to the fact that it was not comfortable for her and making a lot of noise. Nonetheless it was doing her noted. 01-18-2023 upon evaluation today patient's wound is actually showing signs of doing about the same at this point. Fortunately she does not appear to be showing any signs of active infection locally nor systemically which is great news. With that being said the wound externally  is try to close internally we still have a bit of space here. 01-29-2020 upon evaluation today patient appears to be doing well currently in regard to her wound which is actually showing signs of improvement. I am very pleased in that regard. I do not see any evidence of active infection locally or systemically which is great news. 02-08-2023 upon evaluation patient actually appears to be doing somewhat better in regard to the size of the wound though she still does have some depth. Will try to get this to feeling we are down to just a quarter size of the Hydrofera Blue rope which is actually pretty good and I am very pleased with where things stand today. I do think that she will likely need to continue with the Hydrofera Blue rope but nonetheless I think that this is moving in the right direction which is great news. Objective Constitutional Well-nourished and well-hydrated in no acute distress. Vitals Time Taken: 9:11 AM, Height: 68 in, Weight: 104 lbs, BMI: 15.8, Temperature: 98.1 F, Pulse: 76 bpm, Respiratory Rate: 18 breaths/min, Blood Pressure: 156/67 mmHg. Respiratory normal breathing without difficulty. Psychiatric this patient is able to make decisions and demonstrates good insight into disease process. Alert and Oriented x 3. pleasant and cooperative. General Notes: Upon inspection patient's wound bed actually showed signs again of improvement this is a very  slow improvement but nonetheless is making progress here which is great news. Fortunately I do not see any evidence of infection locally nor systemically which is great news and in general I think they were moving appropriately in the right direction. I am hopeful we will be able to get this completely healed soon already her pain is significantly improved compared to what it used to be. Integumentary (Hair, Skin) Wound #1 status is Open. Original cause of wound was Pressure Injury. The date acquired was: 09/04/2022. The wound has  been in treatment 13 weeks. The wound is located on the Right Gluteus. The wound measures 0.2cm length x 0.2cm width x 3.4cm depth; 0.031cm^2 area and 0.107cm^3 volume. There is no tunneling or undermining noted. There is a medium amount of serosanguineous drainage noted. There is large (67-100%) pink granulation within the wound bed. There is no necrotic tissue within the wound bed. 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 Home Health: Home Health Company: - Wound vac discontinued [facility] [phone] Fax, Rep Alfonzo Beers [phone], Nurse Haynes Kerns [phone] [facility] for wound care. May utilize formulary equivalent dressing for wound treatment orders unless otherwise specified. Home Health Nurse may visit PRN to address patients wound care needs. Scheduled days for dressing changes to be completed; exception, patient has scheduled wound care visit that day. **Please direct any NON-WOUND related issues/requests for orders to patient's Primary Care Physician. **If current dressing causes regression in wound condition, may D/C ordered dressing product/s and apply Normal Saline Moist Dressing daily until next Wound Healing Center or Other MD appointment. **Notify Wound Healing Center of regression in wound condition at [phone]. Bathing/ Shower/ Hygiene: Clean wound with Normal Saline or wound cleanser. No tub bath. Off-Loading: Turn and reposition every 2 hours Additional Orders / Instructions: Follow Nutritious Diet and Increase Protein Intake WOUND #1: - Gluteus Wound Laterality: Right Cleanser: Wound Cleanser 3 x Per Week/30 Days Discharge Instructions: Wash your hands with soap and water. Remove  old dressing, discard into plastic bag and place into trash. Cleanse the wound with Wound Cleanser prior to applying a clean dressing using gauze sponges, not tissues or cotton balls. Do not scrub or use excessive force. Pat dry using gauze sponges, not tissue or cotton balls. Prim Dressing: Hydrofera Blue Classic Foam Rope Dressing, 9x6 (mm/in) 3 x Per Week/30 Days ary Discharge Instructions: cut into 1/4 pieces 5 cm in length, leaving a tail out for removal. Secondary Dressing: (BORDER) Zetuvit Plus SILICONE BORDER Dressing 4x4 (in/in) 3 x Per Week/30 Days Discharge Instructions: Please do not put silicone bordered dressings under wraps. Use non-bordered dressing only. 1. I would recommend that we have the patient continue to monitor for any evidence of infection or worsening. In general I think that we are making good headway towards complete closure which is great news. 2. I am going to suggest patient should continue with the [facility] followed by the border foam dressing were using the Hydrofera Blue rope specifically. We will see patient back for reevaluation in 1 week here in the clinic. If anything worsens or changes patient will contact our office for additional recommendations. Electronic Signature(s) Signed: 02/08/2023 1:39:11 PM By: Allen Derry PA-C Entered By: Allen Derry on 02/08/2023 13:39:11 -------------------------------------------------------------------------------- SuperBill Details Patient Name: Date of Service: GRA V  ES, [name] 02/08/2023 Medical Record Number: [id number] Patient Account Number: [id number] Date of Birth/Sex: Treating RN: [birth year] (82 y.o. 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

## 2023-02-09 DIAGNOSIS — T8131XA Disruption of external operation (surgical) wound, not elsewhere classified, initial encounter: Secondary | ICD-10-CM | POA: Diagnosis not present

## 2023-02-15 ENCOUNTER — Encounter: Payer: PPO | Admitting: Physician Assistant

## 2023-02-15 DIAGNOSIS — L89310 Pressure ulcer of right buttock, unstageable: Secondary | ICD-10-CM | POA: Diagnosis not present

## 2023-02-15 DIAGNOSIS — L98413 Non-pressure chronic ulcer of buttock with necrosis of muscle: Secondary | ICD-10-CM | POA: Diagnosis not present

## 2023-02-15 NOTE — Progress Notes (Addendum)
[name], [name] ([id number]) [id number] Page 1 of 9 Visit Report for 02/15/2023 Arrival Information Details Patient Name: Date of Service: [name] ES, [place] [name] 02/15/2023 9:30 A M Medical Record Number: [id number] Patient Account Number: [id number] Date of Birth/Sex: Treating RN: 1941/01/27 (82 y.o. Skip Mayer Primary Care Jenaveve Fenstermaker: Dorothey Baseman Other Clinician: Referring Jaidynn Balster: Treating Rolan Wrightsman/Extender: [name] in Treatment: 14 Visit Information History Since Last Visit Added or deleted any medications: No Patient Arrived: Ambulatory Has Dressing in Place as Prescribed: Yes Arrival Time: 09:22 Pain Present Now: No Accompanied By: self Transfer Assistance: None Patient Identification Verified: Yes Secondary Verification Process Completed: Yes Patient Requires Transmission-Based Precautions: No Patient Has Alerts: No Electronic Signature(s) Signed: 02/15/2023 1:28:03 PM By: Elliot Gurney, BSN, RN, CWS, [name] RN, BSN Entered By: Elliot Gurney, BSN, RN, CWS, [name] on 02/15/2023 09:27:02 -------------------------------------------------------------------------------- Clinic Level of Care Assessment Details Patient Name: Date of Service: Megan [name], [name] 02/15/2023 9:30 A M Medical Record Number: [id number] Patient Account Number: [id number] Date of Birth/Sex: Treating RN: 1941-07-01 (82 y.o. Skip Mayer Primary Care Sander Remedios: Dorothey Baseman Other Clinician: Referring Toshie Demelo: Treating Albena Comes/Extender: [name] in Treatment: 14 Clinic Level of Care Assessment Items TOOL 4 Quantity Score []  - 0 Use when only an EandM is performed on FOLLOW-UP visit ASSESSMENTS - Nursing Assessment / Reassessment X- 1 10 Reassessment of Co-morbidities (includes updates in patient status) X- 1 5 Reassessment of Adherence to Treatment Plan ASSESSMENTS - Wound and Skin A ssessment / Reassessment X - Simple Wound Assessment  / Reassessment - one wound 1 5 []  - 0 Complex Wound Assessment / Reassessment - multiple wounds [name], [name] ([id number]) [id number] Page 2 of 9 []  - 0 Dermatologic / Skin Assessment (not related to wound area) ASSESSMENTS - Focused Assessment []  - 0 Circumferential Edema Measurements - multi extremities []  - 0 Nutritional Assessment / Counseling / Intervention []  - 0 Lower Extremity Assessment (monofilament, tuning fork, pulses) []  - 0 Peripheral Arterial Disease Assessment (using hand held doppler) ASSESSMENTS - Ostomy and/or Continence Assessment and Care []  - 0 Incontinence Assessment and Management []  - 0 Ostomy Care Assessment and Management (repouching, etc.) PROCESS - Coordination of Care X - Simple Patient / Family Education for ongoing care 1 15 []  - 0 Complex (extensive) Patient / Family Education for ongoing care X- 1 10 Staff obtains Chiropractor, Records, T Results / Process Orders est []  - 0 Staff telephones HHA, Nursing Homes / Clarify orders / etc []  - 0 Routine Transfer to another Facility (non-emergent condition) []  - 0 Routine Hospital Admission (non-emergent condition) []  - 0 New Admissions / Manufacturing engineer / Ordering NPWT Apligraf, etc. , []  - 0 Emergency Hospital Admission (emergent condition) X- 1 10 Simple Discharge Coordination []  - 0 Complex (extensive) Discharge Coordination PROCESS - Special Needs []  - 0 Pediatric / Minor Patient Management []  - 0 Isolation Patient Management []  - 0 Hearing / Language / Visual special needs []  - 0 Assessment of Community assistance (transportation, D/C planning, etc.) []  - 0 Additional assistance / Altered mentation []  - 0 Support Surface(s) Assessment (bed, cushion, seat, etc.) INTERVENTIONS - Wound Cleansing / Measurement X - Simple Wound Cleansing - one wound 1 5 []  - 0 Complex Wound Cleansing - multiple wounds X- 1 5 Wound Imaging (photographs - any number  of wounds) []  - 0 Wound Tracing (instead of photographs) X- 1 5 Simple Wound Measurement - one wound []  - 0 Complex  Wound Measurement - multiple wounds INTERVENTIONS - Wound Dressings []  - 0 Small Wound Dressing one or multiple wounds X- 1 15 Medium Wound Dressing one or multiple wounds []  - 0 Large Wound Dressing one or multiple wounds []  - 0 Application of Medications - topical []  - 0 Application of Medications - injection INTERVENTIONS - Miscellaneous []  - 0 External ear exam []  - 0 Specimen Collection (cultures, biopsies, blood, body fluids, etc.) []  - 0 Specimen(s) / Culture(s) sent or taken to Lab for analysis [name], [name] ([id number]) [id number] Page 3 of 9 []  - 0 Patient Transfer (multiple staff / Nurse, adult / Similar devices) []  - 0 Simple Staple / Suture removal (25 or less) []  - 0 Complex Staple / Suture removal (26 or more) []  - 0 Hypo / Hyperglycemic Management (close monitor of Blood Glucose) []  - 0 Ankle / Brachial Index (ABI) - do not check if billed separately X- 1 5 Vital Signs Has the patient been seen at the hospital within the last three years: Yes Total Score: 90 Level Of Care: New/Established - Level 3 Electronic Signature(s) Signed: 02/15/2023 1:28:03 PM By: Elliot Gurney, BSN, RN, CWS, [name] RN, BSN Entered By: Elliot Gurney, BSN, RN, CWS, [name] on 02/15/2023 10:10:56 -------------------------------------------------------------------------------- Encounter Discharge Information Details Patient Name: Date of Service: [name] ES, [name] 02/15/2023 9:30 A M Medical Record Number: [id number] Patient Account Number: [id number] Date of Birth/Sex: Treating RN: January 20, 1941 (82 y.o. Skip Mayer Primary Care Altonio Schwertner: Dorothey Baseman Other Clinician: Referring Paulino Cork: Treating Dominque Marlin/Extender: [name] in Treatment: 14 Encounter Discharge Information Items Discharge Condition: Stable Ambulatory Status:  Ambulatory Discharge Destination: Home Transportation: Private Auto Accompanied By: self Schedule Follow-up Appointment: Yes Clinical Summary of Care: Electronic Signature(s) Signed: 02/15/2023 1:28:03 PM By: Elliot Gurney, BSN, RN, CWS, [name] RN, BSN Entered By: Elliot Gurney, BSN, RN, CWS, [name] on 02/15/2023 10:12:29 -------------------------------------------------------------------------------- Lower Extremity Assessment Details Patient Name: Date of Service: Megan [name], [name] 02/15/2023 9:30 A M Medical Record Number: [id number] Patient Account Number: [id number] Date of Birth/Sex: Treating RN: Jan 11, 1941 (82 y.o. Skip Mayer Primary Care Zacary Bauer: Dorothey Baseman Other Clinician: Referring Mayling Aber: Treating Brandonlee Navis/Extender: [name], [name] ([id number]) [id number] Page 4 of 9 Weeks in Treatment: 14 Electronic Signature(s) Signed: 02/15/2023 1:28:03 PM By: Elliot Gurney, BSN, RN, CWS, [name] RN, BSN Entered By: Elliot Gurney, BSN, RN, CWS, [name] on 02/15/2023 09:37:36 -------------------------------------------------------------------------------- Multi Wound Chart Details Patient Name: Date of Service: [name] ES, [name] 02/15/2023 9:30 A M Medical Record Number: [id number] Patient Account Number: [id number] Date of Birth/Sex: Treating RN: 12-27-1940 (82 y.o. Skip Mayer Primary Care Skylan Gift: Dorothey Baseman Other Clinician: Referring Lorina Duffner: Treating Tarry Blayney/Extender: [name] in Treatment: 14 Vital Signs Height(in): 68 Pulse(bpm): 71 Weight(lbs): 104 Blood Pressure(mmHg): 165/75 Body Mass Index(BMI): 15.8 Temperature(F): 97.9 Respiratory Rate(breaths/min): 18 [1:Photos:] [N/A:N/A] Right Gluteus N/A N/A Wound Location: Pressure Injury N/A N/A Wounding Event: Trauma, Other N/A N/A Primary Etiology: Anemia, History of pressure wounds, N/A N/A Comorbid History: Rheumatoid Arthritis, Osteoarthritis 09/04/2022 N/A  N/A Date Acquired: 14 N/A N/A Weeks of Treatment: Open N/A N/A Wound Status: No N/A N/A Wound Recurrence: 0.2x0.2x3.2 N/A N/A Measurements [name] x W x D (cm) 0.031 N/A N/A A (cm) : rea 0.101 N/A N/A Volume (cm) : 99.40% N/A N/A % Reduction in Area: 93.90% N/A N/A % Reduction in Volume: Full Thickness Without Exposed N/A N/A Classification: Support Structures Large N/A N/A Exudate A mount: Serosanguineous N/A N/A  Exudate Type: red, brown N/A N/A Exudate Color: None Present (0%) N/A N/A Granulation A mount: None Present (0%) N/A N/A Necrotic A mount: Fat Layer (Subcutaneous Tissue): No N/A N/A Exposed Structures: Small (1-33%) N/A N/A Epithelialization: Unable to visualize wound bed due to N/A N/A Assessment Notes: size and depth of wound. Treatment Notes [name], [name] ([id number]) [id number] Page 5 of 9 Electronic Signature(s) Signed: 02/15/2023 1:28:03 PM By: Elliot Gurney, BSN, RN, CWS, [name] RN, BSN Entered By: Elliot Gurney, BSN, RN, CWS, [name] on 02/15/2023 10:09:45 -------------------------------------------------------------------------------- Multi-Disciplinary Care Plan Details Patient Name: Date of Service: [name] ES, [name] 02/15/2023 9:30 A M Medical Record Number: [id number] Patient Account Number: [id number] Date of Birth/Sex: Treating RN: [birth year] (82 y.o. Skip Mayer Primary Care Diane Mochizuki: Dorothey Baseman Other Clinician: Referring Enyla Lisbon: Treating Travia Onstad/Extender: [name] in Treatment: 14 Active Inactive Wound/Skin Impairment Nursing Diagnoses: Impaired tissue integrity Knowledge deficit related to smoking impact on wound healing Knowledge deficit related to ulceration/compromised skin integrity Goals: Patient/caregiver will verbalize understanding of skin care regimen Date Initiated: 11/13/2022 Date Inactivated: 11/30/2022 Target Resolution Date: 11/08/2022 Goal Status: Met Ulcer/skin breakdown  will have a volume reduction of 30% by week 4 Date Initiated: 11/13/2022 Date Inactivated: 12/21/2022 Target Resolution Date: 12/06/2022 Goal Status: Unmet Unmet Reason: comorbidities Ulcer/skin breakdown will have a volume reduction of 50% by week 8 Date Initiated: 11/13/2022 Date Inactivated: 01/04/2023 Target Resolution Date: 01/03/2023 Goal Status: Unmet Unmet Reason: comorbidities Ulcer/skin breakdown will have a volume reduction of 80% by week 12 Date Initiated: 11/13/2022 Date Inactivated: 01/29/2023 Target Resolution Date: 01/31/2023 Goal Status: Met Ulcer/skin breakdown will heal within 14 weeks Date Initiated: 11/13/2022 Target Resolution Date: 02/14/2023 Goal Status: Active Interventions: Assess patient/caregiver ability to obtain necessary supplies Assess ulceration(s) every visit Provide education on ulcer and skin care Treatment Activities: Skin care regimen initiated : 11/08/2022 Notes: Electronic Signature(s) Signed: 02/15/2023 1:28:03 PM By: Elliot Gurney, BSN, RN, CWS, [name] RN, BSN Entered By: Elliot Gurney, BSN, RN, CWS, [name] on 02/15/2023 10:11:14 [name], [name] ([id number]) [id number] Page 6 of 9 -------------------------------------------------------------------------------- Pain Assessment Details Patient Name: Date of Service: [name] ES, [place] [name] 02/15/2023 9:30 A M Medical Record Number: [id number] Patient Account Number: [id number] Date of Birth/Sex: Treating RN: Oct 25, 1940 (82 y.o. Skip Mayer Primary Care Tanette Chauca: Dorothey Baseman Other Clinician: Referring Sherrina Zaugg: Treating Shamela Haydon/Extender: [name] in Treatment: 14 Active Problems Location of Pain Severity and Description of Pain Patient Has Paino No Site Locations Rate the pain. Current Pain Level: 0 Pain Management and Medication Current Pain Management: Electronic Signature(s) Signed: 02/15/2023 1:28:03 PM By: Elliot Gurney, BSN, RN, CWS, [name] RN, BSN Entered By:  Elliot Gurney, BSN, RN, CWS, [name] on 02/15/2023 59:56:38 -------------------------------------------------------------------------------- Patient/Caregiver Education Details Patient Name: Date of Service: Megan [name], Megan TSY Elbert Ewings 6/28/2024andnbsp9:30 A M Medical Record Number: [id number] Patient Account Number: [id number] Date of Birth/Gender: Treating RN: May 30, 1941 (82 y.o. Skip Mayer Primary Care Physician: Dorothey Baseman Other Clinician: Referring Physician: Treating Physician/Extender: [name] in Treatment: [address], [name] ([id number]) [id number] Page 7 of 9 Education Assessment Education Provided To: Patient Education Topics Provided Wound/Skin Impairment: Handouts: Caring for Your Ulcer, Other: continue wound care as prescribed Methods: Explain/Verbal Responses: State content correctly Electronic Signature(s) Signed: 02/15/2023 1:28:03 PM By: Elliot Gurney, BSN, RN, CWS, [name] RN, BSN Entered By: Elliot Gurney, BSN, RN, CWS, [name] on 02/15/2023 10:11:40 -------------------------------------------------------------------------------- Wound Assessment Details Patient Name: Date of Service: [name] ES, [name] 02/15/2023 9:30  A M Medical Record Number: [id number] Patient Account Number: [id number] Date of Birth/Sex: Treating RN: 02/19/1941 (82 y.o. [name], [name] Primary Care Alexiz Cothran: Dorothey Baseman Other Clinician: Referring Kaniya Trueheart: Treating Pietra Zuluaga/Extender: [name] in Treatment: 14 Wound Status Wound Number: 1 Primary Trauma, Other Etiology: Wound Location: Right Gluteus Wound Status: Open Wounding Event: Pressure Injury Comorbid Anemia, History of pressure wounds, Rheumatoid Arthritis, Date Acquired: 09/04/2022 History: Osteoarthritis Weeks Of Treatment: 14 Clustered Wound: No Photos Wound Measurements Length: (cm) 0.2 Width: (cm) 0.2 Depth: (cm) 3.2 Area: (cm) 0.031 Volume: (cm) 0.101 % Reduction in  Area: 99.4% % Reduction in Volume: 93.9% Epithelialization: Small (1-33%) Tunneling: No Undermining: No Wound Description Classification: Full Thickness Without Exposed Support Exudate Amount: Large Exudate Type: Serosanguineous Mclennan, Keirstyn [name] ([id number]) Exudate Color: red, brown Structures Foul Odor After Cleansing: No Slough/Fibrino No [id number] Page 8 of 9 Wound Bed Granulation Amount: None Present (0%) Exposed Structure Necrotic Amount: None Present (0%) Fat Layer (Subcutaneous Tissue) Exposed: No Assessment Notes Unable to visualize wound bed due to size and depth of wound. Treatment Notes Wound #1 (Gluteus) Wound Laterality: Right Cleanser Wound Cleanser Discharge Instruction: Wash your hands with soap and water. Remove old dressing, discard into plastic bag and place into trash. Cleanse the wound with Wound Cleanser prior to applying a clean dressing using gauze sponges, not tissues or cotton balls. Do not scrub or use excessive force. Pat dry using gauze sponges, not tissue or cotton balls. Peri-Wound Care Topical Primary Dressing Hydrofera Blue Classic Foam Rope Dressing, 9x6 (mm/in) Discharge Instruction: cut into 1/4 pieces 5 cm in length, leaving a tail out for removal. Secondary Dressing (BORDER) Zetuvit Plus SILICONE BORDER Dressing 4x4 (in/in) Discharge Instruction: Please do not put silicone bordered dressings under wraps. Use non-bordered dressing only. Secured With Compression Wrap Compression Stockings Facilities manager) Signed: 02/15/2023 1:28:03 PM By: Elliot Gurney, BSN, RN, CWS, [name] RN, BSN Entered By: Elliot Gurney, BSN, RN, CWS, [name] on 02/15/2023 09:37:21 -------------------------------------------------------------------------------- Vitals Details Patient Name: Date of Service: [name] ES, [name] 02/15/2023 9:30 A M Medical Record Number: [id number] Patient Account Number: [id number] Date of Birth/Sex: Treating  RN: [birth year] (82 y.o. [name], [name] Primary Care Xayvion Shirah: Dorothey Baseman Other Clinician: Referring Claudell Rhody: Treating Rafal Archuleta/Extender: [name] in Treatment: 14 Vital Signs Time Taken: 09:28 Temperature (F): 97.9 Height (in): 68 Pulse (bpm): 71 Weight (lbs): 104 Respiratory Rate (breaths/min): 18 Body Mass Index (BMI): 15.8 Blood Pressure (mmHg): 165/75 Reference Range: 80 - 120 mg / dl Electronic Signature(s) Signed: 02/15/2023 1:28:03 PM By: Elliot Gurney, BSN, RN, CWS, [name] RN, BSN [name], Signed: 02/15/2023 1:28:03 PM By: Elliot Gurney, BSN, RN, CWS, [name] RN, BSN [name] ([id number]) [id number].pdf Page 9 of 9 Entered By: Elliot Gurney, BSN, RN, CWS, [name] on 02/15/2023 09:29:03

## 2023-02-15 NOTE — Progress Notes (Addendum)
MARYCLARE, SCHWARTZMAN ([id number]) [id number] Page 1 of 7 Visit Report for 02/15/2023 Chief Complaint Document Details Patient Name: Date of Service: [name], [name] 02/15/2023 9:30 A M Medical Record Number: [id number] Patient Account Number: [id number] Date of Birth/Sex: Treating RN: [birth year] (82 y.o. Skip Mayer Primary Care Provider: Dorothey Baseman Other Clinician: Referring Provider: Treating Provider/Extender: Jannifer Rodney in Treatment: 14 Information Obtained from: Patient Chief Complaint Right gluteal contusion Electronic Signature(s) Signed: 02/15/2023 9:23:14 AM By: Allen Derry PA-C Entered By: Allen Derry on 02/15/2023 09:23:14 -------------------------------------------------------------------------------- HPI Details Patient Name: Date of Service: [name], [name] 02/15/2023 9:30 A M Medical Record Number: [id number] Patient Account Number: [id number] Date of Birth/Sex: Treating RN: [birth year] (82 y.o. Skip Mayer Primary Care Provider: Dorothey Baseman Other Clinician: Referring Provider: Treating Provider/Extender: Jannifer Rodney in Treatment: 14 History of Present Illness HPI Description: 11-08-2022 upon evaluation today patient presents for initial evaluation here in our clinic concerning an issue which occurred when she had a fall hitting her right gluteal region and this was actually on 09-04-2022. Since that time she has had an area of eschar as well as an area that was deeper that has been present she has had a hard time getting this to close. Subsequently I do believe this was likely a hematoma that occurred as result of contusion and that is why this is probably not healing as quickly as it needs to we also need to get the necrotic tissue off of this wound bed. Patient does not have any major medical problems. She actually is a very healthy 82 year old. 11-16-2022 upon evaluation today  patient appears to be doing better in regard to her wound although a lot of the necrotic tissue started to loosen up more we got have to definitely perform some debridement today. Fortunately I do not see any evidence of infection locally nor systemically which is great news. 11-23-2022 upon evaluation today patient appears to be doing poorly currently in regard to her wound which actually is a bit deeper. Nonetheless I think that this is something that is showing signs of the need for some sharp debridement she has an area of skin connecting where we cannot properly packed the wound we need to remove this and I discussed that with her today. I am going to need to numb her for this we will use lidocaine in order to do that so that make sure we do not cause any pain or problems. Fortunately I do not see any signs of active infection locally nor systemically at this time which is great news. 11-30-2022 upon evaluation today patient appears to be doing well currently in regard to her wound. She has been tolerating the dressing changes without complication. Fortunately there does not appear to be any signs of active infection locally nor systemically at this time. I think that the procedure last week Penaflor, Adine L ([id number]) [phone].pdf Page 2 of 7 clearing away the area of tissue between the 2 openings has made a dramatic benefit for her. 12-07-2022 upon evaluation today patient appears to be doing decently well in regard to her wounds. She has been tolerating the dressing changes without complication. Fortunately there does not appear to be any signs of active infection at this time which is great news. No fevers, chills, nausea, vomiting, or diarrhea. 12-14-2022 upon evaluation today patient appears to be doing well currently in regard to her wound. She has been tolerating the  dressing changes without complication. Fortunately there does not appear to be any signs of active  infection locally nor systemically which is great news. No fevers, chills, nausea, vomiting, or diarrhea. 12-21-2022 upon evaluation today patient appears to be doing well currently in regard to her wound. There is still is depth here but this seems to be a little bit less than last time this is good news. Fortunately I do not see any signs of active infection locally or systemically at this time which is excellent. 12-28-2022 upon evaluation today patient appears to be doing well currently in regard to her wound although the wound VAC did not have any foam whatsoever packed into the base of the wound. Fortunately there does not appear to be any signs of infection but again we have to have something to pack down into this area. At this point Hydrofera Blue rope is probably can be our best option. I discussed that with the patient today. With that being said working to send orders to have the [employer]	[place] rope in first and then the wound VAC applied over top of. Nonetheless why they were doing it where they would just put in a little bit [place] not even really doing what needs to be done from a bridge standpoint coupled with just a little time size area foam over the wound is really doing absolutely nothing for this patient. 01-04-2023 upon evaluation today patient appears to be doing well currently in regard to her wound. She has been tolerating the dressing changes without complication. We did switch after by phone call with the home health from the wound VAC due to the fact that she was not keeping this in place. She subsequently was pulling the wound VAC off for disconnecting it due to the fact that it was not comfortable for her and making a lot of noise. Nonetheless it was doing her noted. 01-18-2023 upon evaluation today patient's wound is actually showing signs of doing about the same at this point. Fortunately she does not appear to be showing any signs of active infection locally nor  systemically which is great news. With that being said the wound externally is try to close internally we still have a bit of space here. 01-29-2020 upon evaluation today patient appears to be doing well currently in regard to her wound which is actually showing signs of improvement. I am very pleased in that regard. I do not see any evidence of active infection locally or systemically which is great news. 02-08-2023 upon evaluation patient actually appears to be doing somewhat better in regard to the size of the wound though she still does have some depth. Will try to get this to feeling we are down to just a quarter size of the Hydrofera Blue rope which is actually pretty good and I am very pleased with where things stand today. I do think that she will likely need to continue with the Hydrofera Blue rope but nonetheless I think that this is moving in the right direction which is great news. 02-15-2023 upon evaluation today patient appears to be doing well currently with regard to her wound which is showing signs of no worsening overall. Also think that there is definitely improvement with regard to her pain and even the external size of the wound but internally this is still quite deep. I do believe that looking into Kerecis would be a possibility at this point. Electronic Signature(s) Signed: 02/15/2023 11:25:56 AM By: Allen Derry PA-C Entered By: Larina Bras,  Leonard Schwartz on 02/15/2023 11:25:56 -------------------------------------------------------------------------------- Physical Exam Details Patient Name: Date of Service: [name], [name] 02/15/2023 9:30 A M Medical Record Number: [id number] Patient Account Number: [id number] Date of Birth/Sex: Treating RN: 1940-12-07 (82 y.o. Skip Mayer Primary Care Provider: Dorothey Baseman Other Clinician: Referring Provider: Treating Provider/Extender: Jannifer Rodney in Treatment: 14 Constitutional Well-nourished and well-hydrated in no  acute distress. Respiratory normal breathing without difficulty. Psychiatric this patient is able to make decisions and demonstrates good insight into disease process. Alert and Oriented x 3. pleasant and cooperative. Notes Upon inspection patient's wound bed actually showed signs of good granulation epithelization at this point. Fortunately I do not see any evidence of infection locally nor systemically which is great news and in general I do believe that we are making headway towards closure but again the patient is very slow as far as healing is concerned I think Kerecis could be an option to try to pack down into this to try to see if we can get this to heal much more effectively and quickly. KENDRIX, SMITHEE ([id number]) [id number] Page 3 of 7 Electronic Signature(s) Signed: 02/15/2023 11:26:24 AM By: Allen Derry PA-C Entered By: Allen Derry on 02/15/2023 11:26:24 -------------------------------------------------------------------------------- Physician Orders Details Patient Name: Date of Service: [name], [name] 02/15/2023 9:30 A M Medical Record Number: [id number] Patient Account Number: [id number] Date of Birth/Sex: Treating RN: [birth year] (82 y.o. 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 Home Health Home Health Company: - [facility] [phone] Fax, Rep Alfonzo Beers [phone], Nurse Haynes Kerns [phone] [facility] for wound care. May utilize formulary equivalent dressing for wound treatment orders unless otherwise  specified. Home Health Nurse may visit PRN to address patients wound care needs. Scheduled days for dressing changes to be completed; exception, patient has scheduled wound care visit that day. **Please direct any NON-WOUND related issues/requests for orders to patient's Primary Care Physician. **If current dressing causes regression in wound condition, may D/C ordered dressing product/s and apply Normal Saline Moist Dressing daily until next Wound Healing Center or Other MD appointment. **Notify Wound Healing Center of regression in wound condition at [phone]. Bathing/ Shower/ Hygiene Clean wound with Normal Saline or wound cleanser. No tub bath. Off-Loading Turn and reposition every 2 hours Additional Orders / Instructions Follow Nutritious Diet and Increase Protein Intake Wound Treatment Wound #1 - Gluteus Wound Laterality: Right Cleanser: Wound Cleanser 3 x Per Week/30 Days Discharge Instructions: Wash your hands with soap and water. Remove old dressing, discard into plastic bag and place into trash. Cleanse the wound with Wound Cleanser prior to applying a clean dressing using gauze sponges, not tissues or cotton balls. Do not scrub or use excessive force. Pat dry using gauze sponges, not tissue or cotton balls. Prim Dressing: Hydrofera Blue Classic Foam Rope Dressing, 9x6 (mm/in) 3 x Per Week/30 Days ary Discharge Instructions: cut into 1/4 pieces 5 cm in length, leaving a tail out for removal. Secondary Dressing: (BORDER) Zetuvit Plus SILICONE BORDER Dressing 4x4 (in/in) 3 x Per Week/30 Days Discharge Instructions: Please do not put silicone bordered dressings under wraps. Use non-bordered dressing only. Electronic Signature(s) Signed: 02/15/2023  1:28:03 PM By: Elliot Gurney, BSN, RN, CWS, [name] RN, BSN Hancock, Juliene Pina ([id number]) [id number] Page 4 of 7 Signed: 02/15/2023 1:40:48 PM By: Allen Derry PA-C Entered By: Elliot Gurney BSN, RN, CWS, [name] on 02/15/2023  10:10:23 -------------------------------------------------------------------------------- Problem List Details Patient Name: Date of Service: [name], [name] 02/15/2023 9:30 A M Medical Record Number: [id number] Patient Account Number: [id number] Date of Birth/Sex: Treating RN: November 22, 1940 (82 y.o. 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 02/15/2023 9:30 A M Medical Record Number: [id number] Patient Account Number: [id number] Date of Birth/Sex: Treating RN: 1941/07/24 (82 y.o. Skip Mayer Primary Care Provider: Dorothey Baseman Other Clinician: Referring Provider: Treating Provider/Extender: Jannifer Rodney in Treatment: [address] Subjective Chief Complaint CIEANNA, YOKOTA ([id number]) [id number] Page 5 of 7 Information obtained from Patient Right gluteal contusion History of Present Illness (HPI) 11-08-2022 upon evaluation today patient presents for initial evaluation here in our clinic concerning an issue which occurred when she had a fall hitting her right gluteal region and this was actually on 09-04-2022. Since that time she has had an area of eschar as well as an area that was deeper that has been present she has had a hard time getting this to close. Subsequently I do  believe this was likely a hematoma that occurred as result of contusion and that is why this is probably not healing as quickly as it needs to we also need to get the necrotic tissue off of this wound bed. Patient does not have any major medical problems. She actually is a very healthy 82 year old. 11-16-2022 upon evaluation today patient appears to be doing better in regard to her wound although a lot of the necrotic tissue started to loosen up more we got have to definitely perform some debridement today. Fortunately I do not see any evidence of infection locally nor systemically which is great news. 11-23-2022 upon evaluation today patient appears to be doing poorly currently in regard to her wound which actually is a bit deeper. Nonetheless I think that this is something that is showing signs of the need for some sharp debridement she has an area of skin connecting where we cannot properly packed the wound we need to remove this and I discussed that with her today. I am going to need to numb her for this we will use lidocaine in order to do that so that make sure we do not cause any pain or problems. Fortunately I do not see any signs of active infection locally nor systemically at this time which is great news. 11-30-2022 upon evaluation today patient appears to be doing well currently in regard to her wound. She has been tolerating the dressing changes without complication. Fortunately there does not appear to be any signs of active infection locally nor systemically at this time. I think that the procedure last week clearing away the area of tissue between the 2 openings has made a dramatic benefit for her. 12-07-2022 upon evaluation today patient appears to be  doing decently well in regard to her wounds. She has been tolerating the dressing changes without complication. Fortunately there does not appear to be any signs of active infection at this time which is great news. No fevers, chills, nausea,  vomiting, or diarrhea. 12-14-2022 upon evaluation today patient appears to be doing well currently in regard to her wound. She has been tolerating the dressing changes without complication. Fortunately there does not appear to be any signs of active infection locally nor systemically which is great news. No fevers, chills, nausea, vomiting, or diarrhea. 12-21-2022 upon evaluation today patient appears to be doing well currently in regard to her wound. There is still is depth here but this seems to be a little bit less than last time this is good news. Fortunately I do not see any signs of active infection locally or systemically at this time which is excellent. 12-28-2022 upon evaluation today patient appears to be doing well currently in regard to her wound although the wound VAC did not have any foam whatsoever packed into the base of the wound. Fortunately there does not appear to be any signs of infection but again we have to have something to pack down into this area. At this point Hydrofera Blue rope is probably can be our best option. I discussed that with the patient today. With that being said working to send orders to have the [employer]	[place] rope in first and then the wound VAC applied over top of. Nonetheless why they were doing it where they would just put in a little bit [place] not even really doing what needs to be done from a bridge standpoint coupled with just a little time size area foam over the wound is really doing absolutely nothing for this patient. 01-04-2023 upon evaluation today patient appears to be doing well currently in regard to her wound. She has been tolerating the dressing changes without complication. We did switch after by phone call with the home health from the wound VAC due to the fact that she was not keeping this in place. She subsequently was pulling the wound VAC off for disconnecting it due to the fact that it was not comfortable for her and making a lot of  noise. Nonetheless it was doing her noted. 01-18-2023 upon evaluation today patient's wound is actually showing signs of doing about the same at this point. Fortunately she does not appear to be showing any signs of active infection locally nor systemically which is great news. With that being said the wound externally is try to close internally we still have a bit of space here. 01-29-2020 upon evaluation today patient appears to be doing well currently in regard to her wound which is actually showing signs of improvement. I am very pleased in that regard. I do not see any evidence of active infection locally or systemically which is great news. 02-08-2023 upon evaluation patient actually appears to be doing somewhat better in regard to the size of the wound though she still does have some depth. Will try to get this to feeling we are down to just a quarter size of the Hydrofera Blue rope which is actually pretty good and I am very pleased with where things stand today. I do think that she will likely need to continue with the Hydrofera Blue rope but nonetheless I think that this is moving in the right direction which is great news. 02-15-2023 upon evaluation today patient appears to be doing well currently with regard to  her wound which is showing signs of no worsening overall. Also think that there is definitely improvement with regard to her pain and even the external size of the wound but internally this is still quite deep. I do believe that looking into Kerecis would be a possibility at this point. Objective Constitutional Well-nourished and well-hydrated in no acute distress. Vitals Time Taken: 9:28 AM, Height: 68 in, Weight: 104 lbs, BMI: 15.8, Temperature: 97.9 F, Pulse: 71 bpm, Respiratory Rate: 18 breaths/min, Blood Pressure: 165/75 mmHg. Respiratory normal breathing without difficulty. Psychiatric this patient is able to make decisions and demonstrates good insight into disease  process. Alert and Oriented x 3. pleasant and cooperative. General Notes: Upon inspection patient's wound bed actually showed signs of good granulation epithelization at this point. Fortunately I do not see any evidence of infection locally nor systemically which is great news and in general I do believe that we are making headway towards closure but again the patient is very slow as far as healing is concerned I think Kerecis could be an option to try to pack down into this to try to see if we can get this to heal much more effectively and quickly. SARAYU, BOYTE ([id number]) [id number] Page 6 of 7 Integumentary (Hair, Skin) Wound #1 status is Open. Original cause of wound was Pressure Injury. The date acquired was: 09/04/2022. The wound has been in treatment 14 weeks. The wound is located on the Right Gluteus. The wound measures 0.2cm length x 0.2cm width x 3.2cm depth; 0.031cm^2 area and 0.101cm^3 volume. There is no tunneling or undermining noted. There is a large amount of serosanguineous drainage noted. There is no granulation within the wound bed. There is no necrotic tissue within the wound bed. General Notes: Unable to visualize wound bed due to size and depth of wound. 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 Home Health: Home Health Company: - [facility] [phone] Fax, Rep Alfonzo Beers [phone], Nurse Haynes Kerns [phone] [facility] for wound care. May utilize formulary equivalent dressing for wound treatment orders unless otherwise specified. Home Health Nurse may visit PRN to address patients wound care needs. Scheduled days for dressing changes to be completed; exception, patient has scheduled wound care visit that day. **Please direct any NON-WOUND  related issues/requests for orders to patient's Primary Care Physician. **If current dressing causes regression in wound condition, may D/C ordered dressing product/s and apply Normal Saline Moist Dressing daily until next Wound Healing Center or Other MD appointment. **Notify Wound Healing Center of regression in wound condition at [phone]. Bathing/ Shower/ Hygiene: Clean wound with Normal Saline or wound cleanser. No tub bath. Off-Loading: Turn and reposition every 2 hours Additional Orders / Instructions: Follow Nutritious Diet and Increase Protein Intake WOUND #1: - Gluteus Wound Laterality: Right Cleanser: Wound Cleanser 3 x Per Week/30 Days Discharge Instructions: Wash your hands with soap and water. Remove old dressing, discard into plastic bag and place into trash. Cleanse the wound with Wound Cleanser prior to applying a clean dressing using gauze sponges, not tissues or cotton balls. Do not scrub or use excessive force. Pat dry using gauze sponges, not tissue or cotton balls. Prim Dressing: Hydrofera Blue Classic Foam Rope Dressing, 9x6 (mm/in) 3 x Per Week/30 Days ary Discharge Instructions: cut into 1/4 pieces 5 cm in length, leaving a tail out for removal.  Secondary Dressing: (BORDER) Zetuvit Plus SILICONE BORDER Dressing 4x4 (in/in) 3 x Per Week/30 Days Discharge Instructions: Please do not put silicone bordered dressings under wraps. Use non-bordered dressing only. 1. I recommend that we have the patient continue to monitor for any evidence of infection or worsening. Overall I do believe that we are making good progress towards complete closure and I am very pleased in that regard. In general I think that we are on the right track. 2. I am going to recommend the [facility] be continued for now on the road form. 3. I am also can recommend that we should look into Kerecis in the micro form to see if we can pack this into the wound to try to get this to healing from  the bottom out I think this will be an ideal way to go at this point and I discussed that with the patient today as well. We will see patient back for reevaluation in 1 week here in the clinic. If anything worsens or changes patient will contact our office for additional recommendations. Electronic Signature(s) Signed: 02/15/2023 11:26:57 AM By: Allen Derry PA-C Entered By: Allen Derry on 02/15/2023 11:26:57 Badami, Kinslie L ([id number]) [id number] Page 7 of 7 -------------------------------------------------------------------------------- SuperBill Details Patient Name: Date of Service: [name], [place] 02/15/2023 Medical Record Number: [id number] Patient Account Number: [id number] Date of Birth/Sex: Treating RN: 1941/01/22 (82 y.o. 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

## 2023-02-19 ENCOUNTER — Ambulatory Visit (INDEPENDENT_AMBULATORY_CARE_PROVIDER_SITE_OTHER): Payer: PPO | Admitting: Podiatry

## 2023-02-19 DIAGNOSIS — M79675 Pain in left toe(s): Secondary | ICD-10-CM

## 2023-02-19 DIAGNOSIS — M79674 Pain in right toe(s): Secondary | ICD-10-CM | POA: Diagnosis not present

## 2023-02-19 DIAGNOSIS — B351 Tinea unguium: Secondary | ICD-10-CM

## 2023-02-19 NOTE — Progress Notes (Signed)
  Subjective:  Patient ID: [name], female    DOB: 03-02-1941,  MRN: [id number]  Chief Complaint  Patient presents with   Nail Problem    Nail trim    82 y.o. female returns for the above complaint.  Patient presents with thickened elongated serving mycotic toenails x 10 mild pain on palpation worse with ambulation is with pressure she would like for me to debride down.  She denies any other acute complaints.  Objective:  There were no vitals filed for this visit. Podiatric Exam: Vascular: dorsalis pedis and posterior tibial pulses are palpable bilateral. Capillary return is immediate. Temperature gradient is WNL. Skin turgor WNL  Sensorium: Normal Semmes Weinstein monofilament test. Normal tactile sensation bilaterally. Nail Exam: Pt has thick disfigured discolored nails with subungual debris noted bilateral entire nail hallux through fifth toenails.  Pain on palpation to the nails. Ulcer Exam: There is no evidence of ulcer or pre-ulcerative changes or infection. Orthopedic Exam: Muscle tone and strength are WNL. No limitations in general ROM. No crepitus or effusions noted.  Skin: No Porokeratosis. No infection or ulcers    Assessment & Plan:   1. Pain due to onychomycosis of toenails of both feet     Patient was evaluated and treated and all questions answered.  Onychomycosis with pain  -Nails palliatively debrided as below. -Educated on self-care  Procedure: Nail Debridement Rationale: pain  Type of Debridement: manual, sharp debridement. Instrumentation: Nail nipper, rotary burr. Number of Nails: 10  Procedures and Treatment: Consent by patient was obtained for treatment procedures. The patient understood the discussion of treatment and procedures well. All questions were answered thoroughly reviewed. Debridement of mycotic and hypertrophic toenails, 1 through 5 bilateral and clearing of subungual debris. No ulceration, no infection noted.  Return Visit-Office  Procedure: Patient instructed to return to the office for a follow up visit 3 months for continued evaluation and treatment.  Nicholes Rough, DPM    No follow-ups on file.

## 2023-03-01 ENCOUNTER — Encounter: Payer: PPO | Attending: Physician Assistant | Admitting: Physician Assistant

## 2023-03-01 DIAGNOSIS — L98413 Non-pressure chronic ulcer of buttock with necrosis of muscle: Secondary | ICD-10-CM | POA: Diagnosis not present

## 2023-03-01 DIAGNOSIS — S300XXA Contusion of lower back and pelvis, initial encounter: Secondary | ICD-10-CM | POA: Insufficient documentation

## 2023-03-01 DIAGNOSIS — M069 Rheumatoid arthritis, unspecified: Secondary | ICD-10-CM | POA: Insufficient documentation

## 2023-03-01 DIAGNOSIS — L89310 Pressure ulcer of right buttock, unstageable: Secondary | ICD-10-CM | POA: Diagnosis not present

## 2023-03-01 NOTE — Progress Notes (Addendum)
[name] ([id number]) [id number] Page 1 of 8 Visit Report for 03/01/2023 Arrival Information Details Patient Name: Date of Service: [name] ES, [place] [name] 03/01/2023 10:00 A M Medical Record Number: [id number] Patient Account Number: [id number] Date of Birth/Sex: Treating RN: 1941/06/18 (82 y.o. [name] Primary Care Kayliegh Boyers: Dorothey Baseman Other Clinician: Referring Carlyle Mcelrath: Treating Ariana Juul/Extender: Jannifer Rodney in Treatment: 16 Visit Information History Since Last Visit Added or deleted any medications: No Patient Arrived: Ambulatory Any new allergies or adverse reactions: No Arrival Time: 10:23 Has Dressing in Place as Prescribed: Yes Accompanied By: self Pain Present Now: No Transfer Assistance: None Patient Identification Verified: Yes Secondary Verification Process Completed: Yes Patient Requires Transmission-Based Precautions: No Patient Has Alerts: No Electronic Signature(s) Signed: 03/04/2023 4:39:16 PM By: Midge Aver MSN RN CNS WTA Entered By: Midge Aver on 03/01/2023 10:24:04 -------------------------------------------------------------------------------- Clinic Level of Care Assessment Details Patient Name: Date of Service: [name], [name] 03/01/2023 10:00 A M Medical Record Number: [id number] Patient Account Number: [id number] Date of Birth/Sex: Treating RN: 26-Sep-1940 (82 y.o. [name] Primary Care Marabella Popiel: Dorothey Baseman Other Clinician: Referring Kylena Mole: Treating Sydny Schnitzler/Extender: Jannifer Rodney in Treatment: 16 Clinic Level of Care Assessment Items TOOL 4 Quantity Score X- 1 0 Use when only an EandM is performed on FOLLOW-UP visit ASSESSMENTS - Nursing Assessment / Reassessment X- 1 10 Reassessment of Co-morbidities (includes updates in patient status) X- 1 5 Reassessment of Adherence to Treatment Plan ASSESSMENTS - Wound and Skin A ssessment /  Reassessment X - Simple Wound Assessment / Reassessment - one wound 1 5 []  - 0 Complex Wound Assessment / Reassessment - multiple wounds [name] ([id number]) [id number] Page 2 of 8 []  - 0 Dermatologic / Skin Assessment (not related to wound area) ASSESSMENTS - Focused Assessment []  - 0 Circumferential Edema Measurements - multi extremities []  - 0 Nutritional Assessment / Counseling / Intervention []  - 0 Lower Extremity Assessment (monofilament, tuning fork, pulses) []  - 0 Peripheral Arterial Disease Assessment (using hand held doppler) ASSESSMENTS - Ostomy and/or Continence Assessment and Care []  - 0 Incontinence Assessment and Management []  - 0 Ostomy Care Assessment and Management (repouching, etc.) PROCESS - Coordination of Care X - Simple Patient / Family Education for ongoing care 1 15 []  - 0 Complex (extensive) Patient / Family Education for ongoing care X- 1 10 Staff obtains Chiropractor, Records, T Results / Process Orders est []  - 0 Staff telephones HHA, Nursing Homes / Clarify orders / etc []  - 0 Routine Transfer to another Facility (non-emergent condition) []  - 0 Routine Hospital Admission (non-emergent condition) []  - 0 New Admissions / Manufacturing engineer / Ordering NPWT Apligraf, etc. , []  - 0 Emergency Hospital Admission (emergent condition) X- 1 10 Simple Discharge Coordination []  - 0 Complex (extensive) Discharge Coordination PROCESS - Special Needs []  - 0 Pediatric / Minor Patient Management []  - 0 Isolation Patient Management []  - 0 Hearing / Language / Visual special needs []  - 0 Assessment of Community assistance (transportation, D/C planning, etc.) []  - 0 Additional assistance / Altered mentation []  - 0 Support Surface(s) Assessment (bed, cushion, seat, etc.) INTERVENTIONS - Wound Cleansing / Measurement X - Simple Wound Cleansing - one wound 1 5 []  - 0 Complex Wound Cleansing - multiple wounds []  -  0 Wound Imaging (photographs - any number of wounds) []  - 0 Wound Tracing (instead of photographs) []  - 0 Simple Wound Measurement - one wound []  -  0 Complex Wound Measurement - multiple wounds INTERVENTIONS - Wound Dressings X - Small Wound Dressing one or multiple wounds 1 10 []  - 0 Medium Wound Dressing one or multiple wounds []  - 0 Large Wound Dressing one or multiple wounds []  - 0 Application of Medications - topical []  - 0 Application of Medications - injection INTERVENTIONS - Miscellaneous []  - 0 External ear exam []  - 0 Specimen Collection (cultures, biopsies, blood, body fluids, etc.) []  - 0 Specimen(s) / Culture(s) sent or taken to Lab for analysis [name] ([id number]) [id number] Page 3 of 8 []  - 0 Patient Transfer (multiple staff / Nurse, adult / Similar devices) []  - 0 Simple Staple / Suture removal (25 or less) []  - 0 Complex Staple / Suture removal (26 or more) []  - 0 Hypo / Hyperglycemic Management (close monitor of Blood Glucose) []  - 0 Ankle / Brachial Index (ABI) - do not check if billed separately X- 1 5 Vital Signs Has the patient been seen at the hospital within the last three years: Yes Total Score: 75 Level Of Care: New/Established - Level 2 Electronic Signature(s) Signed: 03/04/2023 4:39:16 PM By: Midge Aver MSN RN CNS WTA Entered By: Midge Aver on 03/01/2023 10:48:42 -------------------------------------------------------------------------------- Encounter Discharge Information Details Patient Name: Date of Service: [name] ES, [name] 03/01/2023 10:00 A M Medical Record Number: [id number] Patient Account Number: [id number] Date of Birth/Sex: Treating RN: 1941-03-13 (82 y.o. [name] Primary Care Coda Filler: Dorothey Baseman Other Clinician: Referring Daril Warga: Treating Haleigh Desmith/Extender: Jannifer Rodney in Treatment: 16 Encounter Discharge Information Items Discharge Condition:  Stable Ambulatory Status: Ambulatory Discharge Destination: Home Transportation: Other Accompanied By: self Schedule Follow-up Appointment: Yes Clinical Summary of Care: Electronic Signature(s) Signed: 03/01/2023 11:14:40 AM By: Midge Aver MSN RN CNS WTA Entered By: Midge Aver on 03/01/2023 11:14:40 -------------------------------------------------------------------------------- Lower Extremity Assessment Details Patient Name: Date of Service: [name], [name] 03/01/2023 10:00 A M Medical Record Number: [id number] Patient Account Number: [id number] Date of Birth/Sex: Treating RN: 1941-01-17 (82 y.o. [name] Primary Care Aubery Douthat: Dorothey Baseman Other Clinician: Referring Arien Benincasa: Treating [name]/Extender: [name], Megan Pina ([id number]) [id number] Page 4 of 8 Weeks in Treatment: 16 Electronic Signature(s) Signed: 03/04/2023 4:39:16 PM By: Midge Aver MSN RN CNS WTA Entered By: Midge Aver on 03/01/2023 10:33:32 -------------------------------------------------------------------------------- Multi Wound Chart Details Patient Name: Date of Service: [name] ES, [name] 03/01/2023 10:00 A M Medical Record Number: [id number] Patient Account Number: [id number] Date of Birth/Sex: Treating RN: [birth year] (82 y.o. [name] Primary Care Callen Zuba: Dorothey Baseman Other Clinician: Referring Carry Ortez: Treating Milus Fritze/Extender: Jannifer Rodney in Treatment: 16 Vital Signs Height(in): 68 Pulse(bpm): 67 Weight(lbs): 104 Blood Pressure(mmHg): 153/69 Body Mass Index(BMI): 15.8 Temperature(F): 97.8 Respiratory Rate(breaths/min): 18 [1:Photos:] [N/A:N/A] Right Gluteus N/A N/A Wound Location: Pressure Injury N/A N/A Wounding Event: Trauma, Other N/A N/A Primary Etiology: Anemia, History of pressure wounds, N/A N/A Comorbid History: Rheumatoid Arthritis, Osteoarthritis 09/04/2022 N/A  N/A Date Acquired: 16 N/A N/A Weeks of Treatment: Open N/A N/A Wound Status: No N/A N/A Wound Recurrence: 0.2x0.2x2 N/A N/A Measurements [name] x W x D (cm) 0.031 N/A N/A A (cm) : rea 0.063 N/A N/A Volume (cm) : 99.40% N/A N/A % Reduction in Area: 96.20% N/A N/A % Reduction in Volume: Full Thickness Without Exposed N/A N/A Classification: Support Structures Large N/A N/A Exudate A mount: Serosanguineous N/A N/A Exudate Type: red, brown N/A N/A Exudate Color: None Present (  0%) N/A N/A Granulation A mount: None Present (0%) N/A N/A Necrotic A mount: Fat Layer (Subcutaneous Tissue): No N/A N/A Exposed Structures: Small (1-33%) N/A N/A Epithelialization: Treatment Notes Electronic Signature(s) Signed: 03/04/2023 4:39:16 PM By: Midge Aver MSN RN CNS [name], Signed: 03/04/2023 4:39:16 PM By: Midge Aver MSN RN CNS [name] ([id number]) [id number] Page 5 of 8 Entered By: Midge Aver on 03/01/2023 10:44:26 -------------------------------------------------------------------------------- Multi-Disciplinary Care Plan Details Patient Name: Date of Service: [name] ES, [place] [name] 03/01/2023 10:00 A M Medical Record Number: [id number] Patient Account Number: [id number] Date of Birth/Sex: Treating RN: 05/11/1941 (82 y.o. [name] Primary Care Sharrie Self: Dorothey Baseman Other Clinician: Referring Tova Vater: Treating Autie Vasudevan/Extender: Jannifer Rodney in Treatment: 16 Active Inactive Wound/Skin Impairment Nursing Diagnoses: Impaired tissue integrity Knowledge deficit related to smoking impact on wound healing Knowledge deficit related to ulceration/compromised skin integrity Goals: Patient/caregiver will verbalize understanding of skin care regimen Date Initiated: 11/13/2022 Date Inactivated: 11/30/2022 Target Resolution Date: 11/08/2022 Goal Status: Met Ulcer/skin breakdown will have a volume reduction of 30% by week  4 Date Initiated: 11/13/2022 Date Inactivated: 12/21/2022 Target Resolution Date: 12/06/2022 Goal Status: Unmet Unmet Reason: comorbidities Ulcer/skin breakdown will have a volume reduction of 50% by week 8 Date Initiated: 11/13/2022 Date Inactivated: 01/04/2023 Target Resolution Date: 01/03/2023 Goal Status: Unmet Unmet Reason: comorbidities Ulcer/skin breakdown will have a volume reduction of 80% by week 12 Date Initiated: 11/13/2022 Date Inactivated: 01/29/2023 Target Resolution Date: 01/31/2023 Goal Status: Met Ulcer/skin breakdown will heal within 14 weeks Date Initiated: 11/13/2022 Target Resolution Date: 03/20/2023 Goal Status: Active Interventions: Assess patient/caregiver ability to obtain necessary supplies Assess ulceration(s) every visit Provide education on ulcer and skin care Treatment Activities: Skin care regimen initiated : 11/08/2022 Notes: Electronic Signature(s) Signed: 03/04/2023 4:39:16 PM By: Midge Aver MSN RN CNS WTA Entered By: Midge Aver on 03/01/2023 10:49:02 [name] ([id number]) [id number] Page 6 of 8 -------------------------------------------------------------------------------- Pain Assessment Details Patient Name: Date of Service: [name] ES, [place] [name] 03/01/2023 10:00 A M Medical Record Number: [id number] Patient Account Number: [id number] Date of Birth/Sex: Treating RN: 1941/02/12 (82 y.o. [name] Primary Care Cinda Hara: Dorothey Baseman Other Clinician: Referring Chi Garlow: Treating Jebadiah Imperato/Extender: Jannifer Rodney in Treatment: 16 Active Problems Location of Pain Severity and Description of Pain Patient Has Paino No Site Locations Pain Management and Medication Current Pain Management: Electronic Signature(s) Signed: 03/04/2023 4:39:16 PM By: Midge Aver MSN RN CNS WTA Entered By: Midge Aver on 03/01/2023  10:26:59 -------------------------------------------------------------------------------- Patient/Caregiver Education Details Patient Name: Date of Service: [name], [name] 7/12/2024andnbsp10:00 A M Medical Record Number: [id number] Patient Account Number: [id number] Date of Birth/Gender: Treating RN: 11/15/1940 (82 y.o. [name] Primary Care Physician: Dorothey Baseman Other Clinician: Referring Physician: Treating Physician/Extender: Jannifer Rodney in Treatment: [address] [name] ([id number]) [id number] Page 7 of 8 Education Assessment Education Provided To: Patient Education Topics Provided Wound/Skin Impairment: Handouts: Caring for Your Ulcer Methods: Explain/Verbal Responses: State content correctly Electronic Signature(s) Signed: 03/04/2023 4:39:16 PM By: Midge Aver MSN RN CNS WTA Entered By: Midge Aver on 03/01/2023 10:49:15 -------------------------------------------------------------------------------- Wound Assessment Details Patient Name: Date of Service: [name] ES, [name] 03/01/2023 10:00 A M Medical Record Number: [id number] Patient Account Number: [id number] Date of Birth/Sex: Treating RN: 1941/05/03 (82 y.o. [name] Primary Care Jalin Erpelding: Dorothey Baseman Other Clinician: Referring Liani Caris: Treating Jaidan Prevette/Extender: Jannifer Rodney in Treatment: 16 Wound Status Wound Number:  1 Primary Trauma, Other Etiology: Wound Location: Right Gluteus Wound Status: Open Wounding Event: Pressure Injury Comorbid Anemia, History of pressure wounds, Rheumatoid Arthritis, Date Acquired: 09/04/2022 History: Osteoarthritis Weeks Of Treatment: 16 Clustered Wound: No Photos Wound Measurements Length: (cm) 0.2 Width: (cm) 0.2 Depth: (cm) 2 Area: (cm) 0.031 Volume: (cm) 0.063 % Reduction in Area: 99.4% % Reduction in Volume: 96.2% Epithelialization: Small (1-33%) Wound  Description Classification: Full Thickness Without Exposed Support Exudate Amount: Large Exudate Type: Serosanguineous [name] ([id number]) Exudate Color: red, brown Structures Foul Odor After Cleansing: No Slough/Fibrino No [id number] Page 8 of 8 Wound Bed Granulation Amount: None Present (0%) Exposed Structure Necrotic Amount: None Present (0%) Fat Layer (Subcutaneous Tissue) Exposed: No Treatment Notes Wound #1 (Gluteus) Wound Laterality: Right Cleanser Wound Cleanser Discharge Instruction: Wash your hands with soap and water. Remove old dressing, discard into plastic bag and place into trash. Cleanse the wound with Wound Cleanser prior to applying a clean dressing using gauze sponges, not tissues or cotton balls. Do not scrub or use excessive force. Pat dry using gauze sponges, not tissue or cotton balls. Peri-Wound Care Topical Primary Dressing Endoform Natural, Non-fenestrated, 2x2 (in/in) Secondary Dressing (BORDER) Zetuvit Plus SILICONE BORDER Dressing 4x4 (in/in) Discharge Instruction: Please do not put silicone bordered dressings under wraps. Use non-bordered dressing only. Secured With Compression Wrap Compression Stockings Facilities manager) Signed: 03/04/2023 4:39:16 PM By: Midge Aver MSN RN CNS WTA Entered By: Midge Aver on 03/01/2023 10:33:22 -------------------------------------------------------------------------------- Vitals Details Patient Name: Date of Service: [name] ES, [name] 03/01/2023 10:00 A M Medical Record Number: [id number] Patient Account Number: [id number] Date of Birth/Sex: Treating RN: [birth year] (82 y.o. [name] Primary Care Marylyn Appenzeller: Dorothey Baseman Other Clinician: Referring Kalisa Girtman: Treating Kellianne Ek/Extender: Jannifer Rodney in Treatment: 16 Vital Signs Time Taken: 10:36 Temperature (F): 97.8 Height (in): 68 Pulse (bpm): 67 Weight (lbs):  104 Respiratory Rate (breaths/min): 18 Body Mass Index (BMI): 15.8 Blood Pressure (mmHg): 153/69 Reference Range: 80 - 120 mg / dl Electronic Signature(s) Signed: 03/04/2023 4:39:16 PM By: Midge Aver MSN RN CNS WTA Entered By: Midge Aver on 03/01/2023 10:26:53

## 2023-03-01 NOTE — Progress Notes (Addendum)
Megan, [name] ([id number]) [id number] Page 1 of 7 Visit Report for 03/01/2023 Chief Complaint Document Details Patient Name: Date of Service: [name] ES, [place] [name] 03/01/2023 10:00 A M Medical Record Number: [id number] Patient Account Number: [id number] Date of Birth/Sex: Treating RN: 04/28/1941 (82 y.o. [name] Primary Care Provider: Dorothey Baseman Other Clinician: Referring Provider: Treating Provider/Extender: Jannifer Rodney in Treatment: 16 Information Obtained from: Patient Chief Complaint Right gluteal contusion Electronic Signature(s) Signed: 03/01/2023 10:03:00 AM By: Allen Derry PA-C Entered By: Allen Derry on 03/01/2023 10:03:00 -------------------------------------------------------------------------------- HPI Details Patient Name: Date of Service: [name] ES, [name] 03/01/2023 10:00 A M Medical Record Number: [id number] Patient Account Number: [id number] Date of Birth/Sex: Treating RN: 1941-06-09 (82 y.o. [name] Primary Care Provider: Dorothey Baseman Other Clinician: Referring Provider: Treating Provider/Extender: Jannifer Rodney in Treatment: 16 History of Present Illness HPI Description: 11-08-2022 upon evaluation today patient presents for initial evaluation here in our clinic concerning an issue which occurred when she had a fall hitting her right gluteal region and this was actually on 09-04-2022. Since that time she has had an area of eschar as well as an area that was deeper that has been present she has had a hard time getting this to close. Subsequently I do believe this was likely a hematoma that occurred as result of contusion and that is why this is probably not healing as quickly as it needs to we also need to get the necrotic tissue off of this wound bed. Patient does not have any major medical problems. She actually is a very healthy 82 year old. 11-16-2022 upon evaluation  today patient appears to be doing better in regard to her wound although a lot of the necrotic tissue started to loosen up more we got have to definitely perform some debridement today. Fortunately I do not see any evidence of infection locally nor systemically which is great news. 11-23-2022 upon evaluation today patient appears to be doing poorly currently in regard to her wound which actually is a bit deeper. Nonetheless I think that this is something that is showing signs of the need for some sharp debridement she has an area of skin connecting where we cannot properly packed the wound we need to remove this and I discussed that with her today. I am going to need to numb her for this we will use lidocaine in order to do that so that make sure we do not cause any pain or problems. Fortunately I do not see any signs of active infection locally nor systemically at this time which is great news. 11-30-2022 upon evaluation today patient appears to be doing well currently in regard to her wound. She has been tolerating the dressing changes without complication. Fortunately there does not appear to be any signs of active infection locally nor systemically at this time. I think that the procedure last week [name] ([id number]) [id number] Page 2 of 7 clearing away the area of tissue between the 2 openings has made a dramatic benefit for her. 12-07-2022 upon evaluation today patient appears to be doing decently well in regard to her wounds. She has been tolerating the dressing changes without complication. Fortunately there does not appear to be any signs of active infection at this time which is great news. No fevers, chills, nausea, vomiting, or diarrhea. 12-14-2022 upon evaluation today patient appears to be doing well currently in regard to her wound. She has been tolerating the  dressing changes without complication. Fortunately there does not appear to be any signs of  active infection locally nor systemically which is great news. No fevers, chills, nausea, vomiting, or diarrhea. 12-21-2022 upon evaluation today patient appears to be doing well currently in regard to her wound. There is still is depth here but this seems to be a little bit less than last time this is good news. Fortunately I do not see any signs of active infection locally or systemically at this time which is excellent. 12-28-2022 upon evaluation today patient appears to be doing well currently in regard to her wound although the wound VAC did not have any foam whatsoever packed into the base of the wound. Fortunately there does not appear to be any signs of infection but again we have to have something to pack down into this area. At this point Hydrofera Blue rope is probably can be our best option. I discussed that with the patient today. With that being said working to send orders to have the [employer]	[place] rope in first and then the wound VAC applied over top of. Nonetheless why they were doing it where they would just put in a little bit [place] not even really doing what needs to be done from a bridge standpoint coupled with just a little time size area foam over the wound is really doing absolutely nothing for this patient. 01-04-2023 upon evaluation today patient appears to be doing well currently in regard to her wound. She has been tolerating the dressing changes without complication. We did switch after by phone call with the home health from the wound VAC due to the fact that she was not keeping this in place. She subsequently was pulling the wound VAC off for disconnecting it due to the fact that it was not comfortable for her and making a lot of noise. Nonetheless it was doing her noted. 01-18-2023 upon evaluation today patient's wound is actually showing signs of doing about the same at this point. Fortunately she does not appear to be showing any signs of active infection locally nor  systemically which is great news. With that being said the wound externally is try to close internally we still have a bit of space here. 01-29-2020 upon evaluation today patient appears to be doing well currently in regard to her wound which is actually showing signs of improvement. I am very pleased in that regard. I do not see any evidence of active infection locally or systemically which is great news. 02-08-2023 upon evaluation patient actually appears to be doing somewhat better in regard to the size of the wound though she still does have some depth. Will try to get this to feeling we are down to just a quarter size of the Hydrofera Blue rope which is actually pretty good and I am very pleased with where things stand today. I do think that she will likely need to continue with the Hydrofera Blue rope but nonetheless I think that this is moving in the right direction which is great news. 02-15-2023 upon evaluation today patient appears to be doing well currently with regard to her wound which is showing signs of no worsening overall. Also think that there is definitely improvement with regard to her pain and even the external size of the wound but internally this is still quite deep. I do believe that looking into Kerecis would be a possibility at this point. 03-01-2023 upon evaluation today patient appears to be doing well currently in regard  to her wound. She has been tolerating the dressing changes without complication. Fortunately there does not appear to be any signs of active infection locally nor systemically which is good news. With that being said unfortunately the wound is trying to send up a lot of ensure that the [facility] was truly packed into the area when she was last seen. Again this is so small that I cannot imagine that it actually stayed even if it was packed in. I think we need to switch to endoform and cut this so that it can be packed into more efficiently. Electronic  Signature(s) Signed: 03/01/2023 10:53:29 AM By: Allen Derry PA-C Entered By: Allen Derry on 03/01/2023 10:53:29 -------------------------------------------------------------------------------- Physical Exam Details Patient Name: Date of Service: [name], [name] 03/01/2023 10:00 A M Medical Record Number: [id number] Patient Account Number: [id number] Date of Birth/Sex: Treating RN: Apr 22, 1941 (82 y.o. [name] Primary Care Provider: Dorothey Baseman Other Clinician: Referring Provider: Treating Provider/Extender: Jannifer Rodney in Treatment: 16 Constitutional Well-nourished and well-hydrated in no acute distress. Respiratory normal breathing without difficulty. Psychiatric this patient is able to make decisions and demonstrates good insight into disease process. Alert and Oriented x 3. pleasant and cooperative. [name], [name] ([id number]) [id number] Page 3 of 7 Notes Upon inspection patient's wound bed actually showed signs of good granulation epithelization at this point. Fortunately I do not see any signs of worsening overall and I do believe that the patient is making good headway towards complete closure which is great news. Electronic Signature(s) Signed: 03/01/2023 10:53:45 AM By: Allen Derry PA-C Entered By: Allen Derry on 03/01/2023 10:53:45 -------------------------------------------------------------------------------- Physician Orders Details Patient Name: Date of Service: [name] ES, [name] 03/01/2023 10:00 A M Medical Record Number: [id number] Patient Account Number: [id number] Date of Birth/Sex: Treating RN: 1941/03/16 (82 y.o. 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 Home Health Home Health Company: - [facility] [phone] Fax, Rep Alfonzo Beers [phone], Nurse Haynes Kerns [phone] [facility] for wound care. May utilize formulary equivalent dressing for wound treatment orders unless otherwise specified. Home Health Nurse may visit PRN to address patients wound care needs. Scheduled days for dressing changes to be completed; exception, patient has scheduled wound care visit that day. **Please direct any NON-WOUND related issues/requests for orders to patient's Primary Care Physician. **If current dressing causes regression in wound condition, may D/C ordered dressing product/s and apply Normal Saline Moist Dressing daily until next Wound Healing Center or Other MD appointment. **Notify Wound Healing Center of regression in wound condition at [phone]. Bathing/ Shower/ Hygiene Clean wound with Normal Saline or wound cleanser. No tub bath. Off-Loading Turn and reposition every 2 hours Additional Orders / Instructions Follow Nutritious Diet and Increase Protein Intake Wound Treatment Wound #1 - Gluteus Wound Laterality: Right Cleanser: Wound Cleanser 3 x Per Week/30 Days Discharge Instructions: Wash your hands with soap and water. Remove old dressing, discard into plastic bag and place into trash. Cleanse the wound with Wound Cleanser prior to applying a clean dressing using gauze sponges, not tissues or cotton balls. Do not scrub or use excessive force. Pat dry using gauze sponges, not  tissue or cotton balls. Prim Dressing: Endoform Natural, Non-fenestrated, 2x2 (in/in) (Home Health) 3 x Per Week/30 Days ary Secondary Dressing: (BORDER) Zetuvit Plus SILICONE BORDER Dressing 4x4 (in/in) (Home Health) 3 x Per Week/30 Days Discharge Instructions: Please do not put silicone  bordered dressings under wraps. Use non-bordered dressing only. [name], [name] ([id number]) [id number] Page 4 of 7 Electronic Signature(s) Signed: 03/01/2023 2:27:58 PM By: Allen Derry PA-C Signed: 03/04/2023 4:39:16 PM By: Midge Aver MSN RN CNS WTA Entered By: Midge Aver on 03/01/2023 10:48:14 -------------------------------------------------------------------------------- Problem List Details Patient Name: Date of Service: [name] ES, [name] 03/01/2023 10:00 A M Medical Record Number: [id number] Patient Account Number: [id number] Date of Birth/Sex: Treating RN: [birth year] (82 y.o. 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 03/01/2023 10:00 A M Medical Record Number: [id number] Patient Account Number: [id number] Date of Birth/Sex: Treating RN: 20-Jun-1941 (82 y.o. [name] Primary Care Provider: Dorothey Baseman Other Clinician: Referring Provider: Treating Provider/Extender: Jannifer Rodney in Treatment: [address] Tonda [name] ([id number]) [id number] Page 5 of 7 Subjective Chief Complaint Information  obtained from Patient Right gluteal contusion History of Present Illness (HPI) 11-08-2022 upon evaluation today patient presents for initial evaluation here in our clinic concerning an issue which occurred when she had a fall hitting her right gluteal region and this was actually on 09-04-2022. Since that time she has had an area of eschar as well as an area that was deeper that has been present she has had a hard time getting this to close. Subsequently I do believe this was likely a hematoma that occurred as result of contusion and that is why this is probably not healing as quickly as it needs to we also need to get the necrotic tissue off of this wound bed. Patient does not have any major medical problems. She actually is a very healthy 82 year old. 11-16-2022 upon evaluation today patient appears to be doing better in regard to her wound although a lot of the necrotic tissue started to loosen up more we got have to definitely perform some debridement today. Fortunately I do not see any evidence of infection locally nor systemically which is great news. 11-23-2022 upon evaluation today patient appears to be doing poorly currently in regard to her wound which actually is a bit deeper. Nonetheless I think that this is something that is showing signs of the need for some sharp debridement she has an area of skin connecting where we cannot properly packed the wound we need to remove this and I discussed that with her today. I am going to need to numb her for this we will use lidocaine in order to do that so that make sure we do not cause any pain or problems. Fortunately I do not see any signs of active infection locally nor systemically at this time which is great news. 11-30-2022 upon evaluation  today patient appears to be doing well currently in regard to her wound. She has been tolerating the dressing changes without complication. Fortunately there does not appear to be any signs of active infection  locally nor systemically at this time. I think that the procedure last week clearing away the area of tissue between the 2 openings has made a dramatic benefit for her. 12-07-2022 upon evaluation today patient appears to be doing decently well in regard to her wounds. She has been tolerating the dressing changes without complication. Fortunately there does not appear to be any signs of active infection at this time which is great news. No fevers, chills, nausea, vomiting, or diarrhea. 12-14-2022 upon evaluation today patient appears to be doing well currently in regard to her wound. She has been tolerating the dressing changes without complication. Fortunately there does not appear to be any signs of active infection locally nor systemically which is great news. No fevers, chills, nausea, vomiting, or diarrhea. 12-21-2022 upon evaluation today patient appears to be doing well currently in regard to her wound. There is still is depth here but this seems to be a little bit less than last time this is good news. Fortunately I do not see any signs of active infection locally or systemically at this time which is excellent. 12-28-2022 upon evaluation today patient appears to be doing well currently in regard to her wound although the wound VAC did not have any foam whatsoever packed into the base of the wound. Fortunately there does not appear to be any signs of infection but again we have to have something to pack down into this area. At this point Hydrofera Blue rope is probably can be our best option. I discussed that with the patient today. With that being said working to send orders to have the [employer]	[place] rope in first and then the wound VAC applied over top of. Nonetheless why they were doing it where they would just put in a little bit [place] not even really doing what needs to be done from a bridge standpoint coupled with just a little time size area foam over the wound is really doing absolutely  nothing for this patient. 01-04-2023 upon evaluation today patient appears to be doing well currently in regard to her wound. She has been tolerating the dressing changes without complication. We did switch after by phone call with the home health from the wound VAC due to the fact that she was not keeping this in place. She subsequently was pulling the wound VAC off for disconnecting it due to the fact that it was not comfortable for her and making a lot of noise. Nonetheless it was doing her noted. 01-18-2023 upon evaluation today patient's wound is actually showing signs of doing about the same at this point. Fortunately she does not appear to be showing any signs of active infection locally nor systemically which is great news. With that being said the wound externally is try to close internally we still have a bit of space here. 01-29-2020 upon evaluation today patient appears to be doing well currently in regard to her wound which is actually showing signs of improvement. I am very pleased in that regard. I do not see any evidence of active infection locally or systemically which is great news. 02-08-2023 upon evaluation patient actually appears to be doing somewhat better in regard to the size of the wound though she still does have some depth. Will try to get this to feeling we  are down to just a quarter size of the Hydrofera Blue rope which is actually pretty good and I am very pleased with where things stand today. I do think that she will likely need to continue with the Hydrofera Blue rope but nonetheless I think that this is moving in the right direction which is great news. 02-15-2023 upon evaluation today patient appears to be doing well currently with regard to her wound which is showing signs of no worsening overall. Also think that there is definitely improvement with regard to her pain and even the external size of the wound but internally this is still quite deep. I do believe  that looking into Kerecis would be a possibility at this point. 03-01-2023 upon evaluation today patient appears to be doing well currently in regard to her wound. She has been tolerating the dressing changes without complication. Fortunately there does not appear to be any signs of active infection locally nor systemically which is good news. With that being said unfortunately the wound is trying to send up a lot of ensure that the [facility] was truly packed into the area when she was last seen. Again this is so small that I cannot imagine that it actually stayed even if it was packed in. I think we need to switch to endoform and cut this so that it can be packed into more efficiently. Objective Constitutional Well-nourished and well-hydrated in no acute distress. Vitals Time Taken: 10:36 AM, Height: 68 in, Weight: 104 lbs, BMI: 15.8, Temperature: 97.8 F, Pulse: 67 bpm, Respiratory Rate: 18 breaths/min, Blood Pressure: 153/69 mmHg. Respiratory [name], [name] ([id number]) [id number] Page 6 of 7 normal breathing without difficulty. Psychiatric this patient is able to make decisions and demonstrates good insight into disease process. Alert and Oriented x 3. pleasant and cooperative. General Notes: Upon inspection patient's wound bed actually showed signs of good granulation epithelization at this point. Fortunately I do not see any signs of worsening overall and I do believe that the patient is making good headway towards complete closure which is great news. Integumentary (Hair, Skin) Wound #1 status is Open. Original cause of wound was Pressure Injury. The date acquired was: 09/04/2022. The wound has been in treatment 16 weeks. The wound is located on the Right Gluteus. The wound measures 0.2cm length x 0.2cm width x 2cm depth; 0.031cm^2 area and 0.063cm^3 volume. There is a large amount of serosanguineous drainage noted. There is no granulation within the wound  bed. There is no necrotic tissue within the wound bed. 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 Home Health: Home Health Company: - [facility] [phone] Fax, Rep Alfonzo Beers [phone], Nurse Haynes Kerns [phone] [facility] for wound care. May utilize formulary equivalent dressing for wound treatment orders unless otherwise specified. Home Health Nurse may visit PRN to address patients wound care needs. Scheduled days for dressing changes to be completed; exception, patient has scheduled wound care visit that day. **Please direct any NON-WOUND related issues/requests for orders to patient's Primary Care Physician. **If current dressing causes regression in wound condition, may D/C ordered dressing product/s and apply Normal Saline Moist Dressing daily until next Wound Healing Center or Other MD appointment. **Notify Wound Healing Center of regression in wound condition at [phone]. Bathing/ Shower/ Hygiene: Clean wound with Normal Saline or wound cleanser. No tub bath. Off-Loading:  Turn and reposition every 2 hours Additional Orders / Instructions: Follow Nutritious Diet and Increase Protein Intake WOUND #1: - Gluteus Wound Laterality: Right Cleanser: Wound Cleanser 3 x Per Week/30 Days Discharge Instructions: Wash your hands with soap and water. Remove old dressing, discard into plastic bag and place into trash. Cleanse the wound with Wound Cleanser prior to applying a clean dressing using gauze sponges, not tissues or cotton balls. Do not scrub or use excessive force. Pat dry using gauze sponges, not tissue or cotton balls. Prim Dressing: Endoform Natural, Non-fenestrated, 2x2 (in/in) (Home Health) 3 x Per Week/30 Days ary Secondary Dressing: (BORDER) Zetuvit Plus SILICONE BORDER  Dressing 4x4 (in/in) (Home Health) 3 x Per Week/30 Days Discharge Instructions: Please do not put silicone bordered dressings under wraps. Use non-bordered dressing only. 1. I would recommend that we have the patient continue to monitor for any signs of infection or worsening. Based on what I am seeing I do think that we are making headway towards complete closure which is great news. 2. I am also can recommend that the patient should continue with the Zetuvit border foam dressing to cover. 3. I would also suggest that the patient should continue to use a packing type dressing but I do suggest that we do endoform as I think this is probably only thing that I can imagine we can fit into the area while still maintaining the integrity of the wound in general. I am afraid that if we use Hydrofera Blue cut small enough to fit into this that would be nice to be more likely to to break off and then she would have more of an issue. We will see patient back for reevaluation in 1 week here in the clinic. If anything worsens or changes patient will contact our office for additional recommendations. Electronic Signature(s) Signed: 03/01/2023 10:55:17 AM By: Allen Derry PA-C Entered By: Allen Derry on 03/01/2023 10:55:16 [name], [name] ([id number]) [id number] Page 7 of 7 -------------------------------------------------------------------------------- SuperBill Details Patient Name: Date of Service: [name] ES, [place] [name] 03/01/2023 Medical Record Number: [id number] Patient Account Number: [id number] Date of Birth/Sex: Treating RN: 1941/02/02 (82 y.o. 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

## 2023-03-05 DIAGNOSIS — L89313 Pressure ulcer of right buttock, stage 3: Secondary | ICD-10-CM | POA: Diagnosis not present

## 2023-03-06 DIAGNOSIS — L89313 Pressure ulcer of right buttock, stage 3: Secondary | ICD-10-CM | POA: Diagnosis not present

## 2023-03-08 ENCOUNTER — Ambulatory Visit: Payer: PPO | Admitting: Physician Assistant

## 2023-03-15 ENCOUNTER — Encounter: Payer: PPO | Admitting: Internal Medicine

## 2023-03-15 DIAGNOSIS — L89310 Pressure ulcer of right buttock, unstageable: Secondary | ICD-10-CM | POA: Diagnosis not present

## 2023-03-15 DIAGNOSIS — L98413 Non-pressure chronic ulcer of buttock with necrosis of muscle: Secondary | ICD-10-CM | POA: Diagnosis not present

## 2023-03-15 NOTE — Progress Notes (Signed)
TIOMBE, KOSMAN ([id number]) [id number] Page 1 of 6 Visit Report for 03/15/2023 HPI Details Patient Name: Date of Service: Megan Rusk ES, [name] 03/15/2023 10:30 A M Medical Record Number: [id number] Patient Account Number: [id number] Date of Birth/Sex: Treating RN: 09/25/1940 (82 y.o. Skip Mayer Primary Care Provider: Dorothey Baseman Other Clinician: Referring Provider: Treating Provider/Extender: Chauncey Mann, MICHA EL Romeo Rabon in Treatment: 18 History of Present Illness HPI Description: 11-08-2022 upon evaluation today patient presents for initial evaluation here in our clinic concerning an issue which occurred when she had a fall hitting her right gluteal region and this was actually on 09-04-2022. Since that time she has had an area of eschar as well as an area that was deeper that has been present she has had a hard time getting this to close. Subsequently I do believe this was likely a hematoma that occurred as result of contusion and that is why this is probably not healing as quickly as it needs to we also need to get the necrotic tissue off of this wound bed. Patient does not have any major medical problems. She actually is a very healthy 82 year old. 11-16-2022 upon evaluation today patient appears to be doing better in regard to her wound although a lot of the necrotic tissue started to loosen up more we got have to definitely perform some debridement today. Fortunately I do not see any evidence of infection locally nor systemically which is great news. 11-23-2022 upon evaluation today patient appears to be doing poorly currently in regard to her wound which actually is a bit deeper. Nonetheless I think that this is something that is showing signs of the need for some sharp debridement she has an area of skin connecting where we cannot properly packed the wound we need to remove this and I discussed that with her today. I am going to need to numb  her for this we will use lidocaine in order to do that so that make sure we do not cause any pain or problems. Fortunately I do not see any signs of active infection locally nor systemically at this time which is great news. 11-30-2022 upon evaluation today patient appears to be doing well currently in regard to her wound. She has been tolerating the dressing changes without complication. Fortunately there does not appear to be any signs of active infection locally nor systemically at this time. I think that the procedure last week clearing away the area of tissue between the 2 openings has made a dramatic benefit for her. 12-07-2022 upon evaluation today patient appears to be doing decently well in regard to her wounds. She has been tolerating the dressing changes without complication. Fortunately there does not appear to be any signs of active infection at this time which is great news. No fevers, chills, nausea, vomiting, or diarrhea. 12-14-2022 upon evaluation today patient appears to be doing well currently in regard to her wound. She has been tolerating the dressing changes without complication. Fortunately there does not appear to be any signs of active infection locally nor systemically which is great news. No fevers, chills, nausea, vomiting, or diarrhea. 12-21-2022 upon evaluation today patient appears to be doing well currently in regard to her wound. There is still is depth here but this seems to be a little bit less than last time this is good news. Fortunately I do not see any signs of active infection locally or systemically at this time which is excellent. 12-28-2022 upon  evaluation today patient appears to be doing well currently in regard to her wound although the wound VAC did not have any foam whatsoever packed into the base of the wound. Fortunately there does not appear to be any signs of infection but again we have to have something to pack down into this area. At this point Hydrofera  Blue rope is probably can be our best option. I discussed that with the patient today. With that being said working to send orders to have the [employer]	[place] rope in first and then the wound VAC applied over top of. Nonetheless why they were doing it where they would just put in a little bit [place] not even really doing what needs to be done from a bridge standpoint coupled with just a little time size area foam over the wound is really doing absolutely nothing for this patient. 01-04-2023 upon evaluation today patient appears to be doing well currently in regard to her wound. She has been tolerating the dressing changes without complication. We did switch after by phone call with the home health from the wound VAC due to the fact that she was not keeping this in place. She subsequently was pulling the wound VAC off for disconnecting it due to the fact that it was not comfortable for her and making a lot of noise. Nonetheless it was doing her noted. 01-18-2023 upon evaluation today patient's wound is actually showing signs of doing about the same at this point. Fortunately she does not appear to be showing any signs of active infection locally nor systemically which is great news. With that being said the wound externally is try to close internally we still have a bit of space here. 01-29-2020 upon evaluation today patient appears to be doing well currently in regard to her wound which is actually showing signs of improvement. I am very pleased in that regard. I do not see any evidence of active infection locally or systemically which is great news. 02-08-2023 upon evaluation patient actually appears to be doing somewhat better in regard to the size of the wound though she still does have some depth. Will try to get this to feeling we are down to just a quarter size of the Hydrofera Blue rope which is actually pretty good and I am very pleased with where things stand today. I do think that she will  likely need to continue with the Hydrofera Blue rope but nonetheless I think that this is moving in the right direction which is great news. 02-15-2023 upon evaluation today patient appears to be doing well currently with regard to her wound which is showing signs of no worsening overall. Also think that there is definitely improvement with regard to her pain and even the external size of the wound but internally this is still quite deep. I do believe that looking into Kerecis would be a possibility at this point. [name], [name] ([id number]) [id number] Page 2 of 6 03-01-2023 upon evaluation today patient appears to be doing well currently in regard to her wound. She has been tolerating the dressing changes without complication. Fortunately there does not appear to be any signs of active infection locally nor systemically which is good news. With that being said unfortunately the wound is trying to send up a lot of ensure that the [facility] was truly packed into the area when she was last seen. Again this is so small that I cannot imagine that it actually stayed even if it was packed  in. I think we need to switch to endoform and cut this so that it can be packed into more efficiently. 7/26; this is a patient with a wound on her lower sacrum. This was apparently a contusion from falling. This wound is gotten very small we have been using endoform with a border foam. The depth has improved in fact it is hard to determine whether there is a part of this that is not epithelialized Electronic Signature(s) Signed: 03/15/2023 2:26:28 PM By: Baltazar Najjar MD Entered By: Baltazar Najjar on 03/15/2023 11:31:34 -------------------------------------------------------------------------------- Physical Exam Details Patient Name: Date of Service: Megan [name], [name] 03/15/2023 10:30 A M Medical Record Number: [id number] Patient Account Number: [id number] Date of Birth/Sex:  Treating RN: 1941/07/12 (82 y.o. Skip Mayer Primary Care Provider: Dorothey Baseman Other Clinician: Referring Provider: Treating Provider/Extender: RO BSO N, MICHA EL Romeo Rabon in Treatment: 18 Constitutional Sitting or standing Blood Pressure is within target range for patient.. Pulse regular and within target range for patient.Marland Kitchen Respirations regular, non-labored and within target range.. Temperature is normal and within the target range for the patient.Marland Kitchen appears in no distress. Notes Wound exam; small wound in the sacral area. Under illumination it is even hard to determine whether there is any open tissue here. This looks like the type of wound that may epithelialize albeit within a small divot where the remanent of the wound is. There is no surrounding erythema no tenderness and no drainage Electronic Signature(s) Signed: 03/15/2023 2:26:28 PM By: Baltazar Najjar MD Entered By: Baltazar Najjar on 03/15/2023 11:32:44 -------------------------------------------------------------------------------- Physician Orders Details Patient Name: Date of Service: Megan [name], [name] 03/15/2023 10:30 A M Medical Record Number: [id number] Patient Account Number: [id number] Date of Birth/Sex: Treating RN: 1941-04-23 (82 y.o. Skip Mayer Primary Care Provider: Dorothey Baseman Other Clinician: Referring Provider: Treating Provider/Extender: Chauncey Mann, MICHA EL Romeo Rabon in Treatment: [phone] Verbal / Phone Orders: No Diagnosis Coding Follow-up Appointments REAGON, CARDINAS ([id number]) [id number] Page 3 of 6 Wound #1 Right Gluteus Return Appointment in 2 weeks. Home Health Home Health Company: - [facility] [phone] Fax, Rep Alfonzo Beers [phone], Nurse Haynes Kerns [phone] [facility] for wound care. May utilize formulary equivalent dressing for wound treatment orders unless otherwise specified. Home Health Nurse may  visit PRN to address patients wound care needs. Scheduled days for dressing changes to be completed; exception, patient has scheduled wound care visit that day. **Please direct any NON-WOUND related issues/requests for orders to patient's Primary Care Physician. **If current dressing causes regression in wound condition, may D/C ordered dressing product/s and apply Normal Saline Moist Dressing daily until next Wound Healing Center or Other MD appointment. **Notify Wound Healing Center of regression in wound condition at [phone]. Bathing/ Shower/ Hygiene Clean wound with Normal Saline or wound cleanser. No tub bath. Off-Loading Turn and reposition every 2 hours - Pick up cushion to sit on in car and chair at home. Additional Orders / Instructions Follow Nutritious Diet and Increase Protein Intake Wound Treatment Wound #1 - Gluteus Wound Laterality: Right Cleanser: Wound Cleanser 3 x Per Week/30 Days Discharge Instructions: Wash your hands with soap and water. Remove old dressing, discard into plastic bag and place into trash. Cleanse the wound with Wound Cleanser prior to applying a clean dressing using gauze sponges, not tissues or cotton balls. Do not scrub or use excessive force. Pat dry using gauze sponges, not tissue or cotton balls. Prim Dressing: Endoform  Natural, Non-fenestrated, 2x2 (in/in) (Home Health) 3 x Per Week/30 Days ary Secondary Dressing: (BORDER) Zetuvit Plus SILICONE BORDER Dressing 4x4 (in/in) (Home Health) 3 x Per Week/30 Days Discharge Instructions: Please do not put silicone bordered dressings under wraps. Use non-bordered dressing only. Electronic Signature(s) Signed: 03/15/2023 10:58:19 AM By: Elliot Gurney, BSN, RN, CWS, [name] RN, BSN Signed: 03/15/2023 2:26:28 PM By: Baltazar Najjar MD Entered By: Elliot Gurney, BSN, RN, CWS, [name] on 03/15/2023 10:58:19 -------------------------------------------------------------------------------- Problem List Details Patient Name: Date of  Service: Megan Rusk ES, [name] 03/15/2023 10:30 A M Medical Record Number: [id number] Patient Account Number: [id number] Date of Birth/Sex: Treating RN: 1941-01-12 (82 y.o. 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 03/15/2023 10:30 A M Medical Record Number: [id number] Patient Account Number: [id number] Date of Birth/Sex: Treating RN: 12/01/1940 (82 y.o. Skip Mayer Primary Care Provider: Dorothey Baseman Other Clinician: Referring Provider: Treating Provider/Extender: Chauncey Mann, MICHA EL Romeo Rabon in Treatment: 18 Subjective History of Present Illness (HPI) 11-08-2022 upon evaluation today patient presents for initial evaluation here in our clinic concerning an issue which occurred when she had a fall hitting her right gluteal region and this was actually on 09-04-2022. Since that time she has had an area of eschar as well as an area that was deeper that has been present she has had a hard time getting this to close. Subsequently I do believe this was likely a hematoma that occurred as result of contusion and that is why this is probably not healing as quickly as it needs to we also need to get the necrotic tissue  off of this wound bed. Patient does not have any major medical problems. She actually is a very healthy 82 year old. 11-16-2022 upon evaluation today patient appears to be doing better in regard to her wound although a lot of the necrotic tissue started to loosen up more we got have to definitely perform some debridement today. Fortunately I do not see any evidence of infection locally nor systemically which is great news. 11-23-2022 upon evaluation today patient appears to be doing poorly currently in regard to her wound which actually is a bit deeper. Nonetheless I think that this is something that is showing signs of the need for some sharp debridement she has an area of skin connecting where we cannot properly packed the wound we need to remove this and I discussed that with her today. I am going to need to numb her for this we will use lidocaine in order to do that so that make sure we do not cause any pain or problems. Fortunately I do not see any signs of active infection locally nor systemically at this time which is great news. 11-30-2022 upon evaluation today patient appears to be doing well currently in regard to her wound. She has been tolerating the dressing changes without complication. Fortunately there does not appear to be any signs of active infection  locally nor systemically at this time. I think that the procedure last week clearing away the area of tissue between the 2 openings has made a dramatic benefit for her. 12-07-2022 upon evaluation today patient appears to be doing decently well in regard to her wounds. She has been tolerating the dressing changes without complication. Fortunately there does not appear to be any signs of active infection at this time which is great news. No fevers, chills, nausea, vomiting, or diarrhea. 12-14-2022 upon evaluation today patient appears to be doing well currently in regard to her wound. She has been tolerating the dressing changes  without complication. Fortunately there does not appear to be any signs of active infection locally nor systemically which is great news. No fevers, chills, nausea, vomiting, or diarrhea. 12-21-2022 upon evaluation today patient appears to be doing well currently in regard to her wound. There is still is depth here but this seems to be a little bit less than last time this is good news. Fortunately I do not see any signs of active infection locally or systemically at this time which is excellent. 12-28-2022 upon evaluation today patient appears to be doing well currently in regard to her wound although the wound VAC did not have any foam whatsoever packed into the base of the wound. Fortunately there does not appear to be any signs of infection but again we have to have something to pack down into this area. At this point Hydrofera Blue rope is probably can be our best option. I discussed that with the patient today. With that being said working to send orders to have the [employer]	[place] rope in first and then the wound VAC applied over top of. Nonetheless why they were doing it where they would just put in a little bit [place] not even really doing what needs to be done from a bridge standpoint coupled with just a little time size area foam over the wound is really doing absolutely nothing for this patient. 01-04-2023 upon evaluation today patient appears to be doing well currently in regard to her wound. She has been tolerating the dressing changes without complication. We did switch after by phone call with the home health from the wound VAC due to the fact that she was not keeping this in place. She subsequently was pulling the wound VAC off for disconnecting it due to the fact that it was not comfortable for her and making a lot of noise. Nonetheless it was doing her noted. 01-18-2023 upon evaluation today patient's wound is actually showing signs of doing about the same at this point. Fortunately  she does not appear to be showing any signs of active infection locally nor systemically which is great news. With that being said the wound externally is try to close internally we still have a bit of space here. 01-29-2020 upon evaluation today patient appears to be doing well currently in regard to her wound which is actually showing signs of improvement. I am very pleased in that regard. I do not see any evidence of active infection locally or systemically which is great news. [name], [name] ([id number]) [id number] Page 5 of 6 02-08-2023 upon evaluation patient actually appears to be doing somewhat better in regard to the size of the wound though she still does have some depth. Will try to get this to feeling we are down to just a quarter size of the Hydrofera Blue rope which is actually pretty good and I am very pleased with where things  stand today. I do think that she will likely need to continue with the Hydrofera Blue rope but nonetheless I think that this is moving in the right direction which is great news. 02-15-2023 upon evaluation today patient appears to be doing well currently with regard to her wound which is showing signs of no worsening overall. Also think that there is definitely improvement with regard to her pain and even the external size of the wound but internally this is still quite deep. I do believe that looking into Kerecis would be a possibility at this point. 03-01-2023 upon evaluation today patient appears to be doing well currently in regard to her wound. She has been tolerating the dressing changes without complication. Fortunately there does not appear to be any signs of active infection locally nor systemically which is good news. With that being said unfortunately the wound is trying to send up a lot of ensure that the [facility] was truly packed into the area when she was last seen. Again this is so small that I cannot imagine that it  actually stayed even if it was packed in. I think we need to switch to endoform and cut this so that it can be packed into more efficiently. 7/26; this is a patient with a wound on her lower sacrum. This was apparently a contusion from falling. This wound is gotten very small we have been using endoform with a border foam. The depth has improved in fact it is hard to determine whether there is a part of this that is not epithelialized Objective Constitutional Sitting or standing Blood Pressure is within target range for patient.. Pulse regular and within target range for patient.Marland Kitchen Respirations regular, non-labored and within target range.. Temperature is normal and within the target range for the patient.Marland Kitchen appears in no distress. Vitals Time Taken: 10:25 AM, Height: 68 in, Weight: 104 lbs, BMI: 15.8, Temperature: 98 F, Pulse: 66 bpm, Respiratory Rate: 18 breaths/min, Blood Pressure: 141/66 mmHg. General Notes: Wound exam; small wound in the sacral area. Under illumination it is even hard to determine whether there is any open tissue here. This looks like the type of wound that may epithelialize albeit within a small divot where the remanent of the wound is. There is no surrounding erythema no tenderness and no drainage Integumentary (Hair, Skin) Wound #1 status is Open. Original cause of wound was Pressure Injury. The date acquired was: 09/04/2022. The wound has been in treatment 18 weeks. The wound is located on the Right Gluteus. The wound measures 0.1cm length x 0.1cm width x 0.8cm depth; 0.008cm^2 area and 0.006cm^3 volume. There is a large amount of serosanguineous drainage noted. 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 Home Health: Home Health Company: - [facility] [phone] Fax, Rep Alfonzo Beers  [phone], Nurse Haynes Kerns [phone] [facility] for wound care. May utilize formulary equivalent dressing for wound treatment orders unless otherwise specified. Home Health Nurse may visit PRN to address patients wound care needs. Scheduled days for dressing changes to be completed; exception, patient has scheduled wound care visit that day. **Please direct any NON-WOUND related issues/requests for orders to patient's Primary Care Physician. **If current dressing causes regression in wound condition, may D/C ordered dressing product/s and apply Normal Saline Moist Dressing daily until next Wound Healing Center or Other MD appointment. **Notify Wound Healing  Center of regression in wound condition at [phone]. Bathing/ Shower/ Hygiene: Clean wound with Normal Saline or wound cleanser. No tub bath. Off-Loading: Turn and reposition every 2 hours - Pick up cushion to sit on in car and chair at home. Additional Orders / Instructions: Follow Nutritious Diet and Increase Protein Intake WOUND #1: - Gluteus Wound Laterality: Right Cleanser: Wound Cleanser 3 x Per Week/30 Days Discharge Instructions: Wash your hands with soap and water. Remove old dressing, discard into plastic bag and place into trash. Cleanse the wound with Wound Cleanser prior to applying a clean dressing using gauze sponges, not tissues or cotton balls. Do not scrub or use excessive force. Pat dry using gauze sponges, not tissue or cotton balls. [name], [name] ([id number]) [id number] Page 6 of 6 Prim Dressing: Endoform Natural, Non-fenestrated, 2x2 (in/in) (Home Health) 3 x Per Week/30 Days ary Secondary Dressing: (BORDER) Zetuvit Plus SILICONE BORDER Dressing 4x4 (in/in) (Home Health) 3 x Per Week/30 Days Discharge Instructions: Please do not put silicone bordered dressings under wraps. Use non-bordered dressing only. 1. As described this is now a very tiny wound and with very  minimal depth. Within the folds of skin it is even hard to determine what if anything is open here. After some probing and spreading and aluminating I think there is still an open area that looks somewhat erythematous. I therefore elected to continue to dress this and cover this with border foam. I E continue the endoform for now 2. This may heal albeit within a small divot. She is likely going to need to continue to cover this area with foam indefinitely. Electronic Signature(s) Signed: 03/15/2023 2:26:28 PM By: Baltazar Najjar MD Entered By: Baltazar Najjar on 03/15/2023 11:34:34 -------------------------------------------------------------------------------- SuperBill Details Patient Name: Date of Service: Megan Rusk ES, [name] 03/15/2023 Medical Record Number: [id number] Patient Account Number: [id number] Date of Birth/Sex: Treating RN: 1941/01/11 (82 y.o. 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

## 2023-03-18 NOTE — Progress Notes (Signed)
[name], [name] ([id number]) [id number] Page 1 of 8 Visit Report for 03/15/2023 Arrival Information Details Patient Name: Date of Service: [name], [name]. 03/15/2023 10:30 A M Medical Record Number: [id number] Patient Account Number: [id number] Date of Birth/Sex: Treating RN: [birth year] (82 y.o. Skip Mayer Primary Care Jeanette Rauth: Dorothey Baseman Other Clinician: Referring Roston Grunewald: Treating Geena Weinhold/Extender: Chauncey Mann, MICHA EL Romeo Rabon in Treatment: 18 Visit Information History Since Last Visit Added or deleted any medications: No Patient Arrived: Ambulatory Has Dressing in Place as Prescribed: Yes Arrival Time: 10:16 Pain Present Now: No Accompanied By: self Transfer Assistance: None Patient Identification Verified: Yes Secondary Verification Process Completed: Yes Patient Requires Transmission-Based Precautions: No Patient Has Alerts: No Electronic Signature(s) Signed: 03/18/2023 3:52:01 PM By: Elliot Gurney, BSN, RN, CWS, [name] RN, BSN Entered By: Elliot Gurney, BSN, RN, CWS, [name] on 03/15/2023 10:25:07 -------------------------------------------------------------------------------- Clinic Level of Care Assessment Details Patient Name: Date of Service: [name], [name]. 03/15/2023 10:30 A M Medical Record Number: [id number] Patient Account Number: [id number] Date of Birth/Sex: Treating RN: [birth year] (82 y.o. Skip Mayer Primary Care Domanick Cuccia: Dorothey Baseman Other Clinician: Referring Alyha Marines: Treating Kazmir Oki/Extender: Chauncey Mann, MICHA EL Romeo Rabon in Treatment: 18 Clinic Level of Care Assessment Items TOOL 4 Quantity Score []  - 0 Use when only an EandM is performed on FOLLOW-UP visit ASSESSMENTS - Nursing Assessment / Reassessment X- 1 10 Reassessment of Co-morbidities (includes updates in patient status) X- 1 5 Reassessment of Adherence to Treatment Plan ASSESSMENTS - Wound and Skin A ssessment / Reassessment X -  Simple Wound Assessment / Reassessment - one wound 1 5 []  - 0 Complex Wound Assessment / Reassessment - multiple wounds [name] ([id number]) [id number] Page 2 of 8 []  - 0 Dermatologic / Skin Assessment (not related to wound area) ASSESSMENTS - Focused Assessment []  - 0 Circumferential Edema Measurements - multi extremities []  - 0 Nutritional Assessment / Counseling / Intervention []  - 0 Lower Extremity Assessment (monofilament, tuning fork, pulses) []  - 0 Peripheral Arterial Disease Assessment (using hand held doppler) ASSESSMENTS - Ostomy and/or Continence Assessment and Care []  - 0 Incontinence Assessment and Management []  - 0 Ostomy Care Assessment and Management (repouching, etc.) PROCESS - Coordination of Care X - Simple Patient / Family Education for ongoing care 1 15 []  - 0 Complex (extensive) Patient / Family Education for ongoing care []  - 0 Staff obtains Chiropractor, Records, T Results / Process Orders est []  - 0 Staff telephones HHA, Nursing Homes / Clarify orders / etc []  - 0 Routine Transfer to another Facility (non-emergent condition) []  - 0 Routine Hospital Admission (non-emergent condition) []  - 0 New Admissions / Manufacturing engineer / Ordering NPWT Apligraf, etc. , []  - 0 Emergency Hospital Admission (emergent condition) X- 1 10 Simple Discharge Coordination []  - 0 Complex (extensive) Discharge Coordination PROCESS - Special Needs []  - 0 Pediatric / Minor Patient Management []  - 0 Isolation Patient Management []  - 0 Hearing / Language / Visual special needs []  - 0 Assessment of Community assistance (transportation, D/C planning, etc.) []  - 0 Additional assistance / Altered mentation []  - 0 Support Surface(s) Assessment (bed, cushion, seat, etc.) INTERVENTIONS - Wound Cleansing / Measurement X - Simple Wound Cleansing - one wound 1 5 []  - 0 Complex Wound Cleansing - multiple wounds X- 1 5 Wound Imaging  (photographs - any number of wounds) []  - 0 Wound Tracing (instead of photographs) X- 1 5 Simple Wound  Measurement - one wound []  - 0 Complex Wound Measurement - multiple wounds INTERVENTIONS - Wound Dressings []  - 0 Small Wound Dressing one or multiple wounds X- 1 15 Medium Wound Dressing one or multiple wounds []  - 0 Large Wound Dressing one or multiple wounds []  - 0 Application of Medications - topical []  - 0 Application of Medications - injection INTERVENTIONS - Miscellaneous []  - 0 External ear exam []  - 0 Specimen Collection (cultures, biopsies, blood, body fluids, etc.) []  - 0 Specimen(s) / Culture(s) sent or taken to Lab for analysis [name], [name] ([id number]) [id number] Page 3 of 8 []  - 0 Patient Transfer (multiple staff / Nurse, adult / Similar devices) []  - 0 Simple Staple / Suture removal (25 or less) []  - 0 Complex Staple / Suture removal (26 or more) []  - 0 Hypo / Hyperglycemic Management (close monitor of Blood Glucose) []  - 0 Ankle / Brachial Index (ABI) - do not check if billed separately X- 1 5 Vital Signs Has the patient been seen at the hospital within the last three years: Yes Total Score: 80 Level Of Care: New/Established - Level 3 Electronic Signature(s) Signed: 03/18/2023 3:52:01 PM By: Elliot Gurney, BSN, RN, CWS, [name] RN, BSN Entered By: Elliot Gurney, BSN, RN, CWS, [name] on 03/15/2023 11:00:15 -------------------------------------------------------------------------------- Encounter Discharge Information Details Patient Name: Date of Service: [name], [name]. 03/15/2023 10:30 A M Medical Record Number: [id number] Patient Account Number: [id number] Date of Birth/Sex: Treating RN: [birth year] (82 y.o. Skip Mayer Primary Care Juanjose Mojica: Dorothey Baseman Other Clinician: Referring Acxel Dingee: Treating Jaretzy Lhommedieu/Extender: RO BSO Dorris Carnes, MICHA EL Romeo Rabon in Treatment: 18 Encounter Discharge Information Items Discharge  Condition: Stable Ambulatory Status: Ambulatory Discharge Destination: Home Transportation: Private Auto Schedule Follow-up Appointment: Yes Clinical Summary of Care: Electronic Signature(s) Signed: 03/15/2023 11:03:14 AM By: Elliot Gurney, BSN, RN, CWS, [name] RN, BSN Entered By: Elliot Gurney, BSN, RN, CWS, [name] on 03/15/2023 11:03:14 -------------------------------------------------------------------------------- Lower Extremity Assessment Details Patient Name: Date of Service: [name], [name]. 03/15/2023 10:30 A M Medical Record Number: [id number] Patient Account Number: [id number] Date of Birth/Sex: Treating RN: 1941-01-18 (82 y.o. Skip Mayer Primary Care Makaiya Geerdes: Dorothey Baseman Other Clinician: Referring Yuette Putnam: Treating Fredia Chittenden/Extender: Chauncey Mann, MICHA EL Romeo Rabon in Treatment: [address] [name] ([id number]) [id number] Page 4 of 8 Electronic Signature(s) Signed: 03/18/2023 3:52:01 PM By: Elliot Gurney, BSN, RN, CWS, [name] RN, BSN Entered By: Elliot Gurney, BSN, RN, CWS, [name] on 03/15/2023 10:30:14 -------------------------------------------------------------------------------- Multi Wound Chart Details Patient Name: Date of Service: [name], [name]. 03/15/2023 10:30 A M Medical Record Number: [id number] Patient Account Number: [id number] Date of Birth/Sex: Treating RN: November 09, 1940 (82 y.o. Cathlean Cower, [name] Primary Care Galan Ghee: Dorothey Baseman Other Clinician: Referring Amron Guerrette: Treating Tashiya Souders/Extender: RO BSO Dorris Carnes, MICHA EL Romeo Rabon in Treatment: 18 Vital Signs Height(in): 68 Pulse(bpm): 66 Weight(lbs): 104 Blood Pressure(mmHg): 141/66 Body Mass Index(BMI): 15.8 Temperature(F): 98 Respiratory Rate(breaths/min): 18 [1:Photos: No Photos Right Gluteus Wound Location: Pressure Injury Wounding Event: Trauma, Other Primary Etiology: 09/04/2022 Date Acquired: 18 Weeks of Treatment: Open Wound Status: No Wound Recurrence: 0.1x0.1x0.8  Measurements [name] x W x D (cm) 0.008 A (cm) : rea  0.006 Volume (cm) : 99.90% % Reduction in A rea: 99.60% % Reduction in Volume: Full Thickness Without Exposed Classification: Support Structures Large Exudate Amount: Serosanguineous Exudate Type: red, brown Exudate Color:] [N/A:N/A N/A N/A N/A N/A N/A  N/A N/A N/A N/A N/A N/A N/A N/A N/A N/A  N/A] Treatment Notes Electronic Signature(s) Signed: 03/15/2023 10:53:33 AM By: Elliot Gurney, BSN, RN, CWS, [name] RN, BSN Entered By: Elliot Gurney, BSN, RN, CWS, [name] on 03/15/2023 10:53:32 [name], [name] ([id number]) [id number] Page 5 of 8 -------------------------------------------------------------------------------- Multi-Disciplinary Care Plan Details Patient Name: Date of Service: [name], [name]. 03/15/2023 10:30 A M Medical Record Number: [id number] Patient Account Number: [id number] Date of Birth/Sex: Treating RN: 15-Dec-1940 (82 y.o. Skip Mayer Primary Care Bryanna Yim: Dorothey Baseman Other Clinician: Referring Arminta Gamm: Treating Shai Rasmussen/Extender: Chauncey Mann, MICHA EL Romeo Rabon in Treatment: 18 Active Inactive Wound/Skin Impairment Nursing Diagnoses: Impaired tissue integrity Knowledge deficit related to smoking impact on wound healing Knowledge deficit related to ulceration/compromised skin integrity Goals: Patient/caregiver will verbalize understanding of skin care regimen Date Initiated: 11/13/2022 Date Inactivated: 11/30/2022 Target Resolution Date: 11/08/2022 Goal Status: Met Ulcer/skin breakdown will have a volume reduction of 30% by week 4 Date Initiated: 11/13/2022 Date Inactivated: 12/21/2022 Target Resolution Date: 12/06/2022 Goal Status: Unmet Unmet Reason: comorbidities Ulcer/skin breakdown will have a volume reduction of 50% by week 8 Date Initiated: 11/13/2022 Date Inactivated: 01/04/2023 Target Resolution Date: 01/03/2023 Goal Status: Unmet Unmet Reason: comorbidities Ulcer/skin breakdown will have  a volume reduction of 80% by week 12 Date Initiated: 11/13/2022 Date Inactivated: 01/29/2023 Target Resolution Date: 01/31/2023 Goal Status: Met Ulcer/skin breakdown will heal within 14 weeks Date Initiated: 11/13/2022 Target Resolution Date: 03/20/2023 Goal Status: Active Interventions: Assess patient/caregiver ability to obtain necessary supplies Assess ulceration(s) every visit Provide education on ulcer and skin care Treatment Activities: Skin care regimen initiated : 11/08/2022 Notes: Electronic Signature(s) Signed: 03/15/2023 11:00:38 AM By: Elliot Gurney, BSN, RN, CWS, [name] RN, BSN Entered By: Elliot Gurney, BSN, RN, CWS, [name] on 03/15/2023 11:00:38 [name], [name] ([id number]) [id number] Page 6 of 8 -------------------------------------------------------------------------------- Pain Assessment Details Patient Name: Date of Service: [name], [name]. 03/15/2023 10:30 A M Medical Record Number: [id number] Patient Account Number: [id number] Date of Birth/Sex: Treating RN: [birth year] (82 y.o. Skip Mayer Primary Care Devondre Guzzetta: Dorothey Baseman Other Clinician: Referring Homer Miller: Treating Francine Hannan/Extender: Chauncey Mann, MICHA EL Romeo Rabon in Treatment: 18 Active Problems Location of Pain Severity and Description of Pain Patient Has Paino No Site Locations Pain Management and Medication Current Pain Management: Electronic Signature(s) Signed: 03/18/2023 3:52:01 PM By: Elliot Gurney, BSN, RN, CWS, [name] RN, BSN Entered By: Elliot Gurney, BSN, RN, CWS, [name] on 03/15/2023 10:25:43 -------------------------------------------------------------------------------- Patient/Caregiver Education Details Patient Name: Date of Service: [name], [name]. 7/26/2024andnbsp10:30 A M Medical Record Number: [id number] Patient Account Number: [id number] Date of Birth/Gender: Treating RN: 1941-06-19 (82 y.o. Skip Mayer Primary Care Physician: Dorothey Baseman Other Clinician: Referring  Physician: Treating Physician/Extender: Chauncey Mann, MICHA EL Romeo Rabon in Treatment: [address] Judy [name] ([id number]) [id number] Page 7 of 8 Education Assessment Education Provided To: Patient Education Topics Provided Wound/Skin Impairment: Handouts: Caring for Your Ulcer, Other: continue wound care as prescribed Methods: Explain/Verbal Responses: State content correctly Electronic Signature(s) Signed: 03/18/2023 3:52:01 PM By: Elliot Gurney, BSN, RN, CWS, [name] RN, BSN Entered By: Elliot Gurney, BSN, RN, CWS, [name] on 03/15/2023 11:01:18 -------------------------------------------------------------------------------- Wound Assessment Details Patient Name: Date of Service: [name], [name]. 03/15/2023 10:30 A M Medical Record Number: [id number] Patient Account Number: [id number] Date of Birth/Sex: Treating RN: 1941/03/31 (82 y.o. Skip Mayer Primary Care Cieara Stierwalt: Dorothey Baseman Other Clinician: Referring Meria Crilly: Treating [name]/Extender: Chauncey Mann, MICHA EL Romeo Rabon in Treatment: 18 Wound Status Wound Number:  1 Primary Etiology: Trauma, Other Wound Location: Right Gluteus Wound Status: Open Wounding Event: Pressure Injury Date Acquired: 09/04/2022 Weeks Of Treatment: 18 Clustered Wound: No Wound Measurements Length: (cm) 0.1 Width: (cm) 0.1 Depth: (cm) 0.8 Area: (cm) 0.008 Volume: (cm) 0.006 % Reduction in Area: 99.9% % Reduction in Volume: 99.6% Wound Description Classification: Full Thickness Without Exposed Support Exudate Amount: Large Exudate Type: Serosanguineous Exudate Color: red, brown Structures Treatment Notes Wound #1 (Gluteus) Wound Laterality: Right Cleanser Wound Cleanser Discharge Instruction: Wash your hands with soap and water. Remove old dressing, discard into plastic bag and place into trash. Cleanse the wound with Wound Cleanser prior to applying a clean dressing using gauze sponges, not tissues or  cotton balls. Do not scrub or use excessive force. Pat dry using gauze sponges, not tissue or cotton balls. Peri-Wound Care Topical Jerrell, Jeilani [name] ([id number]) [id number] Page 8 of 8 Primary Dressing Endoform Natural, Non-fenestrated, 2x2 (in/in) Secondary Dressing (BORDER) Zetuvit Plus SILICONE BORDER Dressing 4x4 (in/in) Discharge Instruction: Please do not put silicone bordered dressings under wraps. Use non-bordered dressing only. Secured With Compression Wrap Compression Stockings Facilities manager) Signed: 03/18/2023 3:52:01 PM By: Elliot Gurney, BSN, RN, CWS, [name] RN, BSN Entered By: Elliot Gurney, BSN, RN, CWS, [name] on 03/15/2023 10:30:05 -------------------------------------------------------------------------------- Vitals Details Patient Name: Date of Service: [name], [name]. 03/15/2023 10:30 A M Medical Record Number: [id number] Patient Account Number: [id number] Date of Birth/Sex: Treating RN: 09/20/1940 (82 y.o. Skip Mayer Primary Care Maritsa Hunsucker: Dorothey Baseman Other Clinician: Referring Ekaterina Denise: Treating Magdalene Tardiff/Extender: Chauncey Mann, MICHA EL Romeo Rabon in Treatment: 18 Vital Signs Time Taken: 10:25 Temperature (F): 98 Height (in): 68 Pulse (bpm): 66 Weight (lbs): 104 Respiratory Rate (breaths/min): 18 Body Mass Index (BMI): 15.8 Blood Pressure (mmHg): 141/66 Reference Range: 80 - 120 mg / dl Electronic Signature(s) Signed: 03/18/2023 3:52:01 PM By: Elliot Gurney, BSN, RN, CWS, [name] RN, BSN Entered By: Elliot Gurney, BSN, RN, CWS, [name] on 03/15/2023 10:25:29

## 2023-03-20 DIAGNOSIS — L89313 Pressure ulcer of right buttock, stage 3: Secondary | ICD-10-CM | POA: Diagnosis not present

## 2023-03-29 ENCOUNTER — Ambulatory Visit: Payer: PPO | Admitting: Physician Assistant

## 2023-04-02 DIAGNOSIS — Z79899 Other long term (current) drug therapy: Secondary | ICD-10-CM | POA: Diagnosis not present

## 2023-04-02 DIAGNOSIS — M0579 Rheumatoid arthritis with rheumatoid factor of multiple sites without organ or systems involvement: Secondary | ICD-10-CM | POA: Diagnosis not present

## 2023-04-02 DIAGNOSIS — M159 Polyosteoarthritis, unspecified: Secondary | ICD-10-CM | POA: Diagnosis not present

## 2023-04-05 ENCOUNTER — Ambulatory Visit: Payer: PPO | Admitting: Physician Assistant

## 2023-04-05 DIAGNOSIS — E039 Hypothyroidism, unspecified: Secondary | ICD-10-CM | POA: Diagnosis not present

## 2023-04-19 ENCOUNTER — Encounter: Payer: PPO | Attending: Internal Medicine | Admitting: Internal Medicine

## 2023-04-19 DIAGNOSIS — M069 Rheumatoid arthritis, unspecified: Secondary | ICD-10-CM | POA: Diagnosis not present

## 2023-04-19 DIAGNOSIS — L98413 Non-pressure chronic ulcer of buttock with necrosis of muscle: Secondary | ICD-10-CM | POA: Insufficient documentation

## 2023-04-19 DIAGNOSIS — L89310 Pressure ulcer of right buttock, unstageable: Secondary | ICD-10-CM | POA: Diagnosis not present

## 2023-04-19 NOTE — Progress Notes (Signed)
ALEXANNE, ZOLL ([id number]) [id number] Page 1 of 8 Visit Report for 04/19/2023 Arrival Information Details Patient Name: Date of Service: [name], [name]. 04/19/2023 10:45 A M Medical Record Number: [id number] Patient Account Number: [id number] Date of Birth/Sex: Treating RN: 05/22/1941 (82 y.o. Starleen Arms, Leah Primary Care Wess Baney: Dorothey Baseman Other Clinician: Referring Chamika Cunanan: Treating Paris Hohn/Extender: Chauncey Mann, MICHA EL Romeo Rabon in Treatment: 23 Visit Information History Since Last Visit All ordered tests and consults were completed: No Patient Arrived: Ambulatory Added or deleted any medications: No Arrival Time: 10:26 Any new allergies or adverse reactions: No Accompanied By: self Had a fall or experienced change in No Transfer Assistance: None activities of daily living that may affect Patient Identification Verified: Yes risk of falls: Secondary Verification Process Completed: Yes Signs or symptoms of abuse/neglect since last visito No Patient Requires Transmission-Based Precautions: No Hospitalized since last visit: No Patient Has Alerts: No Implantable device outside of the clinic excluding No cellular tissue based products placed in the center since last visit: Has Dressing in Place as Prescribed: Yes Pain Present Now: No Electronic Signature(s) Signed: 04/19/2023 2:13:33 PM By: Bonnell Public Entered By: Bonnell Public on 04/19/2023 08:29:32 -------------------------------------------------------------------------------- Clinic Level of Care Assessment Details Patient Name: Date of Service: [name], [name]. 04/19/2023 10:45 A M Medical Record Number: [id number] Patient Account Number: [id number] Date of Birth/Sex: Treating RN: [birth year] (82 y.o. Starleen Arms, Leah Primary Care Tracina Beaumont: Dorothey Baseman Other Clinician: Referring Rashmi Tallent: Treating Eston Heslin/Extender: Chauncey Mann, MICHA EL Romeo Rabon in Treatment: 23 Clinic Level of Care Assessment Items TOOL 4 Quantity Score []  - 0 Use when only an EandM is performed on FOLLOW-UP visit ASSESSMENTS - Nursing Assessment / Reassessment X- 1 10 Reassessment of Co-morbidities (includes updates in patient status) X- 1 5 Reassessment of Adherence to Treatment Plan [name] ([id number]) [id number] Page 2 of 8 ASSESSMENTS - Wound and Skin A ssessment / Reassessment X - Simple Wound Assessment / Reassessment - one wound 1 5 []  - 0 Complex Wound Assessment / Reassessment - multiple wounds []  - 0 Dermatologic / Skin Assessment (not related to wound area) ASSESSMENTS - Focused Assessment []  - 0 Circumferential Edema Measurements - multi extremities []  - 0 Nutritional Assessment / Counseling / Intervention []  - 0 Lower Extremity Assessment (monofilament, tuning fork, pulses) []  - 0 Peripheral Arterial Disease Assessment (using hand held doppler) ASSESSMENTS - Ostomy and/or Continence Assessment and Care []  - 0 Incontinence Assessment and Management []  - 0 Ostomy Care Assessment and Management (repouching, etc.) PROCESS - Coordination of Care X - Simple Patient / Family Education for ongoing care 1 15 []  - 0 Complex (extensive) Patient / Family Education for ongoing care X- 1 10 Staff obtains Chiropractor, Records, T Results / Process Orders est []  - 0 Staff telephones HHA, Nursing Homes / Clarify orders / etc []  - 0 Routine Transfer to another Facility (non-emergent condition) []  - 0 Routine Hospital Admission (non-emergent condition) []  - 0 New Admissions / Manufacturing engineer / Ordering NPWT Apligraf, etc. , []  - 0 Emergency Hospital Admission (emergent condition) X- 1 10 Simple Discharge Coordination []  - 0 Complex (extensive) Discharge Coordination PROCESS - Special Needs []  - 0 Pediatric / Minor Patient Management []  - 0 Isolation Patient Management []  - 0 Hearing /  Language / Visual special needs []  - 0 Assessment of Community assistance (transportation, D/C planning, etc.) []  - 0 Additional assistance / Altered mentation []  -  0 Support Surface(s) Assessment (bed, cushion, seat, etc.) INTERVENTIONS - Wound Cleansing / Measurement []  - 0 Simple Wound Cleansing - one wound []  - 0 Complex Wound Cleansing - multiple wounds []  - 0 Wound Imaging (photographs - any number of wounds) []  - 0 Wound Tracing (instead of photographs) []  - 0 Simple Wound Measurement - one wound []  - 0 Complex Wound Measurement - multiple wounds INTERVENTIONS - Wound Dressings []  - 0 Small Wound Dressing one or multiple wounds []  - 0 Medium Wound Dressing one or multiple wounds []  - 0 Large Wound Dressing one or multiple wounds []  - 0 Application of Medications - topical []  - 0 Application of Medications - injection INTERVENTIONS - Miscellaneous []  - 0 External ear exam [name] ([id number]) [id number] Page 3 of 8 []  - 0 Specimen Collection (cultures, biopsies, blood, body fluids, etc.) []  - 0 Specimen(s) / Culture(s) sent or taken to Lab for analysis []  - 0 Patient Transfer (multiple staff / Michiel Sites Lift / Similar devices) []  - 0 Simple Staple / Suture removal (25 or less) []  - 0 Complex Staple / Suture removal (26 or more) []  - 0 Hypo / Hyperglycemic Management (close monitor of Blood Glucose) []  - 0 Ankle / Brachial Index (ABI) - do not check if billed separately X- 1 5 Vital Signs Has the patient been seen at the hospital within the last three years: Yes Total Score: 60 Level Of Care: New/Established - Level 2 Electronic Signature(s) Signed: 04/19/2023 2:13:33 PM By: Bonnell Public Entered By: Bonnell Public on 04/19/2023 08:30:57 -------------------------------------------------------------------------------- Encounter Discharge Information Details Patient Name: Date of Service: [name]. 04/19/2023 10:45 A  M Medical Record Number: [id number] Patient Account Number: [id number] Date of Birth/Sex: Treating RN: 30-Jan-1941 (82 y.o. Starleen Arms, Leah Primary Care Chord Takahashi: Dorothey Baseman Other Clinician: Referring Bettyjane Shenoy: Treating Kemya Shed/Extender: Chauncey Mann, MICHA EL Romeo Rabon in Treatment: 23 Encounter Discharge Information Items Discharge Condition: Stable Ambulatory Status: Ambulatory Discharge Destination: Home Transportation: Private Auto Accompanied By: self Schedule Follow-up Appointment: No Clinical Summary of Care: Electronic Signature(s) Signed: 04/19/2023 2:13:33 PM By: Bonnell Public Entered By: Bonnell Public on 04/19/2023 08:31:51 Lower Extremity Assessment Details -------------------------------------------------------------------------------- [name] ([id number]) [id number] Page 4 of 8 Patient Name: Date of Service: [name], [name] 04/19/2023 10:45 A M Medical Record Number: [id number] Patient Account Number: [id number] Date of Birth/Sex: Treating RN: February 07, 1941 (82 y.o. Starleen Arms, Leah Primary Care Eshaan Titzer: Dorothey Baseman Other Clinician: Referring Mylin Gignac: Treating Kniyah Khun/Extender: Chauncey Mann, MICHA EL Romeo Rabon in Treatment: 23 Electronic Signature(s) Signed: 04/19/2023 2:13:33 PM By: Bonnell Public Entered By: Bonnell Public on 04/19/2023 08:29:58 -------------------------------------------------------------------------------- Multi Wound Chart Details Patient Name: Date of Service: [name]. 04/19/2023 10:45 A M Medical Record Number: [id number] Patient Account Number: [id number] Date of Birth/Sex: Treating RN: 27-Sep-1940 (82 y.o. Starleen Arms, Leah Primary Care Chyane Greer: Dorothey Baseman Other Clinician: Referring Keegen Heffern: Treating Tylin Stradley/Extender: Chauncey Mann, MICHA EL Romeo Rabon in Treatment: 23 Vital Signs Height(in): 68 Pulse(bpm): 72 Weight(lbs): 104 Blood  Pressure(mmHg): 148/70 Body Mass Index(BMI): 15.8 Temperature(F): 98.2 Respiratory Rate(breaths/min): 18 [1:Photos:] [N/A:N/A] Right Gluteus N/A N/A Wound Location: Pressure Injury N/A N/A Wounding Event: Trauma, Other N/A N/A Primary Etiology: Anemia, History of pressure wounds, N/A N/A Comorbid History: Rheumatoid Arthritis, Osteoarthritis 09/04/2022 N/A N/A Date Acquired: 23 N/A N/A Weeks of Treatment: Healed - Epithelialized N/A N/A Wound Status: No N/A N/A Wound Recurrence: 0x0x0  N/A N/A Measurements [name] x W x D (cm) 0 N/A N/A A (cm) : rea 0 N/A N/A Volume (cm) : 100.00% N/A N/A % Reduction in Area: 100.00% N/A N/A % Reduction in Volume: Full Thickness Without Exposed N/A N/A Classification: Support Structures Large N/A N/A Exudate Amount: Serosanguineous N/A N/A Exudate Type: red, brown N/A N/A Exudate Color: Treatment Notes Wound #1 (Gluteus) Wound Laterality: Right Minney, [name] ([id number]) [id number] Page 5 of 8 Cleanser Peri-Wound Care Topical Primary Dressing Secondary Dressing Secured With Compression Wrap Compression Stockings Add-Ons Electronic Signature(s) Signed: 04/19/2023 2:13:33 PM By: Bonnell Public Entered By: Bonnell Public on 04/19/2023 08:30:05 -------------------------------------------------------------------------------- Multi-Disciplinary Care Plan Details Patient Name: Date of Service: [name]. 04/19/2023 10:45 A M Medical Record Number: [id number] Patient Account Number: [id number] Date of Birth/Sex: Treating RN: 22-Jun-1941 (82 y.o. Starleen Arms, Leah Primary Care Syerra Abdelrahman: Dorothey Baseman Other Clinician: Referring Ezio Wieck: Treating Zianna Dercole/Extender: Chauncey Mann, MICHA EL Romeo Rabon in Treatment: 23 Active Inactive Electronic Signature(s) Signed: 04/19/2023 2:13:33 PM By: Bonnell Public Entered By: Bonnell Public on 04/19/2023  08:31:17 -------------------------------------------------------------------------------- Pain Assessment Details Patient Name: Date of Service: Megan Wynell Balloon, [name]. 04/19/2023 10:45 A M Medical Record Number: [id number] Patient Account Number: [id number] Date of Birth/Sex: Treating RN: [birth year] (82 y.o. Starleen Arms, Leah Primary Care Rithika Seel: Dorothey Baseman Other Clinician: Referring Andrian Sabala: Treating Townsend Cudworth/Extender: Chauncey Mann, MICHA EL Romeo Rabon in Treatment: [address], [name] ([id number]) [id number] Page 6 of 8 Active Problems Location of Pain Severity and Description of Pain Patient Has Paino No Site Locations Pain Management and Medication Current Pain Management: Electronic Signature(s) Signed: 04/19/2023 2:13:33 PM By: Bonnell Public Entered By: Bonnell Public on 04/19/2023 08:29:47 -------------------------------------------------------------------------------- Patient/Caregiver Education Details Patient Name: Date of Service: [name]. 8/30/2024andnbsp10:45 A M Medical Record Number: [id number] Patient Account Number: [id number] Date of Birth/Gender: Treating RN: 07/17/1941 (82 y.o. Starleen Arms, Leah Primary Care Physician: Dorothey Baseman Other Clinician: Referring Physician: Treating Physician/Extender: Chauncey Mann, MICHA EL Romeo Rabon in Treatment: 23 Education Assessment Education Provided To: Patient Education Topics Provided Discharge Packet: Handouts: Other: continue offloading and skin protection Methods: Explain/Verbal Responses: State content correctly Electronic Signature(s) Signed: 04/19/2023 2:13:33 PM By: Bonnell Public Entered By: Bonnell Public on 04/19/2023 08:31:25 Otero, [name] ([id number]) [id number] Page 7 of 8 -------------------------------------------------------------------------------- Wound Assessment Details Patient Name: Date of Service: [name], [name]. 04/19/2023 10:45 A M Medical Record Number: [id number] Patient Account Number: [id number] Date of Birth/Sex: Treating RN: 09-26-1940 (82 y.o. [name] Primary Care Jedrek Dinovo: Dorothey Baseman Other Clinician: Referring Derrisha Foos: Treating Shawnice Tilmon/Extender: RO BSO Dorris Carnes, MICHA EL Romeo Rabon in Treatment: 23 Wound Status Wound Number: 1 Primary Trauma, Other Etiology: Wound Location: Right Gluteus Wound Status: Healed - Epithelialized Wounding Event: Pressure Injury Comorbid Anemia, History of pressure wounds, Rheumatoid Arthritis, Date Acquired: 09/04/2022 History: Osteoarthritis Weeks Of Treatment: 23 Clustered Wound: No Photos Wound Measurements Length: (cm) Width: (cm) Depth: (cm) Area: (cm) Volume: (cm) 0 % Reduction in Area: 100% 0 % Reduction in Volume: 100% 0 Epithelialization: Large (67-100%) 0 0 Wound Description Classification: Full Thickness Without Exposed Support Structures Exudate Amount: None Present Foul Odor After Cleansing: No Slough/Fibrino No Wound Bed Granulation Amount: None Present (0%) Exposed Structure Necrotic Amount: None Present (0%) Fascia Exposed: No Fat Layer (Subcutaneous Tissue) Exposed: No Tendon Exposed: No Muscle Exposed: No Joint Exposed: No Bone Exposed: No Treatment Notes  Wound #1 (Gluteus) Wound Laterality: Right Cleanser Peri-Wound Care Topical Primary Dressing Quintanilla, [name] ([id number]) [id number] Page 8 of 8 Secondary Dressing Secured With Compression Wrap Compression Stockings Add-Ons Electronic Signature(s) Signed: 04/19/2023 2:06:12 PM By: Angelina Pih Signed: 04/19/2023 2:13:33 PM By: Bonnell Public Entered By: Bonnell Public on 04/19/2023 08:30:34 -------------------------------------------------------------------------------- Vitals Details Patient Name: Date of Service: Megan Rusk ES, [name]. 04/19/2023 10:45 A M Medical Record Number:  [id number] Patient Account Number: [id number] Date of Birth/Sex: Treating RN: 1941-06-07 (82 y.o. Starleen Arms, Leah Primary Care Junaid Wurzer: Dorothey Baseman Other Clinician: Referring Aviyah Swetz: Treating Cesilia Shinn/Extender: Chauncey Mann, MICHA EL Romeo Rabon in Treatment: 23 Vital Signs Time Taken: 10:26 Temperature (F): 98.2 Height (in): 68 Pulse (bpm): 72 Weight (lbs): 104 Respiratory Rate (breaths/min): 18 Body Mass Index (BMI): 15.8 Blood Pressure (mmHg): 148/70 Reference Range: 80 - 120 mg / dl Electronic Signature(s) Signed: 04/19/2023 2:13:33 PM By: Bonnell Public Entered By: Bonnell Public on 04/19/2023 08:29:41

## 2023-04-19 NOTE — Progress Notes (Signed)
[name] ([id number]) [id number] Page 1 of 6 Visit Report for 04/19/2023 HPI Details Patient Name: Date of Service: [name], [name] 04/19/2023 10:45 A M Medical Record Number: [id number] Patient Account Number: [id number] Date of Birth/Sex: Treating RN: 01-29-1941 (82 y.o. [name], [name] Primary Care Provider: Dorothey [name] Other Clinician: Referring Provider: Treating Provider/Extender: [name], [name] in Treatment: 23 History of Present Illness HPI Description: 11-08-2022 upon evaluation today patient presents for initial evaluation here in our clinic concerning an issue which occurred when she had a fall hitting her right gluteal region and this was actually on 09-04-2022. Since that time she has had an area of eschar as well as an area that was deeper that has been present she has had a hard time getting this to close. Subsequently I do believe this was likely a hematoma that occurred as result of contusion and that is why this is probably not healing as quickly as it needs to we also need to get the necrotic tissue off of this wound bed. Patient does not have any major medical problems. She actually is a very healthy 82 year old. 11-16-2022 upon evaluation today patient appears to be doing better in regard to her wound although a lot of the necrotic tissue started to loosen up more we got have to definitely perform some debridement today. Fortunately I do not see any evidence of infection locally nor systemically which is great news. 11-23-2022 upon evaluation today patient appears to be doing poorly currently in regard to her wound which actually is a bit deeper. Nonetheless I think that this is something that is showing signs of the need for some sharp debridement she has an area of skin connecting where we cannot properly packed the wound we need to remove this and I discussed that with her today. I am going to need to numb  her for this we will use lidocaine in order to do that so that make sure we do not cause any pain or problems. Fortunately I do not see any signs of active infection locally nor systemically at this time which is great news. 11-30-2022 upon evaluation today patient appears to be doing well currently in regard to her wound. She has been tolerating the dressing changes without complication. Fortunately there does not appear to be any signs of active infection locally nor systemically at this time. I think that the procedure last week clearing away the area of tissue between the 2 openings has made a dramatic benefit for her. 12-07-2022 upon evaluation today patient appears to be doing decently well in regard to her wounds. She has been tolerating the dressing changes without complication. Fortunately there does not appear to be any signs of active infection at this time which is great news. No fevers, chills, nausea, vomiting, or diarrhea. 12-14-2022 upon evaluation today patient appears to be doing well currently in regard to her wound. She has been tolerating the dressing changes without complication. Fortunately there does not appear to be any signs of active infection locally nor systemically which is great news. No fevers, chills, nausea, vomiting, or diarrhea. 12-21-2022 upon evaluation today patient appears to be doing well currently in regard to her wound. There is still is depth here but this seems to be a little bit less than last time this is good news. Fortunately I do not see any signs of active infection locally or systemically at this time which is excellent. 12-28-2022 upon  evaluation today patient appears to be doing well currently in regard to her wound although the wound VAC did not have any foam whatsoever packed into the base of the wound. Fortunately there does not appear to be any signs of infection but again we have to have something to pack down into this area. At this point Hydrofera  Blue rope is probably can be our best option. I discussed that with the patient today. With that being said working to send orders to have the [employer]	[place] rope in first and then the wound VAC applied over top of. Nonetheless why they were doing it where they would just put in a little bit [place] not even really doing what needs to be done from a bridge standpoint coupled with just a little time size area foam over the wound is really doing absolutely nothing for this patient. 01-04-2023 upon evaluation today patient appears to be doing well currently in regard to her wound. She has been tolerating the dressing changes without complication. We did switch after by phone call with the home health from the wound VAC due to the fact that she was not keeping this in place. She subsequently was pulling the wound VAC off for disconnecting it due to the fact that it was not comfortable for her and making a lot of noise. Nonetheless it was doing her noted. 01-18-2023 upon evaluation today patient's wound is actually showing signs of doing about the same at this point. Fortunately she does not appear to be showing any signs of active infection locally nor systemically which is great news. With that being said the wound externally is try to close internally we still have a bit of space here. 01-29-2020 upon evaluation today patient appears to be doing well currently in regard to her wound which is actually showing signs of improvement. I am very pleased in that regard. I do not see any evidence of active infection locally or systemically which is great news. 02-08-2023 upon evaluation patient actually appears to be doing somewhat better in regard to the size of the wound though she still does have some depth. Will try to get this to feeling we are down to just a quarter size of the Hydrofera Blue rope which is actually pretty good and I am very pleased with where things stand today. I do think that she will  likely need to continue with the Hydrofera Blue rope but nonetheless I think that this is moving in the right direction which is great news. 02-15-2023 upon evaluation today patient appears to be doing well currently with regard to her wound which is showing signs of no worsening overall. Also think that there is definitely improvement with regard to her pain and even the external size of the wound but internally this is still quite deep. I do believe that looking into Kerecis would be a possibility at this point. HARLEI, BAAL ([id number]) [id number] Page 2 of 6 03-01-2023 upon evaluation today patient appears to be doing well currently in regard to her wound. She has been tolerating the dressing changes without complication. Fortunately there does not appear to be any signs of active infection locally nor systemically which is good news. With that being said unfortunately the wound is trying to send up a lot of ensure that the [facility] was truly packed into the area when she was last seen. Again this is so small that I cannot imagine that it actually stayed even if it was packed  in. I think we need to switch to endoform and cut this so that it can be packed into more efficiently. 7/26; this is a patient with a wound on her lower sacrum. This was apparently a contusion from falling. This wound is gotten very small we have been using endoform with a border foam. The depth has improved in fact it is hard to determine whether there is a part of this that is not epithelialized 8/30 this is an area on the lower sacrum. We saw this a month ago and it was just about closed. It is however close today. Electronic Signature(s) Signed: 04/19/2023 2:10:50 PM By: Baltazar Najjar MD Entered By: Baltazar Najjar on 04/19/2023 07:43:04 -------------------------------------------------------------------------------- Physical Exam Details Patient Name: Date of Service: Megan [name], PA  TSY L. 04/19/2023 10:45 A M Medical Record Number: [id number] Patient Account Number: [id number] Date of Birth/Sex: Treating RN: March 21, 1941 (82 y.o. [name], [name] Primary Care Provider: Dorothey [name] Other Clinician: Referring Provider: Treating Provider/Extender: RO BSO N, [name] in Treatment: 23 Constitutional Sitting or standing Blood Pressure is within target range for patient.. Pulse regular and within target range for patient.Marland Kitchen Respirations regular, non-labored and within target range.. Temperature is normal and within the target range for the patient.Marland Kitchen appears in no distress. Notes Wound exam; we carefully inspected the sacral area and coccyx area under illumination. There is no open wound. She does however have skin over bone in this area and it would be important to be vigilant going forward Electronic Signature(s) Signed: 04/19/2023 2:10:50 PM By: Baltazar Najjar MD Entered By: Baltazar Najjar on 04/19/2023 07:43:45 -------------------------------------------------------------------------------- Physician Orders Details Patient Name: Date of Service: Megan [name], [name] 04/19/2023 10:45 A M Medical Record Number: [id number] Patient Account Number: [id number] Date of Birth/Sex: Treating RN: 20-Oct-1940 (82 y.o. [name], [name] Primary Care Provider: Dorothey [name] Other Clinician: Referring Provider: Treating Provider/Extender: [name], [name] in Treatment: 58 Verbal / Phone Orders: No Diagnosis Coding Mutchler, Minsa L ([id number]) [id number] Page 3 of 6 Discharge From [facility] Services Discharge from Wound Care Center Treatment Complete Electronic Signature(s) Signed: 04/19/2023 2:10:50 PM By: Baltazar Najjar MD Signed: 04/19/2023 2:13:33 PM By: Bonnell Public Entered By: Bonnell Public on 04/19/2023  08:30:50 -------------------------------------------------------------------------------- Problem List Details Patient Name: Date of Service: Megan [name], [name] 04/19/2023 10:45 A M Medical Record Number: [id number] Patient Account Number: [id number] Date of Birth/Sex: Treating RN: 08/30/1940 (82 y.o. 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 04/19/2023 10:45 A M Medical Record Number: [id number] Patient Account Number: [id number] Date of Birth/Sex: Treating RN: 08-28-1940 (82 y.o. [name], [name] Primary Care Provider: Dorothey [name] Other Clinician: Referring Provider: Treating Provider/Extender: [address] Megan EL Dante Gang [place], [place] ([id number]) [phone].pdf Page 4 of 6 Weeks in Treatment: 23 Subjective History of Present Illness (HPI) 11-08-2022 upon evaluation today patient presents for initial evaluation here in our clinic concerning an  issue which occurred when she had a fall hitting her right gluteal region and this was actually on 09-04-2022. Since that time she has had an area of eschar as well as an area that was deeper that has been present she has had a hard time getting this to close. Subsequently I do believe this was likely a hematoma that  occurred as result of contusion and that is why this is probably not healing as quickly as it needs to we also need to get the necrotic tissue off of this wound bed. Patient does not have any major medical problems. She actually is a very healthy 82 year old. 11-16-2022 upon evaluation today patient appears to be doing better in regard to her wound although a lot of the necrotic tissue started to loosen up more we got have to definitely perform some debridement today. Fortunately I do not see any evidence of infection locally nor systemically which is great news. 11-23-2022 upon evaluation today patient appears to be doing poorly currently in regard to her wound which actually is a bit deeper. Nonetheless I think that this is something that is showing signs of the need for some sharp debridement she has an area of skin connecting where we cannot properly packed the wound we need to remove this and I discussed that with her today. I am going to need to numb her for this we will use lidocaine in order to do that so that make sure we do not cause any pain or problems. Fortunately I do not see any signs of active infection locally nor systemically at this time which is great news. 11-30-2022 upon evaluation today patient appears to be doing well currently in regard to her wound. She has been tolerating the dressing changes without complication. Fortunately there does not appear to be any signs of active infection locally nor systemically at this time. I think that the procedure last week clearing away the area of tissue between the 2 openings has made a dramatic benefit for her. 12-07-2022 upon evaluation today patient appears to be doing decently well in regard to her wounds. She has been tolerating the dressing changes without complication. Fortunately there does not appear to be any signs of active infection at this time which is great news. No fevers, chills, nausea, vomiting, or diarrhea. 12-14-2022 upon  evaluation today patient appears to be doing well currently in regard to her wound. She has been tolerating the dressing changes without complication. Fortunately there does not appear to be any signs of active infection locally nor systemically which is great news. No fevers, chills, nausea, vomiting, or diarrhea. 12-21-2022 upon evaluation today patient appears to be doing well currently in regard to her wound. There is still is depth here but this seems to be a little bit less than last time this is good news. Fortunately I do not see any signs of active infection locally or systemically at this time which is excellent. 12-28-2022 upon evaluation today patient appears to be doing well currently in regard to her wound although the wound VAC did not have any foam whatsoever packed into the base of the wound. Fortunately there does not appear to be any signs of infection but again we have to have something to pack down into this area. At this point Hydrofera Blue rope is probably can be our best option. I discussed that with the patient today. With that being said working to send orders to have the [employer]	[place] rope in first and  then the wound VAC applied over top of. Nonetheless why they were doing it where they would just put in a little bit [place] not even really doing what needs to be done from a bridge standpoint coupled with just a little time size area foam over the wound is really doing absolutely nothing for this patient. 01-04-2023 upon evaluation today patient appears to be doing well currently in regard to her wound. She has been tolerating the dressing changes without complication. We did switch after by phone call with the home health from the wound VAC due to the fact that she was not keeping this in place. She subsequently was pulling the wound VAC off for disconnecting it due to the fact that it was not comfortable for her and making a lot of noise. Nonetheless it was doing her  noted. 01-18-2023 upon evaluation today patient's wound is actually showing signs of doing about the same at this point. Fortunately she does not appear to be showing any signs of active infection locally nor systemically which is great news. With that being said the wound externally is try to close internally we still have a bit of space here. 01-29-2020 upon evaluation today patient appears to be doing well currently in regard to her wound which is actually showing signs of improvement. I am very pleased in that regard. I do not see any evidence of active infection locally or systemically which is great news. 02-08-2023 upon evaluation patient actually appears to be doing somewhat better in regard to the size of the wound though she still does have some depth. Will try to get this to feeling we are down to just a quarter size of the Hydrofera Blue rope which is actually pretty good and I am very pleased with where things stand today. I do think that she will likely need to continue with the Hydrofera Blue rope but nonetheless I think that this is moving in the right direction which is great news. 02-15-2023 upon evaluation today patient appears to be doing well currently with regard to her wound which is showing signs of no worsening overall. Also think that there is definitely improvement with regard to her pain and even the external size of the wound but internally this is still quite deep. I do believe that looking into Kerecis would be a possibility at this point. 03-01-2023 upon evaluation today patient appears to be doing well currently in regard to her wound. She has been tolerating the dressing changes without complication. Fortunately there does not appear to be any signs of active infection locally nor systemically which is good news. With that being said unfortunately the wound is trying to send up a lot of ensure that the [facility] was truly packed into the area when she was last seen.  Again this is so small that I cannot imagine that it actually stayed even if it was packed in. I think we need to switch to endoform and cut this so that it can be packed into more efficiently. 7/26; this is a patient with a wound on her lower sacrum. This was apparently a contusion from falling. This wound is gotten very small we have been using endoform with a border foam. The depth has improved in fact it is hard to determine whether there is a part of this that is not epithelialized 8/30 this is an area on the lower sacrum. We saw this a month ago and it was just about closed. It is however  close today. Objective Constitutional Sitting or standing Blood Pressure is within target range for patient.. Pulse regular and within target range for patient.Marland Kitchen Respirations regular, non-labored and within target range.. Temperature is normal and within the target range for the patient.Marland Kitchen appears in no distress. Vitals Time Taken: 10:26 AM, Height: 68 in, Weight: 104 lbs, BMI: 15.8, Temperature: 98.2 F, Pulse: 72 bpm, Respiratory Rate: 18 breaths/min, Blood Bouska, Fate L ([id number]) [id number] Page 5 of 6 Pressure: 148/70 mmHg. General Notes: Wound exam; we carefully inspected the sacral area and coccyx area under illumination. There is no open wound. She does however have skin over bone in this area and it would be important to be vigilant going forward Integumentary (Hair, Skin) Wound #1 status is Healed - Epithelialized. Original cause of wound was Pressure Injury. The date acquired was: 09/04/2022. The wound has been in treatment 23 weeks. The wound is located on the Right Gluteus. The wound measures 0cm length x 0cm width x 0cm depth; 0cm^2 area and 0cm^3 volume. There is a large amount of serosanguineous drainage noted. 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 The patient is healed 2. We talked a little bit about secondary prevention. As noted she has skin over bone in this area certainly would not take much in terms of a prolonged episode of immobility for this to reopen. I urged her to try to keep off this area both when she is sitting and when she is in bed. 3. Otherwise she can be discharged from the clinic Electronic Signature(s) Signed: 04/19/2023 2:10:50 PM By: Baltazar Najjar MD Entered By: Baltazar Najjar on 04/19/2023 07:44:31 -------------------------------------------------------------------------------- SuperBill Details Patient Name: Date of Service: Megan [name], [name] 04/19/2023 Medical Record Number: [id number] Patient Account Number: [id number] Date of Birth/Sex: Treating RN: 03/18/1941 (82 y.o. 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 Facility Procedures : MYRA, PRUNTY Code: [id number] ATSY L ([id number]) Description: [phone] - WOUND CARE VISIT-LEV 2 EST PT [phone] Modifier: [id number] Quantity: 1 Page 6 of 6 Physician Procedures : CPT4 Code Description Modifier [id number] [phone] - WC PHYS LEVEL 2 - EST PT ICD-10 Diagnosis Description 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

## 2023-05-23 ENCOUNTER — Ambulatory Visit: Payer: PPO | Admitting: Podiatry

## 2023-05-23 DIAGNOSIS — B351 Tinea unguium: Secondary | ICD-10-CM

## 2023-05-23 DIAGNOSIS — M79674 Pain in right toe(s): Secondary | ICD-10-CM

## 2023-05-23 DIAGNOSIS — M79675 Pain in left toe(s): Secondary | ICD-10-CM | POA: Diagnosis not present

## 2023-05-23 NOTE — Progress Notes (Signed)
  Subjective:  Patient ID: [name], female    DOB: 03-02-1941,  MRN: [id number]  Chief Complaint  Patient presents with   Nail Problem    Nail trim    82 y.o. female returns for the above complaint.  Patient presents with thickened elongated serving mycotic toenails x 10 mild pain on palpation worse with ambulation is with pressure she would like for me to debride down.  She denies any other acute complaints.  Objective:  There were no vitals filed for this visit. Podiatric Exam: Vascular: dorsalis pedis and posterior tibial pulses are palpable bilateral. Capillary return is immediate. Temperature gradient is WNL. Skin turgor WNL  Sensorium: Normal Semmes Weinstein monofilament test. Normal tactile sensation bilaterally. Nail Exam: Pt has thick disfigured discolored nails with subungual debris noted bilateral entire nail hallux through fifth toenails.  Pain on palpation to the nails. Ulcer Exam: There is no evidence of ulcer or pre-ulcerative changes or infection. Orthopedic Exam: Muscle tone and strength are WNL. No limitations in general ROM. No crepitus or effusions noted.  Skin: No Porokeratosis. No infection or ulcers    Assessment & Plan:   1. Pain due to onychomycosis of toenails of both feet     Patient was evaluated and treated and all questions answered.  Onychomycosis with pain  -Nails palliatively debrided as below. -Educated on self-care  Procedure: Nail Debridement Rationale: pain  Type of Debridement: manual, sharp debridement. Instrumentation: Nail nipper, rotary burr. Number of Nails: 10  Procedures and Treatment: Consent by patient was obtained for treatment procedures. The patient understood the discussion of treatment and procedures well. All questions were answered thoroughly reviewed. Debridement of mycotic and hypertrophic toenails, 1 through 5 bilateral and clearing of subungual debris. No ulceration, no infection noted.  Return Visit-Office  Procedure: Patient instructed to return to the office for a follow up visit 3 months for continued evaluation and treatment.  Nicholes Rough, DPM    No follow-ups on file.

## 2023-05-28 DIAGNOSIS — M0579 Rheumatoid arthritis with rheumatoid factor of multiple sites without organ or systems involvement: Secondary | ICD-10-CM | POA: Diagnosis not present

## 2023-07-23 DIAGNOSIS — M0579 Rheumatoid arthritis with rheumatoid factor of multiple sites without organ or systems involvement: Secondary | ICD-10-CM | POA: Diagnosis not present

## 2023-08-06 DIAGNOSIS — Z7960 Long term (current) use of unspecified immunomodulators and immunosuppressants: Secondary | ICD-10-CM | POA: Diagnosis not present

## 2023-08-06 DIAGNOSIS — M0579 Rheumatoid arthritis with rheumatoid factor of multiple sites without organ or systems involvement: Secondary | ICD-10-CM | POA: Diagnosis not present

## 2023-08-16 DIAGNOSIS — E039 Hypothyroidism, unspecified: Secondary | ICD-10-CM | POA: Diagnosis not present

## 2023-08-16 DIAGNOSIS — M0579 Rheumatoid arthritis with rheumatoid factor of multiple sites without organ or systems involvement: Secondary | ICD-10-CM | POA: Diagnosis not present

## 2023-08-16 DIAGNOSIS — D509 Iron deficiency anemia, unspecified: Secondary | ICD-10-CM | POA: Diagnosis not present

## 2023-08-16 DIAGNOSIS — M810 Age-related osteoporosis without current pathological fracture: Secondary | ICD-10-CM | POA: Diagnosis not present

## 2023-08-27 ENCOUNTER — Encounter: Payer: Self-pay | Admitting: Oncology

## 2023-09-03 ENCOUNTER — Ambulatory Visit (INDEPENDENT_AMBULATORY_CARE_PROVIDER_SITE_OTHER): Payer: Medicare HMO | Admitting: Podiatry

## 2023-09-03 ENCOUNTER — Encounter: Payer: Self-pay | Admitting: Podiatry

## 2023-09-03 VITALS — Ht 68.0 in | Wt 113.1 lb

## 2023-09-03 DIAGNOSIS — B351 Tinea unguium: Secondary | ICD-10-CM | POA: Diagnosis not present

## 2023-09-03 DIAGNOSIS — M79674 Pain in right toe(s): Secondary | ICD-10-CM

## 2023-09-03 DIAGNOSIS — M79675 Pain in left toe(s): Secondary | ICD-10-CM | POA: Diagnosis not present

## 2023-09-03 NOTE — Progress Notes (Signed)
  Subjective:  Patient ID: [name], female    DOB: [birth year],  MRN: [id number]  Chief Complaint  Patient presents with   Nail Problem    RFC   83 y.o. female returns for the above complaint.  Patient presents with thickened elongated serving mycotic toenails x 10 mild pain on palpation worse with ambulation is with pressure she would like for me to debride down.  She denies any other acute complaints.  Objective:  There were no vitals filed for this visit. Podiatric Exam: Vascular: dorsalis pedis and posterior tibial pulses are palpable bilateral. Capillary return is immediate. Temperature gradient is WNL. Skin turgor WNL  Sensorium: Normal Semmes Weinstein monofilament test. Normal tactile sensation bilaterally. Nail Exam: Pt has thick disfigured discolored nails with subungual debris noted bilateral entire nail hallux through fifth toenails.  Pain on palpation to the nails. Ulcer Exam: There is no evidence of ulcer or pre-ulcerative changes or infection. Orthopedic Exam: Muscle tone and strength are WNL. No limitations in general ROM. No crepitus or effusions noted.  Skin: No Porokeratosis. No infection or ulcers    Assessment & Plan:   1. Pain due to onychomycosis of toenails of both feet       Patient was evaluated and treated and all questions answered.  Onychomycosis with pain  -Nails palliatively debrided as below. -Educated on self-care  Procedure: Nail Debridement Rationale: pain  Type of Debridement: manual, sharp debridement. Instrumentation: Nail nipper, rotary burr. Number of Nails: 10  Procedures and Treatment: Consent by patient was obtained for treatment procedures. The patient understood the discussion of treatment and procedures well. All questions were answered thoroughly reviewed. Debridement of mycotic and hypertrophic toenails, 1 through 5 bilateral and clearing of subungual debris. No ulceration, no infection noted.  Return Visit-Office  Procedure: Patient instructed to return to the office for a follow up visit 3 months for continued evaluation and treatment.  Franky Blanch, DPM    No follow-ups on file.

## 2023-09-04 ENCOUNTER — Encounter: Payer: Self-pay | Admitting: Oncology

## 2023-12-03 ENCOUNTER — Ambulatory Visit (INDEPENDENT_AMBULATORY_CARE_PROVIDER_SITE_OTHER): Payer: PPO | Admitting: Podiatry

## 2023-12-03 DIAGNOSIS — M79674 Pain in right toe(s): Secondary | ICD-10-CM

## 2023-12-03 DIAGNOSIS — M79675 Pain in left toe(s): Secondary | ICD-10-CM

## 2023-12-03 DIAGNOSIS — B351 Tinea unguium: Secondary | ICD-10-CM

## 2023-12-03 NOTE — Progress Notes (Signed)
  Subjective:  Patient ID: [name], female    DOB: 08/19/1941,  MRN: [id number]  Chief Complaint  Patient presents with   Nail Problem    Nail trim    83 y.o. female returns for the above complaint.  Patient presents with thickened elongated serving mycotic toenails x 10 mild pain on palpation worse with ambulation is with pressure she would like for me to debride down.  She denies any other acute complaints.  Objective:  There were no vitals filed for this visit. Podiatric Exam: Vascular: dorsalis pedis and posterior tibial pulses are palpable bilateral. Capillary return is immediate. Temperature gradient is WNL. Skin turgor WNL  Sensorium: Normal Semmes Weinstein monofilament test. Normal tactile sensation bilaterally. Nail Exam: Pt has thick disfigured discolored nails with subungual debris noted bilateral entire nail hallux through fifth toenails.  Pain on palpation to the nails. Ulcer Exam: There is no evidence of ulcer or pre-ulcerative changes or infection. Orthopedic Exam: Muscle tone and strength are WNL. No limitations in general ROM. No crepitus or effusions noted.  Skin: No Porokeratosis. No infection or ulcers    Assessment & Plan:   No diagnosis found.     Patient was evaluated and treated and all questions answered.  Onychomycosis with pain  -Nails palliatively debrided as below. -Educated on self-care  Procedure: Nail Debridement Rationale: pain  Type of Debridement: manual, sharp debridement. Instrumentation: Nail nipper, rotary burr. Number of Nails: 10  Procedures and Treatment: Consent by patient was obtained for treatment procedures. The patient understood the discussion of treatment and procedures well. All questions were answered thoroughly reviewed. Debridement of mycotic and hypertrophic toenails, 1 through 5 bilateral and clearing of subungual debris. No ulceration, no infection noted.  Return Visit-Office Procedure: Patient instructed to  return to the office for a follow up visit 3 months for continued evaluation and treatment.  Tinnie Forehand, DPM    No follow-ups on file.

## 2023-12-10 ENCOUNTER — Encounter: Payer: Self-pay | Admitting: Emergency Medicine

## 2023-12-10 ENCOUNTER — Encounter: Payer: Self-pay | Admitting: Oncology

## 2023-12-10 ENCOUNTER — Emergency Department

## 2023-12-10 ENCOUNTER — Emergency Department
Admission: EM | Admit: 2023-12-10 | Discharge: 2023-12-10 | Disposition: A | Discharge status: Home / Self Care | Attending: Emergency Medicine | Admitting: Emergency Medicine

## 2023-12-10 ENCOUNTER — Other Ambulatory Visit: Payer: Self-pay

## 2023-12-10 DIAGNOSIS — M25551 Pain in right hip: Secondary | ICD-10-CM | POA: Insufficient documentation

## 2023-12-10 DIAGNOSIS — N390 Urinary tract infection, site not specified: Secondary | ICD-10-CM | POA: Diagnosis not present

## 2023-12-10 DIAGNOSIS — I10 Essential (primary) hypertension: Secondary | ICD-10-CM | POA: Diagnosis not present

## 2023-12-10 DIAGNOSIS — G8911 Acute pain due to trauma: Secondary | ICD-10-CM | POA: Diagnosis not present

## 2023-12-10 DIAGNOSIS — W1839XA Other fall on same level, initial encounter: Secondary | ICD-10-CM | POA: Diagnosis not present

## 2023-12-10 DIAGNOSIS — Z96643 Presence of artificial hip joint, bilateral: Secondary | ICD-10-CM | POA: Insufficient documentation

## 2023-12-10 DIAGNOSIS — W19XXXA Unspecified fall, initial encounter: Secondary | ICD-10-CM

## 2023-12-10 LAB — CBC WITH DIFFERENTIAL/PLATELET
Abs Immature Granulocytes: 0.03 10*3/uL (ref 0.00–0.07)
Basophils Absolute: 0 10*3/uL (ref 0.0–0.1)
Basophils Relative: 0 %
Eosinophils Absolute: 0.1 10*3/uL (ref 0.0–0.5)
Eosinophils Relative: 1 %
HCT: 41.1 % (ref 36.0–46.0)
Hemoglobin: 13.7 g/dL (ref 12.0–15.0)
Immature Granulocytes: 0 %
Lymphocytes Relative: 5 %
Lymphs Abs: 0.4 10*3/uL — ABNORMAL LOW (ref 0.7–4.0)
MCH: 29.7 pg (ref 26.0–34.0)
MCHC: 33.3 g/dL (ref 30.0–36.0)
MCV: 89 fL (ref 80.0–100.0)
Monocytes Absolute: 0.3 10*3/uL (ref 0.1–1.0)
Monocytes Relative: 4 %
Neutro Abs: 7.4 10*3/uL (ref 1.7–7.7)
Neutrophils Relative %: 90 %
Platelets: 177 10*3/uL (ref 150–400)
RBC: 4.62 MIL/uL (ref 3.87–5.11)
RDW: 13.9 % (ref 11.5–15.5)
WBC: 8.3 10*3/uL (ref 4.0–10.5)
nRBC: 0 % (ref 0.0–0.2)

## 2023-12-10 LAB — URINALYSIS, ROUTINE W REFLEX MICROSCOPIC
Bilirubin Urine: NEGATIVE
Glucose, UA: NEGATIVE mg/dL
Ketones, ur: NEGATIVE mg/dL
Nitrite: POSITIVE — AB
Protein, ur: NEGATIVE mg/dL
Specific Gravity, Urine: 1.005 (ref 1.005–1.030)
Squamous Epithelial / HPF: 0 /HPF (ref 0–5)
pH: 6 (ref 5.0–8.0)

## 2023-12-10 LAB — COMPREHENSIVE METABOLIC PANEL WITH GFR
ALT: 10 U/L (ref 0–44)
AST: 25 U/L (ref 15–41)
Albumin: 3.5 g/dL (ref 3.5–5.0)
Alkaline Phosphatase: 76 U/L (ref 38–126)
Anion gap: 8 (ref 5–15)
BUN: 14 mg/dL (ref 8–23)
CO2: 26 mmol/L (ref 22–32)
Calcium: 8.9 mg/dL (ref 8.9–10.3)
Chloride: 101 mmol/L (ref 98–111)
Creatinine, Ser: 1.12 mg/dL — ABNORMAL HIGH (ref 0.44–1.00)
GFR, Estimated: 49 mL/min — ABNORMAL LOW (ref >60)
Glucose, Bld: 82 mg/dL (ref 70–99)
Potassium: 4.5 mmol/L (ref 3.5–5.1)
Sodium: 135 mmol/L (ref 135–145)
Total Bilirubin: 1.7 mg/dL — ABNORMAL HIGH (ref 0.0–1.2)
Total Protein: 7.2 g/dL (ref 6.5–8.1)

## 2023-12-10 MED ORDER — CEFUROXIME AXETIL 250 MG PO TABS
250.0000 mg | ORAL_TABLET | Freq: Two times a day (BID) | ORAL | 0 refills | Status: DC
Start: 2023-12-10 — End: 2024-02-07

## 2023-12-10 NOTE — ED Notes (Signed)
 Pt states that she is uncertain what caused her to fall, states that her feet may have gotten stuck as she was walking, denies hitting her head, denies feeling dizzy, sob, or chest pain prior to falling, reports that she was using her walker. Denies blood thinner use, pt has bruising on her arms and legs and states that they have been there awhile, pt is c/o right elbow pain and states that she hit her buttocks. States that she has had a few falls recently

## 2023-12-10 NOTE — ED Notes (Signed)
 This RN attempted to call patients emergency contact Jesusa Moros at this time without success. This RN left a voicemail instructing  the contact to give a call back at her earliest convenience as the patient is being discharged.

## 2023-12-10 NOTE — ED Provider Notes (Signed)
 [facility] Provider Note   Event Date/Time   First MD Initiated Contact with Patient 12/10/23 [phone]     (approximate) History  Fall (/)  HPI [name] is a 83 y.o. female past medical history of bilateral hip replacements, hypertension, and anemia who presents after a mechanical fall from standing.  Patient arrives via EMS complaining of bilateral hip pain after she fell onto her buttocks today.  Patient states she normally walks with a walker and has had increasing falls over the last few days.  Patient denies any dark tarry stools, dehydration, vomiting, diarrhea, or decreased p.o. intake. ROS: Patient currently denies any vision changes, tinnitus, difficulty speaking, facial droop, sore throat, chest pain, shortness of breath, abdominal pain, nausea/vomiting/diarrhea, dysuria, or weakness/numbness/paresthesias in any extremity   Physical Exam  Triage Vital Signs: ED Triage Vitals  Encounter Vitals Group     BP 12/10/23 0828 (!) 132/90     Systolic BP Percentile --      Diastolic BP Percentile --      Pulse Rate 12/10/23 0828 77     Resp 12/10/23 0828 13     Temp 12/10/23 0828 98.3 F (36.8 C)     Temp Source 12/10/23 0828 Oral     SpO2 12/10/23 0828 100 %     Weight 12/10/23 0832 113 lb 1.5 oz (51.3 kg)     Height 12/10/23 0832 5\' 8"  (1.727 m)     Head Circumference --      Peak Flow --      Pain Score 12/10/23 0832 4     Pain Loc --      Pain Education --      Exclude from Growth Chart --    Most recent vital signs: Vitals:   12/10/23 1245 12/10/23 1430  BP:  (!) 157/68  Pulse: 81 69  Resp:  16  Temp:  98 F (36.7 C)  SpO2: 100% 100%   General: Awake, oriented x4. CV:  Good peripheral perfusion.  Resp:  Normal effort.  Abd:  No distention.  Other:  Elderly well-developed, well-nourished Caucasian female resting comfortably in no acute distress ED Results / Procedures / Treatments  Labs (all labs ordered are listed, but only  abnormal results are displayed) Labs Reviewed  COMPREHENSIVE METABOLIC PANEL WITH GFR - Abnormal; Notable for the following components:      Result Value   Creatinine, Ser 1.12 (*)    Total Bilirubin 1.7 (*)    GFR, Estimated 49 (*)    All other components within normal limits  CBC WITH DIFFERENTIAL/PLATELET - Abnormal; Notable for the following components:   Lymphs Abs 0.4 (*)    All other components within normal limits  URINALYSIS, ROUTINE W REFLEX MICROSCOPIC - Abnormal; Notable for the following components:   Color, Urine YELLOW (*)    APPearance HAZY (*)    Hgb urine dipstick SMALL (*)    Nitrite POSITIVE (*)    Leukocytes,Ua SMALL (*)    Bacteria, UA RARE (*)    All other components within normal limits  RADIOLOGY ED MD interpretation: CT of the right hip shows no acute osseous abnormalities.  There is a remote healed fracture deformity of the right inferior pubic ramus with marginal bridging callus formation since 03/11/2017.  There is also a remote appearing bridging calculus on the posterior aspect of the proximal right superior pubic ramus.  This imaging was independently interpreted X-ray of bilateral hips and pelvis independently interpreted and  shows cortical irregularity about the right superior and inferior pubic rami suspicious for nondisplaced fracture.  There is also bilateral hip arthroplasties without complication -Agree with radiology assessment Official radiology report(s): CT Hip Right Wo Contrast Result Date: 12/10/2023 CLINICAL DATA:  Hip pain after fall. EXAM: CT OF THE RIGHT HIP WITHOUT CONTRAST TECHNIQUE: Multidetector CT imaging of the right hip was performed according to the standard protocol. Multiplanar CT image reconstructions were also generated. RADIATION DOSE REDUCTION: This exam was performed according to the departmental dose-optimization program which includes automated exposure control, adjustment of the mA and/or kV according to patient size and/or  use of iterative reconstruction technique. COMPARISON:  Right hip radiographs dated 12/10/2023. CT abdomen/pelvis dated 03/11/2017. FINDINGS: Bones/Joint/Cartilage Prior right hip arthroplasty. The femoral and acetabular components are well seated with stable alignment. Remote healed fracture deformity of the right inferior pubic ramus with marginal bridging callus formation, new since the prior exam dated 03/11/2017. Remote appearing bridging callus along the posterior aspect of the proximal right superior pubic ramus. No definite fracture identified, although evaluation is limited due to streak artifact from indwelling orthopedic hardware. The right sacroiliac joint and pubic symphysis are anatomically aligned with degenerative changes. Ligaments Ligaments are suboptimally evaluated by CT. Muscles and Tendons Muscles are within normal limits. No discrete intramuscular collection identified. Soft tissue Soft tissue swelling along the right lateral hip. Evaluation is limited due to streak artifact. No loculated fluid collection identified. No enlarged lymph nodes identified in the field of view. IMPRESSION: 1. No acute osseous abnormality identified, although evaluation is limited due to streak artifact from intact right hip arthroplasty. 2. Remote healed fracture deformity of the right inferior pubic ramus with marginal bridging callus formation, new since the prior exam dated 03/11/2017. 3. Remote appearing bridging callus along the posterior aspect of the proximal right superior pubic ramus. Electronically Signed   By: Mannie Seek M.D.   On: 12/10/2023 11:50   DG HIPS BILAT WITH PELVIS MIN 5 VIEWS Result Date: 12/10/2023 CLINICAL DATA:  Pain after fall. EXAM: DG HIP (WITH OR WITHOUT PELVIS) 5+V BILAT COMPARISON:  08/31/2015 FINDINGS: The bones are subjectively under mineralized. Bilateral hip arthroplasties. No periprosthetic lucency. No periprosthetic fracture. There is cortical irregularity about the  right superior and inferior pubic rami suspicious for nondisplaced fracture. No pubic symphyseal involvement. Pubic symphysis and sacroiliac joints are congruent. Stool distends the rectum. IMPRESSION: 1. Cortical irregularity about the right superior and inferior pubic rami suspicious for nondisplaced fracture. 2. Bilateral hip arthroplasties without complication. Electronically Signed   By: Chadwick Colonel M.D.   On: 12/10/2023 10:22   PROCEDURES: Critical Care performed: No Procedures MEDICATIONS ORDERED IN ED: Medications - No data to display IMPRESSION / MDM / ASSESSMENT AND PLAN / ED COURSE  I reviewed the triage vital signs and the nursing notes.                             The patient is on the cardiac monitor to evaluate for evidence of arrhythmia and/or significant heart rate changes. Patient's presentation is most consistent with acute presentation with potential threat to life or bodily function. Presenting after a fall that occurred just prior to arrival, resulting in injury to the right hip and buttocks. The mechanism of injury was a mechanical ground level fall without syncope or near-syncope. The current level of pain is moderate. There was no loss of consciousness, confusion, seizure, or memory impairment. There is not  a laceration associated with the injury. Denies neck pain. The patient does not take blood thinner medications. Denies vomiting, numbness/weakness, fever Patient just with signs of urinary tract infection on UA and will treat empirically with Ceftin  Dispo: Discharge with PCP follow-up       FINAL CLINICAL IMPRESSION(S) / ED DIAGNOSES   Final diagnoses:  Fall, initial encounter  Acute right hip pain  Urinary tract infection with hematuria, site unspecified   Rx / DC Orders   ED Discharge Orders          Ordered    cefUROXime  (CEFTIN ) 250 MG tablet  2 times daily with meals        12/10/23 1444           Note:  This document was prepared  using Dragon voice recognition software and may include unintentional dictation errors.   Charleen Conn, MD 12/10/23 [phone]

## 2023-12-10 NOTE — ED Triage Notes (Signed)
 Presents via EMS from home s/p fall   States she was using her walker and fell backwards  Having pain to right elbow and lower back  Was able to ambulate to stretcher

## 2024-01-11 ENCOUNTER — Emergency Department
Admission: EM | Admit: 2024-01-11 | Discharge: 2024-01-11 | Disposition: A | Discharge status: Home / Self Care | Attending: Emergency Medicine | Admitting: Emergency Medicine

## 2024-01-11 ENCOUNTER — Encounter: Payer: Self-pay | Admitting: Oncology

## 2024-01-11 ENCOUNTER — Encounter: Payer: Self-pay | Admitting: Emergency Medicine

## 2024-01-11 ENCOUNTER — Other Ambulatory Visit: Payer: Self-pay

## 2024-01-11 DIAGNOSIS — N179 Acute kidney failure, unspecified: Secondary | ICD-10-CM | POA: Diagnosis not present

## 2024-01-11 DIAGNOSIS — Z87440 Personal history of urinary (tract) infections: Secondary | ICD-10-CM | POA: Diagnosis not present

## 2024-01-11 DIAGNOSIS — R8290 Unspecified abnormal findings in urine: Secondary | ICD-10-CM | POA: Insufficient documentation

## 2024-01-11 DIAGNOSIS — M069 Rheumatoid arthritis, unspecified: Secondary | ICD-10-CM | POA: Diagnosis not present

## 2024-01-11 DIAGNOSIS — D849 Immunodeficiency, unspecified: Secondary | ICD-10-CM | POA: Insufficient documentation

## 2024-01-11 DIAGNOSIS — R55 Syncope and collapse: Secondary | ICD-10-CM | POA: Diagnosis present

## 2024-01-11 LAB — CBC
HCT: 39.5 % (ref 36.0–46.0)
Hemoglobin: 13 g/dL (ref 12.0–15.0)
MCH: 29.7 pg (ref 26.0–34.0)
MCHC: 32.9 g/dL (ref 30.0–36.0)
MCV: 90.4 fL (ref 80.0–100.0)
Platelets: 163 10*3/uL (ref 150–400)
RBC: 4.37 MIL/uL (ref 3.87–5.11)
RDW: 14.1 % (ref 11.5–15.5)
WBC: 5.7 10*3/uL (ref 4.0–10.5)
nRBC: 0 % (ref 0.0–0.2)

## 2024-01-11 LAB — URINALYSIS, ROUTINE W REFLEX MICROSCOPIC
Bilirubin Urine: NEGATIVE
Glucose, UA: NEGATIVE mg/dL
Hgb urine dipstick: NEGATIVE
Ketones, ur: NEGATIVE mg/dL
Leukocytes,Ua: NEGATIVE
Nitrite: NEGATIVE
Protein, ur: NEGATIVE mg/dL
Specific Gravity, Urine: 1.012 (ref 1.005–1.030)
pH: 5 (ref 5.0–8.0)

## 2024-01-11 LAB — COMPREHENSIVE METABOLIC PANEL WITH GFR
ALT: 8 U/L (ref 0–44)
AST: 19 U/L (ref 15–41)
Albumin: 3.6 g/dL (ref 3.5–5.0)
Alkaline Phosphatase: 77 U/L (ref 38–126)
Anion gap: 10 (ref 5–15)
BUN: 15 mg/dL (ref 8–23)
CO2: 23 mmol/L (ref 22–32)
Calcium: 8.8 mg/dL — ABNORMAL LOW (ref 8.9–10.3)
Chloride: 97 mmol/L — ABNORMAL LOW (ref 98–111)
Creatinine, Ser: 1.69 mg/dL — ABNORMAL HIGH (ref 0.44–1.00)
GFR, Estimated: 30 mL/min — ABNORMAL LOW (ref >60)
Glucose, Bld: 96 mg/dL (ref 70–99)
Potassium: 4.1 mmol/L (ref 3.5–5.1)
Sodium: 130 mmol/L — ABNORMAL LOW (ref 135–145)
Total Bilirubin: 1.1 mg/dL (ref 0.0–1.2)
Total Protein: 7 g/dL (ref 6.5–8.1)

## 2024-01-11 LAB — TROPONIN I (HIGH SENSITIVITY)
Troponin I (High Sensitivity): 6 ng/L (ref <18)
Troponin I (High Sensitivity): 6 ng/L (ref <18)

## 2024-01-11 MED ORDER — SODIUM CHLORIDE 0.9 % IV BOLUS
500.0000 mL | Freq: Once | INTRAVENOUS | Status: AC
  Administered 2024-01-11: 500 mL via INTRAVENOUS

## 2024-01-11 NOTE — ED Triage Notes (Signed)
 Pt via [facility] from [employer] due to feeling faint when standing. Hx RA with infusions q8w, most recent on 5/13. Pt a/o x 4.  HR 73 BP 126/57 O2 99% RA CBG 99 T 98.2 EKG unremarkable  Pt denies pain and states she on,ly feels dizzy when standing.

## 2024-01-11 NOTE — Discharge Instructions (Addendum)
 Stop taking Bactrim- sulfamethoxazole-trimethoprim This could be contributing to worsening kidney function and lead to some lightheadedness. Drink plenty of fluids including Gatorade without sugar, Pedialyte Return to the ER if he develops worsening lightheadedness or any other concerns.  Follow-up with your primary care doctor for recheck of your kidney function and follow-up with cardiology for echocardiogram as needed.

## 2024-01-11 NOTE — ED Provider Notes (Signed)
 [facility] Provider Note    Event Date/Time   First MD Initiated Contact with Patient 01/11/24 1132     (approximate)   History   Near Syncope   HPI  [name] is a 83 y.o. female with rheumatoid arthritis who comes in with concerns for near syncopal.  Patient reports that she was at the [employer] when she was standing up and started to feel faint.  She denies passing out hitting her head or any other concerns.  She does report getting infusions for her rheumatoid arthritis.  She denies any chest pain, shortness of breath, abdominal pain.  She really denies any symptoms laying still.  She reports only a little bit of mild light headness when she tries to stand up that she may pass out.  She reports eating and drinking okay.  Denies this happening previously  Patient is on methotrexate   Physical Exam   Triage Vital Signs: ED Triage Vitals  Encounter Vitals Group     BP 01/11/24 1134 (!) 130/56     Systolic BP Percentile --      Diastolic BP Percentile --      Pulse Rate 01/11/24 1134 82     Resp 01/11/24 1134 16     Temp 01/11/24 1134 97.8 F (36.6 C)     Temp Source 01/11/24 1134 Oral     SpO2 01/11/24 1134 100 %     Weight 01/11/24 1133 107 lb (48.5 kg)     Height 01/11/24 1133 5\' 7"  (1.702 m)     Head Circumference --      Peak Flow --      Pain Score 01/11/24 1133 0     Pain Loc --      Pain Education --      Exclude from Growth Chart --     Most recent vital signs: Vitals:   01/11/24 1134  BP: (!) 130/56  Pulse: 82  Resp: 16  Temp: 97.8 F (36.6 C)  SpO2: 100%     General: Awake, no distress.  CV:  Good peripheral perfusion.  Resp:  Normal effort.  Abd:  No distention.  Soft and nontender Other:  Patient has contractures noted of finger secondary to her known rheumatoid arthritis.  Her cranial nerves II to XII are intact.  Equal strength in arms and legs.  Finger-to-nose intact bilaterally   ED Results / Procedures /  Treatments   Labs (all labs ordered are listed, but only abnormal results are displayed) Labs Reviewed  CBC  COMPREHENSIVE METABOLIC PANEL WITH GFR  URINALYSIS, ROUTINE W REFLEX MICROSCOPIC  CBG MONITORING, ED  TROPONIN I (HIGH SENSITIVITY)     EKG  My interpretation of EKG:  Ectopic atrial rhythm with a rate of 78 without any ST elevation or T wave inversions, normal intervals     PROCEDURES:  Critical Care performed: No  .1-3 Lead EKG Interpretation  Performed by: Lubertha Rush, MD Authorized by: Lubertha Rush, MD     Interpretation: abnormal     ECG rate:  80   Rhythm comment:  Ectopic atrial   Ectopy: none     Conduction: normal      MEDICATIONS ORDERED IN ED: Medications  sodium chloride  0.9 % bolus 500 mL (500 mLs Intravenous New Bag/Given 01/11/24 1223)     IMPRESSION / MDM / ASSESSMENT AND PLAN / ED COURSE  I reviewed the triage vital signs and the nursing notes.  Patient's presentation is most consistent with acute presentation with potential threat to life or bodily function.   Patient comes in with concerns for near syncopal episode.  Will check for orthostatics and get labs evaluate for ACS, Electra abnormalities, AKI EKG does show some inverted P waves consistent with ectopic atrial rhythm.  I do not see that patient's had any recent echocardiogram. No signs of posterior stroke on exam- asymptomatic at rest.  Patient is on immunosuppression for her rheumatoid arthritis but she has no fever no white count no signs for active infection  Orthostatics are negative  Discussed with Dr Veryl Gottron from cardiology- She compared EKG to the old one. There is short PR, but her PR was short on the last one, so it isn't significantly different in that way. The P axis in II is different, which is why it's probably reading as ectopic. Unless she's got a murmur (specifically AS or LVOT) the echo probably isn't going to show anything that might be a possible  etiology.   1:51 PM I discussed her EKG with patient and she has no murmur that is appreciated on examination.  We discussed admission for echocardiogram, AKI as I do suspect that the majority of the symptoms are from her AKI given she does report that is worse with standing.  Patient states that she would prefer to go home.  That she wants to try to get some more fluid and ambulate and see if she is feeling better she would prefer to be discharged and will follow-up outpatient for echocardiogram.  Given nomurmur and the cardiac markers are negative x 2 patient can be discharged if she is ambulating without any significant symptoms  After discussion further with patient she also reports that she has been taking an antibiotic for uti Bactrim. On ROR it looks like bactrim was prescribed for 5 days on 5/19 which I suspect could be contributing to her AKI and more likely the cause of patient's symptoms.  She is day 5 today but she states she still has some pills left.  We discussed that typical treatment for UTI can be 3 days not 5 days and given her urine is negative today I recommended her holding off on the Bactrim.  I will send this for culture in case there is some partial treatment and that she may need additional antibiotics but at this time we will have her hold her Bactrim.  Patient expressed understanding felt comfortable with that plan  2:42 PM reevaluated patient she is ambulatory without any symptoms she would prefer discharge home.  If repeat troponin is negative patient will be discharged  The patient is on the cardiac monitor to evaluate for evidence of arrhythmia and/or significant heart rate changes.      FINAL CLINICAL IMPRESSION(S) / ED DIAGNOSES   Final diagnoses:  Near syncope  AKI (acute kidney injury) (HCC)     Rx / DC Orders   ED Discharge Orders     None        Note:  This document was prepared using Dragon voice recognition software and may include unintentional  dictation errors.   Lubertha Rush, MD 01/11/24 [phone]

## 2024-01-11 NOTE — ED Notes (Signed)
 Pt ambulatory to and from toilet with standby assist x 2, no difficulty and no orthostatic symptoms noted or reported. She states that she feels normal at this time regardless of position or activity. MD notified.

## 2024-01-11 NOTE — ED Provider Notes (Signed)
.-----------------------------------------   3:07 PM on 01/11/2024 -----------------------------------------  Blood pressure (!) 144/68, pulse 77, temperature 97.8 F (36.6 C), temperature source Oral, resp. rate (!) 27, height 5\' 7"  (1.702 m), weight 48.5 kg, SpO2 100%.  Assuming care from Dr. Peggi Bowels.  In short, [name] is a 83 y.o. female with a chief complaint of Near Syncope .  Refer to the original H&P for additional details.  The current plan of care is to repeat trop, dc after.  Repeat troponin is not elevated, patient had completed IV fluids.  Discussion was done by prior ED physician who offered admission but patient had declined.  Wants to go home.  Will discharge with strict return precautions and instructions to follow-up with a primary care doctor for further management of her symptoms.       Shane Darling, MD 01/11/24 [phone]

## 2024-01-12 LAB — URINE CULTURE: Culture: <10000 — AB

## 2024-02-07 ENCOUNTER — Emergency Department

## 2024-02-07 ENCOUNTER — Inpatient Hospital Stay
Admission: EM | Admit: 2024-02-07 | Discharge: 2024-03-11 | DRG: 564 | Disposition: A | Discharge status: Skilled Nursing Facility | Attending: Osteopathic Medicine | Admitting: Osteopathic Medicine

## 2024-02-07 ENCOUNTER — Other Ambulatory Visit: Payer: Self-pay

## 2024-02-07 DIAGNOSIS — M069 Rheumatoid arthritis, unspecified: Secondary | ICD-10-CM | POA: Diagnosis present

## 2024-02-07 DIAGNOSIS — Z7960 Long term (current) use of unspecified immunomodulators and immunosuppressants: Secondary | ICD-10-CM

## 2024-02-07 DIAGNOSIS — Z7983 Long term (current) use of bisphosphonates: Secondary | ICD-10-CM

## 2024-02-07 DIAGNOSIS — K5900 Constipation, unspecified: Secondary | ICD-10-CM | POA: Diagnosis present

## 2024-02-07 DIAGNOSIS — Y92008 Other place in unspecified non-institutional (private) residence as the place of occurrence of the external cause: Secondary | ICD-10-CM

## 2024-02-07 DIAGNOSIS — E43 Unspecified severe protein-calorie malnutrition: Secondary | ICD-10-CM | POA: Diagnosis present

## 2024-02-07 DIAGNOSIS — Z7952 Long term (current) use of systemic steroids: Secondary | ICD-10-CM

## 2024-02-07 DIAGNOSIS — Z96643 Presence of artificial hip joint, bilateral: Secondary | ICD-10-CM | POA: Diagnosis present

## 2024-02-07 DIAGNOSIS — R296 Repeated falls: Secondary | ICD-10-CM | POA: Diagnosis present

## 2024-02-07 DIAGNOSIS — M6282 Rhabdomyolysis: Secondary | ICD-10-CM | POA: Diagnosis not present

## 2024-02-07 DIAGNOSIS — Z681 Body mass index (BMI) 19 or less, adult: Secondary | ICD-10-CM

## 2024-02-07 DIAGNOSIS — B9620 Unspecified Escherichia coli [E. coli] as the cause of diseases classified elsewhere: Secondary | ICD-10-CM | POA: Diagnosis present

## 2024-02-07 DIAGNOSIS — U071 COVID-19: Secondary | ICD-10-CM | POA: Diagnosis not present

## 2024-02-07 DIAGNOSIS — Z9225 Personal history of immunosupression therapy: Secondary | ICD-10-CM

## 2024-02-07 DIAGNOSIS — Z79890 Hormone replacement therapy (postmenopausal): Secondary | ICD-10-CM

## 2024-02-07 DIAGNOSIS — Z8261 Family history of arthritis: Secondary | ICD-10-CM

## 2024-02-07 DIAGNOSIS — R413 Other amnesia: Secondary | ICD-10-CM | POA: Insufficient documentation

## 2024-02-07 DIAGNOSIS — Z800 Family history of malignant neoplasm of digestive organs: Secondary | ICD-10-CM

## 2024-02-07 DIAGNOSIS — I129 Hypertensive chronic kidney disease with stage 1 through stage 4 chronic kidney disease, or unspecified chronic kidney disease: Secondary | ICD-10-CM | POA: Diagnosis present

## 2024-02-07 DIAGNOSIS — Z751 Person awaiting admission to adequate facility elsewhere: Secondary | ICD-10-CM

## 2024-02-07 DIAGNOSIS — Z8673 Personal history of transient ischemic attack (TIA), and cerebral infarction without residual deficits: Secondary | ICD-10-CM

## 2024-02-07 DIAGNOSIS — N3000 Acute cystitis without hematuria: Secondary | ICD-10-CM | POA: Diagnosis present

## 2024-02-07 DIAGNOSIS — E509 Vitamin A deficiency, unspecified: Secondary | ICD-10-CM | POA: Diagnosis present

## 2024-02-07 DIAGNOSIS — B961 Klebsiella pneumoniae [K. pneumoniae] as the cause of diseases classified elsewhere: Secondary | ICD-10-CM | POA: Diagnosis present

## 2024-02-07 DIAGNOSIS — E039 Hypothyroidism, unspecified: Secondary | ICD-10-CM | POA: Diagnosis present

## 2024-02-07 DIAGNOSIS — N179 Acute kidney failure, unspecified: Secondary | ICD-10-CM | POA: Diagnosis present

## 2024-02-07 DIAGNOSIS — L89313 Pressure ulcer of right buttock, stage 3: Secondary | ICD-10-CM | POA: Diagnosis present

## 2024-02-07 DIAGNOSIS — F4320 Adjustment disorder, unspecified: Secondary | ICD-10-CM | POA: Diagnosis present

## 2024-02-07 DIAGNOSIS — Z7982 Long term (current) use of aspirin: Secondary | ICD-10-CM

## 2024-02-07 DIAGNOSIS — D84821 Immunodeficiency due to drugs: Secondary | ICD-10-CM | POA: Diagnosis present

## 2024-02-07 DIAGNOSIS — L89320 Pressure ulcer of left buttock, unstageable: Secondary | ICD-10-CM | POA: Diagnosis present

## 2024-02-07 DIAGNOSIS — T796XXA Traumatic ischemia of muscle, initial encounter: Secondary | ICD-10-CM | POA: Diagnosis not present

## 2024-02-07 DIAGNOSIS — N1831 Chronic kidney disease, stage 3a: Secondary | ICD-10-CM | POA: Diagnosis present

## 2024-02-07 DIAGNOSIS — L8990 Pressure ulcer of unspecified site, unspecified stage: Secondary | ICD-10-CM | POA: Insufficient documentation

## 2024-02-07 DIAGNOSIS — W1839XA Other fall on same level, initial encounter: Secondary | ICD-10-CM | POA: Diagnosis present

## 2024-02-07 DIAGNOSIS — Z23 Encounter for immunization: Secondary | ICD-10-CM

## 2024-02-07 DIAGNOSIS — Z8249 Family history of ischemic heart disease and other diseases of the circulatory system: Secondary | ICD-10-CM

## 2024-02-07 DIAGNOSIS — R54 Age-related physical debility: Secondary | ICD-10-CM | POA: Diagnosis present

## 2024-02-07 DIAGNOSIS — W19XXXA Unspecified fall, initial encounter: Principal | ICD-10-CM

## 2024-02-07 DIAGNOSIS — E871 Hypo-osmolality and hyponatremia: Secondary | ICD-10-CM | POA: Diagnosis present

## 2024-02-07 LAB — CBC
HCT: 37.7 % (ref 36.0–46.0)
Hemoglobin: 12.4 g/dL (ref 12.0–15.0)
MCH: 30.5 pg (ref 26.0–34.0)
MCHC: 32.9 g/dL (ref 30.0–36.0)
MCV: 92.9 fL (ref 80.0–100.0)
Platelets: 162 10*3/uL (ref 150–400)
RBC: 4.06 MIL/uL (ref 3.87–5.11)
RDW: 14.2 % (ref 11.5–15.5)
WBC: 4.2 10*3/uL (ref 4.0–10.5)
nRBC: 0 % (ref 0.0–0.2)

## 2024-02-07 LAB — URINALYSIS, W/ REFLEX TO CULTURE (INFECTION SUSPECTED)
Bilirubin Urine: NEGATIVE
Glucose, UA: NEGATIVE mg/dL
Hgb urine dipstick: NEGATIVE
Ketones, ur: 5 mg/dL — AB
Nitrite: NEGATIVE
Protein, ur: 100 mg/dL — AB
Specific Gravity, Urine: 1.018 (ref 1.005–1.030)
Squamous Epithelial / HPF: >50 /HPF (ref 0–5)
pH: 6 (ref 5.0–8.0)

## 2024-02-07 LAB — COMPREHENSIVE METABOLIC PANEL WITH GFR
ALT: 14 U/L (ref 0–44)
AST: 43 U/L — ABNORMAL HIGH (ref 15–41)
Albumin: 3.3 g/dL — ABNORMAL LOW (ref 3.5–5.0)
Alkaline Phosphatase: 62 U/L (ref 38–126)
Anion gap: 15 (ref 5–15)
BUN: 47 mg/dL — ABNORMAL HIGH (ref 8–23)
CO2: 19 mmol/L — ABNORMAL LOW (ref 22–32)
Calcium: 8.6 mg/dL — ABNORMAL LOW (ref 8.9–10.3)
Chloride: 107 mmol/L (ref 98–111)
Creatinine, Ser: 1.85 mg/dL — ABNORMAL HIGH (ref 0.44–1.00)
GFR, Estimated: 27 mL/min — ABNORMAL LOW (ref >60)
Glucose, Bld: 147 mg/dL — ABNORMAL HIGH (ref 70–99)
Potassium: 4.1 mmol/L (ref 3.5–5.1)
Sodium: 141 mmol/L (ref 135–145)
Total Bilirubin: 2.3 mg/dL — ABNORMAL HIGH (ref 0.0–1.2)
Total Protein: 7.2 g/dL (ref 6.5–8.1)

## 2024-02-07 LAB — CK: Total CK: 1195 U/L — ABNORMAL HIGH (ref 38–234)

## 2024-02-07 LAB — TROPONIN I (HIGH SENSITIVITY)
Troponin I (High Sensitivity): 17 ng/L (ref <18)
Troponin I (High Sensitivity): 26 ng/L — ABNORMAL HIGH (ref <18)

## 2024-02-07 MED ORDER — MORPHINE SULFATE (PF) 2 MG/ML IV SOLN: 2.0000 mg | INTRAVENOUS | Status: DC | PRN

## 2024-02-07 MED ORDER — ONDANSETRON HCL 4 MG/2ML IJ SOLN: 4.0000 mg | Freq: Four times a day (QID) | INTRAMUSCULAR | Status: DC | PRN

## 2024-02-07 MED ORDER — ONDANSETRON HCL 4 MG/2ML IJ SOLN
4.0000 mg | Freq: Once | INTRAMUSCULAR | Status: AC
  Administered 2024-02-07: 4 mg via INTRAVENOUS
  Filled 2024-02-07: qty 2

## 2024-02-07 MED ORDER — ENOXAPARIN SODIUM 30 MG/0.3ML IJ SOSY
30.0000 mg | PREFILLED_SYRINGE | INTRAMUSCULAR | Status: DC
  Administered 2024-02-07 – 2024-02-08 (×2): 30 mg via SUBCUTANEOUS
  Filled 2024-02-07 (×2): qty 0.3

## 2024-02-07 MED ORDER — SODIUM CHLORIDE 0.9 % IV BOLUS
2000.0000 mL | Freq: Once | INTRAVENOUS | Status: AC
  Administered 2024-02-07: 2000 mL via INTRAVENOUS

## 2024-02-07 MED ORDER — ONDANSETRON HCL 4 MG PO TABS
4.0000 mg | ORAL_TABLET | Freq: Four times a day (QID) | ORAL | Status: DC | PRN
  Administered 2024-02-28: 4 mg via ORAL
  Filled 2024-02-07: qty 1

## 2024-02-07 MED ORDER — ACETAMINOPHEN 325 MG PO TABS
650.0000 mg | ORAL_TABLET | Freq: Four times a day (QID) | ORAL | Status: DC | PRN
  Administered 2024-02-28 – 2024-02-29 (×2): 650 mg via ORAL
  Filled 2024-02-07 (×3): qty 2

## 2024-02-07 MED ORDER — ACETAMINOPHEN 650 MG RE SUPP: 650.0000 mg | Freq: Four times a day (QID) | RECTAL | Status: DC | PRN

## 2024-02-07 MED ORDER — HYDROCODONE-ACETAMINOPHEN 5-325 MG PO TABS
1.0000 | ORAL_TABLET | ORAL | Status: DC | PRN
  Administered 2024-02-15: 2 via ORAL
  Administered 2024-02-22 – 2024-02-26 (×2): 1 via ORAL
  Filled 2024-02-07 (×3): qty 2
  Filled 2024-02-07: qty 1

## 2024-02-07 NOTE — H&P (Signed)
 History and Physical    Patient: [name] FMW:[id number] DOB: 1941-06-16 DOA: 02/07/2024 DOS: the patient was seen and examined on 02/07/2024 PCP: Glover Lenis, MD  Patient coming from: Home  Chief Complaint:  Chief Complaint  Patient presents with   Fall    HPI: [name] is a 83 y.o. female with medical history significant for Hypothyroidism, rheumatoid arthritis on chronic prednisone and Renflexis  infusions, followed by rheumatology, osteoarthritis, PCP documentation of several falls requiring ED visits, and who currently uses a walker, who is being admitted for rhabdomyolysis after she was found down by a neighbor.  She was on the floor for over 24 hours. In the ED she had a low-grade temperature of 99.1 with otherwise normal vitals.  Labs showed normal CBC, Creatinine of 1.85 up from baseline of 1.1 to with bicarb of 19.  Mild LFT elevation with AST 43 and total bilirubin 2.7.  Troponin 17.  Urinalysis with many bacteria and small leuks.  EKG showed sinus arrhythmia at 88 CT head and C-spine without acute injury.  Chest x-ray nonacute showing multiple degenerative changes x-ray of the knee showing moderate right knee effusion among other findings-please see report. Patient treated with 2 L NS bolus and pain medicine.  Admission requested.     Review of Systems: As mentioned in the history of present illness. All other systems reviewed and are negative.  Past Medical History:  Diagnosis Date   Anemia    Arthritis    Hypertension    Past Surgical History:  Procedure Laterality Date   APPENDECTOMY     JOINT REPLACEMENT Right 2014   THR   TONSILLECTOMY     TOTAL HIP ARTHROPLASTY Left 08/31/2015   Procedure: TOTAL HIP ARTHROPLASTY;  Surgeon: Lynwood SHAUNNA Hue, MD;  Location: [facility] ORS;  Service: Orthopedics;  Laterality: Left;   Social History:  reports that she has never smoked. She has never used smokeless tobacco. She reports that she does not drink alcohol and does  not use drugs.  No Known Allergies  Family History  Problem Relation Age of Onset   Pancreatic cancer Mother    Rheum arthritis Father    Congestive Heart Failure Father    Pancreatic cancer Maternal Aunt    Heart attack Maternal Grandfather    Arthritis Paternal Grandmother    Arthritis Paternal Grandfather     Prior to Admission medications   Medication Sig Start Date End Date Taking? Authorizing Provider  acetaminophen  (TYLENOL ) 500 MG tablet Take 500 mg by mouth 2 (two) times daily.    Yes [provider]  alendronate (FOSAMAX) 70 MG tablet Take 70 mg by mouth once a week. 03/30/20  Yes [provider]  aspirin  EC 81 MG tablet Take 81 mg by mouth daily.    Yes [provider]  cholecalciferol (VITAMIN D) 1000 units tablet Take 1,000 Units by mouth daily.   Yes [provider]  ferrous sulfate  324 (65 Fe) MG TBEC Take 1 tablet by mouth daily.    Yes [provider]  folic acid  (FOLVITE ) 1 MG tablet Take 1 mg by mouth daily.   Yes [provider]  levothyroxine (SYNTHROID) 75 MCG tablet TAKE 1 TABLET(75 MCG) BY MOUTH EVERY DAY 30 TO 60 MINUTES BEFORE BREAKFAST ON AN EMPTY STOMACH AND WITH A GLASS OF WATER 11/03/19  Yes [provider]  methotrexate (RHEUMATREX) 2.5 MG tablet Take 17.5 mg by mouth once a week. Caution:Chemotherapy. Protect from light.   Yes [provider]  predniSONE (DELTASONE) 5 MG tablet Take 5 mg by mouth daily with breakfast. 11/14/23  Yes [provider]  vitamin B-12 (CYANOCOBALAMIN ) 1000 MCG tablet Take 1,000 mcg by mouth daily.   Yes [provider]    Physical Exam: Vitals:   02/07/24 1838 02/07/24 1841 02/07/24 1842  BP:  (!) 159/78   Pulse:  87   Resp:  16   Temp:  99.1 F (37.3 C)   TempSrc:  Oral   SpO2: 99% 100%   Weight:   48.5 kg  Height:   5' 6 (1.676 m)   Physical Exam Vitals and nursing note reviewed.  Constitutional:      General: She is  sleeping. She is not in acute distress.    Appearance: She is underweight.  HENT:     Head: Normocephalic and atraumatic.   Cardiovascular:     Rate and Rhythm: Normal rate and regular rhythm.     Heart sounds: Normal heart sounds.  Pulmonary:     Effort: Pulmonary effort is normal.     Breath sounds: Normal breath sounds.  Abdominal:     Palpations: Abdomen is soft.     Tenderness: There is no abdominal tenderness.   Musculoskeletal:     Comments: Deformities of hands related to rheumatoid arthritis   Neurological:     General: No focal deficit present.     Mental Status: She is easily aroused.     Labs on Admission: I have personally reviewed following labs and imaging studies  CBC: Recent Labs  Lab 02/07/24 1845  WBC 4.2  HGB 12.4  HCT 37.7  MCV 92.9  PLT 162   Basic Metabolic Panel: Recent Labs  Lab 02/07/24 1845  NA 141  K 4.1  CL 107  CO2 19*  GLUCOSE 147*  BUN 47*  CREATININE 1.85*  CALCIUM 8.6*   GFR: Estimated Creatinine Clearance: 18 mL/min (A) (by C-G formula based on SCr of 1.85 mg/dL (H)). Liver Function Tests: Recent Labs  Lab 02/07/24 1845  AST 43*  ALT 14  ALKPHOS 62  BILITOT 2.3*  PROT 7.2  ALBUMIN 3.3*   No results for input(s): LIPASE, AMYLASE in the last 168 hours. No results for input(s): AMMONIA in the last 168 hours. Coagulation Profile: No results for input(s): INR, PROTIME in the last 168 hours. Cardiac Enzymes: Recent Labs  Lab 02/07/24 1845  CKTOTAL 1,195*   BNP (last 3 results) No results for input(s): PROBNP in the last 8760 hours. HbA1C: No results for input(s): HGBA1C in the last 72 hours. CBG: No results for input(s): GLUCAP in the last 168 hours. Lipid Profile: No results for input(s): CHOL, HDL, LDLCALC, TRIG, CHOLHDL, LDLDIRECT in the last 72 hours. Thyroid  Function Tests: No results for input(s): TSH, T4TOTAL, FREET4, T3FREE, THYROIDAB in the last 72  hours. Anemia Panel: No results for input(s): VITAMINB12, FOLATE, FERRITIN, TIBC, IRON, RETICCTPCT in the last 72 hours. Urine analysis:    Component Value Date/Time   COLORURINE YELLOW (A) 02/07/2024 2036   APPEARANCEUR TURBID (A) 02/07/2024 2036   APPEARANCEUR Clear 06/17/2013 0851   LABSPEC 1.018 02/07/2024 2036   LABSPEC 1.005 06/17/2013 0851   PHURINE 6.0 02/07/2024 2036   GLUCOSEU NEGATIVE 02/07/2024 2036   GLUCOSEU Negative 06/17/2013 0851   HGBUR NEGATIVE 02/07/2024 2036   BILIRUBINUR NEGATIVE 02/07/2024 2036   BILIRUBINUR Negative 06/17/2013 0851   KETONESUR 5 (A) 02/07/2024 2036   PROTEINUR 100 (A) 02/07/2024 2036   NITRITE NEGATIVE 02/07/2024  2036   LEUKOCYTESUR SMALL (A) 02/07/2024 2036   LEUKOCYTESUR Negative 06/17/2013 0851    Radiological Exams on Admission: DG Knee Complete 4 Views Right Result Date: 02/07/2024 CLINICAL DATA:  Fall, patient found down by neighbor, knee pain EXAM: RIGHT KNEE - COMPLETE 4+ VIEW COMPARISON:  None Available. FINDINGS: Moderate knee effusion.  Tricompartmental spurring. Possible small osteochondral lesion or fragment along the tibial spine. Second linear calcification along the tibial spine is present, cannot exclude avulsion. One of the oblique projections suggests a scalloped appearance of the lateral tibial plateau making it difficult to completely exclude the possibility of a Schatzker III pure depression lateral tibial plateau fracture. Other images are less suggestive. Consider CT of the knee for further characterization. IMPRESSION: 1. Moderate knee effusion. 2. Possible small osteochondral lesion or fragment along the tibial spine. Second linear calcification along the tibial spine is present, cannot exclude avulsion. 3. One of the oblique projections suggests a scalloped appearance of the lateral tibial plateau making it difficult to completely exclude the possibility of a Schatzker III pure depression lateral tibial plateau  fracture. Other images are less suggestive. Consider CT of the knee for further characterization. 4. Tricompartmental spurring. Electronically Signed   By: Ryan Salvage M.D.   On: 02/07/2024 20:10   DG Chest Port 1 View Result Date: 02/07/2024 CLINICAL DATA:  Fall, patient found down.  Right knee pain. EXAM: PORTABLE CHEST 1 VIEW COMPARISON:  04/06/2021 FINDINGS: Advanced degenerative glenohumeral arthropathy bilaterally with patchy sclerosis and severe loss of articular space. Stable mild widening of the left AC joint. Reduced chromium humeral distance on the right probably from a chronic rotator cuff tear on the right. Levoconvex lower thoracic scoliosis. Lower thoracic compression fracture at the T11 level appears increased in magnitude but is otherwise age indeterminate. Stable borderline elevation of the right hemidiaphragm. The lungs appear clear. Heart size within normal limits for projection. No significant blunting of the costophrenic angles. IMPRESSION: 1. Lower thoracic compression fracture at the T11 level appears increased in magnitude compared to 04/06/2021 but is otherwise age indeterminate. 2. Advanced degenerative glenohumeral arthropathy bilaterally. 3. Stable mild widening of the left AC joint. 4. Probable chronic right rotator cuff tear. Electronically Signed   By: Ryan Salvage M.D.   On: 02/07/2024 20:06   CT Head Wo Contrast Result Date: 02/07/2024 CLINICAL DATA:  Head trauma, neck trauma, found by neighbor after fall in home. EXAM: CT HEAD WITHOUT CONTRAST CT CERVICAL SPINE WITHOUT CONTRAST TECHNIQUE: Multidetector CT imaging of the head and cervical spine was performed following the standard protocol without intravenous contrast. Multiplanar CT image reconstructions of the cervical spine were also generated. RADIATION DOSE REDUCTION: This exam was performed according to the departmental dose-optimization program which includes automated exposure control, adjustment of the mA  and/or kV according to patient size and/or use of iterative reconstruction technique. COMPARISON:  None Available. FINDINGS: CT HEAD FINDINGS Brain: No acute intracranial hemorrhage. No CT evidence of acute infarct. No edema, mass effect, or midline shift. The basilar cisterns are patent. Ventricles: Prominence of the ventricles suggesting underlying parenchymal volume loss. Vascular: No hyperdense vessel or unexpected calcification. Skull: No acute or aggressive finding. Orbits: Orbits are symmetric. Sinuses: Mucosal thickening and possible mucous retention cyst in the left sphenoid sinus. Other: Mastoid air cells are clear. CT CERVICAL SPINE FINDINGS Alignment: Straightening of the normal cervical lordosis. No significant listhesis. No facet subluxation or dislocation. Skull base and vertebrae: No acute fracture. No primary bone lesion or focal pathologic process.  There is likely partial fusion of the C4 and C5 vertebral bodies. Soft tissues and spinal canal: No prevertebral fluid or swelling. No visible canal hematoma. Subcentimeter nodule in the left thyroid , no follow-up required. Disc levels: Severe intervertebral disc space narrowing at multiple levels. No high-grade osseous spinal canal stenosis. Facet arthrosis and uncovertebral hypertrophy at multiple levels. Foraminal narrowing most pronounced on the right at C5-6. Upper chest: No acute finding. Other: None. IMPRESSION: No CT evidence of acute intracranial abnormality. No acute fracture or traumatic malalignment of the cervical spine. Chronic and degenerative changes as above. Electronically Signed   By: Donnice Mania M.D.   On: 02/07/2024 19:58   CT Cervical Spine Wo Contrast Result Date: 02/07/2024 CLINICAL DATA:  Head trauma, neck trauma, found by neighbor after fall in home. EXAM: CT HEAD WITHOUT CONTRAST CT CERVICAL SPINE WITHOUT CONTRAST TECHNIQUE: Multidetector CT imaging of the head and cervical spine was performed following the standard  protocol without intravenous contrast. Multiplanar CT image reconstructions of the cervical spine were also generated. RADIATION DOSE REDUCTION: This exam was performed according to the departmental dose-optimization program which includes automated exposure control, adjustment of the mA and/or kV according to patient size and/or use of iterative reconstruction technique. COMPARISON:  None Available. FINDINGS: CT HEAD FINDINGS Brain: No acute intracranial hemorrhage. No CT evidence of acute infarct. No edema, mass effect, or midline shift. The basilar cisterns are patent. Ventricles: Prominence of the ventricles suggesting underlying parenchymal volume loss. Vascular: No hyperdense vessel or unexpected calcification. Skull: No acute or aggressive finding. Orbits: Orbits are symmetric. Sinuses: Mucosal thickening and possible mucous retention cyst in the left sphenoid sinus. Other: Mastoid air cells are clear. CT CERVICAL SPINE FINDINGS Alignment: Straightening of the normal cervical lordosis. No significant listhesis. No facet subluxation or dislocation. Skull base and vertebrae: No acute fracture. No primary bone lesion or focal pathologic process. There is likely partial fusion of the C4 and C5 vertebral bodies. Soft tissues and spinal canal: No prevertebral fluid or swelling. No visible canal hematoma. Subcentimeter nodule in the left thyroid , no follow-up required. Disc levels: Severe intervertebral disc space narrowing at multiple levels. No high-grade osseous spinal canal stenosis. Facet arthrosis and uncovertebral hypertrophy at multiple levels. Foraminal narrowing most pronounced on the right at C5-6. Upper chest: No acute finding. Other: None. IMPRESSION: No CT evidence of acute intracranial abnormality. No acute fracture or traumatic malalignment of the cervical spine. Chronic and degenerative changes as above. Electronically Signed   By: Donnice Mania M.D.   On: 02/07/2024 19:58   Data Reviewed for  HPI: Relevant notes from primary care and specialist visits, past discharge summaries as available in EHR, including Care Everywhere. Prior diagnostic testing as pertinent to current admission diagnoses Updated medications and problem lists for reconciliation ED course, including vitals, labs, imaging, treatment and response to treatment Triage notes, nursing and pharmacy notes and ED provider's notes Notable results as noted above in HPI      Assessment and Plan: * Rhabdomyolysis Fall precautions Hydration Pain control PT eval  AKI (acute kidney injury) (HCC) IV hydration and monitor  Multiple falls Ambulance with walker at baseline Lives alone PT OT eval TOC consult.  House reportedly infested per triage note  Rheumatoid arthritis involving multiple sites [facility]) On chronic immunosuppressive therapy Continue home prednisone.  Also on Renflexis  infusions Continue pain meds  Acquired hypothyroidism Continue levothyroxine     DVT prophylaxis: Lovenox   Consults: none  Advance Care Planning:   Code Status: Full  Code   Family Communication: none  Disposition Plan: Back to previous home environment  Severity of Illness: The appropriate patient status for this patient is OBSERVATION. Observation status is judged to be reasonable and necessary in order to provide the required intensity of service to ensure the patient's safety. The patient's presenting symptoms, physical exam findings, and initial radiographic and laboratory data in the context of their medical condition is felt to place them at decreased risk for further clinical deterioration. Furthermore, it is anticipated that the patient will be medically stable for discharge from the hospital within 2 midnights of admission.   Author: Delayne LULLA Solian, MD 02/07/2024 9:02 PM  For on call review [email or web address].

## 2024-02-07 NOTE — ED Notes (Signed)
 Patient changed out of personal clothes and placed in a patient belongings bag. Patient placed in a hospital gown, paper pad, and brief. A purewick was placed on the patient. Patient currently wearing a fall risk armband with bed alarm activated. Bed in lowest position.

## 2024-02-07 NOTE — ED Provider Notes (Signed)
 [facility] Provider Note    Event Date/Time   First MD Initiated Contact with Patient 02/07/24 Quin Brush     (approximate)   History     HPI  [name] is a 83 y.o. female past medical history significant for bilateral hip replacements, hypertension, anemia, rheumatoid arthritis, who presents to the emergency department following a fall and being found down.  Patient states that she does not recall the events of the fall.  Her neighbor found her lying on the ground and believes that she was on the ground for at least 24 hours.  Patient does states maybe it just went crazy.  She is alert and oriented x 3 but unable to state why she is here.  Does not recall the fall in the last thing she remembers is laying in her bed warm but does not know what day this was.  Denies chest pain, shortness of breath, headache, change in vision, slurring of speech, extremity numbness or weakness.  Denies any dysuria, urinary urgency or frequency.  States that she lives at home by herself.  States that she has a life alert but she does not know why she did not pull it last night or whenever she had the fall.  Distant cousin is coming up to the emergency department.     Physical Exam   Triage Vital Signs: ED Triage Vitals [02/07/24 1838]  Encounter Vitals Group     BP      Girls Systolic BP Percentile      Girls Diastolic BP Percentile      Boys Systolic BP Percentile      Boys Diastolic BP Percentile      Pulse      Resp      Temp      Temp src      SpO2 99 %     Weight      Height      Head Circumference      Peak Flow      Pain Score      Pain Loc      Pain Education      Exclude from Growth Chart     Most recent vital signs: Vitals:   02/07/24 1838 02/07/24 1841  BP:  (!) 159/78  Pulse:  87  Resp:  16  Temp:  99.1 F (37.3 C)  SpO2: 99% 100%    Physical Exam Constitutional:      Appearance: She is well-developed.  HENT:     Head: Atraumatic.    Eyes:     Conjunctiva/sclera: Conjunctivae normal.    Cardiovascular:     Rate and Rhythm: Regular rhythm.  Pulmonary:     Effort: No respiratory distress.  Abdominal:     General: There is no distension.   Musculoskeletal:        General: Normal range of motion.     Cervical back: Normal range of motion.     Comments: Pressure wound to the right buttocks, appears to be stage I.  No purulence, drainage or surrounding erythema or warmth.  Bilateral feet with cold toes, no obvious wounds.  No pain with range of motion to bilateral hips.  Mild tenderness palpation to the right knee.  No midline cervical, thoracic or lumbar tenderness to palpation.  No tenderness to bilateral upper or lower extremities other than the right knee.   Skin:    General: Skin is warm.     Findings: Rash (  Diffuse rash to the upper and lower extremities and across the abdomen, appears to have excoriation.  States that this is a chronic rash.) present.   Neurological:     Mental Status: She is alert. Mental status is at baseline.     IMPRESSION / MDM / ASSESSMENT AND PLAN / ED COURSE  I reviewed the triage vital signs and the nursing notes.  Differential diagnosis including infectious process, rhabdomyolysis, dehydration, delirium, ACS, dysrhythmia, intracranial hemorrhage  EKG  I, Viviano Ground, the attending physician, personally viewed and interpreted this ECG.  EKG showed normal sinus rhythm.  Does appear to have a PAC.  T waves inverted to the inferior leads was compared to previous appears to be new.  No significant ST elevation.    No tachycardic or bradycardic dysrhythmias while on cardiac telemetry.  RADIOLOGY I independently reviewed imaging, my interpretation of imaging: CT scan of the head -enlarged ventricles but no obvious intracranial hemorrhage CT scan cervical spine -no obvious cervical spine fracture  Chest x-ray -no signs of pneumonia or pneumothorax  X-ray right knee -no obvious  fracture  Formal reads pending  LABS (all labs ordered are listed, but only abnormal results are displayed) Labs interpreted as -    Labs Reviewed  COMPREHENSIVE METABOLIC PANEL WITH GFR - Abnormal; Notable for the following components:      Result Value   CO2 19 (*)    Glucose, Bld 147 (*)    BUN 47 (*)    Creatinine, Ser 1.85 (*)    Calcium 8.6 (*)    Albumin 3.3 (*)    AST 43 (*)    Total Bilirubin 2.3 (*)    GFR, Estimated 27 (*)    All other components within normal limits  CK - Abnormal; Notable for the following components:   Total CK 1,195 (*)    All other components within normal limits  CBC  URINALYSIS, W/ REFLEX TO CULTURE (INFECTION SUSPECTED)  TROPONIN I (HIGH SENSITIVITY)     MDM    Patient found to have findings concerning for rhabdomyolysis.  CK significantly elevated in the 1000's and does appear to have an acute kidney injury with creatinine elevated at 1.85 from a baseline of 1-1.5 does have an elevated BUN of 47.  CO2 is mildly low at 19.  T. bili is elevated at 2.3, no right upper quadrant abdominal tenderness to palpation, have low suspicion for symptomatic cholelithiasis or acute cholecystitis.  Troponin negative, doubt ACS. UA pending.   Treated with 2 L of IV fluids given findings concerning for rhabdomyolysis with acute kidney injury.  Will consult hospitalist for admission for rhabdomyolysis.   PROCEDURES:  Critical Care performed: No  Procedures  Patient's presentation is most consistent with acute presentation with potential threat to life or bodily function.   MEDICATIONS ORDERED IN ED: Medications  sodium chloride  0.9 % bolus 2,000 mL (has no administration in time range)    FINAL CLINICAL IMPRESSION(S) / ED DIAGNOSES   Final diagnoses:  Fall, initial encounter  Traumatic rhabdomyolysis, initial encounter (HCC)  AKI (acute kidney injury) (HCC)     Rx / DC Orders   ED Discharge Orders     None        Note:  This  document was prepared using Dragon voice recognition software and may include unintentional dictation errors.   Viviano Ground, MD 02/07/24 [phone]

## 2024-02-07 NOTE — Assessment & Plan Note (Signed)
 Continue levothyroxine 

## 2024-02-07 NOTE — ED Triage Notes (Signed)
 Patient to ED via [facility] from home. Patient was found by her neighbor today after patient had a fall in her home. Patient was on floor for at least 24 hours. Patient states I think I just went crazy. Patient complaining of R knee pain. Patient A&Ox3 at this time. EMS states patient's home was unsanitary and is infested with bugs. Family mentions wanting patient to have a social work consult. EMS states patient was a-fib on the monitor in route. Unsure if this is patient's baseline.

## 2024-02-07 NOTE — Assessment & Plan Note (Addendum)
 Lives by herself.  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  TOC to speak with DSS on Monday to try to set up plan.

## 2024-02-07 NOTE — Assessment & Plan Note (Addendum)
 Creatinine improved from 1.85 down to 0.8

## 2024-02-07 NOTE — Assessment & Plan Note (Addendum)
 On chronic immunosuppressive therapy Patient takes methotrexate  and folic acid .  Patient placed on prednisone  5 mg daily.

## 2024-02-07 NOTE — Assessment & Plan Note (Addendum)
 CK peaked at 1195.  This problem has resolved.

## 2024-02-08 DIAGNOSIS — N179 Acute kidney failure, unspecified: Secondary | ICD-10-CM

## 2024-02-08 DIAGNOSIS — T796XXD Traumatic ischemia of muscle, subsequent encounter: Secondary | ICD-10-CM | POA: Diagnosis not present

## 2024-02-08 DIAGNOSIS — E039 Hypothyroidism, unspecified: Secondary | ICD-10-CM | POA: Diagnosis present

## 2024-02-08 DIAGNOSIS — E871 Hypo-osmolality and hyponatremia: Secondary | ICD-10-CM | POA: Diagnosis present

## 2024-02-08 DIAGNOSIS — I129 Hypertensive chronic kidney disease with stage 1 through stage 4 chronic kidney disease, or unspecified chronic kidney disease: Secondary | ICD-10-CM | POA: Diagnosis present

## 2024-02-08 DIAGNOSIS — Z8261 Family history of arthritis: Secondary | ICD-10-CM | POA: Diagnosis not present

## 2024-02-08 DIAGNOSIS — N1831 Chronic kidney disease, stage 3a: Secondary | ICD-10-CM | POA: Diagnosis present

## 2024-02-08 DIAGNOSIS — U071 COVID-19: Secondary | ICD-10-CM | POA: Diagnosis not present

## 2024-02-08 DIAGNOSIS — Z681 Body mass index (BMI) 19 or less, adult: Secondary | ICD-10-CM | POA: Diagnosis not present

## 2024-02-08 DIAGNOSIS — L89313 Pressure ulcer of right buttock, stage 3: Secondary | ICD-10-CM | POA: Diagnosis present

## 2024-02-08 DIAGNOSIS — M6282 Rhabdomyolysis: Secondary | ICD-10-CM | POA: Diagnosis present

## 2024-02-08 DIAGNOSIS — Z7983 Long term (current) use of bisphosphonates: Secondary | ICD-10-CM | POA: Diagnosis not present

## 2024-02-08 DIAGNOSIS — T796XXA Traumatic ischemia of muscle, initial encounter: Secondary | ICD-10-CM | POA: Diagnosis present

## 2024-02-08 DIAGNOSIS — N3000 Acute cystitis without hematuria: Secondary | ICD-10-CM | POA: Diagnosis present

## 2024-02-08 DIAGNOSIS — R296 Repeated falls: Secondary | ICD-10-CM

## 2024-02-08 DIAGNOSIS — F4329 Adjustment disorder with other symptoms: Secondary | ICD-10-CM | POA: Diagnosis not present

## 2024-02-08 DIAGNOSIS — Z800 Family history of malignant neoplasm of digestive organs: Secondary | ICD-10-CM | POA: Diagnosis not present

## 2024-02-08 DIAGNOSIS — W1839XA Other fall on same level, initial encounter: Secondary | ICD-10-CM | POA: Diagnosis present

## 2024-02-08 DIAGNOSIS — Z7952 Long term (current) use of systemic steroids: Secondary | ICD-10-CM | POA: Diagnosis not present

## 2024-02-08 DIAGNOSIS — E43 Unspecified severe protein-calorie malnutrition: Secondary | ICD-10-CM | POA: Diagnosis present

## 2024-02-08 DIAGNOSIS — Z7982 Long term (current) use of aspirin: Secondary | ICD-10-CM | POA: Diagnosis not present

## 2024-02-08 DIAGNOSIS — D84821 Immunodeficiency due to drugs: Secondary | ICD-10-CM | POA: Diagnosis present

## 2024-02-08 DIAGNOSIS — L89303 Pressure ulcer of unspecified buttock, stage 3: Secondary | ICD-10-CM

## 2024-02-08 DIAGNOSIS — Z79890 Hormone replacement therapy (postmenopausal): Secondary | ICD-10-CM | POA: Diagnosis not present

## 2024-02-08 DIAGNOSIS — Z96643 Presence of artificial hip joint, bilateral: Secondary | ICD-10-CM | POA: Diagnosis present

## 2024-02-08 DIAGNOSIS — Z9225 Personal history of immunosupression therapy: Secondary | ICD-10-CM | POA: Diagnosis not present

## 2024-02-08 DIAGNOSIS — Z046 Encounter for general psychiatric examination, requested by authority: Secondary | ICD-10-CM | POA: Diagnosis not present

## 2024-02-08 DIAGNOSIS — R413 Other amnesia: Secondary | ICD-10-CM

## 2024-02-08 DIAGNOSIS — L89320 Pressure ulcer of left buttock, unstageable: Secondary | ICD-10-CM | POA: Diagnosis present

## 2024-02-08 DIAGNOSIS — Z23 Encounter for immunization: Secondary | ICD-10-CM | POA: Diagnosis present

## 2024-02-08 DIAGNOSIS — M069 Rheumatoid arthritis, unspecified: Secondary | ICD-10-CM | POA: Diagnosis present

## 2024-02-08 DIAGNOSIS — L8990 Pressure ulcer of unspecified site, unspecified stage: Secondary | ICD-10-CM | POA: Insufficient documentation

## 2024-02-08 DIAGNOSIS — L89899 Pressure ulcer of other site, unspecified stage: Secondary | ICD-10-CM | POA: Diagnosis not present

## 2024-02-08 DIAGNOSIS — T796XXS Traumatic ischemia of muscle, sequela: Secondary | ICD-10-CM | POA: Diagnosis not present

## 2024-02-08 DIAGNOSIS — Y92008 Other place in unspecified non-institutional (private) residence as the place of occurrence of the external cause: Secondary | ICD-10-CM | POA: Diagnosis not present

## 2024-02-08 DIAGNOSIS — Z8249 Family history of ischemic heart disease and other diseases of the circulatory system: Secondary | ICD-10-CM | POA: Diagnosis not present

## 2024-02-08 DIAGNOSIS — B961 Klebsiella pneumoniae [K. pneumoniae] as the cause of diseases classified elsewhere: Secondary | ICD-10-CM | POA: Diagnosis present

## 2024-02-08 LAB — COMPREHENSIVE METABOLIC PANEL WITH GFR
ALT: 12 U/L (ref 0–44)
AST: 31 U/L (ref 15–41)
Albumin: 2.7 g/dL — ABNORMAL LOW (ref 3.5–5.0)
Alkaline Phosphatase: 48 U/L (ref 38–126)
Anion gap: 8 (ref 5–15)
BUN: 38 mg/dL — ABNORMAL HIGH (ref 8–23)
CO2: 22 mmol/L (ref 22–32)
Calcium: 7.3 mg/dL — ABNORMAL LOW (ref 8.9–10.3)
Chloride: 107 mmol/L (ref 98–111)
Creatinine, Ser: 1.36 mg/dL — ABNORMAL HIGH (ref 0.44–1.00)
GFR, Estimated: 39 mL/min — ABNORMAL LOW (ref >60)
Glucose, Bld: 84 mg/dL (ref 70–99)
Potassium: 3.6 mmol/L (ref 3.5–5.1)
Sodium: 137 mmol/L (ref 135–145)
Total Bilirubin: 1.4 mg/dL — ABNORMAL HIGH (ref 0.0–1.2)
Total Protein: 5.8 g/dL — ABNORMAL LOW (ref 6.5–8.1)

## 2024-02-08 LAB — CBC
HCT: 33.6 % — ABNORMAL LOW (ref 36.0–46.0)
Hemoglobin: 10.7 g/dL — ABNORMAL LOW (ref 12.0–15.0)
MCH: 29.4 pg (ref 26.0–34.0)
MCHC: 31.8 g/dL (ref 30.0–36.0)
MCV: 92.3 fL (ref 80.0–100.0)
Platelets: 146 10*3/uL — ABNORMAL LOW (ref 150–400)
RBC: 3.64 MIL/uL — ABNORMAL LOW (ref 3.87–5.11)
RDW: 14.5 % (ref 11.5–15.5)
WBC: 4.3 10*3/uL (ref 4.0–10.5)
nRBC: 0 % (ref 0.0–0.2)

## 2024-02-08 LAB — CK: Total CK: 861 U/L — ABNORMAL HIGH (ref 38–234)

## 2024-02-08 MED ORDER — FERROUS SULFATE 325 (65 FE) MG PO TABS
325.0000 mg | ORAL_TABLET | Freq: Every day | ORAL | Status: DC
  Administered 2024-02-08 – 2024-03-11 (×33): 325 mg via ORAL
  Filled 2024-02-08 (×33): qty 1

## 2024-02-08 MED ORDER — LEVOTHYROXINE SODIUM 50 MCG PO TABS
75.0000 ug | ORAL_TABLET | Freq: Every day | ORAL | Status: DC
  Administered 2024-02-09: 75 ug via ORAL
  Filled 2024-02-08: qty 1

## 2024-02-08 MED ORDER — ASPIRIN 81 MG PO TBEC
81.0000 mg | DELAYED_RELEASE_TABLET | Freq: Every day | ORAL | Status: DC
  Administered 2024-02-08 – 2024-03-11 (×33): 81 mg via ORAL
  Filled 2024-02-08 (×33): qty 1

## 2024-02-08 MED ORDER — SODIUM CHLORIDE 0.9 % IV SOLN: INTRAVENOUS | Status: AC

## 2024-02-08 MED ORDER — FOLIC ACID 1 MG PO TABS
1.0000 mg | ORAL_TABLET | Freq: Every day | ORAL | Status: DC
  Administered 2024-02-08 – 2024-03-11 (×33): 1 mg via ORAL
  Filled 2024-02-08 (×33): qty 1

## 2024-02-08 MED ORDER — SODIUM CHLORIDE 0.9 % IV SOLN: INTRAVENOUS | Status: DC

## 2024-02-08 MED ORDER — VITAMIN B-12 1000 MCG PO TABS
1000.0000 ug | ORAL_TABLET | Freq: Every day | ORAL | Status: DC
  Administered 2024-02-08 – 2024-03-11 (×33): 1000 ug via ORAL
  Filled 2024-02-08 (×33): qty 1

## 2024-02-08 NOTE — Progress Notes (Addendum)
 Mobility Specialist - Progress Note     02/08/24 1600  Mobility  Activity Ambulated with assistance in hallway;Stood at bedside  Level of Assistance Standby assist, set-up cues, supervision of patient - no hands on  Assistive Device Front wheel walker  Distance Ambulated (ft) 200 ft  Range of Motion/Exercises Active  Activity Response Tolerated well  Mobility Referral Yes  Mobility visit 1 Mobility  Mobility Specialist Start Time (ACUTE ONLY) 1459  Mobility Specialist Stop Time (ACUTE ONLY) 1522  Mobility Specialist Time Calculation (min) (ACUTE ONLY) 23 min   Pt resting in recliner on RA upon entry. Pt STS and ambulates to hallway around NS SBA with RW. Pt gait is moderately steady; slow. Pt returned to recliner and left with needs in reach and chair alarm activated.   Guido Rumble Mobility Specialist 02/08/24, 4:31 PM

## 2024-02-08 NOTE — Assessment & Plan Note (Addendum)
 Present on admission.  Wounds restaged by wound care nurse.  They have progressed..  1.  Right buttock/ischium stage III.  2.  Left buttock/ischium now unstageable.  Cleanse bilateral buttock wounds with Vashe wound cleanser [name] [phone]), allowed to air dry.  Apply one quarter thick layer of Santyl  to wound beds daily using a Q-tip applicator to fill on the depth of the left buttock wound with normal saline gauze, top right buttock wound with normal saline gauze.  Top dry gauze and secure with silicone foam or ABD pad.
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
## 2024-02-08 NOTE — Evaluation (Signed)
 Physical Therapy Evaluation Patient Details Name: [name] MRN: [id number] DOB: [birth year] Today's Date: 02/08/2024  History of Present Illness  [name] is a 83 y.o. female with medical history significant for Hypothyroidism, rheumatoid arthritis on chronic prednisone and Renflexis  infusions, followed by rheumatology, osteoarthritis, PCP documentation of several falls requiring ED visits, and who currently uses a walker, who is being admitted for rhabdomyolysis after she was found down by a neighbor.  She was on the floor for over 24 hours.   Clinical Impression  Patient received reclining in bed and agreeable to PT evaluation. Patient was alert but disoriented to situation and location. She was oriented to herself and knew the correct year and month given enough time to think about it. Per patient, prior to hospitalization she was mod I with mobility using RW and was I with ADLs except she was afraid to get in the tub/shower so she only takes sponge baths and she mostly goes out to eat fast food or prepare frozen dinners at home. She does drive short distances and grocery shops. She reports 7 falls in the last 6 months where she uses her life alert button to call EMS to pick her up and take her to the ED. Upon PT evaluation, patient required min A to go supine to sit and stand up from hospital bed to RW. Patient ambulated around nurses station with RW and CGA-SBA. She started out with a very short step to gait and gradually improved to step through gait with limited step length. She remained stooped throughout ambulation and sometimes allowed her feet outside of the walker base. Patient's skin is covered in scabs and is red almost everywhere where visualized and she was unaware that her brief was soaked. Due to patient's cognitive deficits, need for assistance with basic mobility, and history of many falls, she will require constant supervision or assistance to be able to return home safely.  Patient would benefit from continued management of limiting condition by skilled physical therapist to address remaining impairments and functional limitations to work towards stated goals and return to PLOF or maximal functional independence.        If plan is discharge home, recommend the following: Assistance with cooking/housework;A little help with bathing/dressing/bathroom;A little help with walking and/or transfers;Supervision due to cognitive status;Assist for transportation;Help with stairs or ramp for entrance   Can travel by private vehicle   Yes    Equipment Recommendations BSC/3in1  Recommendations for Other Services  OT consult    Functional Status Assessment Patient has had a recent decline in their functional status and demonstrates the ability to make significant improvements in function in a reasonable and predictable amount of time.     Precautions / Restrictions Precautions Precautions: Fall Restrictions Weight Bearing Restrictions Per Provider Order: No      Mobility  Bed Mobility Overal bed mobility: Needs Assistance Bed Mobility: Supine to Sit     Supine to sit: Min assist     General bed mobility comments: unable to get left foot out of blankets and ran out of problem solving options that she could think of to get her foot free. Then she was too weak to push herself up to sit from flat surface (what she states she has at home).    Transfers Overall transfer level: Needs assistance Equipment used: Rolling walker (2 wheels) Transfers: Sit to/from Stand Sit to Stand: Min assist           General transfer comment:  Patient transfered sit <> stand from bed to chair. She made many unsuccessful attempts to stand from the bed and needed cuing for hand placemnt. She then barely got up using the back of her legs on the bed to help her stand. Does not perform sit <> stand reliably from bed without min A.    Ambulation/Gait Ambulation/Gait assistance:  Supervision, Contact guard assist Gait Distance (Feet): 230 Feet Assistive device: Rolling walker (2 wheels) Gait Pattern/deviations: Step-to pattern, Step-through pattern, Decreased step length - right, Decreased step length - left, Trunk flexed, Shuffle Gait velocity: very slow     General Gait Details: Patient ambulated around nurses station with RW and CGA-SBA. She started out with a very short step to gait and gradually improved to step through gait with limited step length. She remained stooped throughout ambulation and sometimes allowed her feet outside of the walker base. She verbalized it felt good to walk and she likes to go to [employer] to walk.  Stairs            Wheelchair Mobility     Tilt Bed    Modified Rankin (Stroke Patients Only)       Balance Overall balance assessment: Needs assistance Sitting-balance support: Bilateral upper extremity supported Sitting balance-Leahy Scale: Good     Standing balance support: Reliant on assistive device for balance, Bilateral upper extremity supported Standing balance-Leahy Scale: Poor Standing balance comment: Patient reliant on RW for balance withmobiltiy.                             Pertinent Vitals/Pain Pain Assessment Pain Assessment: No/denies pain    Home Living Family/patient expects to be discharged to:: Private residence Living Arrangements: Alone Available Help at Discharge: Available PRN/intermittently (neice lives 3 houses away, unclear how much help she can provide) Type of Home: House Home Access: Ramped entrance       Home Layout: One level Home Equipment: Agricultural consultant (2 wheels);Cane - single point Additional Comments: she has a life alert    Prior Function Prior Level of Function : Needs assist;History of Falls (last six months);Driving;Patient poor historian/Family not available             Mobility Comments: Patient reports she has fallen 7 times in the last 6 months  (calls EMS to pick her up and take her to ED), she states she uses her RW for mobility now. She goes to [employer] to walk sometimes and she likes to go outside. ADLs Comments: Reports I with ADLs and preparing frozen meals. Needs a shoe horn to don her shoes. Does not shower because she is afraid to get into the tub/shower so she takes sponge baths. Drives short distances and recently got a newer car.     Extremity/Trunk Assessment   Upper Extremity Assessment Upper Extremity Assessment: Generalized weakness (B hand deformities consistent with RA)    Lower Extremity Assessment Lower Extremity Assessment: Generalized weakness (B foot deformities consistent with RA)    Cervical / Trunk Assessment Cervical / Trunk Assessment: Kyphotic  Communication   Communication Communication: No apparent difficulties    Cognition Arousal: Alert Behavior During Therapy: WFL for tasks assessed/performed   PT - Cognitive impairments: No family/caregiver present to determine baseline, Orientation, Problem solving, Attention, Awareness   Orientation impairments: Place, Time, Situation                   PT - Cognition Comments: oriented to  self; oriented to month and year when given enough time to think about it. Not oriented to situiation or place. Following commands: Intact       Cueing Cueing Techniques: Verbal cues     General Comments General comments (skin integrity, edema, etc.): patient has scabs and redness over her entire body and frequently itches. Her finger nails are too long and dirty. Her breif was soaked and she was unaware.    Exercises Other Exercises Other Exercises: pateint practiced safe mobility and functional strengthening. Received education about safe body/AD positioning and mobilitly skills.   Assessment/Plan    PT Assessment Patient needs continued PT services  PT Problem List Decreased strength;Decreased mobility;Decreased safety awareness;Decreased activity  tolerance;Decreased cognition;Decreased balance;Decreased knowledge of use of DME;Decreased skin integrity;Decreased knowledge of precautions       PT Treatment Interventions DME instruction;Therapeutic exercise;Gait training;Balance training;Neuromuscular re-education;Functional mobility training;Cognitive remediation;Therapeutic activities;Patient/family education    PT Goals (Current goals can be found in the Care Plan section)  Acute Rehab PT Goals Patient Stated Goal: walk more PT Goal Formulation: With patient Time For Goal Achievement: 02/22/24 Potential to Achieve Goals: Good    Frequency Min 2X/week     Co-evaluation               AM-PAC PT 6 Clicks Mobility  Outcome Measure Help needed turning from your back to your side while in a flat bed without using bedrails?: A Little Help needed moving from lying on your back to sitting on the side of a flat bed without using bedrails?: A Little Help needed moving to and from a bed to a chair (including a wheelchair)?: A Little Help needed standing up from a chair using your arms (e.g., wheelchair or bedside chair)?: A Little Help needed to walk in hospital room?: A Little Help needed climbing 3-5 steps with a railing? : A Lot 6 Click Score: 17    End of Session Equipment Utilized During Treatment: Gait belt Activity Tolerance: Patient tolerated treatment well Patient left: in chair;with call bell/phone within reach;with chair alarm set Nurse Communication: Mobility status PT Visit Diagnosis: Other abnormalities of gait and mobility (R26.89);Muscle weakness (generalized) (M62.81);Repeated falls (R29.6);History of falling (Z91.81);Unsteadiness on feet (R26.81)    Time: 8996-8966 PT Time Calculation (min) (ACUTE ONLY): 30 min   Charges:   PT Evaluation $PT Eval Low Complexity: 1 Low PT Treatments $Therapeutic Activity: 8-22 mins PT General Charges $$ ACUTE PT VISIT: 1 Visit         Camie SAUNDERS. Juli, PT, DPT,  Cert. MDT 02/08/24, 11:00 AM

## 2024-02-08 NOTE — Assessment & Plan Note (Addendum)
 Definitely concerned about her memory and living by herself.  Seen by psychiatry and deemed capable of making medical decisions currently.  TOC to speak with DSS on Monday to try to set up a plan.

## 2024-02-08 NOTE — Evaluation (Signed)
 Occupational Therapy Evaluation Patient Details Name: [name] MRN: [id number] DOB: 1940/09/24 Today's Date: 02/08/2024   History of Present Illness   [name] is a 83 y.o. female with medical history significant for Hypothyroidism, rheumatoid arthritis on chronic prednisone and Renflexis  infusions, followed by rheumatology, osteoarthritis, PCP documentation of several falls requiring ED visits, and who currently uses a walker, who is being admitted for rhabdomyolysis after she was found down by a neighbor.  She was on the floor for over 24 hours.     Clinical Impressions Pt was seen for OT evaluation this date. Prior to hospital admission, pt reports she was MODI/indep, using AD for dressing tasks, driving herself, often taking sink baths due to recent falls. Pt lives with alone in house with a ramped entrance. Pt presents to acute OT demonstrating impaired ADL performance and functional mobility 2/2 (See OT problem list for additional functional deficits). Pt currently requires CGA to STS from recliner and standard toilet seat with verbal cues for hand placement.Pt amb into BR with RW + CGA throughout, noted often bumping into things with RW with delayed reaction time to resolve. Pt completed sink level ADL tasks with CGA for safety. Pt often mentioned  and the need to find her purse/teeth - RN aware of possible missing items. Pt was alert and oriented x3, confused about situation. Pt returned to recliner with all needs in reach, awaiting on lunch to arrive. Pt would benefit from skilled OT services to address noted impairments and functional limitations (see below for any additional details) in order to maximize safety and independence while minimizing falls risk and caregiver burden. OT will follow acutely.     If plan is discharge home, recommend the following:   A little help with walking and/or transfers;A little help with bathing/dressing/bathroom;Direct supervision/assist for  financial management;Direct supervision/assist for medications management;Assist for transportation     Functional Status Assessment   Patient has had a recent decline in their functional status and demonstrates the ability to make significant improvements in function in a reasonable and predictable amount of time.     Equipment Recommendations   Other (comment) (Defer to next venue of care)     Recommendations for Other Services         Precautions/Restrictions   Precautions Precautions: Fall Recall of Precautions/Restrictions: Impaired Restrictions Weight Bearing Restrictions Per Provider Order: No     Mobility Bed Mobility               General bed mobility comments: NT in recliner pre/post session    Transfers Overall transfer level: Needs assistance Equipment used: Rolling walker (2 wheels) Transfers: Sit to/from Stand Sit to Stand: Contact guard assist           General transfer comment: No physical assistance to come to standing from recliner fro standard toilet height      Balance Overall balance assessment: Needs assistance Sitting-balance support: Bilateral upper extremity supported Sitting balance-Leahy Scale: Good Sitting balance - Comments: Steady reaching within BOS   Standing balance support: Reliant on assistive device for balance, Bilateral upper extremity supported Standing balance-Leahy Scale: Poor Standing balance comment: Heavy reliant on RW during mobility                           ADL either performed or assessed with clinical judgement   ADL Overall ADL's : Needs assistance/impaired Eating/Feeding: Sitting;Set up   Grooming: Wash/dry hands;Standing Grooming Details (indicate cue  type and reason): Sink level ADLs                 Toilet Transfer: Contact guard assist;Ambulation;Regular Toilet;Rolling walker (2 wheels);Cueing for safety   Toileting- Clothing Manipulation and Hygiene: Contact guard  assist;Sit to/from stand       Functional mobility during ADLs: Contact guard assist;Rolling walker (2 wheels) General ADL Comments: CGA for standing ADL tasks, set up/supervision for seated tasks     Vision         Perception         Praxis         Pertinent Vitals/Pain Pain Assessment Pain Assessment: No/denies pain     Extremity/Trunk Assessment Upper Extremity Assessment Upper Extremity Assessment: Generalized weakness;RUE deficits/detail;LUE deficits/detail RUE Deficits / Details: R hand deformities, R>L LUE Deficits / Details: L hand deformities   Lower Extremity Assessment Lower Extremity Assessment: Defer to PT evaluation;Generalized weakness   Cervical / Trunk Assessment Cervical / Trunk Assessment: Kyphotic   Communication Communication Communication: No apparent difficulties   Cognition Arousal: Alert Behavior During Therapy: WFL for tasks assessed/performed Cognition: No family/caregiver present to determine baseline             OT - Cognition Comments: A/Ox3, confused about situation                 Following commands: Intact       Cueing  General Comments   Cueing Techniques: Verbal cues  Pt endores her nails are too long for her to cut and that she is unable to attend to them due to her RA in bilateral hands.   Exercises Exercises: Other exercises Other Exercises Other Exercises: Edu: Role of OT eval, DME management during ADL tasks, fall prevention techniques   Shoulder Instructions      Home Living Family/patient expects to be discharged to:: Private residence Living Arrangements: Alone Available Help at Discharge: Available PRN/intermittently Type of Home: House Home Access: Ramped entrance     Home Layout: One level     Bathroom Shower/Tub: Chief Strategy Officer: Standard Bathroom Accessibility: Yes How Accessible: Accessible via walker Home Equipment: Rolling Walker (2 wheels);Cane - single  point   Additional Comments: she has a life alert      Prior Functioning/Environment Prior Level of Function : History of Falls (last six months);Driving;Patient poor historian/Family not available;Independent/Modified Independent             Mobility Comments: Patient reports she has fallen 7 times in the last 6 months (calls EMS to pick her up and take her to ED), she states she uses her RW for mobility now. She goes to [employer] to walk sometimes and she likes to go outside. ADLs Comments: Reports I with ADLs and preparing frozen meals. Needs a shoe horn to don her shoes. Does not shower because she is afraid to get into the tub/shower so she takes sponge baths. Drives short distances and recently got a newer car.    OT Problem List: Decreased strength;Decreased range of motion;Decreased activity tolerance;Impaired balance (sitting and/or standing);Decreased coordination;Decreased cognition;Decreased safety awareness;Decreased knowledge of use of DME or AE;Decreased knowledge of precautions   OT Treatment/Interventions: Self-care/ADL training;Therapeutic exercise;DME and/or AE instruction;Modalities;Therapeutic activities;Cognitive remediation/compensation;Patient/family education;Balance training      OT Goals(Current goals can be found in the care plan section)   Acute Rehab OT Goals Patient Stated Goal: Have less falls OT Goal Formulation: With patient Time For Goal Achievement: 02/22/24 Potential to Achieve Goals: Good  ADL Goals Pt Will Perform Grooming: with modified independence;standing Pt Will Perform Lower Body Dressing: with modified independence;sitting/lateral leans Pt Will Transfer to Toilet: with modified independence;ambulating Pt Will Perform Toileting - Clothing Manipulation and hygiene: with modified independence;sit to/from stand   OT Frequency:  Min 2X/week    Co-evaluation              AM-PAC OT 6 Clicks Daily Activity     Outcome Measure Help  from another person eating meals?: A Little Help from another person taking care of personal grooming?: A Lot Help from another person toileting, which includes using toliet, bedpan, or urinal?: A Little Help from another person bathing (including washing, rinsing, drying)?: A Little Help from another person to put on and taking off regular upper body clothing?: A Little Help from another person to put on and taking off regular lower body clothing?: A Little 6 Click Score: 17   End of Session Equipment Utilized During Treatment: Gait belt;Rolling walker (2 wheels) Nurse Communication: Other (comment) (Pt concerns about her lack of teeth and personal belongings)  Activity Tolerance: Patient tolerated treatment well Patient left: in chair;with call bell/phone within reach;with chair alarm set  OT Visit Diagnosis: Unsteadiness on feet (R26.81);Other abnormalities of gait and mobility (R26.89);Repeated falls (R29.6);Muscle weakness (generalized) (M62.81)                Time: 8790-8773 OT Time Calculation (min): 17 min Charges:  OT General Charges $OT Visit: 1 Visit OT Evaluation $OT Eval Low Complexity: 1 Low OT Treatments $Self Care/Home Management : 8-22 mins  Larraine Colas M.S. OTR/L  02/08/24, 2:35 PM

## 2024-02-08 NOTE — ED Notes (Signed)
Pt being transported to rm 114 at this time.

## 2024-02-08 NOTE — Plan of Care (Signed)
  Problem: Education: Goal: Knowledge of General Education information will improve Description: Including pain rating scale, medication(s)/side effects and non-pharmacologic comfort measures Outcome: Not Progressing   Problem: Health Behavior/Discharge Planning: Goal: Ability to manage health-related needs will improve Outcome: Not Progressing   Problem: Clinical Measurements: Goal: Ability to maintain clinical measurements within normal limits will improve Outcome: Progressing Goal: Will remain free from infection Outcome: Progressing Goal: Diagnostic test results will improve Outcome: Progressing Goal: Respiratory complications will improve Outcome: Progressing Goal: Cardiovascular complication will be avoided Outcome: Progressing   Problem: Activity: Goal: Risk for activity intolerance will decrease Outcome: Not Progressing   Problem: Nutrition: Goal: Adequate nutrition will be maintained Outcome: Not Progressing   Problem: Coping: Goal: Level of anxiety will decrease Outcome: Progressing

## 2024-02-08 NOTE — Progress Notes (Addendum)
 SLP Cancellation Note  Patient Details Name: SHAYANNA THATCH MRN: [id number] DOB: [birth year]   Cancelled treatment:       Reason Eval/Treat Not Completed: SLP screened, no needs identified, will sign off  Per chart review pt does not have a hx of neurologic concerns. Head CT negative. Rn reporting confusion persists, though pt able to follow simple commands. Suspect that current confusion is related to overall medical picture/side effect of time down following fall- with hope for improved mentation with medical intervention and time. Therefore formal cognitive evaluation is not indicated. Pt to benefit from outpatient neuro follow up if family/patient has further concerns. MD and RN aware of recommendation.   [place] Kynley Metzger Clapp, MS, CCC-SLP Speech Language Pathologist Rehab Services; [facility] [phone] (ascom)   [place] J Clapp 02/08/2024, 10:00 AM

## 2024-02-09 DIAGNOSIS — T796XXS Traumatic ischemia of muscle, sequela: Secondary | ICD-10-CM | POA: Diagnosis not present

## 2024-02-09 DIAGNOSIS — Z046 Encounter for general psychiatric examination, requested by authority: Secondary | ICD-10-CM | POA: Diagnosis not present

## 2024-02-09 DIAGNOSIS — Z008 Encounter for other general examination: Secondary | ICD-10-CM | POA: Diagnosis not present

## 2024-02-09 DIAGNOSIS — N3000 Acute cystitis without hematuria: Secondary | ICD-10-CM

## 2024-02-09 DIAGNOSIS — N179 Acute kidney failure, unspecified: Secondary | ICD-10-CM | POA: Diagnosis not present

## 2024-02-09 DIAGNOSIS — M0579 Rheumatoid arthritis with rheumatoid factor of multiple sites without organ or systems involvement: Secondary | ICD-10-CM

## 2024-02-09 DIAGNOSIS — R296 Repeated falls: Secondary | ICD-10-CM | POA: Diagnosis not present

## 2024-02-09 LAB — BASIC METABOLIC PANEL WITH GFR
Anion gap: 5 (ref 5–15)
BUN: 32 mg/dL — ABNORMAL HIGH (ref 8–23)
CO2: 24 mmol/L (ref 22–32)
Calcium: 7.7 mg/dL — ABNORMAL LOW (ref 8.9–10.3)
Chloride: 110 mmol/L (ref 98–111)
Creatinine, Ser: 1 mg/dL (ref 0.44–1.00)
GFR, Estimated: 56 mL/min — ABNORMAL LOW (ref >60)
Glucose, Bld: 75 mg/dL (ref 70–99)
Potassium: 3.4 mmol/L — ABNORMAL LOW (ref 3.5–5.1)
Sodium: 139 mmol/L (ref 135–145)

## 2024-02-09 LAB — RPR: RPR Ser Ql: NONREACTIVE

## 2024-02-09 LAB — HIV ANTIBODY (ROUTINE TESTING W REFLEX): HIV Screen 4th Generation wRfx: NONREACTIVE

## 2024-02-09 LAB — TSH: TSH: 6.189 u[IU]/mL — ABNORMAL HIGH (ref 0.350–4.500)

## 2024-02-09 LAB — CK: Total CK: 513 U/L — ABNORMAL HIGH (ref 38–234)

## 2024-02-09 LAB — VITAMIN B12: Vitamin B-12: 482 pg/mL (ref 180–914)

## 2024-02-09 MED ORDER — POLYETHYLENE GLYCOL 3350 17 G PO PACK
17.0000 g | PACK | Freq: Every day | ORAL | Status: DC
  Administered 2024-02-09 – 2024-02-10 (×2): 17 g via ORAL
  Filled 2024-02-09 (×2): qty 1

## 2024-02-09 MED ORDER — ENOXAPARIN SODIUM 40 MG/0.4ML IJ SOSY
40.0000 mg | PREFILLED_SYRINGE | INTRAMUSCULAR | Status: DC
  Administered 2024-02-09 – 2024-03-10 (×31): 40 mg via SUBCUTANEOUS
  Filled 2024-02-09 (×31): qty 0.4

## 2024-02-09 MED ORDER — POTASSIUM CHLORIDE CRYS ER 20 MEQ PO TBCR
40.0000 meq | EXTENDED_RELEASE_TABLET | Freq: Once | ORAL | Status: AC
  Administered 2024-02-09: 40 meq via ORAL
  Filled 2024-02-09: qty 2

## 2024-02-09 MED ORDER — SENNA 8.6 MG PO TABS
1.0000 | ORAL_TABLET | Freq: Every day | ORAL | Status: DC
  Administered 2024-02-09 – 2024-02-10 (×2): 8.6 mg via ORAL
  Filled 2024-02-09 (×2): qty 1

## 2024-02-09 MED ORDER — SODIUM CHLORIDE 0.9 % IV SOLN
1.0000 g | INTRAVENOUS | Status: DC
  Administered 2024-02-09 – 2024-02-10 (×2): 1 g via INTRAVENOUS
  Filled 2024-02-09 (×2): qty 10

## 2024-02-09 MED ORDER — LEVOTHYROXINE SODIUM 88 MCG PO TABS
88.0000 ug | ORAL_TABLET | Freq: Every day | ORAL | Status: DC
  Administered 2024-02-10 – 2024-03-11 (×31): 88 ug via ORAL
  Filled 2024-02-09 (×31): qty 1

## 2024-02-09 NOTE — Assessment & Plan Note (Addendum)
 E. coli and Klebsiella growing out of urine culture.  Completed antibiotic course.

## 2024-02-09 NOTE — Plan of Care (Signed)
  Problem: Education: Goal: Knowledge of General Education information will improve Description: Including pain rating scale, medication(s)/side effects and non-pharmacologic comfort measures Outcome: Not Progressing   Problem: Health Behavior/Discharge Planning: Goal: Ability to manage health-related needs will improve Outcome: Not Progressing   Problem: Clinical Measurements: Goal: Ability to maintain clinical measurements within normal limits will improve Outcome: Progressing Goal: Will remain free from infection Outcome: Progressing Goal: Diagnostic test results will improve Outcome: Progressing Goal: Respiratory complications will improve Outcome: Progressing Goal: Cardiovascular complication will be avoided Outcome: Progressing   Problem: Activity: Goal: Risk for activity intolerance will decrease Outcome: Progressing   Problem: Coping: Goal: Level of anxiety will decrease Outcome: Not Progressing

## 2024-02-09 NOTE — Progress Notes (Signed)
 Progress Note   Patient: [name] FMW:[id number] DOB: [birth year] DOA: 02/07/2024     1 DOS: the patient was seen and examined on 02/09/2024   Brief hospital course: 83 y.o. female with medical history significant for Hypothyroidism, rheumatoid arthritis on chronic prednisone and Renflexis  infusions, followed by rheumatology, osteoarthritis, PCP documentation of several falls requiring ED visits, and who currently uses a walker, who is being admitted for rhabdomyolysis after she was found down by a neighbor.  She was on the floor for over 24 hours. In the ED she had a low-grade temperature of 99.1 with otherwise normal vitals.  Labs showed normal CBC, Creatinine of 1.85 up from baseline of 1.1 to with bicarb of 19.  Mild LFT elevation with AST 43 and total bilirubin 2.7.  Troponin 17.  Urinalysis with many bacteria and small leuks.  EKG showed sinus arrhythmia at 88 CT head and C-spine without acute injury.  Chest x-ray nonacute showing multiple degenerative changes x-ray of the knee showing moderate right knee effusion among other findings-please see report. Patient treated with 2 L NS bolus and pain medicine.  Admission requested.   6/21.  CK down to 861 and creatinine down to 1.36.  Continue IV fluids.  PT evaluation.  Patient does not have any recollection of how she got to the hospital. 6/22.  CK down to 513 and creatinine down to 1.0.  Appreciate psychiatric consultation.  Assessment and Plan: * Rhabdomyolysis Continue IV fluid hydration.  CK trending down from 1195 down to 861.  AKI (acute kidney injury) (HCC) Continue IV fluid hydration.  Creatinine improved from 1.85 down to 1.36.  Potentially has underlying chronic kidney disease stage IIIa..  Will see how her creatinine improves with IV fluid  Acute cystitis without hematuria E. coli and Klebsiella growing out of urine culture will start Rocephin.  Multiple falls Lives by herself.  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  Spoke with TOC to set up an APS report.  Rheumatoid arthritis involving multiple sites [facility]) On chronic immunosuppressive therapy Patient takes methotrexate and folic acid .  Patient states she does not take prednisone on a daily basis.  Will hold for right now.  Acquired hypothyroidism Continue levothyroxine at slightly increased dose with TSH being slightly elevated.  Pressure injury of skin Stage 3 b/l buttocks, present on admission.  Memory loss I am concerned that she was unable to tell me any history of how she got to the hospital.  Patient's niece tells me that she repeatedly and asked the same question.  Concern for underlying dementia.  0 out of 3 on short-term recall.  TSH slightly elevated, RPR nonreactive, HIV nonreactive, vitamin B12 normal range.        Subjective: Patient feels okay.  Offers no complaints.  Short-term memory recall is poor with 0 out of 3 recall but able to answer some other questions like day of the week of the year and the president and able to spell the word world backwards.  Patient admitted with a fall and being found down.  Found to have rhabdomyolysis and acute kidney injury.  Physical Exam: Vitals:   02/08/24 1544 02/08/24 2016 02/09/24 0505 02/09/24 0802  BP: 109/61 (!) 115/54 125/60 (!) 133/58  Pulse: 73 68 67 63  Resp: 16 16 20 16   Temp: 98.5 F (36.9 C) 98.5 F (36.9 C) 98.1 F (36.7 C) 98 F (36.7 C)  TempSrc:  SpO2: 100% 98% 98% 98%  Weight:      Height:       Physical Exam HENT:     Head: Normocephalic.   Eyes:     General: Lids are normal.     Conjunctiva/sclera: Conjunctivae normal.    Cardiovascular:     Rate and Rhythm: Normal rate and regular rhythm.     Heart sounds: Normal heart sounds, S1 normal and S2 normal.  Pulmonary:     Breath sounds: No decreased breath sounds, wheezing, rhonchi or rales.  Abdominal:     Palpations: Abdomen is soft.     Tenderness:  There is no abdominal tenderness.   Musculoskeletal:     Right lower leg: No swelling.     Left lower leg: No swelling.   Skin:    General: Skin is warm.     Findings: No rash.   Neurological:     Mental Status: She is alert.     Comments: Able to follow commands and straight leg raise.  Unable to tell me any history about how she got here.  Patient with 0 out of 3 with short-term recall.  Able to spell the word world backwards, knows the president, knows the day of the week and the year.  Unable to tell me today's date though.    Data Reviewed: Potassium 3.4, creatinine 1.0, CK5 13, B12 482, TSH 6.189, RPR nonreactive HIV negative.  Family Communication: Spoke with cousin Beth on the phone  Disposition: Status is: Inpatient Remains inpatient appropriate because: Will keep the patient here and reevaluate tomorrow.  Psychiatry saw the patient and deemed the patient capable of making their own medical decisions upon going home.  Will have to set up APS referral.  Planned Discharge Destination: Physical therapy recommending rehab but patient wants to go home.    Time spent: 28 minutes  Author: Charlie Patterson, MD 02/09/2024 12:41 PM  For on call review [email or web address].

## 2024-02-09 NOTE — Consult Note (Signed)
 [facility] Face-to-Face Psychiatry Consult   Reason for Consult:   capacity evaluation -   Disposition-  home versus SNF Referring Physician:  JOSETTE ADE   Patient Identification: [name] MRN:  [id number] Principal Diagnosis: Rhabdomyolysis Diagnosis:  Principal Problem:   Rhabdomyolysis Active Problems:   Acquired hypothyroidism   Rheumatoid arthritis involving multiple sites (HCC)   On chronic immunosuppressive therapy   Multiple falls   AKI (acute kidney injury) (HCC)   Memory loss   Pressure injury of skin   Acute cystitis without hematuria   Subjective:   [name] is a 83 y.o. female patient admitted for rhabdomyolysis after she was found down by a neighbor   HPI:  83 y.o. female with  no past psych history and past medical history significant for Hypothyroidism, rheumatoid arthritis on chronic prednisone and Renflexis  infusions, followed by rheumatology, osteoarthritis, PCP documentation of several falls requiring ED visits, and who currently uses a walker, who is being admitted for rhabdomyolysis after she was found down by a neighbor.  She was on the floor for over 24 hours.  Psychiatric consulted for capacity evaluation regarding possible disposition of home versus SNF.   Patient was interviewed along with the nurse at the bedside.  Patient was alert (able to tell that she is in hospital, month, year, current president - Trump), engage and able to verbalize that she came to the hospital because she fell.  I fell at home and could not get up this has happened few times in the past. .  Patient reports that she had necklace alert and has used in emergency situation in past.This writer talked to the patient about possible SNF versus assisted living place for the patient.  Patient reports that she understand that rehab option but reports that I will be more careful and still wants to go home.  Patient reports that if she fall again - she probably will choose to go to  rehab at that time.    Patient has consistently stated preference of going home after discharge to this writer and to the primary team and nursing staff earlier.   Patient able to demonstrate understanding and able to appreciate risk of going home and potentially having a fall or worse including accidental death in her own words.  Patient had articulated reasoning about going home - staying in own / familiar surrounding and reports that her neighbors checks on her and she will use her alert button if needed.    Past Psychiatric History:  None reported.   No current outpatient psychiatrist.   No current outpatient therapist.   No previous inpatient psychiatric admissions.   No previous suicide attempts.     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 Social History:  Social History   Substance and Sexual Activity  Alcohol Use No     Social History   Substance and Sexual Activity  Drug Use No    Social History   Socioeconomic History   Marital status: Widowed    Spouse name: Not on file   Number of children: Not on file   Years of education: Not on file   Highest education level: Not on  file  Occupational History   Not on file  Tobacco Use   Smoking status: Never   Smokeless tobacco: Never  Vaping Use   Vaping status: Never Used  Substance and Sexual Activity   Alcohol use: No   Drug use: No   Sexual activity: Not Currently  Other Topics Concern   Not on file  Social History Narrative   Not on file   Social Drivers of Health   Financial Resource Strain: Low Risk  (01/25/2023)   Received from [facility]   Overall Financial Resource Strain (CARDIA)    Difficulty of Paying Living Expenses: Not very hard  Food Insecurity: Patient Unable To Answer (02/08/2024)   Hunger Vital Sign    Worried About Running Out of Food in the Last Year: Patient unable to answer    Ran Out of Food in the Last Year: Patient unable to answer  Transportation Needs: Patient Unable To Answer (02/08/2024)   PRAPARE - Transportation    Lack of Transportation (Medical): Patient unable to answer    Lack of Transportation (Non-Medical): Patient unable to answer  Physical Activity: Not on file  Stress: Not on file  Social Connections: Patient Unable To Answer (02/08/2024)   Social Connection and Isolation Panel    Frequency of Communication with Friends and Family: Patient unable to answer    Frequency of Social Gatherings with Friends and Family: Patient unable to answer    Attends Religious Services: Patient unable to answer    Active Member of Clubs or Organizations: Patient unable to answer    Attends Banker Meetings: Patient unable to answer    Marital Status: Patient unable to answer   Additional Social History:    Allergies:  No Known Allergies  Labs:  Results for orders placed or performed during the hospital encounter of 02/07/24 (from the past 48 hours)  CBC     Status: None   Collection Time: 02/07/24  6:45 PM  Result Value Ref Range   WBC 4.2 4.0 - 10.5 K/uL   RBC 4.06 3.87 - 5.11 MIL/uL   Hemoglobin 12.4 12.0 - 15.0 g/dL   HCT 62.2 63.9 -  53.9 %   MCV 92.9 80.0 - 100.0 fL   MCH 30.5 26.0 - 34.0 pg   MCHC 32.9 30.0 - 36.0 g/dL   RDW 85.7 88.4 - 84.4 %   Platelets 162 150 - 400 K/uL   nRBC 0.0 0.0 - 0.2 %    Comment: Performed at [facility], [address] [place], [place] [address]  Comprehensive metabolic panel     Status: Abnormal   Collection Time: 02/07/24  6:45 PM  Result Value Ref Range   Sodium 141 135 - 145 mmol/L   Potassium 4.1 3.5 - 5.1 mmol/L   Chloride 107 98 - 111 mmol/L   CO2 19 (L)  22 - 32 mmol/L   Glucose, Bld 147 (H) 70 - 99 mg/dL    Comment: Glucose reference range applies only to samples taken after fasting for at least 8 hours.   BUN 47 (H) 8 - 23 mg/dL   Creatinine, Ser 8.14 (H) 0.44 - 1.00 mg/dL   Calcium 8.6 (L) 8.9 - 10.3 mg/dL   Total Protein 7.2 6.5 - 8.1 g/dL   Albumin 3.3 (L) 3.5 - 5.0 g/dL   AST 43 (H) 15 - 41 U/L   ALT 14 0 - 44 U/L   Alkaline Phosphatase 62 38 - 126 U/L   Total Bilirubin 2.3 (H) 0.0 - 1.2 mg/dL   GFR, Estimated 27 (L) >60 mL/min    Comment: (NOTE) Calculated using the CKD-EPI Creatinine Equation (2021)    Anion gap 15 5 - 15    Comment: Performed at [facility], [address] [place], [place] [address]  CK     Status: Abnormal   Collection Time: 02/07/24  6:45 PM  Result Value Ref Range   Total CK 1,195 (H) 38 - 234 U/L    Comment: Performed at [facility], [address], [place], [place] [address]  Troponin I (High Sensitivity)     Status: None   Collection Time: 02/07/24  6:45 PM  Result Value Ref Range   Troponin I (High Sensitivity) 17 <18 ng/L    Comment: (NOTE) Elevated high sensitivity troponin I (hsTnI) values and significant  changes across serial measurements may suggest ACS but many other  chronic and acute conditions are known to elevate hsTnI results.  Refer to the Links section for chest pain algorithms and additional  guidance. Performed at [facility], [address] [place], [place]  [address]   Urinalysis, w/ Reflex to Culture (Infection Suspected) -Urine, Clean Catch     Status: Abnormal   Collection Time: 02/07/24  8:36 PM  Result Value Ref Range   Specimen Source URINE, CLEAN CATCH    Color, Urine YELLOW (A) YELLOW   APPearance TURBID (A) CLEAR   Specific Gravity, Urine 1.018 1.005 - 1.030   pH 6.0 5.0 - 8.0   Glucose, UA NEGATIVE NEGATIVE mg/dL   Hgb urine dipstick NEGATIVE NEGATIVE   Bilirubin Urine NEGATIVE NEGATIVE   Ketones, ur 5 (A) NEGATIVE mg/dL   Protein, ur 899 (A) NEGATIVE mg/dL   Nitrite NEGATIVE NEGATIVE   Leukocytes,Ua SMALL (A) NEGATIVE   RBC / HPF 0-5 0 - 5 RBC/hpf   WBC, UA 11-20 0 - 5 WBC/hpf    Comment:        Reflex urine culture not performed if WBC <=10, OR if Squamous epithelial cells >5. If Squamous epithelial cells >5 suggest recollection.    Bacteria, UA MANY (A) NONE SEEN   Squamous Epithelial / HPF >50 0 - 5 /HPF    Comment: Performed at [facility], [address] [place], [place] [address]  Urine Culture     Status: Abnormal (Preliminary result)   Collection Time: 02/07/24  8:36 PM   Specimen: Urine, Random  Result Value Ref Range   Specimen Description      URINE, RANDOM Performed at [facility], [address], [place], [place] [address]    Special Requests      NONE Reflexed from [id number] Performed at [facility], [address] [place], [place] [address]    Culture (A)     >=100,000 COLONIES/mL ESCHERICHIA COLI 50,000 COLONIES/mL KLEBSIELLA PNEUMONIAE  SUSCEPTIBILITIES TO FOLLOW Performed at [facility], [address], [place], [place] [address]    Report Status PENDING   Troponin I (High Sensitivity)     Status: Abnormal   Collection Time: 02/07/24  9:44 PM  Result Value Ref Range   Troponin I (High Sensitivity) 26 (H) <18 ng/L    Comment: (NOTE) Elevated high sensitivity troponin I (hsTnI) values and significant  changes across serial measurements may suggest ACS but  many other  chronic and acute conditions are known to elevate hsTnI results.  Refer to the Links section for chest pain algorithms and additional  guidance. Performed at [facility], [address] [place], [place] [address]   Comprehensive metabolic panel     Status: Abnormal   Collection Time: 02/08/24  6:05 AM  Result Value Ref Range   Sodium 137 135 - 145 mmol/L   Potassium 3.6 3.5 - 5.1 mmol/L   Chloride 107 98 - 111 mmol/L   CO2 22 22 - 32 mmol/L   Glucose, Bld 84 70 - 99 mg/dL    Comment: Glucose reference range applies only to samples taken after fasting for at least 8 hours.   BUN 38 (H) 8 - 23 mg/dL   Creatinine, Ser 8.63 (H) 0.44 - 1.00 mg/dL   Calcium 7.3 (L) 8.9 - 10.3 mg/dL   Total Protein 5.8 (L) 6.5 - 8.1 g/dL   Albumin 2.7 (L) 3.5 - 5.0 g/dL   AST 31 15 - 41 U/L   ALT 12 0 - 44 U/L   Alkaline Phosphatase 48 38 - 126 U/L   Total Bilirubin 1.4 (H) 0.0 - 1.2 mg/dL   GFR, Estimated 39 (L) >60 mL/min    Comment: (NOTE) Calculated using the CKD-EPI Creatinine Equation (2021)    Anion gap 8 5 - 15    Comment: Performed at [facility], [address] [place], [place] [address]  CBC     Status: Abnormal   Collection Time: 02/08/24  6:05 AM  Result Value Ref Range   WBC 4.3 4.0 - 10.5 K/uL   RBC 3.64 (L) 3.87 - 5.11 MIL/uL   Hemoglobin 10.7 (L) 12.0 - 15.0 g/dL   HCT 66.3 (L) 63.9 - 53.9 %   MCV 92.3 80.0 - 100.0 fL   MCH 29.4 26.0 - 34.0 pg   MCHC 31.8 30.0 - 36.0 g/dL   RDW 85.4 88.4 - 84.4 %   Platelets 146 (L) 150 - 400 K/uL   nRBC 0.0 0.0 - 0.2 %    Comment: Performed at [facility], [address] [place], [place] [address]  CK     Status: Abnormal   Collection Time: 02/08/24  6:05 AM  Result Value Ref Range   Total CK 861 (H) 38 - 234 U/L    Comment: Performed at [facility], [address] [place], [place] [address]  HIV Antibody (routine testing w rflx)     Status: None   Collection Time: 02/09/24   1:34 AM  Result Value Ref Range   HIV Screen 4th Generation wRfx Non Reactive Non Reactive    Comment: Performed at [facility], [address], [place], [place] [address]  RPR     Status: None   Collection Time: 02/09/24  1:34 AM  Result Value Ref Range   RPR Ser Ql NON REACTIVE NON REACTIVE    Comment: Performed at [facility], [address] [place], [place]  [address]  TSH     Status: Abnormal   Collection Time: 02/09/24  1:34 AM  Result Value Ref Range   TSH 6.189 (H) 0.350 - 4.500 uIU/mL    Comment: Performed by a 3rd Generation assay with a functional sensitivity of <=0.01 uIU/mL. Performed at [facility], [address] [place], [place] [address]   Vitamin B12     Status: None   Collection Time: 02/09/24  1:34 AM  Result Value Ref Range   Vitamin B-12 482 180 - 914 pg/mL    Comment: (NOTE) This assay is not validated for testing neonatal or myeloproliferative syndrome specimens for Vitamin B12 levels. Performed at [facility], [address], [place], [place] [address]   CK     Status: Abnormal   Collection Time: 02/09/24  1:34 AM  Result Value Ref Range   Total CK 513 (H) 38 - 234 U/L    Comment: Performed at [facility], [address] [place], [place] [address]  Basic metabolic panel     Status: Abnormal   Collection Time: 02/09/24  1:34 AM  Result Value Ref Range   Sodium 139 135 - 145 mmol/L   Potassium 3.4 (L) 3.5 - 5.1 mmol/L   Chloride 110 98 - 111 mmol/L   CO2 24 22 - 32 mmol/L   Glucose, Bld 75 70 - 99 mg/dL    Comment: Glucose reference range applies only to samples taken after fasting for at least 8 hours.   BUN 32 (H) 8 - 23 mg/dL   Creatinine, Ser 8.99 0.44 - 1.00 mg/dL   Calcium 7.7 (L) 8.9 - 10.3 mg/dL   GFR, Estimated 56 (L) >60 mL/min    Comment: (NOTE) Calculated using the CKD-EPI Creatinine Equation (2021)    Anion gap 5 5 - 15    Comment: Performed at [facility], [address]  [place], [place] [address]    Current Facility-Administered Medications  Medication Dose Route Frequency Provider Last Rate Last Admin   0.9 %  sodium chloride  infusion   Intravenous Continuous Josette Ade, MD 75 mL/hr at 02/09/24 0821 New Bag at 02/09/24 0821   acetaminophen  (TYLENOL ) tablet 650 mg  650 mg Oral Q6H PRN Duncan, Hazel V, MD       Or   acetaminophen  (TYLENOL ) suppository 650 mg  650 mg Rectal Q6H PRN Duncan, Hazel V, MD       aspirin  EC tablet 81 mg  81 mg Oral Daily Josette Ade, MD   81 mg at 02/09/24 0814   cefTRIAXone (ROCEPHIN) 1 g in sodium chloride  0.9 % 100 mL IVPB  1 g Intravenous Q24H Josette Ade, MD 200 mL/hr at 02/09/24 1303 1 g at 02/09/24 1303   cyanocobalamin  (VITAMIN B12) tablet 1,000 mcg  1,000 mcg Oral Daily Josette Ade, MD   1,000 mcg at 02/09/24 9185   enoxaparin  (LOVENOX ) injection 40 mg  40 mg Subcutaneous Q24H Hallaji, Sheema M, RPH       ferrous sulfate  tablet 325 mg  325 mg Oral Daily Josette Ade, MD   325 mg at 02/09/24 9185   folic acid  (FOLVITE ) tablet 1 mg  1 mg Oral Daily Josette Ade, MD   1 mg at 02/09/24 9185   HYDROcodone -acetaminophen  (NORCO/VICODIN) 5-325 MG per tablet 1-2 tablet  1-2 tablet Oral Q4H PRN Duncan, Hazel V, MD       [START ON 02/10/2024] levothyroxine (SYNTHROID) tablet 88 mcg  88 mcg Oral Q0600 Josette Ade,  MD       morphine  (PF) 2 MG/ML injection 2 mg  2 mg Intravenous Q2H PRN Duncan, Hazel V, MD       ondansetron  (ZOFRAN ) tablet 4 mg  4 mg Oral Q6H PRN Cleatus Delayne GAILS, MD       Or   ondansetron  (ZOFRAN ) injection 4 mg  4 mg Intravenous Q6H PRN Duncan, Hazel V, MD       polyethylene glycol (MIRALAX / GLYCOLAX) packet 17 g  17 g Oral Daily Josette Ade, MD   17 g at 02/09/24 1112   senna (SENOKOT) tablet 8.6 mg  1 tablet Oral Daily Josette Ade, MD   8.6 mg at 02/09/24 1112    Musculoskeletal:   Psychiatric Specialty Exam:  Mental status exam - Appearance- casual grooming byrd. Appears  stated age.  In hospital clothes.  Extremely thin. Alert and oriented -   Month, year, president, place.  Able to tell her name,  Behavior - cooperative.  No acute distress. Motor activity-No psychomotor agitation or retardation noted. Speech - normal rate rhythm volume and tone. Prosody - within normal limits Mood-  "fine" Affect - euthymic. Thought Perception-No auditory and visual hallucinations. Does not appear to be responding to internal stimuli . Thought content -within normal limits.  No suicidal or homicidal thoughts. Thought process -logical and linear.  Association intact . Fund of knowledge -intact . Attention -intact . Insight and judgment fair. Estimated Level of intellectual functioning-average.    Physical Exam: Physical Exam ROS Blood pressure (!) 133/58, pulse 63, temperature 98 F (36.7 C), resp. rate 16, height 5' 6 (1.676 m), weight 48.5 kg, SpO2 98%. Body mass index is 17.27 kg/m.  Treatment Plan Summary:  83 y.o. female with  no past psych history and past medical history significant for Hypothyroidism, rheumatoid arthritis on chronic prednisone and Renflexis  infusions, followed by rheumatology, osteoarthritis, PCP documentation of several falls requiring ED visits, and who currently uses a walker, who is being admitted for rhabdomyolysis after she was found down by a neighbor.  She was on the floor for over 24 hours.  Psychiatric consulted for capacity evaluation regarding possible disposition of home versus SNF.   Patient DOES have the capacity to choose to go home versus SNF at this time.   Concerns for living situation please consider involving APS.    It is important to note that the decision making capacity is fluid and may fluctuate over time depending on various factors.  If there is any concerns regarding capacity or inform decisions please re-consult Psychiatry   For further assistance and guidance.   Thank you for the consult  Desmond Chimera, MD 02/09/2024 3:04 PM

## 2024-02-09 NOTE — Plan of Care (Signed)
  Problem: Education: Goal: Knowledge of General Education information will improve Description: Including pain rating scale, medication(s)/side effects and non-pharmacologic comfort measures Outcome: Progressing   Problem: Clinical Measurements: Goal: Ability to maintain clinical measurements within normal limits will improve Outcome: Progressing Goal: Will remain free from infection Outcome: Progressing Goal: Diagnostic test results will improve Outcome: Progressing Goal: Respiratory complications will improve Outcome: Progressing Goal: Cardiovascular complication will be avoided Outcome: Progressing   Problem: Nutrition: Goal: Adequate nutrition will be maintained Outcome: Progressing   Problem: Coping: Goal: Level of anxiety will decrease Outcome: Progressing   Problem: Safety: Goal: Ability to remain free from injury will improve Outcome: Progressing   Problem: Skin Integrity: Goal: Risk for impaired skin integrity will decrease Outcome: Progressing

## 2024-02-09 NOTE — Progress Notes (Signed)
 PHARMACIST - PHYSICIAN COMMUNICATION  CONCERNING:  Enoxaparin  (Lovenox ) for DVT Prophylaxis    RECOMMENDATION: Patient was prescribed enoxaprin 30mg  q24 hours for VTE prophylaxis. Renal function now improved   Filed Weights   02/07/24 1842  Weight: 48.5 kg (107 lb)    Body mass index is 17.27 kg/m.  Estimated Creatinine Clearance: 33.2 mL/min (by C-G formula based on SCr of 1 mg/dL).   Based on [facility] policy patient is candidate for enoxaparin  40mg  SQ every 24 hours based on CrCl >30 and Weight >45kg   DESCRIPTION: Pharmacy has adjusted enoxaparin  dose per [facility] policy.  Patient is now receiving enoxaparin  40 mg every 24 hours   Estill CHRISTELLA Lutes, PharmD, BCPS Clinical Pharmacist 02/09/2024 7:53 AM

## 2024-02-10 DIAGNOSIS — N179 Acute kidney failure, unspecified: Secondary | ICD-10-CM | POA: Diagnosis not present

## 2024-02-10 DIAGNOSIS — E43 Unspecified severe protein-calorie malnutrition: Secondary | ICD-10-CM

## 2024-02-10 DIAGNOSIS — N3000 Acute cystitis without hematuria: Secondary | ICD-10-CM | POA: Diagnosis not present

## 2024-02-10 DIAGNOSIS — R296 Repeated falls: Secondary | ICD-10-CM | POA: Diagnosis not present

## 2024-02-10 DIAGNOSIS — T796XXS Traumatic ischemia of muscle, sequela: Secondary | ICD-10-CM | POA: Diagnosis not present

## 2024-02-10 LAB — URINE CULTURE: Culture: >=100000 — AB

## 2024-02-10 MED ORDER — ADULT MULTIVITAMIN W/MINERALS CH
1.0000 | ORAL_TABLET | Freq: Every day | ORAL | Status: DC
  Administered 2024-02-10 – 2024-02-12 (×3): 1
  Filled 2024-02-10 (×3): qty 1

## 2024-02-10 MED ORDER — VITAMIN C 500 MG PO TABS
500.0000 mg | ORAL_TABLET | Freq: Two times a day (BID) | ORAL | Status: DC
  Administered 2024-02-10 – 2024-03-11 (×60): 500 mg via ORAL
  Filled 2024-02-10 (×60): qty 1

## 2024-02-10 MED ORDER — ZINC SULFATE 220 (50 ZN) MG PO CAPS
220.0000 mg | ORAL_CAPSULE | Freq: Every day | ORAL | Status: AC
  Administered 2024-02-10 – 2024-02-23 (×14): 220 mg via ORAL
  Filled 2024-02-10 (×14): qty 1

## 2024-02-10 MED ORDER — ENSURE PLUS HIGH PROTEIN PO LIQD
237.0000 mL | Freq: Three times a day (TID) | ORAL | Status: DC
  Administered 2024-02-10 – 2024-02-17 (×16): 237 mL via ORAL

## 2024-02-10 MED ORDER — CEPHALEXIN 500 MG PO CAPS
500.0000 mg | ORAL_CAPSULE | Freq: Two times a day (BID) | ORAL | Status: AC
  Administered 2024-02-11 – 2024-02-13 (×6): 500 mg via ORAL
  Filled 2024-02-10 (×6): qty 1

## 2024-02-10 NOTE — Progress Notes (Signed)
 Progress Note   Patient: [name] FMW:[id number] DOB: Apr 05, 1941 DOA: 02/07/2024     2 DOS: the patient was seen and examined on 02/10/2024   Brief hospital course: 83 y.o. female with medical history significant for Hypothyroidism, rheumatoid arthritis on chronic prednisone and Renflexis  infusions, followed by rheumatology, osteoarthritis, PCP documentation of several falls requiring ED visits, and who currently uses a walker, who is being admitted for rhabdomyolysis after she was found down by a neighbor.  She was on the floor for over 24 hours. In the ED she had a low-grade temperature of 99.1 with otherwise normal vitals.  Labs showed normal CBC, Creatinine of 1.85 up from baseline of 1.1 to with bicarb of 19.  Mild LFT elevation with AST 43 and total bilirubin 2.7.  Troponin 17.  Urinalysis with many bacteria and small leuks.  EKG showed sinus arrhythmia at 88 CT head and C-spine without acute injury.  Chest x-ray nonacute showing multiple degenerative changes x-ray of the knee showing moderate right knee effusion among other findings-please see report. Patient treated with 2 L NS bolus and pain medicine.  Admission requested.   6/21.  CK down to 861 and creatinine down to 1.36.  Continue IV fluids.  PT evaluation.  Patient does not have any recollection of how she got to the hospital. 6/22.  CK down to 513 and creatinine down to 1.0.  Appreciate psychiatric consultation.  Assessment and Plan: * Rhabdomyolysis CK improved from 1195 down to 513.  AKI (acute kidney injury) (HCC) Creatinine improved from 1.85 down to 1.0  Acute cystitis without hematuria E. coli and Klebsiella growing out of urine culture.  Rocephin changed over to Keflex.  Multiple falls Lives by herself.  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  Spoke with TOC to set up an APS report.  Patient agreeable to rehab.  Rheumatoid arthritis involving multiple sites  [facility]) On chronic immunosuppressive therapy Patient takes methotrexate and folic acid .  Patient states she does not take prednisone on a daily basis.  Will hold for right now.  Acquired hypothyroidism Continue levothyroxine at slightly increased dose with TSH being slightly elevated.  Protein-calorie malnutrition, severe Continue supplements  Pressure injury of skin Wound care nurse reading as deep tissue pressure injury bilateral buttock areas.  Memory loss I am concerned that she was unable to tell me any history of how she got to the hospital.  Patient's niece tells me that she repeatedly and asked the same question.  Concern for underlying dementia.  0 out of 3 on short-term recall.  TSH slightly elevated, RPR nonreactive, HIV nonreactive, vitamin B12 normal range.       Subjective: Patient feels okay offers no complaints.  Admitted with being found down on the floor in her house after a fall and found to have rhabdomyolysis and acute kidney injury.  Physical Exam: Vitals:   02/09/24 1741 02/09/24 1941 02/10/24 0512 02/10/24 0837  BP: (!) 148/62 (!) 141/70 129/79 132/67  Pulse: 68 73 76 73  Resp: 18 16 18 18   Temp: 98.5 F (36.9 C) 98.8 F (37.1 C) 97.8 F (36.6 C) 97.6 F (36.4 C)  TempSrc:    Oral  SpO2: 100% 99% 98% 98%  Weight:      Height:       Physical Exam HENT:     Head: Normocephalic.   Eyes:     General: Lids are normal.     Conjunctiva/sclera:  Conjunctivae normal.    Cardiovascular:     Rate and Rhythm: Normal rate and regular rhythm.     Heart sounds: Normal heart sounds, S1 normal and S2 normal.  Pulmonary:     Breath sounds: No decreased breath sounds, wheezing, rhonchi or rales.  Abdominal:     Palpations: Abdomen is soft.     Tenderness: There is no abdominal tenderness.   Musculoskeletal:     Right lower leg: No swelling.     Left lower leg: No swelling.   Skin:    General: Skin is warm.     Findings: No rash.   Neurological:      Mental Status: She is alert.        Data Reviewed: No labs today  Family Communication: Left message for cousin  Disposition: Status is: Inpatient Remains inpatient appropriate because: Patient agreeable to rehab.  TOC to look into rehabs  Planned Discharge Destination: Rehab    Time spent: 27 minutes Case discussed with nursing staff and wound care nurse.  Author: Charlie Patterson, MD 02/10/2024 3:39 PM  For on call review [email or web address].
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
## 2024-02-10 NOTE — Assessment & Plan Note (Signed)
 Continue supplements

## 2024-02-10 NOTE — Progress Notes (Signed)
 Physical Therapy Treatment Patient Details Name: [name] MRN: [id number] DOB: 12-Mar-1941 Today's Date: 02/10/2024   History of Present Illness [name] is a 83 y.o. female with medical history significant for Hypothyroidism, rheumatoid arthritis on chronic prednisone and Renflexis  infusions, followed by rheumatology, osteoarthritis, PCP documentation of several falls requiring ED visits, and who currently uses a walker, who is being admitted for rhabdomyolysis after she was found down by a neighbor.  She was on the floor for over 24 hours.    PT Comments  Pt was pleasant and motivated to participate during the session and put forth good effort throughout. Pt required VC's for proper hand placement for STS from chair with RW. Pt ambulated with CGA with RW and no LOBs occurred. VC's were utilized to encourage upright posture during ambulation with minimal pt carryover. Pt was heavily reliant on RW to maintain balance during gait and demonstrated minimal step length bilaterally with minimal foot clearance, which combined with recent fall history makes the pt a high risk for future falls. Pt reported no adverse symptoms during the session with SpO2 and HR WNL throughout on room air. Pt will benefit from continued PT services upon discharge to safely address deficits listed in patient problem list for decreased caregiver assistance and eventual return to PLOF.      If plan is discharge home, recommend the following: Assistance with cooking/housework;A little help with bathing/dressing/bathroom;A little help with walking and/or transfers;Supervision due to cognitive status;Assist for transportation;Help with stairs or ramp for entrance   Can travel by private vehicle     Yes  Equipment Recommendations  BSC/3in1    Recommendations for Other Services       Precautions / Restrictions Precautions Precautions: Fall Recall of Precautions/Restrictions: Impaired Restrictions Weight Bearing  Restrictions Per Provider Order: No     Mobility  Bed Mobility               General bed mobility comments: Not assessed; pt received in chair    Transfers Overall transfer level: Needs assistance Equipment used: Rolling walker (2 wheels) Transfers: Sit to/from Stand Sit to Stand: Contact guard assist           General transfer comment: Pt required no physical assistance to come to standing from chair, VC's required for proper hand placement    Ambulation/Gait Ambulation/Gait assistance: Contact guard assist Gait Distance (Feet): 225 Feet Assistive device: Rolling walker (2 wheels) Gait Pattern/deviations: Step-to pattern, Step-through pattern, Decreased step length - right, Decreased step length - left, Trunk flexed, Shuffle Gait velocity: decreased     General Gait Details: Pt required CGA for ambulation with RW. Pt demonstrated very minimal step length and flexed trunk over RW during ambulation. VC's were utilized to encourage upright posture with minimal pt carryover. Pt remained steady and no LOBs occured.   Stairs             Wheelchair Mobility     Tilt Bed    Modified Rankin (Stroke Patients Only)       Balance Overall balance assessment: Needs assistance Sitting-balance support: Feet supported, No upper extremity supported Sitting balance-Leahy Scale: Good     Standing balance support: Reliant on assistive device for balance, Bilateral upper extremity supported Standing balance-Leahy Scale: Poor Standing balance comment: Heavily reliant on RW during ambulation, although no LOBs occured  Communication Communication Communication: No apparent difficulties  Cognition Arousal: Alert Behavior During Therapy: WFL for tasks assessed/performed   PT - Cognitive impairments: No apparent impairments                       PT - Cognition Comments: AxOx4 Following commands: Intact      Cueing  Cueing Techniques: Verbal cues  Exercises      General Comments        Pertinent Vitals/Pain Pain Assessment Pain Assessment: No/denies pain    Home Living                          Prior Function            PT Goals (current goals can now be found in the care plan section) Progress towards PT goals: Progressing toward goals    Frequency    Min 2X/week      PT Plan      Co-evaluation              AM-PAC PT 6 Clicks Mobility   Outcome Measure  Help needed turning from your back to your side while in a flat bed without using bedrails?: A Little Help needed moving from lying on your back to sitting on the side of a flat bed without using bedrails?: A Little Help needed moving to and from a bed to a chair (including a wheelchair)?: A Little Help needed standing up from a chair using your arms (e.g., wheelchair or bedside chair)?: A Little Help needed to walk in hospital room?: A Little Help needed climbing 3-5 steps with a railing? : A Lot 6 Click Score: 17    End of Session Equipment Utilized During Treatment: Gait belt Activity Tolerance: Patient tolerated treatment well Patient left: in chair;with call bell/phone within reach;with chair alarm set;with nursing/sitter in room Nurse Communication: Mobility status PT Visit Diagnosis: Other abnormalities of gait and mobility (R26.89);Muscle weakness (generalized) (M62.81);Repeated falls (R29.6);History of falling (Z91.81);Unsteadiness on feet (R26.81)     Time: 8550-8479 PT Time Calculation (min) (ACUTE ONLY): 31 min  Charges:                            Leontine Ingles, SPT 02/10/24, 4:43 PM

## 2024-02-10 NOTE — Plan of Care (Signed)
  Problem: Health Behavior/Discharge Planning: Goal: Ability to manage health-related needs will improve Outcome: Progressing   Problem: Coping: Goal: Level of anxiety will decrease Outcome: Progressing   Problem: Safety: Goal: Ability to remain free from injury will improve Outcome: Progressing   Problem: Skin Integrity: Goal: Risk for impaired skin integrity will decrease Outcome: Progressing   

## 2024-02-10 NOTE — Consult Note (Signed)
 WOC Nurse Consult Note: Reason for Consult: pressure injuries Patient found after fall in the home, down for 24 hours.  Wound type: Right ischial tuberosity; Deep Tissue Pressure Injury Left ischial tuberosity; Deep Tissue Pressure Injury Nursing flow sheet indicates Stage 2 on the coccyx, verifying with bedside nurse Pressure Injury POA: Yes Measurement: Wound bed: Drainage (amount, consistency, odor)  Periwound: Dressing procedure/placement/frequency: Cleanse bilateral ischial tuberosities and coccyx with saline, pat dry. Cover each wound with single layer of xeroform, top with foam. Change every other day. Lift to inspect for acute changes daily.  Chair pressure re-distribution pad for use when in the recliner and to be sent with patient at DC for use Consult RD for wound supplementation  Turn and reposition per hospital protocol.    Re consult if needed, will not follow at this time. Thanks  Laticha Ferrucci [employer], RN,CWOCN, CNS, CWON-AP [phone])

## 2024-02-11 DIAGNOSIS — T796XXD Traumatic ischemia of muscle, subsequent encounter: Secondary | ICD-10-CM

## 2024-02-11 DIAGNOSIS — N179 Acute kidney failure, unspecified: Secondary | ICD-10-CM | POA: Diagnosis not present

## 2024-02-11 DIAGNOSIS — T796XXS Traumatic ischemia of muscle, sequela: Secondary | ICD-10-CM | POA: Diagnosis not present

## 2024-02-11 DIAGNOSIS — R296 Repeated falls: Secondary | ICD-10-CM | POA: Diagnosis not present

## 2024-02-11 DIAGNOSIS — N3000 Acute cystitis without hematuria: Secondary | ICD-10-CM | POA: Diagnosis not present

## 2024-02-11 LAB — CBC
HCT: 38.3 % (ref 36.0–46.0)
Hemoglobin: 12.4 g/dL (ref 12.0–15.0)
MCH: 29.5 pg (ref 26.0–34.0)
MCHC: 32.4 g/dL (ref 30.0–36.0)
MCV: 91.2 fL (ref 80.0–100.0)
Platelets: 146 10*3/uL — ABNORMAL LOW (ref 150–400)
RBC: 4.2 MIL/uL (ref 3.87–5.11)
RDW: 13.8 % (ref 11.5–15.5)
WBC: 3.9 10*3/uL — ABNORMAL LOW (ref 4.0–10.5)
nRBC: 0 % (ref 0.0–0.2)

## 2024-02-11 LAB — BASIC METABOLIC PANEL WITH GFR
Anion gap: 7 (ref 5–15)
BUN: 10 mg/dL (ref 8–23)
CO2: 28 mmol/L (ref 22–32)
Calcium: 8.3 mg/dL — ABNORMAL LOW (ref 8.9–10.3)
Chloride: 103 mmol/L (ref 98–111)
Creatinine, Ser: 0.78 mg/dL (ref 0.44–1.00)
GFR, Estimated: >60 mL/min (ref >60)
Glucose, Bld: 80 mg/dL (ref 70–99)
Potassium: 3.7 mmol/L (ref 3.5–5.1)
Sodium: 138 mmol/L (ref 135–145)
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
## 2024-02-11 NOTE — Plan of Care (Signed)
  Problem: Health Behavior/Discharge Planning: Goal: Ability to manage health-related needs will improve Outcome: Progressing   Problem: Nutrition: Goal: Adequate nutrition will be maintained Outcome: Progressing   Problem: Pain Managment: Goal: General experience of comfort will improve and/or be controlled Outcome: Progressing   Problem: Safety: Goal: Ability to remain free from injury will improve Outcome: Progressing

## 2024-02-11 NOTE — Care Management Important Message (Signed)
 Important Message  Patient Details  Name: [name] MRN: [id number] Date of Birth: February 09, 1941   Important Message Given:  Yes - Medicare IM     Abdullahi Vallone W, CMA 02/11/2024, 12:33 PM

## 2024-02-11 NOTE — NC FL2 (Signed)
 Index  MEDICAID FL2 LEVEL OF CARE FORM     IDENTIFICATION  Patient Name: [name] Birthdate: 04/09/1941 Sex: female Admission Date (Current Location): 02/07/2024  [facility] and [place] Number:  Chiropodist and Address:  [facility], [address], [place], [place] [address]      Provider Number: [id number]  Attending Physician Name and Address:  Josette Ade, MD  Relative Name and Phone Number:  Landry Moles [phone]    Current Level of Care: Hospital Recommended Level of Care: Skilled Nursing Facility Prior Approval Number:    Date Approved/Denied:   PASRR Number: [id number]  Discharge Plan: SNF    Current Diagnoses: Patient Active Problem List   Diagnosis Date Noted   Protein-calorie malnutrition, severe 02/10/2024   Acute cystitis without hematuria 02/09/2024   Memory loss 02/08/2024   Pressure injury of skin 02/08/2024   Rhabdomyolysis 02/07/2024   On chronic immunosuppressive therapy 02/07/2024   Multiple falls 02/07/2024   AKI (acute kidney injury) (HCC) 02/07/2024   Cellulitis and abscess of toe 04/16/2022   Callus 02/08/2022   Ulcer of foot, unspecified laterality, limited to breakdown of skin (HCC) 02/08/2022   Paronychia of great toe of right foot 12/07/2021   Hammer toes, bilateral 07/04/2020   Hav (hallux abducto valgus), unspecified laterality 07/04/2020   Right anterior knee pain 05/01/2018   Primary osteoarthritis of both hips 01/31/2018   Swelling of right knee joint 10/01/2017   Encounter for long-term (current) use of high-risk medication 02/28/2017   Rheumatoid arthritis involving multiple sites (HCC) 02/28/2017   Unintentional weight loss 02/28/2017   Bilateral hand pain 02/14/2017   Acquired hypothyroidism 01/21/2017   Osteoarthritis of both knees 01/21/2017   Iron deficiency anemia 11/30/2016   Aftercare following joint replacement surgery 09/03/2015   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   Trochanteric bursitis of left hip 06/13/2015   Hyperlipidemia, unspecified 05/03/2015   Presence of artificial hip joint 07/04/2013    Orientation RESPIRATION BLADDER Height & Weight     Self, Time, Situation    Continent Weight: 48.5 kg Height:  5' 6 (167.6 cm)  BEHAVIORAL SYMPTOMS/MOOD NEUROLOGICAL BOWEL NUTRITION STATUS      Continent Diet (Dysphagia 3)  AMBULATORY STATUS COMMUNICATION OF NEEDS Skin   Limited Assist Verbally                         Personal Care Assistance Level of Assistance  Bathing, Feeding, Dressing Bathing Assistance: Limited assistance Feeding assistance: Limited assistance Dressing Assistance: Limited assistance     Functional Limitations Info             SPECIAL CARE FACTORS FREQUENCY                       Contractures      Additional Factors Info  Allergies, Code Status Code Status Info: FULL Allergies Info: NKDA           Current Medications (02/11/2024):  This is the current hospital active medication list Current Facility-Administered Medications  Medication Dose Route Frequency Provider Last Rate Last Admin   acetaminophen  (TYLENOL ) tablet 650 mg  650 mg Oral Q6H PRN Duncan, Hazel V, MD       Or   acetaminophen  (TYLENOL ) suppository 650 mg  650 mg Rectal Q6H PRN Cleatus Delayne GAILS, MD       ascorbic acid (VITAMIN C) tablet 500 mg  500 mg Oral  BID Josette Ade, MD   500 mg at 02/10/24 2118   aspirin  EC tablet 81 mg  81 mg Oral Daily Josette Ade, MD   81 mg at 02/10/24 0843   cephALEXin (KEFLEX) capsule 500 mg  500 mg Oral Q12H Wieting, Richard, MD       cyanocobalamin  (VITAMIN B12) tablet 1,000 mcg  1,000 mcg Oral Daily Josette Ade, MD   1,000 mcg at 02/10/24 0843   enoxaparin  (LOVENOX ) injection 40 mg  40 mg Subcutaneous Q24H Hallaji, Sheema M, RPH   40 mg at 02/10/24 2117   feeding supplement (ENSURE PLUS HIGH PROTEIN) liquid 237 mL  237 mL Oral TID BM Josette Ade, MD   237 mL  at 02/10/24 2058   ferrous sulfate  tablet 325 mg  325 mg Oral Daily Josette Ade, MD   325 mg at 02/10/24 9156   folic acid  (FOLVITE ) tablet 1 mg  1 mg Oral Daily Josette Ade, MD   1 mg at 02/10/24 9156   HYDROcodone -acetaminophen  (NORCO/VICODIN) 5-325 MG per tablet 1-2 tablet  1-2 tablet Oral Q4H PRN Duncan, Hazel V, MD       levothyroxine (SYNTHROID) tablet 88 mcg  88 mcg Oral Q0600 Josette Ade, MD   88 mcg at 02/11/24 9352   multivitamin with minerals tablet 1 tablet  1 tablet Per Tube Daily Josette Ade, MD   1 tablet at 02/10/24 1548   ondansetron  (ZOFRAN ) tablet 4 mg  4 mg Oral Q6H PRN Duncan, Hazel V, MD       Or   ondansetron  (ZOFRAN ) injection 4 mg  4 mg Intravenous Q6H PRN Duncan, Hazel V, MD       zinc sulfate (50mg  elemental zinc) capsule 220 mg  220 mg Oral Daily Josette Ade, MD   220 mg at 02/10/24 1545     Discharge Medications: Please see discharge summary for a list of discharge medications.  Relevant Imaging Results:  Relevant Lab Results:   Additional Information SSN: [id number]  Dalia GORMAN Fuse, RN

## 2024-02-11 NOTE — Plan of Care (Signed)
  Problem: Education: Goal: Knowledge of General Education information will improve Description: Including pain rating scale, medication(s)/side effects and non-pharmacologic comfort measures Outcome: Progressing   Problem: Health Behavior/Discharge Planning: Goal: Ability to manage health-related needs will improve Outcome: Progressing   Problem: Clinical Measurements: Goal: Ability to maintain clinical measurements within normal limits will improve Outcome: Progressing Goal: Will remain free from infection Outcome: Progressing Goal: Diagnostic test results will improve Outcome: Progressing Goal: Respiratory complications will improve Outcome: Progressing Goal: Cardiovascular complication will be avoided Outcome: Progressing   Problem: Activity: Goal: Risk for activity intolerance will decrease Outcome: Progressing   Problem: Nutrition: Goal: Adequate nutrition will be maintained Outcome: Progressing   Problem: Pain Managment: Goal: General experience of comfort will improve and/or be controlled Outcome: Progressing   Problem: Skin Integrity: Goal: Risk for impaired skin integrity will decrease Outcome: Progressing

## 2024-02-11 NOTE — Progress Notes (Signed)
 Occupational Therapy Treatment Patient Details Name: [name] MRN: [id number] DOB: April 29, 1941 Today's Date: 02/11/2024   History of present illness [name] is a 83 y.o. female with medical history significant for Hypothyroidism, rheumatoid arthritis on chronic prednisone and Renflexis  infusions, followed by rheumatology, osteoarthritis, PCP documentation of several falls requiring ED visits, and who currently uses a walker, who is being admitted for rhabdomyolysis after she was found down by a neighbor.  She was on the floor for over 24 hours.   OT comments  [name] was seen for OT treatment on this date. Upon arrival to room pt recently returned to bed, reports she already walked this AM and agreeable to minimal mobility only. Pt requires no assist for bed mobility. MIN A + RW sit<>stand from bed x3 trials, poor eccentric control despite step by step cueing - pt is high fall risk when sitting. Pt completed the SLUMS, a 30-point screening questionnaire that tests orientation, memory, attention, problem solving, and executive function. Pt scored an 11/30 indicating a positive screen for dementia and further testing is recommended. Pt making good progress toward goals, will continue to follow POC. Discharge recommendation remains appropriate.   [facility] Mental Status Examination Orientation: 3/3  Calculations: 1/3 Naming animals: 1/3 Patient named 6 animals (0 points is 0-4 animals; 1 is 5-9 animals; 2 is 10-14 animals; 3 is 15+ animals) Recall: 0/5  Attention: 2/2 Clock drawing: 0/4 Visual Processing: 2/2 Paragraph Memory: 2/8  Total: 11/30;  Given that patient has high school level of education, this score falls in the dementia range.        If plan is discharge home, recommend the following:  A little help with walking and/or transfers;A little help with bathing/dressing/bathroom;Direct supervision/assist for financial management;Direct supervision/assist for  medications management;Assist for transportation   Equipment Recommendations  Other (comment) (defer)    Recommendations for Other Services      Precautions / Restrictions Precautions Precautions: Fall Recall of Precautions/Restrictions: Impaired Restrictions Weight Bearing Restrictions Per Provider Order: No       Mobility Bed Mobility Overal bed mobility: Modified Independent                  Transfers Overall transfer level: Needs assistance Equipment used: Rolling walker (2 wheels) Transfers: Sit to/from Stand Sit to Stand: Min assist           General transfer comment: poor eccentric control     Balance Overall balance assessment: Needs assistance Sitting-balance support: Feet supported, No upper extremity supported Sitting balance-Leahy Scale: Good     Standing balance support: Bilateral upper extremity supported Standing balance-Leahy Scale: Fair                             ADL either performed or assessed with clinical judgement   ADL Overall ADL's : Needs assistance/impaired                                       General ADL Comments: MIN A + RW for simulated BSC t/f    Extremity/Trunk Assessment              Vision       Perception     Praxis     Communication Communication Communication: No apparent difficulties   Cognition Arousal: Alert Behavior During Therapy: WFL for tasks assessed/performed  Cognition: No family/caregiver present to determine baseline             OT - Cognition Comments: SLUMS 11/30                 Following commands: Intact        Cueing   Cueing Techniques: Verbal cues, Gestural cues  Exercises      Shoulder Instructions       General Comments      Pertinent Vitals/ Pain       Pain Assessment Pain Assessment: No/denies pain  Home Living                                          Prior Functioning/Environment               Frequency  Min 2X/week        Progress Toward Goals  OT Goals(current goals can now be found in the care plan section)  Progress towards OT goals: Progressing toward goals  Acute Rehab OT Goals OT Goal Formulation: With patient Time For Goal Achievement: 02/22/24 Potential to Achieve Goals: Good ADL Goals Pt Will Perform Grooming: with modified independence;standing Pt Will Perform Lower Body Dressing: with modified independence;sitting/lateral leans Pt Will Transfer to Toilet: with modified independence;ambulating Pt Will Perform Toileting - Clothing Manipulation and hygiene: with modified independence;sit to/from stand  Plan      Co-evaluation                 AM-PAC OT 6 Clicks Daily Activity     Outcome Measure   Help from another person eating meals?: None Help from another person taking care of personal grooming?: A Little Help from another person toileting, which includes using toliet, bedpan, or urinal?: A Little Help from another person bathing (including washing, rinsing, drying)?: A Little Help from another person to put on and taking off regular upper body clothing?: A Little Help from another person to put on and taking off regular lower body clothing?: A Little 6 Click Score: 19    End of Session Equipment Utilized During Treatment: Rolling walker (2 wheels)  OT Visit Diagnosis: Unsteadiness on feet (R26.81);Other abnormalities of gait and mobility (R26.89);Repeated falls (R29.6);Muscle weakness (generalized) (M62.81)   Activity Tolerance Patient tolerated treatment well   Patient Left in bed;with call bell/phone within reach;with bed alarm set   Nurse Communication Mobility status        Time: 8672-8644 OT Time Calculation (min): 28 min  Charges: OT General Charges $OT Visit: 1 Visit OT Treatments $Self Care/Home Management : 8-22 mins $Therapeutic Activity: 8-22 mins  Elston Slot, M.S. OTR/L  02/11/24, 2:05 PM  ascom  [phone]

## 2024-02-11 NOTE — Progress Notes (Signed)
 Progress Note   Patient: [name] FMW:[id number] DOB: [birth year] DOA: 02/07/2024     3 DOS: the patient was seen and examined on 02/11/2024   Brief hospital course: 83 y.o. female with medical history significant for Hypothyroidism, rheumatoid arthritis on chronic prednisone and Renflexis  infusions, followed by rheumatology, osteoarthritis, PCP documentation of several falls requiring ED visits, and who currently uses a walker, who is being admitted for rhabdomyolysis after she was found down by a neighbor.  She was on the floor for over 24 hours. In the ED she had a low-grade temperature of 99.1 with otherwise normal vitals.  Labs showed normal CBC, Creatinine of 1.85 up from baseline of 1.1 to with bicarb of 19.  Mild LFT elevation with AST 43 and total bilirubin 2.7.  Troponin 17.  Urinalysis with many bacteria and small leuks.  EKG showed sinus arrhythmia at 88 CT head and C-spine without acute injury.  Chest x-ray nonacute showing multiple degenerative changes x-ray of the knee showing moderate right knee effusion among other findings-please see report. Patient treated with 2 L NS bolus and pain medicine.  Admission requested.   6/21.  CK down to 861 and creatinine down to 1.36.  Continue IV fluids.  PT evaluation.  Patient does not have any recollection of how she got to the hospital. 6/22.  CK down to 513 and creatinine down to 1.0.  Appreciate psychiatric consultation. 6/23.  TOC looking into rehabs.  Urine culture came back positive and started on Rocephin 6/24.  Awaiting rehab.  Rocephin changed over to Keflex.  Assessment and Plan: * Rhabdomyolysis CK improved from 1195 down to 513.  Not checking CK any further.  AKI (acute kidney injury) (HCC) Creatinine improved from 1.85 down to 0.78  Acute cystitis without hematuria E. coli and Klebsiella growing out of urine culture.  Rocephin changed over to Keflex.  Multiple falls Lives by herself.  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  Spoke with TOC to set up an APS report.  Patient agreeable to rehab.  Rheumatoid arthritis involving multiple sites [facility]) On chronic immunosuppressive therapy Patient takes methotrexate and folic acid .  Patient states she does not take prednisone on a daily basis.  Will hold for right now.  Acquired hypothyroidism Continue levothyroxine at slightly increased dose with TSH being slightly elevated.  Protein-calorie malnutrition, severe Continue supplements  Pressure injury of skin Wound care nurse reading as deep tissue pressure injury bilateral buttock areas.  Memory loss I am concerned that she was unable to tell me any history of how she got to the hospital.  Patient's niece tells me that she repeatedly and asked the same question.  Concern for underlying dementia.  0 out of 3 on short-term recall.  TSH slightly elevated, RPR nonreactive, HIV nonreactive, vitamin B12 normal range.  Had psychiatry team see and she does have capacity to make decision to go home versus rehab.       Subjective: Patient feels okay offers no complaints.  Beth her cousin has mentioned that the patient thinks that she is already in a rehab.  Admitted with acute kidney injury and rhabdomyolysis.  Physical Exam: Vitals:   02/10/24 1559 02/10/24 1936 02/11/24 0514 02/11/24 0808  BP: (!) 140/71 (!) 153/76 126/68 132/67  Pulse: 73 84 80 75  Resp: 15 20  18   Temp: 98.1 F (36.7 C)  98 F (36.7 C) 98.1 F (36.7 C)  TempSrc:  SpO2: 100% 100% 99% 100%  Weight:      Height:       Physical Exam HENT:     Head: Normocephalic.   Eyes:     General: Lids are normal.     Conjunctiva/sclera: Conjunctivae normal.    Cardiovascular:     Rate and Rhythm: Normal rate and regular rhythm.     Heart sounds: Normal heart sounds, S1 normal and S2 normal.  Pulmonary:     Breath sounds: No decreased breath sounds, wheezing, rhonchi or rales.  Abdominal:      Palpations: Abdomen is soft.     Tenderness: There is no abdominal tenderness.   Musculoskeletal:     Right lower leg: No swelling.     Left lower leg: No swelling.   Skin:    General: Skin is warm.     Findings: No rash.   Neurological:     Mental Status: She is alert.     Data Reviewed: Urine culture growing E. coli and Klebsiella Creatinine 0.78, white blood count 3.9, hemoglobin 12.4, platelet 146  Family Communication: Spoke with cousin Beth on the phone  Disposition: Status is: Inpatient Stable for discharge to rehab once we have insurance authorization  Planned Discharge Destination: Rehab    Time spent: 28 minutes  Author: Charlie Patterson, MD 02/11/2024 2:55 PM  For on call review [email or web address].

## 2024-02-11 NOTE — Progress Notes (Signed)
 Physical Therapy Treatment Patient Details Name: EDRIS SCHNECK MRN: [id number] DOB: 16-Mar-1941 Today's Date: 02/11/2024   History of Present Illness Mabelle L Lambing is a 83 y.o. female with medical history significant for Hypothyroidism, rheumatoid arthritis on chronic prednisone and Renflexis  infusions, followed by rheumatology, osteoarthritis, PCP documentation of several falls requiring ED visits, and who currently uses a walker, who is being admitted for rhabdomyolysis after she was found down by a neighbor.  She was on the floor for over 24 hours.    PT Comments  Pt was pleasant and motivated to participate during the session and put forth good effort throughout. Pt required CGA for STS, but multiple rocking attempts from EOB with RW and min assist for STS from standard height toilet with RW. Pt ambulated 240 feet with RW with CGA, but was heavily reliant on RW to maintain balance. VC's were used to encourage upright posture during gait with minimal pt carryover. Pt reported no adverse symptoms during the session with SpO2 and HR WNL throughout on room air. With the pt being heavily reliant on RW to maintain balance, demonstrating minimal step length bilaterally during ambulation, and having a previous recent history of falls, the pt is at an increased risk of falls in the future. Pt will benefit from continued PT services upon discharge to safely address deficits listed in patient problem list for decreased caregiver assistance and eventual return to PLOF.     If plan is discharge home, recommend the following: Assistance with cooking/housework;A little help with bathing/dressing/bathroom;A little help with walking and/or transfers;Supervision due to cognitive status;Assist for transportation;Help with stairs or ramp for entrance   Can travel by private vehicle     Yes  Equipment Recommendations  BSC/3in1    Recommendations for Other Services       Precautions / Restrictions  Precautions Precautions: Fall Recall of Precautions/Restrictions: Impaired Restrictions Weight Bearing Restrictions Per Provider Order: No     Mobility  Bed Mobility Overal bed mobility: Modified Independent Bed Mobility: Supine to Sit     Supine to sit: Modified independent (Device/Increase time)     General bed mobility comments: Pt Mod I with coming to sitting EOB from supine using bedrails and requiring increased time. No physical assistance was required    Transfers Overall transfer level: Needs assistance Equipment used: Rolling walker (2 wheels) Transfers: Sit to/from Stand Sit to Stand: Contact guard assist, Min assist           General transfer comment: Pt required CGA for STS from EOB with RW, but required min assist for STS from standard height toilet    Ambulation/Gait Ambulation/Gait assistance: Contact guard assist Gait Distance (Feet): 240 Feet Assistive device: Rolling walker (2 wheels) Gait Pattern/deviations: Step-to pattern, Step-through pattern, Decreased step length - right, Decreased step length - left, Trunk flexed, Shuffle Gait velocity: decreased     General Gait Details: Pt required CGA for ambulation with RW and demonstrated minimal step length and flexed trunk over RW. VC's were utilized to encourage upright posture with minimal pt carryover. Pt remained steady and no LOBs occured. Pt's SPO2% and HR remained WNL throughout ambulation.   Stairs             Wheelchair Mobility     Tilt Bed    Modified Rankin (Stroke Patients Only)       Balance Overall balance assessment: Needs assistance Sitting-balance support: Feet supported, No upper extremity supported Sitting balance-Leahy Scale: Good     Standing balance  support: Reliant on assistive device for balance, Bilateral upper extremity supported Standing balance-Leahy Scale: Poor Standing balance comment: Heavily reliant on RW during ambulation, flexed trunk over RW during  ambulation, but no LOBs occured                            Communication Communication Communication: No apparent difficulties  Cognition Arousal: Alert Behavior During Therapy: WFL for tasks assessed/performed   PT - Cognitive impairments: No apparent impairments                       PT - Cognition Comments: AxOx4 Following commands: Intact      Cueing Cueing Techniques: Verbal cues, Gestural cues  Exercises      General Comments        Pertinent Vitals/Pain Pain Assessment Pain Assessment: No/denies pain    Home Living                          Prior Function            PT Goals (current goals can now be found in the care plan section) Progress towards PT goals: Progressing toward goals    Frequency    Min 2X/week      PT Plan      Co-evaluation              AM-PAC PT 6 Clicks Mobility   Outcome Measure  Help needed turning from your back to your side while in a flat bed without using bedrails?: A Little Help needed moving from lying on your back to sitting on the side of a flat bed without using bedrails?: A Little Help needed moving to and from a bed to a chair (including a wheelchair)?: A Little Help needed standing up from a chair using your arms (e.g., wheelchair or bedside chair)?: A Little Help needed to walk in hospital room?: A Little Help needed climbing 3-5 steps with a railing? : A Lot 6 Click Score: 17    End of Session Equipment Utilized During Treatment: Gait belt Activity Tolerance: Patient tolerated treatment well Patient left: in chair;with call bell/phone within reach;with chair alarm set Nurse Communication: Mobility status PT Visit Diagnosis: Other abnormalities of gait and mobility (R26.89);Muscle weakness (generalized) (M62.81);Repeated falls (R29.6);History of falling (Z91.81);Unsteadiness on feet (R26.81)     Time: 8978-8940 PT Time Calculation (min) (ACUTE ONLY): 38 min  Charges:                             Leontine Ingles, SPT 02/11/24, 1:50 PM

## 2024-02-12 DIAGNOSIS — L89899 Pressure ulcer of other site, unspecified stage: Secondary | ICD-10-CM

## 2024-02-12 DIAGNOSIS — R296 Repeated falls: Secondary | ICD-10-CM | POA: Diagnosis not present

## 2024-02-12 DIAGNOSIS — M069 Rheumatoid arthritis, unspecified: Secondary | ICD-10-CM | POA: Diagnosis not present

## 2024-02-12 DIAGNOSIS — N179 Acute kidney failure, unspecified: Secondary | ICD-10-CM | POA: Diagnosis not present

## 2024-02-12 DIAGNOSIS — E43 Unspecified severe protein-calorie malnutrition: Secondary | ICD-10-CM | POA: Diagnosis not present

## 2024-02-12 MED ORDER — ADULT MULTIVITAMIN W/MINERALS CH
1.0000 | ORAL_TABLET | Freq: Every day | ORAL | Status: DC
  Administered 2024-02-13 – 2024-03-11 (×28): 1 via ORAL
  Filled 2024-02-12 (×28): qty 1

## 2024-02-12 NOTE — Progress Notes (Signed)
 PROGRESS NOTE    [name]  FMW:[id number] DOB: 1941-06-19 DOA: 02/07/2024 PCP: Glover Lenis, MD  Chief Complaint  Patient presents with   [facility] Course:  83 y.o. female with medical history significant for Hypothyroidism, rheumatoid arthritis on chronic prednisone and Renflexis  infusions, followed by rheumatology, osteoarthritis, PCP documentation of several falls requiring ED visits, and who currently uses a walker, who is being admitted for rhabdomyolysis after she was found down by a neighbor.  She was on the floor for over 24 hours. In the ED she had a low-grade temperature of 99.1 with otherwise normal vitals.  Labs showed normal CBC, Creatinine of 1.85 up from baseline of 1.1 to with bicarb of 19.  Mild LFT elevation with AST 43 and total bilirubin 2.7.  Troponin 17.  Urinalysis with many bacteria and small leuks.  EKG showed sinus arrhythmia at 88 CT head and C-spine without acute injury.  Chest x-ray nonacute showing multiple degenerative changes x-ray of the knee showing moderate right knee effusion among other findings-please see report. Patient treated with 2 L NS bolus and pain medicine.  Admission requested.   6/21.  CK down to 861 and creatinine down to 1.36.  Continue IV fluids.  PT evaluation.  Patient does not have any recollection of how she got to the hospital. 6/22.  CK down to 513 and creatinine down to 1.0.  Appreciate psychiatric consultation. 6/23.  TOC looking into rehabs.  Urine culture came back positive and started on Rocephin 6/24.  Awaiting rehab.  Rocephin changed over to Keflex.  Subjective: Has no acute complaints.  Denies any events overnight.   Objective: Vitals:   02/11/24 2107 02/12/24 0445 02/12/24 0759 02/12/24 1626  BP: 134/63 124/61 117/63 125/60  Pulse: 82 79 67 87  Resp: 18 16 16 16   Temp: 99 F (37.2 C) 98.5 F (36.9 C) 98.1 F (36.7 C) 99 F (37.2 C)  TempSrc: Oral Oral    SpO2: 95% 94% 97% 99%  Weight:       Height:       No intake or output data in the 24 hours ending 02/12/24 1746 Filed Weights   02/07/24 1842  Weight: 48.5 kg    Examination: General exam: Appears calm and comfortable, NAD, frail appearing Respiratory system: No work of breathing, symmetric chest wall expansion Cardiovascular system: S1 & S2 heard, RRR.  Gastrointestinal system: Abdomen is nondistended, soft and nontender.  Neuro: Alert and oriented. No focal neurological deficits. Extremities: Symmetric, expected ROM Skin: No rashes, lesions Psychiatry: Demonstrates appropriate judgement and insight. Mood & affect appropriate for situation.   Assessment & Plan:  Principal Problem:   Rhabdomyolysis Active Problems:   AKI (acute kidney injury) (HCC)   On chronic immunosuppressive therapy   Multiple falls   Acute cystitis without hematuria   Acquired hypothyroidism   Rheumatoid arthritis involving multiple sites (HCC)   Memory loss   Pressure injury of skin   Protein-calorie malnutrition, severe    Rhabdomyolysis - CK peaked at 1195, has downtrended now - No further CK trend  AKI, resolved - Initial creatinine 1.85, has resolved to baseline - Renally dosed with creatinine clearance of 41 when needed  Acute cystitis without hematuria - Urine culture with E. coli and Klebsiella, sensitive to Keflex.  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  TOC working on APS  report. - Patient to discharge to rehab when this is arranged  Rheumatoid arthritis of multiple sites On chronic immunosuppressive therapy - On methotrexate and folic acid  - Patient denies taking prednisone on a daily basis.  Will hold for now  Acquired hypothyroidism - Continue levothyroxine at current dose  Protein calorie malnutrition, severe - Continue supplements  Pressure injury of skin - Wound nurse  consulted  Memory loss - Patient is demonstrating difficulty with short-term recall, also cannot report the events prior to this hospitalization - Workup revealed slightly elevated TSH, RPR nonreactive, HIV nonreactive, B12 within normal limits - Psychiatry has seen and reports she does have capacity to make decisions at this time. - Would benefit from further outpatient workup for neuropsych testing   DVT prophylaxis: Lovenox    Code Status: Full Code Disposition: Inpatient pending rehab placement.  Medically cleared for discharge when this is arranged  Consultants:    Procedures:    Antimicrobials:  Anti-infectives (From admission, onward)    Start     Dose/Rate Route Frequency Ordered Stop   02/11/24 1000  cephALEXin (KEFLEX) capsule 500 mg        500 mg Oral Every 12 hours 02/10/24 1435 02/14/24 0959   02/09/24 1330  cefTRIAXone (ROCEPHIN) 1 g in sodium chloride  0.9 % 100 mL IVPB  Status:  Discontinued        1 g 200 mL/hr over 30 Minutes Intravenous Every 24 hours 02/09/24 1239 02/10/24 1435       Data Reviewed: I have personally reviewed following labs and imaging studies CBC: Recent Labs  Lab 02/07/24 1845 02/08/24 0605 02/11/24 0421  WBC 4.2 4.3 3.9*  HGB 12.4 10.7* 12.4  HCT 37.7 33.6* 38.3  MCV 92.9 92.3 91.2  PLT 162 146* 146*   Basic Metabolic Panel: Recent Labs  Lab 02/07/24 1845 02/08/24 0605 02/09/24 0134 02/11/24 0421  NA 141 137 139 138  K 4.1 3.6 3.4* 3.7  CL 107 107 110 103  CO2 19* 22 24 28   GLUCOSE 147* 84 75 80  BUN 47* 38* 32* 10  CREATININE 1.85* 1.36* 1.00 0.78  CALCIUM 8.6* 7.3* 7.7* 8.3*   GFR: Estimated Creatinine Clearance: 41.5 mL/min (by C-G formula based on SCr of 0.78 mg/dL). Liver Function Tests: Recent Labs  Lab 02/07/24 1845 02/08/24 0605  AST 43* 31  ALT 14 12  ALKPHOS 62 48  BILITOT 2.3* 1.4*  PROT 7.2 5.8*  ALBUMIN 3.3* 2.7*   CBG: No results for input(s): GLUCAP in the last 168 hours.  Recent  Results (from the past 240 hours)  Urine Culture     Status: Abnormal   Collection Time: 02/07/24  8:36 PM   Specimen: Urine, Random  Result Value Ref Range Status   Specimen Description   Final    URINE, RANDOM Performed at [facility], [address] [place], [place] [address]    Special Requests   Final    NONE Reflexed from [id number] Performed at [facility], [address] [place], [place] [address]    Culture (A)  Final    >=100,000 COLONIES/mL ESCHERICHIA COLI 50,000 COLONIES/mL KLEBSIELLA PNEUMONIAE    Report Status 02/10/2024 FINAL  Final   Organism ID, Bacteria ESCHERICHIA COLI (A)  Final   Organism ID, Bacteria KLEBSIELLA PNEUMONIAE (A)  Final      Susceptibility   Escherichia coli - MIC*    AMPICILLIN 8 SENSITIVE Sensitive     CEFAZOLIN  <=4 SENSITIVE Sensitive  CEFEPIME <=0.12 SENSITIVE Sensitive     CEFTRIAXONE <=0.25 SENSITIVE Sensitive     CIPROFLOXACIN <=0.25 SENSITIVE Sensitive     GENTAMICIN <=1 SENSITIVE Sensitive     IMIPENEM <=0.25 SENSITIVE Sensitive     NITROFURANTOIN <=16 SENSITIVE Sensitive     TRIMETH/SULFA <=20 SENSITIVE Sensitive     AMPICILLIN/SULBACTAM 4 SENSITIVE Sensitive     PIP/TAZO <=4 SENSITIVE Sensitive ug/mL    * >=100,000 COLONIES/mL ESCHERICHIA COLI   Klebsiella pneumoniae - MIC*    AMPICILLIN >=32 RESISTANT Resistant     CEFAZOLIN  <=4 SENSITIVE Sensitive     CEFEPIME <=0.12 SENSITIVE Sensitive     CEFTRIAXONE <=0.25 SENSITIVE Sensitive     CIPROFLOXACIN <=0.25 SENSITIVE Sensitive     GENTAMICIN <=1 SENSITIVE Sensitive     IMIPENEM <=0.25 SENSITIVE Sensitive     NITROFURANTOIN 128 RESISTANT Resistant     TRIMETH/SULFA <=20 SENSITIVE Sensitive     AMPICILLIN/SULBACTAM 4 SENSITIVE Sensitive     PIP/TAZO <=4 SENSITIVE Sensitive ug/mL    * 50,000 COLONIES/mL KLEBSIELLA PNEUMONIAE     Radiology Studies: No results found.  Scheduled Meds:  vitamin C  500 mg Oral BID   aspirin  EC  81 mg Oral Daily    cephALEXin  500 mg Oral Q12H   cyanocobalamin   1,000 mcg Oral Daily   enoxaparin  (LOVENOX ) injection  40 mg Subcutaneous Q24H   feeding supplement  237 mL Oral TID BM   ferrous sulfate   325 mg Oral Daily   folic acid   1 mg Oral Daily   levothyroxine  88 mcg Oral Q0600   [START ON 02/13/2024] multivitamin with minerals  1 tablet Oral Daily   zinc sulfate (50mg  elemental zinc)  220 mg Oral Daily   Continuous Infusions:   LOS: 4 days  MDM: Patient is high risk for one or more organ failure.  They necessitate ongoing hospitalization for continued IV therapies and subsequent lab monitoring. Total time spent interpreting labs and vitals, reviewing the medical record, coordinating care amongst consultants and care team members, directly assessing and discussing care with the patient and/or family: 55 min  Alonzo Loving, DO [facility]  To contact the attending physician between 7A-7P please use Epic Chat. To contact the covering physician during after hours 7P-7A, please review Amion.  02/12/2024, 5:46 PM   *This document has been created with the assistance of dictation software. Please excuse typographical errors. *

## 2024-02-12 NOTE — Plan of Care (Signed)
  Problem: Education: Goal: Knowledge of General Education information will improve Description: Including pain rating scale, medication(s)/side effects and non-pharmacologic comfort measures Outcome: Progressing   Problem: Health Behavior/Discharge Planning: Goal: Ability to manage health-related needs will improve Outcome: Progressing   Problem: Clinical Measurements: Goal: Ability to maintain clinical measurements within normal limits will improve Outcome: Progressing Goal: Will remain free from infection Outcome: Progressing Goal: Diagnostic test results will improve Outcome: Progressing Goal: Respiratory complications will improve Outcome: Progressing Goal: Cardiovascular complication will be avoided Outcome: Progressing   Problem: Activity: Goal: Risk for activity intolerance will decrease Outcome: Progressing   Problem: Nutrition: Goal: Adequate nutrition will be maintained Outcome: Progressing   Problem: Elimination: Goal: Will not experience complications related to bowel motility Outcome: Progressing Goal: Will not experience complications related to urinary retention Outcome: Progressing   Problem: Pain Managment: Goal: General experience of comfort will improve and/or be controlled Outcome: Progressing   Problem: Skin Integrity: Goal: Risk for impaired skin integrity will decrease Outcome: Progressing

## 2024-02-12 NOTE — Plan of Care (Signed)
  Problem: Health Behavior/Discharge Planning: Goal: Ability to manage health-related needs will improve Outcome: Progressing   Problem: Activity: Goal: Risk for activity intolerance will decrease Outcome: Progressing   Problem: Pain Managment: Goal: General experience of comfort will improve and/or be controlled Outcome: Progressing   Problem: Skin Integrity: Goal: Risk for impaired skin integrity will decrease Outcome: Progressing

## 2024-02-12 NOTE — Progress Notes (Signed)
 Nutrition Follow-up  DOCUMENTATION CODES:   Severe malnutrition in context of social or environmental circumstances, Underweight  INTERVENTION:   -Continue dysphagia 3 (mechanical soft) for ease of intake -Continue Ensure Plus High Protein po TID, each supplement provides 350 kcal and 20 grams of protein  -Continue MVI with minerals daily -Continue 500 mg vitamin C BID -Continue 220 mg zinc sulfate daily x 14 days -Check and replete for potential micronutrient deficiencies which may impeded wound healing (vitamin A)  NUTRITION DIAGNOSIS:   Severe Malnutrition related to social / environmental circumstances as evidenced by moderate fat depletion, severe fat depletion, moderate muscle depletion, severe muscle depletion.  Ongoing  GOAL:   Patient will meet greater than or equal to 90% of their needs  Progressing   MONITOR:   PO intake, Supplement acceptance  REASON FOR ASSESSMENT:   Consult Assessment of nutrition requirement/status  ASSESSMENT:   Pt with medical history significant for Hypothyroidism, rheumatoid arthritis on chronic prednisone and Renflexis  infusions, followed by rheumatology, osteoarthritis, PCP documentation of several falls requiring ED visits, and who currently uses a walker, who is being admitted for rhabdomyolysis after she was found down by a neighbor.  Reviewed I/O's: +540 ml x 24 hours and +2.3 L since admission  Spoke with pt at bedside, who was pleasant and in good spirits today. Pt reports feeling hungry and waiting for her breakfast. She shares intake has improved and that she likes the hospital food. Noted meal completions 50-100%.   Noted two half consumed Ensure supplements at bedside. Pt shares that she likes the supplements and will continue to drink them. Discussed importance of good meal and supplement intake to promote healing. Reviewed plan of care; pt amenable to continue and appreciative of visit.   No new wt since last visit.    Per TOC notes, APS report has been filed secondary to living conditions. Pt has the capacity to make here own decisions per psych evaluation. Rehab team recommending SNF.   Medications reviewed and include vitamin C, keflex, vitamin B-12, ferrous sulfate , folic acid , and zinc sulfate.   Labs reviewed. Vitamin A still pending.   Diet Order:   Diet Order             DIET DYS 3 Fluid consistency: Thin  Diet effective now                   EDUCATION NEEDS:   Education needs have been addressed  Skin:  Skin Assessment: Skin Integrity Issues: Skin Integrity Issues:: Stage II, DTI DTI: rt and lt ischial tuberosity Stage II: coccyx  Last BM:  02/10/24  Height:   Ht Readings from Last 1 Encounters:  02/07/24 5' 6 (1.676 m)    Weight:   Wt Readings from Last 1 Encounters:  02/07/24 48.5 kg    Ideal Body Weight:  59.1 kg  BMI:  Body mass index is 17.27 kg/m.  Estimated Nutritional Needs:   Kcal:  1750-1950  Protein:  100-115 grams  Fluid:  1.7-1.9 L    [name], RD, LDN, CDCES Registered Dietitian III Certified Diabetes Care and Education Specialist If unable to reach this RD, please use RD Inpatient group chat on secure chat between hours of 8am-4 pm daily

## 2024-02-12 NOTE — TOC Progression Note (Addendum)
 Transition of Care [facility]) - Progression Note    Patient Details  Name: [name] MRN: [id number] Date of Birth: March 29, 1941  Transition [facility]) CM/SW Contact  [name], [place] Phone Number: 02/12/2024, 10:03 AM  Clinical Narrative:      Pt has no bed offers. CSW sent referrals to [facility] area SNF's.    1625: pt's first choice, [facility], cannot offer bed. Her second choice, [facility], can offer a bed. CSW awaiting offer confirmation and then can initiate insurance authorization.      Social Determinants of Health (SDOH) Interventions SDOH Screenings   Food Insecurity: Patient Unable To Answer (02/08/2024)  Housing: Unknown (02/08/2024)  Transportation Needs: Patient Unable To Answer (02/08/2024)  Utilities: Patient Unable To Answer (02/08/2024)  Financial Resource Strain: Low Risk  (01/25/2023)   Received from [facility]  Social Connections: Patient Unable To Answer (02/08/2024)  Tobacco Use: Low Risk  (02/07/2024)    Readmission Risk Interventions     No data to display

## 2024-02-13 DIAGNOSIS — M069 Rheumatoid arthritis, unspecified: Secondary | ICD-10-CM | POA: Diagnosis not present

## 2024-02-13 DIAGNOSIS — E43 Unspecified severe protein-calorie malnutrition: Secondary | ICD-10-CM | POA: Diagnosis not present

## 2024-02-13 DIAGNOSIS — N179 Acute kidney failure, unspecified: Secondary | ICD-10-CM | POA: Diagnosis not present

## 2024-02-13 DIAGNOSIS — R296 Repeated falls: Secondary | ICD-10-CM | POA: Diagnosis not present

## 2024-02-13 NOTE — Progress Notes (Addendum)
 Mobility Specialist - Progress Note     02/13/24 1113  Mobility  Activity Ambulated with assistance in hallway  Level of Assistance Standby assist, set-up cues, supervision of patient - no hands on  Assistive Device Front wheel walker  Distance Ambulated (ft) 200 ft  Range of Motion/Exercises Active  Activity Response Tolerated well  Mobility Referral Yes  Mobility visit 1 Mobility  Mobility Specialist Start Time (ACUTE ONLY) 1048  Mobility Specialist Stop Time (ACUTE ONLY) 1113  Mobility Specialist Time Calculation (min) (ACUTE ONLY) 25 min   Pt resting in bed on RA upon entry. Pt moved to EOB MinA utilizing railing and MS support to sit up. Pt performed hygiene tasking before session. Pt STS MinA and ambulates to hallway around NS SBA with RW. Pt returned to bed and left with needs in reach and bed alarm activated.   Guido Rumble Mobility Specialist 02/13/24, 11:20 AM

## 2024-02-13 NOTE — Progress Notes (Addendum)
 PROGRESS NOTE    [name]  FMW:[id number] DOB: Sep 26, 1940 DOA: 02/07/2024 PCP: Glover Lenis, MD  Chief Complaint  Patient presents with   [facility] Course:  83 y.o. female with medical history significant for Hypothyroidism, rheumatoid arthritis on chronic prednisone and Renflexis  infusions, followed by rheumatology, osteoarthritis, PCP documentation of several falls requiring ED visits, and who currently uses a walker, who is being admitted for rhabdomyolysis after she was found down by a neighbor.  She was on the floor for over 24 hours. In the ED she had a low-grade temperature of 99.1 with otherwise normal vitals.  Labs showed normal CBC, Creatinine of 1.85 up from baseline of 1.1 to with bicarb of 19.  Mild LFT elevation with AST 43 and total bilirubin 2.7.  Troponin 17.  Urinalysis with many bacteria and small leuks.  EKG showed sinus arrhythmia at 88 CT head and C-spine without acute injury.  Chest x-ray nonacute showing multiple degenerative changes x-ray of the knee showing moderate right knee effusion among other findings-please see report. Patient treated with 2 L NS bolus and pain medicine.  Admission requested.   6/21.  CK down to 861 and creatinine down to 1.36.  Continue IV fluids.  PT evaluation.  Patient does not have any recollection of how she got to the hospital. 6/22.  CK down to 513 and creatinine down to 1.0.  Appreciate psychiatric consultation. 6/23.  TOC looking into rehabs.  Urine culture came back positive and started on Rocephin 6/24.  Awaiting rehab.  Rocephin changed over to Keflex.  Subjective: No acute events overnight.  Patient has no acute complaints this morning.  She is working well with mobility specialist on arrival   Objective: Vitals:   02/12/24 2020 02/13/24 0351 02/13/24 0744 02/13/24 1503  BP: 136/66 (!) 148/65 (!) 141/62 (!) 124/56  Pulse: 88 75 71 92  Resp: 15 19 15 18   Temp: 99 F (37.2 C) 97.7 F (36.5 C) 98 F (36.7  C) 98.5 F (36.9 C)  TempSrc: Oral Oral  Oral  SpO2: 96% 97% 97% 100%  Weight:      Height:        Intake/Output Summary (Last 24 hours) at 02/13/2024 1536 Last data filed at 02/13/2024 1425 Gross per 24 hour  Intake 480 ml  Output --  Net 480 ml   Filed Weights   02/07/24 1842  Weight: 48.5 kg    Examination: General exam: Appears calm and comfortable, NAD, frail appearing Respiratory system: No work of breathing, symmetric chest wall expansion Neuro: Alert and oriented. No focal neurological deficits. Extremities: Symmetric, expected ROM Skin: No rashes, lesions Psychiatry: Demonstrates appropriate judgement and insight. Mood & affect appropriate for situation.   Assessment & Plan:  Principal Problem:   Rhabdomyolysis Active Problems:   AKI (acute kidney injury) (HCC)   On chronic immunosuppressive therapy   Multiple falls   Acute cystitis without hematuria   Acquired hypothyroidism   Rheumatoid arthritis involving multiple sites (HCC)   Memory loss   Pressure injury of skin   Protein-calorie malnutrition, severe    Rhabdomyolysis - CK peaked at 1195, has downtrended now - No further CK trend  AKI, resolved - Initial creatinine 1.85, has resolved to baseline - Renally dosed with creatinine clearance of 41 when needed - Have discontinued further trends  Acute cystitis without hematuria - Urine culture with E. coli and Klebsiella, sensitive to Keflex.  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  TOC working on APS report. - Patient to discharge to rehab when this is arranged  Rheumatoid arthritis of multiple sites On chronic immunosuppressive therapy - On methotrexate and folic acid  - Patient denies taking prednisone on a daily basis.  Will hold for now  Acquired hypothyroidism - Continue levothyroxine at current  dose  Protein calorie malnutrition, severe - Continue supplements  Pressure injury of skin - Wound nurse consulted  Memory loss - Patient is demonstrating difficulty with short-term recall, also cannot report the events prior to this hospitalization - Workup revealed slightly elevated TSH, RPR nonreactive, HIV nonreactive, B12 within normal limits - Psychiatry has seen and reports she does have capacity to make decisions at this time. - Would benefit from further outpatient workup for neuropsych testing   DVT prophylaxis: Lovenox    Code Status: Full Code Disposition:  Inpatient pending rehab placement.  Medically clear for discharge when this has been arranged  Consultants:    Procedures:    Antimicrobials:  Anti-infectives (From admission, onward)    Start     Dose/Rate Route Frequency Ordered Stop   02/11/24 1000  cephALEXin (KEFLEX) capsule 500 mg        500 mg Oral Every 12 hours 02/10/24 1435 02/14/24 0959   02/09/24 1330  cefTRIAXone (ROCEPHIN) 1 g in sodium chloride  0.9 % 100 mL IVPB  Status:  Discontinued        1 g 200 mL/hr over 30 Minutes Intravenous Every 24 hours 02/09/24 1239 02/10/24 1435       Data Reviewed: I have personally reviewed following labs and imaging studies CBC: Recent Labs  Lab 02/07/24 1845 02/08/24 0605 02/11/24 0421  WBC 4.2 4.3 3.9*  HGB 12.4 10.7* 12.4  HCT 37.7 33.6* 38.3  MCV 92.9 92.3 91.2  PLT 162 146* 146*   Basic Metabolic Panel: Recent Labs  Lab 02/07/24 1845 02/08/24 0605 02/09/24 0134 02/11/24 0421  NA 141 137 139 138  K 4.1 3.6 3.4* 3.7  CL 107 107 110 103  CO2 19* 22 24 28   GLUCOSE 147* 84 75 80  BUN 47* 38* 32* 10  CREATININE 1.85* 1.36* 1.00 0.78  CALCIUM 8.6* 7.3* 7.7* 8.3*   GFR: Estimated Creatinine Clearance: 41.5 mL/min (by C-G formula based on SCr of 0.78 mg/dL). Liver Function Tests: Recent Labs  Lab 02/07/24 1845 02/08/24 0605  AST 43* 31  ALT 14 12  ALKPHOS 62 48  BILITOT 2.3* 1.4*   PROT 7.2 5.8*  ALBUMIN 3.3* 2.7*   CBG: No results for input(s): GLUCAP in the last 168 hours.  Recent Results (from the past 240 hours)  Urine Culture     Status: Abnormal   Collection Time: 02/07/24  8:36 PM   Specimen: Urine, Random  Result Value Ref Range Status   Specimen Description   Final    URINE, RANDOM Performed at [facility], [address] [place], [place] [address]    Special Requests   Final    NONE Reflexed from [id number] Performed at [facility], [address] [place], [place] [address]    Culture (A)  Final    >=100,000 COLONIES/mL ESCHERICHIA COLI 50,000 COLONIES/mL KLEBSIELLA PNEUMONIAE    Report Status 02/10/2024 FINAL  Final   Organism ID, Bacteria ESCHERICHIA COLI (A)  Final   Organism ID, Bacteria KLEBSIELLA PNEUMONIAE (A)  Final  Susceptibility   Escherichia coli - MIC*    AMPICILLIN 8 SENSITIVE Sensitive     CEFAZOLIN  <=4 SENSITIVE Sensitive     CEFEPIME <=0.12 SENSITIVE Sensitive     CEFTRIAXONE <=0.25 SENSITIVE Sensitive     CIPROFLOXACIN <=0.25 SENSITIVE Sensitive     GENTAMICIN <=1 SENSITIVE Sensitive     IMIPENEM <=0.25 SENSITIVE Sensitive     NITROFURANTOIN <=16 SENSITIVE Sensitive     TRIMETH/SULFA <=20 SENSITIVE Sensitive     AMPICILLIN/SULBACTAM 4 SENSITIVE Sensitive     PIP/TAZO <=4 SENSITIVE Sensitive ug/mL    * >=100,000 COLONIES/mL ESCHERICHIA COLI   Klebsiella pneumoniae - MIC*    AMPICILLIN >=32 RESISTANT Resistant     CEFAZOLIN  <=4 SENSITIVE Sensitive     CEFEPIME <=0.12 SENSITIVE Sensitive     CEFTRIAXONE <=0.25 SENSITIVE Sensitive     CIPROFLOXACIN <=0.25 SENSITIVE Sensitive     GENTAMICIN <=1 SENSITIVE Sensitive     IMIPENEM <=0.25 SENSITIVE Sensitive     NITROFURANTOIN 128 RESISTANT Resistant     TRIMETH/SULFA <=20 SENSITIVE Sensitive     AMPICILLIN/SULBACTAM 4 SENSITIVE Sensitive     PIP/TAZO <=4 SENSITIVE Sensitive ug/mL    * 50,000 COLONIES/mL KLEBSIELLA PNEUMONIAE     Radiology  Studies: No results found.  Scheduled Meds:  vitamin C  500 mg Oral BID   aspirin  EC  81 mg Oral Daily   cephALEXin  500 mg Oral Q12H   cyanocobalamin   1,000 mcg Oral Daily   enoxaparin  (LOVENOX ) injection  40 mg Subcutaneous Q24H   feeding supplement  237 mL Oral TID BM   ferrous sulfate   325 mg Oral Daily   folic acid   1 mg Oral Daily   levothyroxine  88 mcg Oral Q0600   multivitamin with minerals  1 tablet Oral Daily   zinc sulfate (50mg  elemental zinc)  220 mg Oral Daily   Continuous Infusions:   LOS: 5 days  MDM: Patient is high risk for one or more organ failure.  They necessitate ongoing hospitalization for continued IV therapies and subsequent lab monitoring. Total time spent interpreting labs and vitals, reviewing the medical record, coordinating care amongst consultants and care team members, directly assessing and discussing care with the patient and/or family: 35 min  Daielle Melcher, DO [facility]  To contact the attending physician between 7A-7P please use Epic Chat. To contact the covering physician during after hours 7P-7A, please review Amion.  02/13/2024, 3:36 PM   *This document has been created with the assistance of dictation software. Please excuse typographical errors. *

## 2024-02-13 NOTE — Plan of Care (Signed)

## 2024-02-13 NOTE — Progress Notes (Signed)
 PT Cancellation Note  Patient Details Name: [name] MRN: [id number] DOB: 01/16/1941   Cancelled Treatment:    Reason Eval/Treat Not Completed: Other (comment) Attempted to see pt x2 this afternoon.  Pt was being helped by nursing staff in the bathroom on first attempt.  On second attempt she was eating dinner and pleasantly refused citing working with mobility tech and OT earlier today.  Will maintain on caseload and attempt to see as appropriate.    [name], DTaP 02/13/2024, 5:33 PM

## 2024-02-13 NOTE — TOC Progression Note (Addendum)
 Transition of Care [facility]) - Progression Note    Patient Details  Name: [name] MRN: [id number] Date of Birth: 27-Feb-1941  [facility] CM/SW Contact  Dalia GORMAN Fuse, RN Phone Number: 02/13/2024, 10:27 AM  Clinical Narrative:     TOC spoke with the patient in her room about bed offers, [facility], [facility], and [facility]. She advised that she has been to [facility] before, she would like to accept the offer.  TOC outreached to Kaylah at [facility] and advised the patient has accepted the bed. The patient's [employer] is managed by [facility], TOC requested that WOM Initiate ins auth request.       Expected Discharge Plan and Services                                               Social Determinants of Health (SDOH) Interventions SDOH Screenings   Food Insecurity: Patient Unable To Answer (02/08/2024)  Housing: Unknown (02/08/2024)  Transportation Needs: Patient Unable To Answer (02/08/2024)  Utilities: Patient Unable To Answer (02/08/2024)  Financial Resource Strain: Low Risk  (01/25/2023)   Received [facility]  Social Connections: Patient Unable To Answer (02/08/2024)  Tobacco Use: Low Risk  (02/07/2024)    Readmission Risk Interventions     No data to display

## 2024-02-13 NOTE — Progress Notes (Signed)
 Occupational Therapy Treatment Patient Details Name: [name] MRN: [id number] DOB: 1941/08/18 Today's Date: 02/13/2024   History of present illness [name] is a 83 y.o. female with medical history significant for Hypothyroidism, rheumatoid arthritis on chronic prednisone and Renflexis  infusions, followed by rheumatology, osteoarthritis, PCP documentation of several falls requiring ED visits, and who currently uses a walker, who is being admitted for rhabdomyolysis after she was found down by a neighbor.  She was on the floor for over 24 hours.   OT comments  Pt seen for OT treatment on this date. Upon arrival to room pt semi supine in bed, agreeable to tx. Pt requires MINA to come to sitting position on the EOB. Pt having difficulty scooting self to EOB in prep for standing. Pt performed STS from the EOB with verbal/tactile cues for technique to refrain from pulling RW towards self with bilateral UE to support STS. Pt tolerated prolonged standing at the sink when completing sink level grooming tasks. Pt required MINA to complete tasks. Pt amb into BR with RW, verbal cues for scanning area to avoid bumping to into furniture. Noted one LOB during pericare pt required external support to correct LOB. 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 Pt making good progress toward goals, will continue to follow POC. Discharge recommendation remains appropriate.        If plan is discharge home, recommend the following:  A little help with walking and/or transfers;A little help with bathing/dressing/bathroom;Direct supervision/assist for financial management;Direct supervision/assist for medications management;Assist for transportation   Equipment Recommendations  Other (comment)    Recommendations for Other Services      Precautions / Restrictions Precautions Precautions: Fall Recall of Precautions/Restrictions: Impaired Restrictions Weight Bearing Restrictions Per Provider Order: No        Mobility Bed Mobility Overal bed mobility: Needs Assistance Bed Mobility: Supine to Sit     Supine to sit: Min assist     General bed mobility comments: MINA to come to EOB, trouble with problem solving scooting, able to ask for UE support to assist in pulling self out of bed.    Transfers Overall transfer level: Needs assistance Equipment used: Rolling walker (2 wheels) Transfers: Sit to/from Stand Sit to Stand: Contact guard assist           General transfer comment: Verbal cues for teachnique and hand placement to STS. Pt eager to pull with bil UE on RW.     Balance Overall balance assessment: Needs assistance Sitting-balance support: Feet supported, No upper extremity supported Sitting balance-Leahy Scale: Good Sitting balance - Comments: Steady reaching within BOS   Standing balance support: Bilateral upper extremity supported Standing balance-Leahy Scale: Fair Standing balance comment: Trunk flexed during amb with RW                           ADL either performed or assessed with clinical judgement   ADL Overall ADL's : Needs assistance/impaired     Grooming: Wash/dry hands;Wash/dry face;Oral care;Standing;Minimal assistance;Brushing hair Grooming Details (indicate cue type and reason): Sink level ADLs with RW + MINA             Lower Body Dressing: Maximal assistance;Sit to/from stand   Toilet Transfer: Ambulation;Rolling walker (2 wheels);Contact guard assist   Toileting- Clothing Manipulation and Hygiene: Contact guard assist;Sit to/from stand;Cueing for safety       Functional mobility during ADLs: Contact guard assist;Rolling walker (2 wheels) General ADL  Comments: MINA sink level grooming, MOD for hair combing due to limited shoulder flexion    Extremity/Trunk Assessment              Vision       Perception     Praxis     Communication Communication Communication: No apparent difficulties   Cognition  Arousal: Alert Behavior During Therapy: WFL for tasks assessed/performed Cognition: No family/caregiver present to determine baseline             OT - Cognition Comments: A/Ox3, Pt was more alert but thought she was already at rehab facility.                 Following commands: Impaired Following commands impaired: Follows multi-step commands with increased time      Cueing   Cueing Techniques: Verbal cues, Gestural cues  Exercises Exercises: Other exercises Other Exercises Other Exercises: Edu: Safe transfer technique with use of RW, DME management during ADLs    Shoulder Instructions       General Comments Visible scabs covering pt's legs, IV site itching.    Pertinent Vitals/ Pain       Pain Assessment Pain Assessment: No/denies pain  Home Living                                          Prior Functioning/Environment              Frequency  Min 2X/week        Progress Toward Goals  OT Goals(current goals can now be found in the care plan section)  Progress towards OT goals: Progressing toward goals  Acute Rehab OT Goals OT Goal Formulation: With patient Time For Goal Achievement: 02/22/24 Potential to Achieve Goals: Good ADL Goals Pt Will Perform Grooming: with modified independence;standing Pt Will Perform Lower Body Dressing: with modified independence;sitting/lateral leans Pt Will Transfer to Toilet: with modified independence;ambulating Pt Will Perform Toileting - Clothing Manipulation and hygiene: with modified independence;sit to/from stand  Plan      Co-evaluation    PT/OT/SLP Co-Evaluation/Treatment: Yes Reason for Co-Treatment: Complexity of the patient's impairments (multi-system involvement);To address functional/ADL transfers PT goals addressed during session: Mobility/safety with mobility;Balance OT goals addressed during session: ADL's and self-care;Proper use of Adaptive equipment and DME      AM-PAC  OT 6 Clicks Daily Activity     Outcome Measure   Help from another person eating meals?: None Help from another person taking care of personal grooming?: A Little Help from another person toileting, which includes using toliet, bedpan, or urinal?: A Little Help from another person bathing (including washing, rinsing, drying)?: A Little Help from another person to put on and taking off regular upper body clothing?: A Little Help from another person to put on and taking off regular lower body clothing?: A Little 6 Click Score: 19    End of Session Equipment Utilized During Treatment: Rolling walker (2 wheels)  OT Visit Diagnosis: Unsteadiness on feet (R26.81);Other abnormalities of gait and mobility (R26.89);Repeated falls (R29.6);Muscle weakness (generalized) (M62.81)   Activity Tolerance Patient tolerated treatment well   Patient Left in chair;with call bell/phone within reach;with chair alarm set   Nurse Communication Mobility status        Time: 8687-8664 OT Time Calculation (min): 23 min  Charges: OT General Charges $OT Visit: 1 Visit OT Treatments $Self Care/Home Management : 23-37  mins  Larraine Colas M.S. OTR/L  02/13/24, 2:31 PM

## 2024-02-13 NOTE — Progress Notes (Cosign Needed)
 Patient is alert but disoriented to place and time, she believes that she is in rehab and does not recall how she fell or how long she was laying in the floor. Patient has been able to ambulate with the assistance of physical therapy and staff using her walker. The patient was educated on using the call bell when she needs to use the bathroom but she did have a couple of urinary incontinence episodes where she stated that she cannot control it, Patient also pulled her IV line because she thought that she did not need it. Patient is in bed with bed alarm on and call bell within reach.

## 2024-02-14 DIAGNOSIS — N179 Acute kidney failure, unspecified: Secondary | ICD-10-CM | POA: Diagnosis not present

## 2024-02-14 DIAGNOSIS — M069 Rheumatoid arthritis, unspecified: Secondary | ICD-10-CM | POA: Diagnosis not present

## 2024-02-14 DIAGNOSIS — R296 Repeated falls: Secondary | ICD-10-CM | POA: Diagnosis not present

## 2024-02-14 DIAGNOSIS — E43 Unspecified severe protein-calorie malnutrition: Secondary | ICD-10-CM | POA: Diagnosis not present

## 2024-02-14 MED ORDER — DIPHENHYDRAMINE HCL 25 MG PO CAPS
25.0000 mg | ORAL_CAPSULE | Freq: Once | ORAL | Status: AC
  Administered 2024-02-14: 25 mg via ORAL
  Filled 2024-02-14: qty 1

## 2024-02-14 NOTE — Progress Notes (Signed)
 Physical Therapy Treatment Patient Details Name: [name] MRN: [id number] DOB: 1941-04-23 Today's Date: 02/14/2024   History of Present Illness Braylyn L Woodhead is a 83 y.o. female with medical history significant for Hypothyroidism, rheumatoid arthritis on chronic prednisone and Renflexis  infusions, followed by rheumatology, osteoarthritis, PCP documentation of several falls requiring ED visits, and who currently uses a walker, who is being admitted for rhabdomyolysis after she was found down by a neighbor.  She was on the floor for over 24 hours.    PT Comments  Pt was pleasant and motivated to participate during the session and put forth good effort throughout. Pt required physical assistance for bed mobility tasks, per below. Pt continues to require VC's during ambulation to keep RW within BOS and encourage upright posture. Pt demonstrated difficulty in standing balance activity when BOS was modified, per below. Pt reported no adverse symptoms during the session with SpO2 and HR WNL throughout on room air. Given the pt requiring VC's during ambulation to ensure safety, difficulty maintaining balance in standing activities with modified BOS, and previous history of falls, pt is at an increased risk for falls in the future. Pt will benefit from continued PT services upon discharge to safely address deficits listed in patient problem list for decreased caregiver assistance and eventual return to PLOF.    If plan is discharge home, recommend the following: Assistance with cooking/housework;A little help with bathing/dressing/bathroom;A little help with walking and/or transfers;Supervision due to cognitive status;Assist for transportation;Help with stairs or ramp for entrance   Can travel by private vehicle     Yes  Equipment Recommendations  BSC/3in1    Recommendations for Other Services       Precautions / Restrictions Precautions Precautions: Fall Recall of Precautions/Restrictions:  Impaired Restrictions Weight Bearing Restrictions Per Provider Order: No     Mobility  Bed Mobility Overal bed mobility: Needs Assistance Bed Mobility: Supine to Sit     Supine to sit: Min assist     General bed mobility comments: Pt required Min A to come to sitting EOB from supine, requiring +1 HHA to scoot to EOB and to lean trunk forward    Transfers Overall transfer level: Needs assistance Equipment used: Rolling walker (2 wheels) Transfers: Sit to/from Stand Sit to Stand: Contact guard assist           General transfer comment: Pt required VC's for hand placement to push off from EOB to complete STS. Pt required multiple rocking efforts to come to standing    Ambulation/Gait Ambulation/Gait assistance: Contact guard assist Gait Distance (Feet): 200 Feet Assistive device: Rolling walker (2 wheels) Gait Pattern/deviations: Step-to pattern, Step-through pattern, Decreased step length - right, Decreased step length - left, Trunk flexed, Shuffle Gait velocity: decreased     General Gait Details: Pt required CGA for ambulation with RW. Pt flexed trunk over RW and VC's were utilized to encourage upright posture with minimal pt carryover. VC's were also utilized to keep RW within BOS with minimal pt carryover as well.   Stairs             Wheelchair Mobility     Tilt Bed    Modified Rankin (Stroke Patients Only)       Balance Overall balance assessment: Needs assistance Sitting-balance support: Feet supported, No upper extremity supported Sitting balance-Leahy Scale: Good     Standing balance support: Bilateral upper extremity supported, During functional activity, Reliant on assistive device for balance Standing balance-Leahy Scale: Fair Standing balance comment:  Pt is able to maintain standing balance in standing and ambulation, but is reliant on RW to do so             High level balance activites: Other (comment) (Standing balance activities  involving reaching for bottles on table and moving them from one side to the other in various types of BOS) High Level Balance Comments: Pt completed standing balance activity involving moving bottles from one end of the table to the other with normal BOS, narrow BOS, and in semi-tandem without AD. Pt picked up 6 bottles from the left side of the table and moved them to the right and vice versa. Pt constantly was grabbing table with opposite hand to maintain balance while completing this activity with narrow BOS and in semi-tandem. VC's were utilized to encurage not using other hand to maintain balance with moderate pt carryover. Pt required min A to maintain balance during this activity when BOS was modified.            Communication Communication Communication: No apparent difficulties  Cognition Arousal: Alert Behavior During Therapy: WFL for tasks assessed/performed   PT - Cognitive impairments: No apparent impairments                         Following commands: Impaired Following commands impaired: Only follows one step commands consistently    Cueing Cueing Techniques: Verbal cues, Gestural cues  Exercises      General Comments        Pertinent Vitals/Pain Pain Assessment Pain Assessment: 0-10 Pain Score: 3  Pain Location: L arm Pain Descriptors / Indicators: Dull, Nagging Pain Intervention(s): Monitored during session    Home Living                          Prior Function            PT Goals (current goals can now be found in the care plan section) Progress towards PT goals: Progressing toward goals    Frequency    Min 2X/week      PT Plan      Co-evaluation              AM-PAC PT 6 Clicks Mobility   Outcome Measure  Help needed turning from your back to your side while in a flat bed without using bedrails?: A Little Help needed moving from lying on your back to sitting on the side of a flat bed without using bedrails?: A  Little Help needed moving to and from a bed to a chair (including a wheelchair)?: A Little Help needed standing up from a chair using your arms (e.g., wheelchair or bedside chair)?: A Little Help needed to walk in hospital room?: A Little Help needed climbing 3-5 steps with a railing? : A Lot 6 Click Score: 17    End of Session Equipment Utilized During Treatment: Gait belt Activity Tolerance: Patient tolerated treatment well Patient left: in chair;with call bell/phone within reach;with chair alarm set Nurse Communication: Mobility status PT Visit Diagnosis: Other abnormalities of gait and mobility (R26.89);Muscle weakness (generalized) (M62.81);Repeated falls (R29.6);History of falling (Z91.81);Unsteadiness on feet (R26.81)     Time: 8580-8542 PT Time Calculation (min) (ACUTE ONLY): 38 min  Charges:                            Leontine Ingles, SPT 02/14/24, 4:45 PM

## 2024-02-14 NOTE — Plan of Care (Signed)

## 2024-02-14 NOTE — Consult Note (Addendum)
 WOC Nurse Consult Note: Consult requested for deep tissue pressure injuries.  This was already performed on 6/23; refer to previous consult notes for assessment, and topical treatment orders have been provided for bedside nurses to perform.  Please re-consult if further assistance is needed.  Thank-you,  Stephane Fought MSN, RN, CWOCN, [place], CNS [phone]

## 2024-02-14 NOTE — Progress Notes (Signed)
 Occupational Therapy Treatment Patient Details Name: [name] MRN: [id number] DOB: Jul 07, 1941 Today's Date: 02/14/2024   History of present illness [name] is a 83 y.o. female with medical history significant for Hypothyroidism, rheumatoid arthritis on chronic prednisone and Renflexis  infusions, followed by rheumatology, osteoarthritis, PCP documentation of several falls requiring ED visits, and who currently uses a walker, who is being admitted for rhabdomyolysis after she was found down by a neighbor.  She was on the floor for over 24 hours.   OT comments  Pt seen for OT treatment on this date. Upon arrival to room pt seated in recliner chair with request to return to bed because she had been OOB for a while, agreeable to tx. Therapist provided education on the benefits of remaining OOB throughout the day to support building activity tolerance and was encouraged to completed grooming tasks at sink prior to returning to bed in prep for lunch, patient declined and requested to return to bed. Pt requires CGA for sit to stand to RW and CGA to access bed with safety awareness training to reduce risk of falls.  Patient returned to supine with HOB elevated. Pt making good progress toward goals, will continue to follow POC. Discharge recommendation remains appropriate.        If plan is discharge home, recommend the following:  A little help with walking and/or transfers;A little help with bathing/dressing/bathroom;Direct supervision/assist for financial management;Direct supervision/assist for medications management;Assist for transportation   Equipment Recommendations       Recommendations for Other Services      Precautions / Restrictions Precautions Precautions: Fall Recall of Precautions/Restrictions: Impaired       Mobility Bed Mobility Overal bed mobility: Needs Assistance                  Transfers Overall transfer level: Needs assistance Equipment used:  Rolling walker (2 wheels) Transfers: Sit to/from Stand, Bed to chair/wheelchair/BSC Sit to Stand: Contact guard assist           General transfer comment: Verbal cues for teachnique and hand placement to STS. Cuing required for safe management of self and RW in small spaces     Balance Overall balance assessment: Needs assistance Sitting-balance support: Feet supported, No upper extremity supported Sitting balance-Leahy Scale: Good     Standing balance support: Bilateral upper extremity supported Standing balance-Leahy Scale: Fair                             ADL either performed or assessed with clinical judgement   ADL Overall ADL's : Needs assistance/impaired                         Toilet Transfer: Rolling walker (2 wheels);Contact guard assist Toilet Transfer Details (indicate cue type and reason): simulation         Functional mobility during ADLs: Contact guard assist;Rolling walker (2 wheels)      Extremity/Trunk Assessment Upper Extremity Assessment Upper Extremity Assessment: Generalized weakness            Vision       Perception     Praxis     Communication Communication Communication: No apparent difficulties   Cognition Arousal: Alert Behavior During Therapy: WFL for tasks assessed/performed  Following commands: Impaired Following commands impaired: Only follows one step commands consistently      Cueing   Cueing Techniques: Verbal cues, Gestural cues  Exercises Other Exercises Other Exercises: education on safe transfer techniques when using RW to reduce risk of falls    Shoulder Instructions       General Comments      Pertinent Vitals/ Pain       Pain Assessment Pain Assessment: No/denies pain  Home Living                                          Prior Functioning/Environment              Frequency  Min 2X/week         Progress Toward Goals  OT Goals(current goals can now be found in the care plan section)  Progress towards OT goals: Progressing toward goals     Plan      Co-evaluation                 AM-PAC OT 6 Clicks Daily Activity     Outcome Measure   Help from another person eating meals?: None Help from another person taking care of personal grooming?: A Little Help from another person toileting, which includes using toliet, bedpan, or urinal?: A Little Help from another person bathing (including washing, rinsing, drying)?: A Little Help from another person to put on and taking off regular upper body clothing?: A Little Help from another person to put on and taking off regular lower body clothing?: A Little 6 Click Score: 19    End of Session Equipment Utilized During Treatment: Rolling walker (2 wheels);Gait belt  OT Visit Diagnosis: Unsteadiness on feet (R26.81);Other abnormalities of gait and mobility (R26.89);Repeated falls (R29.6);Muscle weakness (generalized) (M62.81)   Activity Tolerance Patient tolerated treatment well   Patient Left in bed;with call bell/phone within reach;with bed alarm set   Nurse Communication Mobility status        Time: 8851-8799 OT Time Calculation (min): 12 min  Charges: OT General Charges $OT Visit: 1 Visit  Harlene Sharps OTR/L   Harlene LITTIE Sharps 02/14/2024, 12:12 PM

## 2024-02-14 NOTE — Progress Notes (Addendum)
 PROGRESS NOTE    [name]  FMW:[id number] DOB: 1940-09-01 DOA: 02/07/2024 PCP: Glover Lenis, MD  Chief Complaint  Patient presents with   [facility] Course:  83 y.o. female with medical history significant for Hypothyroidism, rheumatoid arthritis on chronic prednisone and Renflexis  infusions, followed by rheumatology, osteoarthritis, PCP documentation of several falls requiring ED visits, and who currently uses a walker, who is being admitted for rhabdomyolysis after she was found down by a neighbor.  She was on the floor for over 24 hours. In the ED she had a low-grade temperature of 99.1 with otherwise normal vitals.  Labs showed normal CBC, Creatinine of 1.85 up from baseline of 1.1 to with bicarb of 19.  Mild LFT elevation with AST 43 and total bilirubin 2.7.  Troponin 17.  Urinalysis with many bacteria and small leuks.  EKG showed sinus arrhythmia at 88 CT head and C-spine without acute injury.  Chest x-ray nonacute showing multiple degenerative changes x-ray of the knee showing moderate right knee effusion among other findings-please see report. Patient treated with 2 L NS bolus and pain medicine.  Admission requested.   6/21.  CK down to 861 and creatinine down to 1.36.  Continue IV fluids.  PT evaluation.  Patient does not have any recollection of how she got to the hospital. 6/22.  CK down to 513 and creatinine down to 1.0.  Appreciate psychiatric consultation. 6/23.  TOC looking into rehabs.  Urine culture came back positive and started on Rocephin  6/24.  Awaiting rehab.  Rocephin  changed over to Keflex .  Subjective: No acute events overnight.  Bedside RN has endorsed concern of deep tissue pressure injuries today.  Wound RN was consulted and agreed with previously prescribed treatment plan.  Objective: Vitals:   02/13/24 1503 02/13/24 1949 02/14/24 0429 02/14/24 0747  BP: (!) 124/56 (!) 95/53 137/67 133/60  Pulse: 92 88 72 72  Resp: 18 14 16 16   Temp: 98.5 F  (36.9 C) 98.4 F (36.9 C) 98.3 F (36.8 C) 98.1 F (36.7 C)  TempSrc: Oral     SpO2: 100% 97% 97% 96%  Weight:      Height:       No intake or output data in the 24 hours ending 02/14/24 1443  Filed Weights   02/07/24 1842  Weight: 48.5 kg    Examination: General exam: Appears calm and comfortable, NAD, frail appearing Respiratory system: No work of breathing, symmetric chest wall expansion Neuro: Alert and oriented. No focal neurological deficits. Extremities: Symmetric, expected ROM Skin: No rashes, lesions Psychiatry: Demonstrates appropriate judgement and insight. Mood & affect appropriate for situation.   Assessment & Plan:  Principal Problem:   Rhabdomyolysis Active Problems:   AKI (acute kidney injury) (HCC)   On chronic immunosuppressive therapy   Multiple falls   Acute cystitis without hematuria   Acquired hypothyroidism   Rheumatoid arthritis involving multiple sites (HCC)   Memory loss   Pressure injury of skin   Protein-calorie malnutrition, severe    Rhabdomyolysis - CK peaked at 1195, has downtrended now - No further CK trend  AKI, resolved - Initial creatinine 1.85, has resolved to baseline - Renally dosed with a creatinine clearance of 41 when needed - As needed labs  Acute cystitis without hematuria - Urine culture with E. coli and Klebsiella, sensitive to Keflex .  Status post antibiotic course.  Completed 6/27.    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  TOC working on APS report. - Patient to discharge to rehab when this is arranged  Rheumatoid arthritis of multiple sites On chronic immunosuppressive therapy - On methotrexate and folic acid  - Patient denies taking prednisone on a daily basis.  Will hold for now  Acquired hypothyroidism - TSH elevated.  Levothyroxine  adjusted.  Continue at current dose. - Outpatient  follow-up with PCP for continued monitoring and prescribing   Protein calorie malnutrition, severe - Continue supplements  Pressure injury of skin - Wound nurse consulted  Memory loss - Patient is demonstrating difficulty with short-term recall, also cannot report the events prior to this hospitalization - Workup revealed slightly elevated TSH, RPR nonreactive, HIV nonreactive, B12 within normal limits - Psychiatry has seen and reports she does have capacity to make decisions at this time. - Would benefit from further outpatient workup for neuropsych testing   DVT prophylaxis: Lovenox    Code Status: Full Code Disposition:  Inpatient pending rehab placement.  Medically clear for discharge when this has been arranged. Was instructed to call for peer to peer today.  I have done so.  I am awaiting return call from insurance company. Consultants:    Procedures:    Antimicrobials:  Anti-infectives (From admission, onward)    Start     Dose/Rate Route Frequency Ordered Stop   02/11/24 1000  cephALEXin  (KEFLEX ) capsule 500 mg        500 mg Oral Every 12 hours 02/10/24 1435 02/13/24 2139   02/09/24 1330  cefTRIAXone  (ROCEPHIN ) 1 g in sodium chloride  0.9 % 100 mL IVPB  Status:  Discontinued        1 g 200 mL/hr over 30 Minutes Intravenous Every 24 hours 02/09/24 1239 02/10/24 1435       Data Reviewed: I have personally reviewed following labs and imaging studies CBC: Recent Labs  Lab 02/07/24 1845 02/08/24 0605 02/11/24 0421  WBC 4.2 4.3 3.9*  HGB 12.4 10.7* 12.4  HCT 37.7 33.6* 38.3  MCV 92.9 92.3 91.2  PLT 162 146* 146*   Basic Metabolic Panel: Recent Labs  Lab 02/07/24 1845 02/08/24 0605 02/09/24 0134 02/11/24 0421  NA 141 137 139 138  K 4.1 3.6 3.4* 3.7  CL 107 107 110 103  CO2 19* 22 24 28   GLUCOSE 147* 84 75 80  BUN 47* 38* 32* 10  CREATININE 1.85* 1.36* 1.00 0.78  CALCIUM 8.6* 7.3* 7.7* 8.3*   GFR: Estimated Creatinine Clearance: 41.5 mL/min (by C-G  formula based on SCr of 0.78 mg/dL). Liver Function Tests: Recent Labs  Lab 02/07/24 1845 02/08/24 0605  AST 43* 31  ALT 14 12  ALKPHOS 62 48  BILITOT 2.3* 1.4*  PROT 7.2 5.8*  ALBUMIN 3.3* 2.7*   CBG: No results for input(s): GLUCAP in the last 168 hours.  Recent Results (from the past 240 hours)  Urine Culture     Status: Abnormal   Collection Time: 02/07/24  8:36 PM   Specimen: Urine, Random  Result Value Ref Range Status   Specimen Description   Final    URINE, RANDOM Performed at [facility], [address], [place], [place] [address]    Special Requests   Final    NONE Reflexed from [id number] Performed at [facility], [address] [place], [place] [address]    Culture (A)  Final    >=100,000 COLONIES/mL ESCHERICHIA COLI 50,000 COLONIES/mL KLEBSIELLA PNEUMONIAE    Report Status  02/10/2024 FINAL  Final   Organism ID, Bacteria ESCHERICHIA COLI (A)  Final   Organism ID, Bacteria KLEBSIELLA PNEUMONIAE (A)  Final      Susceptibility   Escherichia coli - MIC*    AMPICILLIN 8 SENSITIVE Sensitive     CEFAZOLIN  <=4 SENSITIVE Sensitive     CEFEPIME <=0.12 SENSITIVE Sensitive     CEFTRIAXONE  <=0.25 SENSITIVE Sensitive     CIPROFLOXACIN <=0.25 SENSITIVE Sensitive     GENTAMICIN <=1 SENSITIVE Sensitive     IMIPENEM <=0.25 SENSITIVE Sensitive     NITROFURANTOIN <=16 SENSITIVE Sensitive     TRIMETH/SULFA <=20 SENSITIVE Sensitive     AMPICILLIN/SULBACTAM 4 SENSITIVE Sensitive     PIP/TAZO <=4 SENSITIVE Sensitive ug/mL    * >=100,000 COLONIES/mL ESCHERICHIA COLI   Klebsiella pneumoniae - MIC*    AMPICILLIN >=32 RESISTANT Resistant     CEFAZOLIN  <=4 SENSITIVE Sensitive     CEFEPIME <=0.12 SENSITIVE Sensitive     CEFTRIAXONE  <=0.25 SENSITIVE Sensitive     CIPROFLOXACIN <=0.25 SENSITIVE Sensitive     GENTAMICIN <=1 SENSITIVE Sensitive     IMIPENEM <=0.25 SENSITIVE Sensitive     NITROFURANTOIN 128 RESISTANT Resistant     TRIMETH/SULFA <=20 SENSITIVE  Sensitive     AMPICILLIN/SULBACTAM 4 SENSITIVE Sensitive     PIP/TAZO <=4 SENSITIVE Sensitive ug/mL    * 50,000 COLONIES/mL KLEBSIELLA PNEUMONIAE     Radiology Studies: No results found.  Scheduled Meds:  vitamin C   500 mg Oral BID   aspirin  EC  81 mg Oral Daily   cyanocobalamin   1,000 mcg Oral Daily   enoxaparin  (LOVENOX ) injection  40 mg Subcutaneous Q24H   feeding supplement  237 mL Oral TID BM   ferrous sulfate   325 mg Oral Daily   folic acid   1 mg Oral Daily   levothyroxine   88 mcg Oral Q0600   multivitamin with minerals  1 tablet Oral Daily   zinc  sulfate (50mg  elemental zinc )  220 mg Oral Daily   Continuous Infusions:   LOS: 6 days  MDM: Total time spent interpreting labs and vitals, reviewing the medical record, coordinating care amongst consultants and care team members, PERFORMING PEER TO PEER, directly assessing and discussing care with the patient and/or family: 35 min  Lorane Poland, DO [facility]  To contact the attending physician between 7A-7P please use Epic Chat. To contact the covering physician during after hours 7P-7A, please review Amion.  02/14/2024, 2:43 PM   *This document has been created with the assistance of dictation software. Please excuse typographical errors. *

## 2024-02-14 NOTE — Progress Notes (Signed)
 Mobility Specialist - Progress Note     02/14/24 1000  Mobility  Activity Ambulated with assistance to bathroom;Stood at bedside;Dangled on edge of bed;Turned to right side;Turned to left side;Turned to back - supine  Level of Assistance Standby assist, set-up cues, supervision of patient - no hands on  Assistive Device Front wheel walker  Distance Ambulated (ft) 20 ft  Range of Motion/Exercises Active  Activity Response Tolerated well  Mobility Referral Yes  Mobility visit 1 Mobility  Mobility Specialist Start Time (ACUTE ONLY) 0957  Mobility Specialist Stop Time (ACUTE ONLY) 1027  Mobility Specialist Time Calculation (min) (ACUTE ONLY) 30 min   Pt resting in bed on RA upon entry. Pt STS x3 during hygiene and ambulates to bathroom before returning to finish bath in bed. Pt rolled left and right x2 and left in bed with needs in reach and bed alarm activated. RN changed wound dressing.   Guido Rumble Mobility Specialist 02/14/24, 11:35 AM

## 2024-02-14 NOTE — Plan of Care (Signed)

## 2024-02-15 DIAGNOSIS — M6282 Rhabdomyolysis: Secondary | ICD-10-CM | POA: Diagnosis not present

## 2024-02-15 DIAGNOSIS — N179 Acute kidney failure, unspecified: Secondary | ICD-10-CM | POA: Diagnosis not present

## 2024-02-15 DIAGNOSIS — Z9225 Personal history of immunosupression therapy: Secondary | ICD-10-CM

## 2024-02-15 DIAGNOSIS — M069 Rheumatoid arthritis, unspecified: Secondary | ICD-10-CM | POA: Diagnosis not present

## 2024-02-15 DIAGNOSIS — L89899 Pressure ulcer of other site, unspecified stage: Secondary | ICD-10-CM | POA: Diagnosis not present

## 2024-02-15 NOTE — Progress Notes (Addendum)
 PROGRESS NOTE    [name]  FMW:[id number] DOB: [birth year] DOA: 02/07/2024 PCP: Glover Lenis, MD  Chief Complaint  Patient presents with   [facility]:  83 y.o. female with medical history significant for Hypothyroidism, rheumatoid arthritis on chronic prednisone and Renflexis  infusions, followed by rheumatology, osteoarthritis, PCP documentation of several falls requiring ED visits, and who currently uses a walker, who is being admitted for rhabdomyolysis after she was found down by a neighbor.  She was on the floor for over 24 hours. In the ED she had a low-grade temperature of 99.1 with otherwise normal vitals.  Labs showed normal CBC, Creatinine of 1.85 up from baseline of 1.1 to with bicarb of 19.  Mild LFT elevation with AST 43 and total bilirubin 2.7.  Troponin 17.  Urinalysis with many bacteria and small leuks.  EKG showed sinus arrhythmia at 88 CT head and C-spine without acute injury.  Chest x-ray nonacute showing multiple degenerative changes x-ray of the knee showing moderate right knee effusion among other findings-please see report. Patient treated with 2 L NS bolus and pain medicine.  Admission requested.   6/21.  CK down to 861 and creatinine down to 1.36.  Continue IV fluids.  PT evaluation.  Patient does not have any recollection of how she got to the hospital. 6/22.  CK down to 513 and creatinine down to 1.0.  Appreciate psychiatric consultation. 6/23.  TOC looking into rehabs.  Urine culture came back positive and started on Rocephin  6/24.  Awaiting rehab.  Rocephin  changed over to Keflex . 6/25- 6/28.  No changes.  Pending rehab.  Performed peer to peer 6/27  Subjective: No acute events overnight.  Patient is receiving shower on evaluation today.  She is moving well with mobility specialist  Objective: Vitals:   02/14/24 1954 02/15/24 0404 02/15/24 0739 02/15/24 1510  BP: (!) 173/79 (!) 125/53 109/62 (!) 124/45  Pulse: 92 92 79 89  Resp: 16 16 18  16   Temp: 99.2 F (37.3 C) 98.3 F (36.8 C) 98.6 F (37 C) 99.5 F (37.5 C)  TempSrc:      SpO2: 96% 96% 95% 99%  Weight:      Height:        Intake/Output Summary (Last 24 hours) at 02/15/2024 1525 Last data filed at 02/15/2024 1300 Gross per 24 hour  Intake 360 ml  Output --  Net 360 ml    Filed Weights   02/07/24 1842  Weight: 48.5 kg    Examination: General exam: Appears calm and comfortable, NAD, frail appearing Respiratory system: No work of breathing, symmetric chest wall expansion Neuro: Alert and oriented. No focal neurological deficits. Extremities: Symmetric, expected ROM Skin: No rashes, lesions Psychiatry: Demonstrates appropriate judgement and insight. Mood & affect appropriate for situation.   Assessment & Plan:  Principal Problem:   Rhabdomyolysis Active Problems:   AKI (acute kidney injury) (HCC)   On chronic immunosuppressive therapy   Multiple falls   Acute cystitis without hematuria   Acquired hypothyroidism   Rheumatoid arthritis involving multiple sites (HCC)   Memory loss   Pressure injury of skin   Protein-calorie malnutrition, severe    Rhabdomyolysis - CK peaked at 1195, has downtrended now - No further CK trend  AKI, resolved - Initial creatinine 1.85, has resolved to baseline - Renally dosed with a creatinine clearance of 41 when needed - As needed labs  Acute cystitis without hematuria - Urine culture with E. coli and Klebsiella, sensitive  to Keflex .  Status post antibiotic course.  Completed 6/27.    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  TOC working on APS report. - Patient to discharge to rehab when this is arranged  Rheumatoid arthritis of multiple sites On chronic immunosuppressive therapy - On methotrexate and folic acid  - Patient denies taking prednisone on a daily basis.  Will hold for now  Acquired  hypothyroidism - TSH elevated.  Levothyroxine  adjusted.  Continue at current dose. - Outpatient follow-up with PCP for continued monitoring and prescribing   Protein calorie malnutrition, severe - Continue supplements  Pressure injury of skin - Wound nurse consulted  Memory loss - Patient is demonstrating difficulty with short-term recall, also cannot report the events prior to this hospitalization - Workup revealed slightly elevated TSH, RPR nonreactive, HIV nonreactive, B12 within normal limits - Psychiatry has seen and reports she does have capacity to make decisions at this time. - Would benefit from further outpatient workup for neuropsych testing   DVT prophylaxis: Lovenox    Code Status: Full Code Disposition:  Inpatient pending rehab placement.  Medically clear for discharge when this has been arranged. Completed peer to peer on 6/27.  Awaiting updates  Consultants:    Procedures:    Antimicrobials:  Anti-infectives (From admission, onward)    Start     Dose/Rate Route Frequency Ordered Stop   02/11/24 1000  cephALEXin  (KEFLEX ) capsule 500 mg        500 mg Oral Every 12 hours 02/10/24 1435 02/13/24 2139   02/09/24 1330  cefTRIAXone  (ROCEPHIN ) 1 g in sodium chloride  0.9 % 100 mL IVPB  Status:  Discontinued        1 g 200 mL/hr over 30 Minutes Intravenous Every 24 hours 02/09/24 1239 02/10/24 1435       Data Reviewed: I have personally reviewed following labs and imaging studies CBC: Recent Labs  Lab 02/11/24 0421  WBC 3.9*  HGB 12.4  HCT 38.3  MCV 91.2  PLT 146*   Basic Metabolic Panel: Recent Labs  Lab 02/09/24 0134 02/11/24 0421  NA 139 138  K 3.4* 3.7  CL 110 103  CO2 24 28  GLUCOSE 75 80  BUN 32* 10  CREATININE 1.00 0.78  CALCIUM 7.7* 8.3*   GFR: Estimated Creatinine Clearance: 41.5 mL/min (by C-G formula based on SCr of 0.78 mg/dL). Liver Function Tests: No results for input(s): AST, ALT, ALKPHOS, BILITOT, PROT, ALBUMIN in  the last 168 hours.  CBG: No results for input(s): GLUCAP in the last 168 hours.  Recent Results (from the past 240 hours)  Urine Culture     Status: Abnormal   Collection Time: 02/07/24  8:36 PM   Specimen: Urine, Random  Result Value Ref Range Status   Specimen Description   Final    URINE, RANDOM Performed at [facility], [address] [place], [place] [address]    Special Requests   Final    NONE Reflexed from [phone] Performed at [facility], [address] [place], [place] [address]    Culture (A)  Final    >=100,000 COLONIES/mL ESCHERICHIA COLI 50,000 COLONIES/mL KLEBSIELLA PNEUMONIAE    Report Status 02/10/2024 FINAL  Final   Organism ID, Bacteria ESCHERICHIA COLI (A)  Final   Organism ID, Bacteria KLEBSIELLA PNEUMONIAE (A)  Final      Susceptibility   Escherichia coli - MIC*  AMPICILLIN 8 SENSITIVE Sensitive     CEFAZOLIN  <=4 SENSITIVE Sensitive     CEFEPIME <=0.12 SENSITIVE Sensitive     CEFTRIAXONE  <=0.25 SENSITIVE Sensitive     CIPROFLOXACIN <=0.25 SENSITIVE Sensitive     GENTAMICIN <=1 SENSITIVE Sensitive     IMIPENEM <=0.25 SENSITIVE Sensitive     NITROFURANTOIN <=16 SENSITIVE Sensitive     TRIMETH/SULFA <=20 SENSITIVE Sensitive     AMPICILLIN/SULBACTAM 4 SENSITIVE Sensitive     PIP/TAZO <=4 SENSITIVE Sensitive ug/mL    * >=100,000 COLONIES/mL ESCHERICHIA COLI   Klebsiella pneumoniae - MIC*    AMPICILLIN >=32 RESISTANT Resistant     CEFAZOLIN  <=4 SENSITIVE Sensitive     CEFEPIME <=0.12 SENSITIVE Sensitive     CEFTRIAXONE  <=0.25 SENSITIVE Sensitive     CIPROFLOXACIN <=0.25 SENSITIVE Sensitive     GENTAMICIN <=1 SENSITIVE Sensitive     IMIPENEM <=0.25 SENSITIVE Sensitive     NITROFURANTOIN 128 RESISTANT Resistant     TRIMETH/SULFA <=20 SENSITIVE Sensitive     AMPICILLIN/SULBACTAM 4 SENSITIVE Sensitive     PIP/TAZO <=4 SENSITIVE Sensitive ug/mL    * 50,000 COLONIES/mL KLEBSIELLA PNEUMONIAE     Radiology Studies: No results  found.  Scheduled Meds:  vitamin C   500 mg Oral BID   aspirin  EC  81 mg Oral Daily   cyanocobalamin   1,000 mcg Oral Daily   enoxaparin  (LOVENOX ) injection  40 mg Subcutaneous Q24H   feeding supplement  237 mL Oral TID BM   ferrous sulfate   325 mg Oral Daily   folic acid   1 mg Oral Daily   levothyroxine   88 mcg Oral Q0600   multivitamin with minerals  1 tablet Oral Daily   zinc  sulfate (50mg  elemental zinc )  220 mg Oral Daily   Continuous Infusions:   LOS: 7 days  MDM: Total time spent interpreting labs and vitals, reviewing the medical record, coordinating care amongst consultants and care team members, directly assessing and discussing care with the patient and/or family: 25 min  Lennix Kneisel, DO [facility]  To contact the attending physician between 7A-7P please use Epic Chat. To contact the covering physician during after hours 7P-7A, please review Amion.  02/15/2024, 3:25 PM   *This document has been created with the assistance of dictation software. Please excuse typographical errors. *

## 2024-02-15 NOTE — Plan of Care (Signed)

## 2024-02-15 NOTE — Plan of Care (Signed)

## 2024-02-15 NOTE — Progress Notes (Signed)
 Mobility Specialist - Progress Note     02/15/24 1137  Mobility  Activity Ambulated with assistance in hallway;Stood at bedside;Dangled on edge of bed;Off unit  Level of Assistance Standby assist, set-up cues, supervision of patient - no hands on  Assistive Device Front wheel walker  Distance Ambulated (ft) 30 ft  Range of Motion/Exercises Active  Activity Response Tolerated well  Mobility Referral Yes  Mobility visit 1 Mobility  Mobility Specialist Start Time (ACUTE ONLY) 1107  Mobility Specialist Stop Time (ACUTE ONLY) 1130  Mobility Specialist Time Calculation (min) (ACUTE ONLY) 23 min   Pt resting EOB upon entry on RA. Pt STS with RW and ambulates to chair around unit and outside medical mall area SBA wheelchair. Pt returned to bed and left with needs in reach and bed alarm activated.   Guido Rumble Mobility Specialist 02/15/24, 11:42 AM

## 2024-02-15 NOTE — Progress Notes (Signed)
 Mobility Specialist - Progress Note     02/15/24 1100  Mobility  Activity Ambulated with assistance in hallway;Stood at bedside;Dangled on edge of bed  Level of Assistance Standby assist, set-up cues, supervision of patient - no hands on  Assistive Device Front wheel walker  Distance Ambulated (ft) 15 ft  Range of Motion/Exercises Active  Activity Response Tolerated well  Mobility Referral Yes  Mobility visit 1 Mobility  Mobility Specialist Start Time (ACUTE ONLY) [id number]  Mobility Specialist Stop Time (ACUTE ONLY) 1022  Mobility Specialist Time Calculation (min) (ACUTE ONLY) 34 min   Pt resting in recliner upon entry. Pt STS and ambulates to bathroom SBA with RW. Pt performed assisted hygiene clean with MS. Pt STS x4 taking breaks between cleaning. RN took over to put patient in shower. Pt left with needs in reach sitting with RN on toilet.   Guido Rumble Mobility Specialist 02/15/24, 11:45 AM

## 2024-02-16 DIAGNOSIS — N179 Acute kidney failure, unspecified: Secondary | ICD-10-CM | POA: Diagnosis not present

## 2024-02-16 DIAGNOSIS — L89899 Pressure ulcer of other site, unspecified stage: Secondary | ICD-10-CM | POA: Diagnosis not present

## 2024-02-16 DIAGNOSIS — M069 Rheumatoid arthritis, unspecified: Secondary | ICD-10-CM | POA: Diagnosis not present

## 2024-02-16 DIAGNOSIS — M6282 Rhabdomyolysis: Secondary | ICD-10-CM | POA: Diagnosis not present

## 2024-02-16 NOTE — Plan of Care (Signed)

## 2024-02-16 NOTE — Progress Notes (Signed)
 Mobility Specialist - Progress Note     02/16/24 1500  Mobility  Activity Ambulated with assistance in hallway  Level of Assistance Contact guard assist, steadying assist  Assistive Device Front wheel walker  Distance Ambulated (ft) 22 ft  Range of Motion/Exercises Active  Activity Response Tolerated well  Mobility Referral Yes  Mobility visit 1 Mobility  Mobility Specialist Start Time (ACUTE ONLY) 1504  Mobility Specialist Stop Time (ACUTE ONLY) 1519  Mobility Specialist Time Calculation (min) (ACUTE ONLY) 15 min   Pt resting in bed on RA upon entry. Pt STS and ambulates to hallway out to NS. Pt endorses pain in feet (RN notified). Pt took x1 seated rest break and ambulates back to bed. Pt left with needs in reach and bed alarm activated.   Guido Rumble Mobility Specialist 02/16/24, 3:27 PM

## 2024-02-16 NOTE — Progress Notes (Signed)
 PROGRESS NOTE    [name]  FMW:[id number] DOB: 1940/08/25 DOA: 02/07/2024 PCP: Glover Lenis, MD  Chief Complaint  Patient presents with   [facility]:  83 y.o. female with medical history significant for Hypothyroidism, rheumatoid arthritis on chronic prednisone and Renflexis  infusions, followed by rheumatology, osteoarthritis, PCP documentation of several falls requiring ED visits, and who currently uses a walker, who is being admitted for rhabdomyolysis after she was found down by a neighbor.  She was on the floor for over 24 hours. In the ED she had a low-grade temperature of 99.1 with otherwise normal vitals.  Labs showed normal CBC, Creatinine of 1.85 up from baseline of 1.1 to with bicarb of 19.  Mild LFT elevation with AST 43 and total bilirubin 2.7.  Troponin 17.  Urinalysis with many bacteria and small leuks.  EKG showed sinus arrhythmia at 88 CT head and C-spine without acute injury.  Chest x-ray nonacute showing multiple degenerative changes x-ray of the knee showing moderate right knee effusion among other findings-please see report. Patient treated with 2 L NS bolus and pain medicine.  Admission requested.   6/21.  CK down to 861 and creatinine down to 1.36.  Continue IV fluids.  PT evaluation.  Patient does not have any recollection of how she got to the hospital. 6/22.  CK down to 513 and creatinine down to 1.0.  Appreciate psychiatric consultation. 6/23.  TOC looking into rehabs.  Urine culture came back positive and started on Rocephin  6/24.  Awaiting rehab.  Rocephin  changed over to Keflex . 6/25- 6/29.  No changes.  Pending rehab.  Performed peer to peer 6/27  Subjective: No acute events overnight.  Patient has no complaints this morning.  She is sitting up in bed watching TV easily.  She is anxious to discharge to rehab.  Objective: Vitals:   02/15/24 1955 02/16/24 0359 02/16/24 0835 02/16/24 1537  BP: (!) 111/53 (!) 107/45 (!) 114/57 (!) 119/50   Pulse: 100 74 84 95  Resp:   19 18  Temp: 99.7 F (37.6 C) 98.5 F (36.9 C) 99.5 F (37.5 C) 98.6 F (37 C)  TempSrc:      SpO2: 97% 96% 96% 96%  Weight:      Height:        Intake/Output Summary (Last 24 hours) at 02/16/2024 1616 Last data filed at 02/15/2024 2003 Gross per 24 hour  Intake 240 ml  Output --  Net 240 ml    Filed Weights   02/07/24 1842  Weight: 48.5 kg    Examination: General exam: Appears calm and comfortable, NAD, frail appearing Respiratory system: No work of breathing, symmetric chest wall expansion Neuro: Alert and oriented. No focal neurological deficits. Extremities: Symmetric, expected ROM Skin: No rashes, lesions Psychiatry: Demonstrates appropriate judgement and insight. Mood & affect appropriate for situation.   Assessment & Plan:  Principal Problem:   Rhabdomyolysis Active Problems:   AKI (acute kidney injury) (HCC)   On chronic immunosuppressive therapy   Multiple falls   Acute cystitis without hematuria   Acquired hypothyroidism   Rheumatoid arthritis involving multiple sites (HCC)   Memory loss   Pressure injury of skin   Protein-calorie malnutrition, severe    Rhabdomyolysis - CK peaked at 1195, has downtrended now - No further CK trend  AKI, resolved - Initial creatinine 1.85, has resolved to baseline - Renally dosed with a creatinine clearance of 41 when needed - As needed labs  Acute cystitis without  hematuria - Urine culture with E. coli and Klebsiella, sensitive to Keflex .  Status post antibiotic course.  Completed 6/27.    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  TOC working on APS report. - Patient to discharge to rehab when this is arranged  Rheumatoid arthritis of multiple sites On chronic immunosuppressive therapy - On methotrexate and folic acid  - Patient denies taking prednisone on a daily  basis.  Will hold for now  Acquired hypothyroidism - TSH elevated.  Levothyroxine  adjusted.  Continue at current dose. - Outpatient follow-up with PCP for continued monitoring and prescribing   Protein calorie malnutrition, severe - Continue supplements  Pressure injury of skin - Wound nurse consulted  Memory loss - Patient is demonstrating difficulty with short-term recall, also cannot report the events prior to this hospitalization - Workup revealed slightly elevated TSH, RPR nonreactive, HIV nonreactive, B12 within normal limits - Psychiatry has seen and reports she does have capacity to make decisions at this time. - Would benefit from further outpatient workup for neuropsych testing   DVT prophylaxis: Lovenox    Code Status: Full Code Disposition:  Inpatient pending rehab placement.  Medically clear for discharge when this has been arranged.  Completed peer to peer on 6/27.  Awaiting updates  Consultants:    Procedures:    Antimicrobials:  Anti-infectives (From admission, onward)    Start     Dose/Rate Route Frequency Ordered Stop   02/11/24 1000  cephALEXin  (KEFLEX ) capsule 500 mg        500 mg Oral Every 12 hours 02/10/24 1435 02/13/24 2139   02/09/24 1330  cefTRIAXone  (ROCEPHIN ) 1 g in sodium chloride  0.9 % 100 mL IVPB  Status:  Discontinued        1 g 200 mL/hr over 30 Minutes Intravenous Every 24 hours 02/09/24 1239 02/10/24 1435       Data Reviewed: I have personally reviewed following labs and imaging studies CBC: Recent Labs  Lab 02/11/24 0421  WBC 3.9*  HGB 12.4  HCT 38.3  MCV 91.2  PLT 146*   Basic Metabolic Panel: Recent Labs  Lab 02/11/24 0421  NA 138  K 3.7  CL 103  CO2 28  GLUCOSE 80  BUN 10  CREATININE 0.78  CALCIUM 8.3*   GFR: Estimated Creatinine Clearance: 41.5 mL/min (by C-G formula based on SCr of 0.78 mg/dL). Liver Function Tests: No results for input(s): AST, ALT, ALKPHOS, BILITOT, PROT, ALBUMIN in the last 168  hours.  CBG: No results for input(s): GLUCAP in the last 168 hours.  Recent Results (from the past 240 hours)  Urine Culture     Status: Abnormal   Collection Time: 02/07/24  8:36 PM   Specimen: Urine, Random  Result Value Ref Range Status   Specimen Description   Final    URINE, RANDOM Performed at [facility], [address] [place], [place] [address]    Special Requests   Final    NONE Reflexed from [id number] Performed at [facility], [address] [place], [place] [address]    Culture (A)  Final    >=100,000 COLONIES/mL ESCHERICHIA COLI 50,000 COLONIES/mL KLEBSIELLA PNEUMONIAE    Report Status 02/10/2024 FINAL  Final   Organism ID, Bacteria ESCHERICHIA COLI (A)  Final   Organism ID, Bacteria KLEBSIELLA PNEUMONIAE (A)  Final      Susceptibility   Escherichia coli - MIC*  AMPICILLIN 8 SENSITIVE Sensitive     CEFAZOLIN  <=4 SENSITIVE Sensitive     CEFEPIME <=0.12 SENSITIVE Sensitive     CEFTRIAXONE  <=0.25 SENSITIVE Sensitive     CIPROFLOXACIN <=0.25 SENSITIVE Sensitive     GENTAMICIN <=1 SENSITIVE Sensitive     IMIPENEM <=0.25 SENSITIVE Sensitive     NITROFURANTOIN <=16 SENSITIVE Sensitive     TRIMETH/SULFA <=20 SENSITIVE Sensitive     AMPICILLIN/SULBACTAM 4 SENSITIVE Sensitive     PIP/TAZO <=4 SENSITIVE Sensitive ug/mL    * >=100,000 COLONIES/mL ESCHERICHIA COLI   Klebsiella pneumoniae - MIC*    AMPICILLIN >=32 RESISTANT Resistant     CEFAZOLIN  <=4 SENSITIVE Sensitive     CEFEPIME <=0.12 SENSITIVE Sensitive     CEFTRIAXONE  <=0.25 SENSITIVE Sensitive     CIPROFLOXACIN <=0.25 SENSITIVE Sensitive     GENTAMICIN <=1 SENSITIVE Sensitive     IMIPENEM <=0.25 SENSITIVE Sensitive     NITROFURANTOIN 128 RESISTANT Resistant     TRIMETH/SULFA <=20 SENSITIVE Sensitive     AMPICILLIN/SULBACTAM 4 SENSITIVE Sensitive     PIP/TAZO <=4 SENSITIVE Sensitive ug/mL    * 50,000 COLONIES/mL KLEBSIELLA PNEUMONIAE     Radiology Studies: No results  found.  Scheduled Meds:  vitamin C   500 mg Oral BID   aspirin  EC  81 mg Oral Daily   cyanocobalamin   1,000 mcg Oral Daily   enoxaparin  (LOVENOX ) injection  40 mg Subcutaneous Q24H   feeding supplement  237 mL Oral TID BM   ferrous sulfate   325 mg Oral Daily   folic acid   1 mg Oral Daily   levothyroxine   88 mcg Oral Q0600   multivitamin with minerals  1 tablet Oral Daily   zinc  sulfate (50mg  elemental zinc )  220 mg Oral Daily   Continuous Infusions:   LOS: 8 days  MDM: Total time spent interpreting labs and vitals, reviewing the medical record, coordinating care amongst consultants and care team members, directly assessing and discussing care with the patient and/or family: 25 min  Aymee Fomby, DO [facility]  To contact the attending physician between 7A-7P please use Epic Chat. To contact the covering physician during after hours 7P-7A, please review Amion.  02/16/2024, 4:16 PM   *This document has been created with the assistance of dictation software. Please excuse typographical errors. *

## 2024-02-16 NOTE — Plan of Care (Signed)

## 2024-02-17 ENCOUNTER — Inpatient Hospital Stay

## 2024-02-17 DIAGNOSIS — Z9225 Personal history of immunosupression therapy: Secondary | ICD-10-CM | POA: Diagnosis not present

## 2024-02-17 DIAGNOSIS — M069 Rheumatoid arthritis, unspecified: Secondary | ICD-10-CM | POA: Diagnosis not present

## 2024-02-17 DIAGNOSIS — R296 Repeated falls: Secondary | ICD-10-CM | POA: Diagnosis not present

## 2024-02-17 DIAGNOSIS — E43 Unspecified severe protein-calorie malnutrition: Secondary | ICD-10-CM | POA: Diagnosis not present

## 2024-02-17 LAB — CBC WITH DIFFERENTIAL/PLATELET
Abs Immature Granulocytes: 0.02 10*3/uL (ref 0.00–0.07)
Basophils Absolute: 0 10*3/uL (ref 0.0–0.1)
Basophils Relative: 1 %
Eosinophils Absolute: 0 10*3/uL (ref 0.0–0.5)
Eosinophils Relative: 1 %
HCT: 38.1 % (ref 36.0–46.0)
Hemoglobin: 12.3 g/dL (ref 12.0–15.0)
Immature Granulocytes: 1 %
Lymphocytes Relative: 19 %
Lymphs Abs: 0.7 10*3/uL (ref 0.7–4.0)
MCH: 30.1 pg (ref 26.0–34.0)
MCHC: 32.3 g/dL (ref 30.0–36.0)
MCV: 93.2 fL (ref 80.0–100.0)
Monocytes Absolute: 0.3 10*3/uL (ref 0.1–1.0)
Monocytes Relative: 9 %
Neutro Abs: 2.5 10*3/uL (ref 1.7–7.7)
Neutrophils Relative %: 69 %
Platelets: 212 10*3/uL (ref 150–400)
RBC: 4.09 MIL/uL (ref 3.87–5.11)
RDW: 14.8 % (ref 11.5–15.5)
WBC: 3.5 10*3/uL — ABNORMAL LOW (ref 4.0–10.5)
nRBC: 0 % (ref 0.0–0.2)

## 2024-02-17 LAB — RESPIRATORY PANEL BY PCR

## 2024-02-17 LAB — COMPREHENSIVE METABOLIC PANEL WITH GFR
ALT: 17 U/L (ref 0–44)
AST: 32 U/L (ref 15–41)
Albumin: 2.7 g/dL — ABNORMAL LOW (ref 3.5–5.0)
Alkaline Phosphatase: 46 U/L (ref 38–126)
Anion gap: 10 (ref 5–15)
BUN: 21 mg/dL (ref 8–23)
CO2: 28 mmol/L (ref 22–32)
Calcium: 8.5 mg/dL — ABNORMAL LOW (ref 8.9–10.3)
Chloride: 95 mmol/L — ABNORMAL LOW (ref 98–111)
Creatinine, Ser: 0.8 mg/dL (ref 0.44–1.00)
GFR, Estimated: >60 mL/min (ref >60)
Glucose, Bld: 99 mg/dL (ref 70–99)
Potassium: 3.9 mmol/L (ref 3.5–5.1)
Sodium: 133 mmol/L — ABNORMAL LOW (ref 135–145)
Total Bilirubin: 0.7 mg/dL (ref 0.0–1.2)
Total Protein: 6.4 g/dL — ABNORMAL LOW (ref 6.5–8.1)

## 2024-02-17 LAB — RESP PANEL BY RT-PCR (RSV, FLU A&B, COVID)  RVPGX2
Influenza A by PCR: NEGATIVE
Influenza B by PCR: NEGATIVE
Resp Syncytial Virus by PCR: NEGATIVE
SARS Coronavirus 2 by RT PCR: POSITIVE — AB

## 2024-02-17 LAB — AMMONIA: Ammonia: <13 umol/L (ref 9–35)

## 2024-02-17 NOTE — Progress Notes (Signed)
 Physical Therapy Treatment Patient Details Name: [name] MRN: [id number] DOB: 08-08-1941 Today's Date: 02/17/2024   History of Present Illness [name] is a 83 y.o. female with medical history significant for Hypothyroidism, rheumatoid arthritis on chronic prednisone and Renflexis  infusions, followed by rheumatology, osteoarthritis, PCP documentation of several falls requiring ED visits, and who currently uses a walker, who is being admitted for rhabdomyolysis after she was found down by a neighbor.  She was on the floor for over 24 hours.    PT Comments  Of note, per chart review pt with increased lethargy earlier this date with stat head CT ordered. Per CT impression: Age-related atrophy and a mild to moderate cerebral white matter disease. No apparent acute process or significant interval change.  Contacted MD who stated pt more alert this afternoon and gave ok for pt to participate with PT services without restriction.  Pt was alert and pleasant upon entering room and motivated to participate with PT services.  Pt able to come to sitting without assist and required only min A to scoot forward to get her feet on the floor.  Pt required no physical assist to come to standing but did require significant effort and cues for hand placement.  Pt participated in two bouts of static standing with weight shifting left/right and several small marches in place each for 2-3 min with seated rest break between bouts.  Pt was then able to perform two bouts of ambulation room distances per below with slow, effortful cadence but with no overt LOB.  Pt did present with overall decreased functional strength grossly compared to prior sessions as well as decreased activity tolerance with gait but reported no adverse symptoms during the session with SpO2 and HR WNL throughout on room air.  Pt will benefit from continued PT services upon discharge to safely address deficits listed in patient problem list for  decreased caregiver assistance and eventual return to PLOF.     If plan is discharge home, recommend the following: Assistance with cooking/housework;A little help with bathing/dressing/bathroom;A little help with walking and/or transfers;Supervision due to cognitive status;Assist for transportation;Help with stairs or ramp for entrance   Can travel by private vehicle     Yes  Equipment Recommendations  BSC/3in1    Recommendations for Other Services       Precautions / Restrictions Precautions Precautions: Fall Recall of Precautions/Restrictions: Impaired Restrictions Weight Bearing Restrictions Per Provider Order: No     Mobility  Bed Mobility Overal bed mobility: Needs Assistance       Supine to sit: Min assist     General bed mobility comments: Min A to scoot to the EOB once in sitting    Transfers Overall transfer level: Needs assistance Equipment used: Rolling walker (2 wheels) Transfers: Sit to/from Stand Sit to Stand: Contact guard assist           General transfer comment: Min verbal cues for hand placement    Ambulation/Gait Ambulation/Gait assistance: Contact guard assist Gait Distance (Feet): 20 Feet x 1, 10 Feet x 1 Assistive device: Rolling walker (2 wheels) Gait Pattern/deviations: Step-through pattern, Decreased step length - right, Decreased step length - left, Trunk flexed Gait velocity: decreased     General Gait Details: Slow cadence with short, effortful steps and mod lean on the RW for support but steady without overt LOB   Optometrist     Tilt Bed  Modified Rankin (Stroke Patients Only)       Balance Overall balance assessment: Needs assistance   Sitting balance-Leahy Scale: Good     Standing balance support: Bilateral upper extremity supported, During functional activity, Reliant on assistive device for balance Standing balance-Leahy Scale: Fair Standing balance comment: Heavy lean  on the RW for support                            Communication Communication Communication: No apparent difficulties  Cognition Arousal: Alert Behavior During Therapy: WFL for tasks assessed/performed   PT - Cognitive impairments: No apparent impairments                         Following commands: Intact      Cueing Cueing Techniques: Tactile cues, Gestural cues, Verbal cues  Exercises Other Exercises Other Exercises: Static standing at EOB 2 x 3 min for improved activity tolerance Other Exercises: Multiple sit to/from stands x 4 with cues for controlled eccentric phase    General Comments        Pertinent Vitals/Pain Pain Assessment Pain Assessment: No/denies pain    Home Living                          Prior Function            PT Goals (current goals can now be found in the care plan section) Progress towards PT goals: PT to reassess next treatment    Frequency    Min 2X/week      PT Plan      Co-evaluation              AM-PAC PT 6 Clicks Mobility   Outcome Measure  Help needed turning from your back to your side while in a flat bed without using bedrails?: A Little Help needed moving from lying on your back to sitting on the side of a flat bed without using bedrails?: A Little Help needed moving to and from a bed to a chair (including a wheelchair)?: A Little Help needed standing up from a chair using your arms (e.g., wheelchair or bedside chair)?: A Little Help needed to walk in hospital room?: A Little Help needed climbing 3-5 steps with a railing? : A Lot 6 Click Score: 17    End of Session Equipment Utilized During Treatment: Gait belt Activity Tolerance: Patient tolerated treatment well Patient left: in chair;with call bell/phone within reach;with chair alarm set;Other (comment) (Pressure relief pad in place) Nurse Communication: Mobility status PT Visit Diagnosis: Other abnormalities of gait and  mobility (R26.89);Muscle weakness (generalized) (M62.81);Repeated falls (R29.6);History of falling (Z91.81);Unsteadiness on feet (R26.81)     Time: 8472-8443 PT Time Calculation (min) (ACUTE ONLY): 29 min  Charges:    $Gait Training: 8-22 mins $Therapeutic Activity: 8-22 mins PT General Charges $$ ACUTE PT VISIT: 1 Visit                     D. Scott Steffie Waggoner PT, DPT 02/17/24, 5:24 PM

## 2024-02-17 NOTE — Progress Notes (Signed)
 PROGRESS NOTE    [name]  FMW:[id number] DOB: 01/30/1941 DOA: 02/07/2024 PCP: Glover Lenis, MD  Chief Complaint  Patient presents with   [facility] Course:  83 y.o. female with medical history significant for Hypothyroidism, rheumatoid arthritis on chronic prednisone and Renflexis  infusions, followed by rheumatology, osteoarthritis, PCP documentation of several falls requiring ED visits, and who currently uses a walker, who is being admitted for rhabdomyolysis after she was found down by a neighbor.  She was on the floor for over 24 hours. In the ED she had a low-grade temperature of 99.1 with otherwise normal vitals.  Labs showed normal CBC, Creatinine of 1.85 up from baseline of 1.1 to with bicarb of 19.  Mild LFT elevation with AST 43 and total bilirubin 2.7.  Troponin 17.  Urinalysis with many bacteria and small leuks.  EKG showed sinus arrhythmia at 88 CT head and C-spine without acute injury.  Chest x-ray nonacute showing multiple degenerative changes x-ray of the knee showing moderate right knee effusion among other findings-please see report. Patient treated with 2 L NS bolus and pain medicine.  Admission requested.   6/21.  CK down to 861 and creatinine down to 1.36.  Continue IV fluids.  PT evaluation.  Patient does not have any recollection of how she got to the hospital. 6/22.  CK down to 513 and creatinine down to 1.0.  Appreciate psychiatric consultation. 6/23.  TOC looking into rehabs.  Urine culture came back positive and started on Rocephin  6/24.  Awaiting rehab.  Rocephin  changed over to Keflex . 6/25- 6/29.  No changes.  Pending rehab.  Performed peer to peer 6/27 6/30: Insurance denied rehab.  Likely home with home health.  Has APS case opened.  Following up.  Subjective: On my initial evaluation today patient was alert and oriented.  Earlier in the morning mobility specialist reported patient seemed weaker than normal. Was called to to bedside later on as  patient had increased somnolence.  She was very drowsy at that time.  Stat head CT was performed which was without acute abnormality.  Patient's mental status improved later in the afternoon and she is regaining her normal levels of alertness now.   Objective: Vitals:   02/16/24 2012 02/17/24 0333 02/17/24 0814 02/17/24 0900  BP: (!) 108/50 (!) 129/53 130/64 (!) 121/54  Pulse: 92 92 86   Resp:  16 18   Temp: 100 F (37.8 C) 98.6 F (37 C) 99.4 F (37.4 C)   TempSrc:   Oral   SpO2: 96% 94% 95%   Weight:      Height:        Intake/Output Summary (Last 24 hours) at 02/17/2024 1545 Last data filed at 02/16/2024 1900 Gross per 24 hour  Intake 0 ml  Output --  Net 0 ml    Filed Weights   02/07/24 1842  Weight: 48.5 kg    Examination: General exam: Appears calm and comfortable, NAD, frail appearing Respiratory system: No work of breathing, symmetric chest wall expansion Neuro: Alert and oriented. No focal neurological deficits. Extremities: Symmetric, expected ROM Skin: No rashes, lesions Psychiatry: Demonstrates appropriate judgement and insight. Mood & affect appropriate for situation.   Assessment & Plan:  Principal Problem:   Rhabdomyolysis Active Problems:   AKI (acute kidney injury) (HCC)   On chronic immunosuppressive therapy   Multiple falls   Acute cystitis without hematuria   Acquired hypothyroidism   Rheumatoid arthritis involving multiple sites [facility])   Memory  loss   Pressure injury of skin   Protein-calorie malnutrition, severe     Excessive somnolence - Waxing and waning today.  Patient appears to be at her baseline now. - Head CT performed earlier today when she was excessively somnolent.  It is without acute abnormality - Will continue to monitor mental status closely.  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  TOC working on APS report. - Initially plan to discharge patient to rehab but insurance has denied this (peer to peer personally completed 6/27).  TOC you have reaching out directly today for update on APS case to ensure safe discharge plan available.  Will likely need to go home with home health.  Rheumatoid arthritis of multiple sites On chronic immunosuppressive therapy - On methotrexate and folic acid  - Patient denies taking prednisone on a daily basis.  Will hold for now  Rhabdomyolysis - CK peaked at 1195, has downtrended now - No further CK trend  AKI, resolved - Initial creatinine 1.85, has resolved to baseline - Continue to renally dosed with a creatinine clearance of 41 when needed  Acute cystitis without hematuria - Urine culture with E. coli and Klebsiella, sensitive to Keflex .  Status post antibiotic course.  Completed 6/27.    Acquired hypothyroidism - TSH elevated.  Levothyroxine  adjusted.  Continue at current dose. - Outpatient follow-up with PCP for continued monitoring and prescribing   Protein calorie malnutrition, severe - Continue supplements  Pressure injury of skin - Wound nurse consulted - Daily wound care per RNs  Memory loss - Patient is demonstrating difficulty with short-term recall, also cannot report the events prior to this hospitalization.  Suspect some degree of underlying dementia.  Would benefit from further neuropsych testing outpatient for true diagnosis. - Mental status continues to wax and wane with increased somnolence today.  Workup as above. - Workup revealed slightly elevated TSH, RPR nonreactive, HIV nonreactive, B12 within normal limits.  CT earlier this admission and today without acute intracranial abnormality.  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      - Psychiatry has seen and reports she does have capacity to make decisions at this time. - Would benefit from further outpatient workup for neuropsych  testing   DVT prophylaxis: Lovenox    Code Status: Full Code Disposition: Medically stable for discharge, no safe dispo plan at this time Consultants:    Procedures:    Antimicrobials:  Anti-infectives (From admission, onward)    Start     Dose/Rate Route Frequency Ordered Stop   02/11/24 1000  cephALEXin  (KEFLEX ) capsule 500 mg        500 mg Oral Every 12 hours 02/10/24 1435 02/13/24 2139   02/09/24 1330  cefTRIAXone  (ROCEPHIN ) 1 g in sodium chloride  0.9 % 100 mL IVPB  Status:  Discontinued        1 g 200 mL/hr over 30 Minutes Intravenous Every 24 hours 02/09/24 1239 02/10/24 1435       Data Reviewed: I have personally reviewed following labs and imaging studies CBC: Recent Labs  Lab 02/11/24 0421  WBC 3.9*  HGB 12.4  HCT 38.3  MCV 91.2  PLT 146*   Basic Metabolic Panel: Recent Labs  Lab 02/11/24 0421  NA 138  K 3.7  CL 103  CO2 28  GLUCOSE 80  BUN 10  CREATININE 0.78  CALCIUM 8.3*  GFR: Estimated Creatinine Clearance: 41.5 mL/min (by C-G formula based on SCr of 0.78 mg/dL). Liver Function Tests: No results for input(s): AST, ALT, ALKPHOS, BILITOT, PROT, ALBUMIN in the last 168 hours.  CBG: No results for input(s): GLUCAP in the last 168 hours.  Recent Results (from the past 240 hours)  Urine Culture     Status: Abnormal   Collection Time: 02/07/24  8:36 PM   Specimen: Urine, Random  Result Value Ref Range Status   Specimen Description   Final    URINE, RANDOM Performed at [facility], [address] [place], [place] [address]    Special Requests   Final    NONE Reflexed from [id number] Performed at [facility], [address] [place], [place] [address]    Culture (A)  Final    >=100,000 COLONIES/mL ESCHERICHIA COLI 50,000 COLONIES/mL KLEBSIELLA PNEUMONIAE    Report Status 02/10/2024 FINAL  Final   Organism ID, Bacteria ESCHERICHIA COLI (A)  Final   Organism ID, Bacteria KLEBSIELLA PNEUMONIAE (A)  Final       Susceptibility   Escherichia coli - MIC*    AMPICILLIN 8 SENSITIVE Sensitive     CEFAZOLIN  <=4 SENSITIVE Sensitive     CEFEPIME <=0.12 SENSITIVE Sensitive     CEFTRIAXONE  <=0.25 SENSITIVE Sensitive     CIPROFLOXACIN <=0.25 SENSITIVE Sensitive     GENTAMICIN <=1 SENSITIVE Sensitive     IMIPENEM <=0.25 SENSITIVE Sensitive     NITROFURANTOIN <=16 SENSITIVE Sensitive     TRIMETH/SULFA <=20 SENSITIVE Sensitive     AMPICILLIN/SULBACTAM 4 SENSITIVE Sensitive     PIP/TAZO <=4 SENSITIVE Sensitive ug/mL    * >=100,000 COLONIES/mL ESCHERICHIA COLI   Klebsiella pneumoniae - MIC*    AMPICILLIN >=32 RESISTANT Resistant     CEFAZOLIN  <=4 SENSITIVE Sensitive     CEFEPIME <=0.12 SENSITIVE Sensitive     CEFTRIAXONE  <=0.25 SENSITIVE Sensitive     CIPROFLOXACIN <=0.25 SENSITIVE Sensitive     GENTAMICIN <=1 SENSITIVE Sensitive     IMIPENEM <=0.25 SENSITIVE Sensitive     NITROFURANTOIN 128 RESISTANT Resistant     TRIMETH/SULFA <=20 SENSITIVE Sensitive     AMPICILLIN/SULBACTAM 4 SENSITIVE Sensitive     PIP/TAZO <=4 SENSITIVE Sensitive ug/mL    * 50,000 COLONIES/mL KLEBSIELLA PNEUMONIAE     Radiology Studies: CT HEAD WO CONTRAST ( ) Result Date: 02/17/2024 CLINICAL DATA:  Mental status change, unknown cause EXAM: CT HEAD WITHOUT CONTRAST TECHNIQUE: Contiguous axial images were obtained from the base of the skull through the vertex without intravenous contrast. RADIATION DOSE REDUCTION: This exam was performed according to the departmental dose-optimization program which includes automated exposure control, adjustment of the mA and/or kV according to patient size and/or use of iterative reconstruction technique. COMPARISON:  CT of the head dated February 07, 2024. FINDINGS: Brain: Study is mildly degraded by patient motion. There is age-related cerebral and cerebellar volume loss present. There is mild to moderate cerebral white matter disease. 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  No evidence of hemorrhage, mass or acute cortical infarct. Vascular: Mild calcific atheromatous disease. Skull: Intact and unremarkable. Sinuses/Orbits: Paranasal sinuses are clear. The orbits appear normal. Other: None. IMPRESSION: 1. Age-related atrophy and a mild to moderate cerebral white matter disease. No apparent acute process or significant interval change. Electronically Signed   By: Evalene Coho M.D.   On: 02/17/2024 14:08    Scheduled Meds:  vitamin C   500 mg Oral BID   aspirin  EC  81 mg Oral Daily   cyanocobalamin   1,000 mcg Oral Daily   enoxaparin  (LOVENOX ) injection  40 mg Subcutaneous Q24H   feeding supplement  237 mL Oral TID BM   ferrous sulfate   325 mg Oral Daily   folic acid   1 mg Oral Daily   levothyroxine   88 mcg Oral Q0600   multivitamin with minerals  1 tablet Oral Daily   zinc  sulfate (50mg  elemental zinc )  220 mg Oral Daily   Continuous Infusions:   LOS: 9 days  MDM: Total time spent interpreting labs and vitals, reviewing the medical record, coordinating care amongst consultants and care team members, directly assessing and discussing care with the patient and/or family: 25 min  Ariyannah Pauling, DO [facility]  To contact the attending physician between 7A-7P please use Epic Chat. To contact the covering physician during after hours 7P-7A, please review Amion.  02/17/2024, 3:45 PM   *This document has been created with the assistance of dictation software. Please excuse typographical errors. *

## 2024-02-17 NOTE — Plan of Care (Signed)

## 2024-02-17 NOTE — TOC Progression Note (Addendum)
 Transition of Care [facility]) - Progression Note    Patient Details  Name: [name] MRN: [id number] Date of Birth: 04/21/1941  [facility] CM/SW Contact  Dalia GORMAN Fuse, RN Phone Number: 02/17/2024, 3:02 PM  Clinical Narrative:     TOC received message from UR nurse advising fax was received with SNF denial. TOC spoke with the patient to share denial. The patient wishes to go home with home health. TOC outreached to Assigned APS SW :Vanessa Cable [phone]  and  Supervisor Elease Molly [phone] and requested call back to determine if patient can be discharged home.        Expected Discharge Plan and Services                                               Social Determinants of Health (SDOH) Interventions SDOH Screenings   Food Insecurity: Patient Unable To Answer (02/08/2024)  Housing: Unknown (02/08/2024)  Transportation Needs: Patient Unable To Answer (02/08/2024)  Utilities: Patient Unable To Answer (02/08/2024)  Financial Resource Strain: Low Risk  (01/25/2023)   Received from [facility]  Social Connections: Patient Unable To Answer (02/08/2024)  Tobacco Use: Low Risk  (02/07/2024)    Readmission Risk Interventions     No data to display

## 2024-02-17 NOTE — Plan of Care (Signed)
  Problem: Clinical Measurements: Goal: Cardiovascular complication will be avoided Outcome: Progressing   Problem: Nutrition: Goal: Adequate nutrition will be maintained Outcome: Progressing   Problem: Elimination: Goal: Will not experience complications related to bowel motility Outcome: Progressing Goal: Will not experience complications related to urinary retention Outcome: Progressing   Problem: Pain Managment: Goal: General experience of comfort will improve and/or be controlled Outcome: Progressing   Problem: Safety: Goal: Ability to remain free from injury will improve Outcome: Progressing   Problem: Skin Integrity: Goal: Risk for impaired skin integrity will decrease Outcome: Progressing

## 2024-02-17 NOTE — Progress Notes (Addendum)
 Mobility Specialist - Progress Note    02/17/24 0900  Mobility  Activity Stood at bedside;Dangled on edge of bed;Turned to right side;Turned to left side  Level of Assistance Contact guard assist, steadying assist  Assistive Device Four wheel walker  Range of Motion/Exercises Active  Activity Response Tolerated well  Mobility Referral Yes  Mobility visit 1 Mobility  Mobility Specialist Start Time (ACUTE ONLY) 0935  Mobility Specialist Stop Time (ACUTE ONLY) 0950  Mobility Specialist Time Calculation (min) (ACUTE ONLY) 15 min   Pt resting in bed on RA upon entry. Pt STS x1 and sat back down EOB for 5 minutes. Pt had a hard time holding herself up EOB denied dizziness and seeing stars but, had a hard time keeping her eyes open and was leaning over alot. (RN Notified) BP recorded 121/54. Pt returned to bed to perform hygiene clean and NT took over with assistance. Pt left with needs in reach.   Guido Rumble Mobility Specialist 02/17/24, 10:20 AM

## 2024-02-17 NOTE — Progress Notes (Signed)
 Occupational Therapy Treatment Patient Details Name: [name] MRN: [id number] DOB: 1941/07/28 Today's Date: 02/17/2024   History of present illness [name] is a 83 y.o. female with medical history significant for Hypothyroidism, rheumatoid arthritis on chronic prednisone and Renflexis  infusions, followed by rheumatology, osteoarthritis, PCP documentation of several falls requiring ED visits, and who currently uses a walker, who is being admitted for rhabdomyolysis after she was found down by a neighbor.  She was on the floor for over 24 hours.   OT comments  Pt seen today for OT tx. Secure chat to MD and RN regarding potential change in mental status. Pt asleep upon arrival, awakens to touch and tells this author her name when asked. Pt lethargic, immediately falling back asleep. Pt placed upright in bed to attempt increased arousal, BP assessed 114/50 (65) HR 88. When provided washcloth for bed level grooming tasks, pt does not initiate grasp but mumbles yes, that would be nice when asked to wash face. TOTAL A to wash face and comb hair, MAX A to roll to R side to check for cleanliness and reposition pt for pressure relief. Per chart review and discussion with mobility specialist familiar with pt - pt was walking in hallway yesterday and pt appears to have a significant change in status. OT will continue to progress towards functional gains and update discharge recommendations as appropriate.       If plan is discharge home, recommend the following:  A lot of help with walking and/or transfers;A lot of help with bathing/dressing/bathroom;Direct supervision/assist for financial management;Direct supervision/assist for medications management;Assist for transportation   Equipment Recommendations  Other (comment)       Precautions / Restrictions Precautions Precautions: Fall Recall of Precautions/Restrictions: Impaired Restrictions Weight Bearing Restrictions Per Provider Order: No        Mobility Bed Mobility Overal bed mobility: Needs Assistance Bed Mobility: Rolling Rolling: Max assist         General bed mobility comments: pt rolled to R side with MAX A to check for cleanliness, initiated reaching with LUE    Transfers Overall transfer level: Needs assistance                 General transfer comment: Unsafe to attempt.     Balance Overall balance assessment: Needs assistance     Sitting balance - Comments: NT       Standing balance comment: NT                           ADL either performed or assessed with clinical judgement   ADL Overall ADL's : Needs assistance/impaired     Grooming: Bed level;Brushing hair;Wash/dry face;Total assistance Grooming Details (indicate cue type and reason): pt placed upright in bed to attempt increase in level of arousal, pt lethargic/sleepy and says yes, that would be nice when asked to wash face. does not attempt initiation of movement to grasp washcloth, even with hand over hand assist                               General ADL Comments: unsafe to further attempt mobility     Communication Communication Communication: No apparent difficulties   Cognition Arousal: Obtunded Behavior During Therapy: Flat affect Cognition: Difficult to assess Difficult to assess due to: Level of arousal           OT - Cognition Comments: Alert  to self only. Does not maintain alertness long enough to truely assess orientation fully. Pt falls asleep midsentence, very lethargic and does not participate                 Following commands: Impaired Following commands impaired:  (not following commands due to level of arousal)      Cueing   Cueing Techniques: Tactile cues, Gestural cues, Verbal cues        General Comments  Secure chat to MD and RN, BP assessed 114/50 (65) HR 88. Pt appears to have a change in status - per chart review and discussion with mobility specialist, pt  has been walking in hallway over the weekend.     Pertinent Vitals/ Pain       Pain Assessment Pain Assessment: PAINAD Breathing: normal Negative Vocalization: none Facial Expression: smiling or inexpressive Body Language: relaxed Consolability: no need to console PAINAD Score: 0   Frequency  Min 2X/week        Progress Toward Goals  OT Goals(current goals can now be found in the care plan section)  Progress towards OT goals: OT to reassess next treatment;Progressing toward goals  Acute Rehab OT Goals OT Goal Formulation: With patient Time For Goal Achievement: 02/22/24 Potential to Achieve Goals: Good  Plan         AM-PAC OT 6 Clicks Daily Activity     Outcome Measure   Help from another person eating meals?: Total Help from another person taking care of personal grooming?: Total Help from another person toileting, which includes using toliet, bedpan, or urinal?: Total Help from another person bathing (including washing, rinsing, drying)?: Total Help from another person to put on and taking off regular upper body clothing?: Total Help from another person to put on and taking off regular lower body clothing?: Total 6 Click Score: 6    End of Session    OT Visit Diagnosis: Unsteadiness on feet (R26.81);Other abnormalities of gait and mobility (R26.89);Repeated falls (R29.6);Muscle weakness (generalized) (M62.81)   Activity Tolerance Patient limited by lethargy   Patient Left in bed;with call bell/phone within reach;with bed alarm set   Nurse Communication Mobility status (secure chat to MD and RN re: potential change in pt mental status)        Time: 8884-8875 OT Time Calculation (min): 9 min  Charges: OT General Charges $OT Visit: 1 Visit OT Treatments $Self Care/Home Management : 8-22 mins  [place] Madding L. Quang Thorpe, OTR/L  02/17/24, 1:23 PM

## 2024-02-18 DIAGNOSIS — M069 Rheumatoid arthritis, unspecified: Secondary | ICD-10-CM | POA: Diagnosis not present

## 2024-02-18 DIAGNOSIS — E039 Hypothyroidism, unspecified: Secondary | ICD-10-CM | POA: Diagnosis not present

## 2024-02-18 DIAGNOSIS — N179 Acute kidney failure, unspecified: Secondary | ICD-10-CM | POA: Diagnosis not present

## 2024-02-18 DIAGNOSIS — U071 COVID-19: Secondary | ICD-10-CM

## 2024-02-18 LAB — VITAMIN A: Vitamin A (Retinoic Acid): 9.8 ug/dL — ABNORMAL LOW (ref 22.0–69.5)

## 2024-02-18 MED ORDER — VITAMIN A 3 MG (10000 UNIT) PO CAPS
50000.0000 [IU] | ORAL_CAPSULE | Freq: Every day | ORAL | Status: AC
  Administered 2024-02-21 – 2024-03-05 (×14): 50000 [IU] via ORAL
  Filled 2024-02-18 (×14): qty 5

## 2024-02-18 MED ORDER — VITAMIN A 3 MG (10000 UNIT) PO CAPS
100000.0000 [IU] | ORAL_CAPSULE | Freq: Every day | ORAL | Status: AC
  Administered 2024-02-18 – 2024-02-20 (×3): 100000 [IU] via ORAL
  Filled 2024-02-18 (×3): qty 10

## 2024-02-18 MED ORDER — ENSURE PLUS HIGH PROTEIN PO LIQD
237.0000 mL | Freq: Two times a day (BID) | ORAL | Status: DC
  Administered 2024-02-18 – 2024-03-11 (×41): 237 mL via ORAL

## 2024-02-18 NOTE — Plan of Care (Signed)
  Problem: Education: Goal: Knowledge of General Education information will improve Description: Including pain rating scale, medication(s)/side effects and non-pharmacologic comfort measures Outcome: Progressing   Problem: Health Behavior/Discharge Planning: Goal: Ability to manage health-related needs will improve Outcome: Progressing   Problem: Clinical Measurements: Goal: Ability to maintain clinical measurements within normal limits will improve Outcome: Progressing Goal: Will remain free from infection Outcome: Progressing Goal: Diagnostic test results will improve Outcome: Progressing Goal: Respiratory complications will improve Outcome: Progressing Goal: Cardiovascular complication will be avoided Outcome: Progressing   Problem: Activity: Goal: Risk for activity intolerance will decrease Outcome: Progressing   Problem: Nutrition: Goal: Adequate nutrition will be maintained Outcome: Progressing   Problem: Safety: Goal: Ability to remain free from injury will improve Outcome: Progressing   Problem: Respiratory: Goal: Will maintain a patent airway Outcome: Progressing Goal: Complications related to the disease process, condition or treatment will be avoided or minimized Outcome: Progressing

## 2024-02-18 NOTE — Progress Notes (Signed)
 Physical Therapy Treatment Patient Details Name: [name] MRN: [id number] DOB: 07/04/1941 Today's Date: 02/18/2024   History of Present Illness [name] is a 83 y.o. female with medical history significant for Hypothyroidism, rheumatoid arthritis on chronic prednisone and Renflexis  infusions, followed by rheumatology, osteoarthritis, PCP documentation of several falls requiring ED visits, and who currently uses a walker, who is being admitted for rhabdomyolysis after she was found down by a neighbor.  She was on the floor for over 24 hours.    PT Comments  Arrived in room and patient had slid all the way down chair to where she was sitting on leg rest portion of recliner. She is reporting her bottom is sore and would like to get back into bed. Patient assisted to stand from recliner with min A and found to be covered in stool. Session was spent cleaning patient, changing gown and assisting her back to bed. She performed several sit to stands with min A for cleaning and stood most of the time while being cleaned up. Patient will continue to benefit from skilled PT to improve functional independence, safety and strength        If plan is discharge home, recommend the following: Assistance with cooking/housework;A little help with bathing/dressing/bathroom;A little help with walking and/or transfers;Supervision due to cognitive status;Assist for transportation;Help with stairs or ramp for entrance   Can travel by private vehicle     Yes  Equipment Recommendations  Other (comment) (TBD next venue)    Recommendations for Other Services       Precautions / Restrictions Precautions Precautions: Fall Recall of Precautions/Restrictions: Impaired Restrictions Weight Bearing Restrictions Per Provider Order: No     Mobility  Bed Mobility Overal bed mobility: Needs Assistance Bed Mobility: Sit to Supine       Sit to supine: Min assist   General bed mobility comments: MIn A to  bring LEs into bed    Transfers Overall transfer level: Needs assistance Equipment used: Rolling walker (2 wheels) Transfers: Sit to/from Stand Sit to Stand: Min assist                Ambulation/Gait Ambulation/Gait assistance: Contact guard assist Gait Distance (Feet): 5 Feet Assistive device: Rolling walker (2 wheels) Gait Pattern/deviations: Step-to pattern, Decreased step length - right, Decreased step length - left, Decreased stride length Gait velocity: decreased     General Gait Details: unsteady with mobility   Stairs             Wheelchair Mobility     Tilt Bed    Modified Rankin (Stroke Patients Only)       Balance Overall balance assessment: Needs assistance Sitting-balance support: Feet supported Sitting balance-Leahy Scale: Good     Standing balance support: Bilateral upper extremity supported, During functional activity, Reliant on assistive device for balance Standing balance-Leahy Scale: Poor Standing balance comment: Heavy lean on the RW for support                            Communication Communication Communication: No apparent difficulties  Cognition Arousal: Alert Behavior During Therapy: WFL for tasks assessed/performed   PT - Cognitive impairments: No apparent impairments                         Following commands: Intact      Cueing Cueing Techniques: Verbal cues  Exercises      General Comments  Pertinent Vitals/Pain Pain Assessment Pain Assessment: No/denies pain    Home Living                          Prior Function            PT Goals (current goals can now be found in the care plan section) Acute Rehab PT Goals Patient Stated Goal: walk more PT Goal Formulation: With patient Time For Goal Achievement: 02/22/24 Potential to Achieve Goals: Good Progress towards PT goals: Progressing toward goals    Frequency    Min 2X/week      PT Plan       Co-evaluation              AM-PAC PT 6 Clicks Mobility   Outcome Measure  Help needed turning from your back to your side while in a flat bed without using bedrails?: A Little Help needed moving from lying on your back to sitting on the side of a flat bed without using bedrails?: A Little Help needed moving to and from a bed to a chair (including a wheelchair)?: A Little Help needed standing up from a chair using your arms (e.g., wheelchair or bedside chair)?: A Little Help needed to walk in hospital room?: A Little Help needed climbing 3-5 steps with a railing? : A Lot 6 Click Score: 17    End of Session   Activity Tolerance: Patient tolerated treatment well Patient left: in bed;with call bell/phone within reach;with bed alarm set Nurse Communication: Mobility status;Other (comment) (patient almost out of recliner ( slid down and covered in stool)) PT Visit Diagnosis: Other abnormalities of gait and mobility (R26.89);Muscle weakness (generalized) (M62.81);Repeated falls (R29.6);Difficulty in walking, not elsewhere classified (R26.2);Unsteadiness on feet (R26.81)     Time: 1050-1120 PT Time Calculation (min) (ACUTE ONLY): 30 min  Charges:    $Therapeutic Activity: 23-37 mins PT General Charges $$ ACUTE PT VISIT: 1 Visit                     Areen Trautner, PT, GCS 02/18/24,11:30 AM

## 2024-02-18 NOTE — Progress Notes (Signed)
 PROGRESS NOTE    [name]  FMW:[id number] DOB: Apr 01, 1941 DOA: 02/07/2024 PCP: Glover Lenis, MD  Chief Complaint  Patient presents with   [facility] Course:  83 y.o. female with medical history significant for Hypothyroidism, rheumatoid arthritis on chronic prednisone and Renflexis  infusions, followed by rheumatology, osteoarthritis, PCP documentation of several falls requiring ED visits, and who currently uses a walker, who is being admitted for rhabdomyolysis after she was found down by a neighbor.  She was on the floor for over 24 hours. In the ED she had a low-grade temperature of 99.1 with otherwise normal vitals.  Labs showed normal CBC, Creatinine of 1.85 up from baseline of 1.1 to with bicarb of 19.  Mild LFT elevation with AST 43 and total bilirubin 2.7.  Troponin 17.  Urinalysis with many bacteria and small leuks.  EKG showed sinus arrhythmia at 88 CT head and C-spine without acute injury.  Chest x-ray nonacute showing multiple degenerative changes x-ray of the knee showing moderate right knee effusion among other findings-please see report. Patient treated with 2 L NS bolus and pain medicine.  Admission requested.   6/21.  CK down to 861 and creatinine down to 1.36.  Continue IV fluids.  PT evaluation.  Patient does not have any recollection of how she got to the hospital. 6/22.  CK down to 513 and creatinine down to 1.0.  Appreciate psychiatric consultation. 6/23.  TOC looking into rehabs.  Urine culture came back positive and started on Rocephin  6/24.  Awaiting rehab.  Rocephin  changed over to Keflex . 6/25- 6/29.  No changes.  Pending rehab.  Performed peer to peer 6/27 6/30: Insurance denied rehab.  Likely home with home health.  Has APS case opened. TOC following up.  Patient had altered mentation and new fever.  Head CT negative.  Tested positive for COVID-19.  Subjective: Mental status much improved this morning.  Patient reports that she is feeling better.   She does have a slight cough now which is bothersome.  We discussed plans for discharge home and she endorses concerns that this may not be safe.  Objective: Vitals:   02/17/24 1607 02/17/24 2030 02/18/24 0459 02/18/24 0800  BP: (!) 105/48 (!) 111/52 (!) 118/54 (!) 113/51  Pulse: 97 86 79 86  Resp: 18   16  Temp: 100.3 F (37.9 C) 98.9 F (37.2 C) 98.4 F (36.9 C) 99 F (37.2 C)  TempSrc:      SpO2: 95% 99% 94% 98%  Weight:      Height:        Intake/Output Summary (Last 24 hours) at 02/18/2024 1601 Last data filed at 02/17/2024 1645 Gross per 24 hour  Intake --  Output 150 ml  Net -150 ml    Filed Weights   02/07/24 1842  Weight: 48.5 kg    Examination: General exam: Appears calm and comfortable, NAD, frail appearing Respiratory system: No work of breathing, symmetric chest wall expansion Neuro: Alert and oriented. No focal neurological deficits. Extremities: Symmetric, expected ROM Skin: No rashes, lesions Psychiatry: Demonstrates appropriate judgement and insight. Mood & affect appropriate for situation.   Assessment & Plan:  Principal Problem:   Rhabdomyolysis Active Problems:   AKI (acute kidney injury) (HCC)   On chronic immunosuppressive therapy   Multiple falls   Acute cystitis without hematuria   Acquired hypothyroidism   Rheumatoid arthritis involving multiple sites (HCC)   Memory loss   Pressure injury of skin   Protein-calorie malnutrition,  severe     COVID 19  -- New fever 6/30.  COVID-positive.  Patient has slight cough.  Remains on room air. - Will provide symptomatic support for now -- Incentive spirometry   Excessive somnolence - Very drowsy on 6/30.  Head CT negative.  Later developed fever.  COVID-19 positive. - Somnolence has resolved today.  She is back to her mental status baseline.    Multiple falls Unsafe living situation - Currently patient lives by herself, patient's cousin reports that she is a [employer] loss adjuster and home is roach  infested.  APS case has been open.  Appreciate updates. - Initially planned to discharge patient to rehab but insurance has denied this (peer to peer personally completed 6/27).  TOC reaching out directly today for update on APS case to ensure safe discharge plan available.  Will likely need to go home with home health.  Rheumatoid arthritis of multiple sites On chronic immunosuppressive therapy - On methotrexate and folic acid  - Patient denies taking prednisone on a daily basis.  Will hold for now  Vitamin A deficiency - Vitamin A low at 9.8.  Unclear etiology for this.  Patient does not have known malabsorptive syndrome but perhaps induced by methotrexate? - Have started replacement.  100,000u x 3 days followed by 50,000units x 2 weeks, followed by 10,000units  Rhabdomyolysis - CK peaked at 1195, has downtrended now - No further CK trend  AKI, resolved Hyponatremia - Initial creatinine 1.85, has resolved to baseline - Continue to renally dosed with a creatinine clearance of 41 when needed  Acute cystitis without hematuria - Urine culture with E. coli and Klebsiella, sensitive to Keflex .  Status post antibiotic course.  Completed 6/27.    Acquired hypothyroidism - TSH elevated.  Levothyroxine  adjusted.  Continue at current dose. - Outpatient follow-up with PCP for continued monitoring and prescribing   Protein calorie malnutrition, severe - Continue supplements  Pressure injury of skin - Wound nurse consulted - Daily wound care per RNs  Memory loss - Patient is demonstrating difficulty with short-term recall, also cannot report the events prior to this hospitalization.  Suspect some degree of underlying dementia.  Would benefit from further neuropsych testing outpatient for true diagnosis. - Mental status continues to wax and wane with increased somnolence today.  Workup as above. - Workup revealed slightly elevated TSH, RPR nonreactive, HIV nonreactive, B12 within normal  limits.  CT earlier this admission and today without acute intracranial abnormality.  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      - Psychiatry has seen and reports she does have capacity to make decisions at this time. - Would benefit from further outpatient workup for neuropsych testing   DVT prophylaxis: Lovenox    Code Status: Full Code Disposition: Medically stable for discharge, no safe dispo plan at this time Consultants:    Procedures:    Antimicrobials:  Anti-infectives (From admission, onward)    Start     Dose/Rate Route Frequency Ordered Stop   02/11/24 1000  cephALEXin  (KEFLEX ) capsule 500 mg        500 mg Oral Every 12 hours 02/10/24 1435 02/13/24 2139   02/09/24 1330  cefTRIAXone  (ROCEPHIN ) 1 g in sodium chloride  0.9 % 100 mL IVPB  Status:  Discontinued        1 g 200 mL/hr over 30 Minutes Intravenous Every 24 hours 02/09/24 1239 02/10/24 1435       Data Reviewed: I have personally reviewed following labs and imaging studies CBC: Recent Labs  Lab 02/17/24 1616  WBC 3.5*  NEUTROABS 2.5  HGB 12.3  HCT 38.1  MCV 93.2  PLT 212   Basic Metabolic Panel: Recent Labs  Lab 02/17/24 1616  NA 133*  K 3.9  CL 95*  CO2 28  GLUCOSE 99  BUN 21  CREATININE 0.80  CALCIUM 8.5*   GFR: Estimated Creatinine Clearance: 41.5 mL/min (by C-G formula based on SCr of 0.8 mg/dL). Liver Function Tests: Recent Labs  Lab 02/17/24 1616  AST 32  ALT 17  ALKPHOS 46  BILITOT 0.7  PROT 6.4*  ALBUMIN 2.7*    CBG: No results for input(s): GLUCAP in the last 168 hours.  Recent Results (from the past 240 hours)  Respiratory (~20 pathogens) panel by PCR     Status: None   Collection Time: 02/17/24  5:50 PM   Specimen: Nasopharyngeal Swab; Respiratory  Result Value Ref Range Status   Adenovirus NOT DETECTED NOT DETECTED Final   Coronavirus 229E NOT DETECTED NOT DETECTED Final    Comment: (NOTE) The Coronavirus on the Respiratory Panel, DOES NOT test for the novel   Coronavirus (2019 nCoV)    Coronavirus HKU1 NOT DETECTED NOT DETECTED Final   Coronavirus NL63 NOT DETECTED NOT DETECTED Final   Coronavirus OC43 NOT DETECTED NOT DETECTED Final   Metapneumovirus NOT DETECTED NOT DETECTED Final   Rhinovirus / Enterovirus NOT DETECTED NOT DETECTED Final   Influenza A NOT DETECTED NOT DETECTED Final   Influenza B NOT DETECTED NOT DETECTED Final   Parainfluenza Virus 1 NOT DETECTED NOT DETECTED Final   Parainfluenza Virus 2 NOT DETECTED NOT DETECTED Final   Parainfluenza Virus 3 NOT DETECTED NOT DETECTED Final   Parainfluenza Virus 4 NOT DETECTED NOT DETECTED Final   Respiratory Syncytial Virus NOT DETECTED NOT DETECTED Final   Bordetella pertussis NOT DETECTED NOT DETECTED Final   Bordetella Parapertussis NOT DETECTED NOT DETECTED Final   Chlamydophila pneumoniae NOT DETECTED NOT DETECTED Final   Mycoplasma pneumoniae NOT DETECTED NOT DETECTED Final    Comment: Performed at [facility], [address] [place], [place] [address]  Resp panel by RT-PCR (RSV, Flu A&B, Covid) Anterior Nasal Swab     Status: Abnormal   Collection Time: 02/17/24  5:50 PM   Specimen: Anterior Nasal Swab  Result Value Ref Range Status   SARS Coronavirus 2 by RT PCR POSITIVE (A) NEGATIVE Final    Comment: (NOTE) SARS-CoV-2 target nucleic acids are DETECTED.  The SARS-CoV-2 RNA is generally detectable in upper respiratory specimens during the acute phase of infection. Positive results are indicative of the presence of the identified virus, but do not rule out bacterial infection or co-infection with other pathogens not detected by the test. Clinical correlation with patient history and other diagnostic information is necessary to determine patient infection status. The expected result is Negative.  Fact Sheet for Patients: BloggerCourse.com  Fact Sheet for Healthcare Providers: SeriousBroker.it  This test is not  yet approved or cleared by the [place]  FDA and  has been authorized for detection and/or diagnosis of SARS-CoV-2 by FDA under an Emergency Use Authorization (EUA).  This EUA will remain in effect (meaning this test can be used) for the duration of  the COVID-19 declaration under Section 564(b)(1) of the A ct, 21 U.S.C. section 360bbb-3(b)(1), unless the authorization is terminated or revoked sooner.     Influenza A by PCR NEGATIVE NEGATIVE Final   Influenza B by PCR NEGATIVE NEGATIVE Final    Comment: (NOTE)  The Xpert Xpress SARS-CoV-2/FLU/RSV plus assay is intended as an aid in the diagnosis of influenza from Nasopharyngeal swab specimens and should not be used as a sole basis for treatment. Nasal washings and aspirates are unacceptable for Xpert Xpress SARS-CoV-2/FLU/RSV testing.  Fact Sheet for Patients: BloggerCourse.com  Fact Sheet for Healthcare Providers: SeriousBroker.it  This test is not yet approved or cleared by the [place]  FDA and has been authorized for detection and/or diagnosis of SARS-CoV-2 by FDA under an Emergency Use Authorization (EUA). This EUA will remain in effect (meaning this test can be used) for the duration of the COVID-19 declaration under Section 564(b)(1) of the Act, 21 U.S.C. section 360bbb-3(b)(1), unless the authorization is terminated or revoked.     Resp Syncytial Virus by PCR NEGATIVE NEGATIVE Final    Comment: (NOTE) Fact Sheet for Patients: BloggerCourse.com  Fact Sheet for Healthcare Providers: SeriousBroker.it  This test is not yet approved or cleared by the [place]  FDA and has been authorized for detection and/or diagnosis of SARS-CoV-2 by FDA under an Emergency Use Authorization (EUA). This EUA will remain in effect (meaning this test can be used) for the duration of the COVID-19 declaration under Section 564(b)(1)  of the Act, 21 U.S.C. section 360bbb-3(b)(1), unless the authorization is terminated or revoked.  Performed at [facility], [address], [place], [place] [address]      Radiology Studies: CT HEAD WO CONTRAST ( ) Result Date: 02/17/2024 CLINICAL DATA:  Mental status change, unknown cause EXAM: CT HEAD WITHOUT CONTRAST TECHNIQUE: Contiguous axial images were obtained from the base of the skull through the vertex without intravenous contrast. RADIATION DOSE REDUCTION: This exam was performed according to the departmental dose-optimization program which includes automated exposure control, adjustment of the mA and/or kV according to patient size and/or use of iterative reconstruction technique. COMPARISON:  CT of the head dated February 07, 2024. FINDINGS: Brain: Study is mildly degraded by patient motion. There is age-related cerebral and cerebellar volume loss present. There is mild to moderate cerebral white matter disease. 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 No evidence of hemorrhage, mass or acute cortical infarct. Vascular: Mild calcific atheromatous disease. Skull: Intact and unremarkable. Sinuses/Orbits: Paranasal sinuses are clear. The orbits appear normal. Other: None. IMPRESSION: 1. Age-related atrophy and a mild to moderate cerebral white matter disease. No apparent acute process or significant interval change. Electronically Signed   By: Evalene Coho M.D.   On: 02/17/2024 14:08    Scheduled Meds:  vitamin C   500 mg Oral BID   aspirin  EC  81 mg Oral Daily   cyanocobalamin   1,000 mcg Oral Daily   enoxaparin  (LOVENOX ) injection  40 mg Subcutaneous Q24H   feeding supplement  237 mL Oral BID BM   ferrous sulfate   325 mg Oral Daily   folic acid   1 mg Oral Daily   levothyroxine   88 mcg Oral Q0600   multivitamin with minerals  1 tablet Oral Daily   vitamin A  100,000 Units Oral Daily   Followed by   NOREEN ON 02/21/2024] vitamin A  50,000 Units  Oral Daily   zinc  sulfate (50mg  elemental zinc )  220 mg Oral Daily   Continuous Infusions:   LOS: 10 days  MDM: Total time spent interpreting labs and vitals, reviewing the medical record, coordinating care amongst consultants and care team members, directly assessing and discussing care with the patient and/or family: 25 min  Fanta Wimberley, DO [facility]  To  contact the attending physician between 7A-7P please use Epic Chat. To contact the covering physician during after hours 7P-7A, please review Amion.  02/18/2024, 4:01 PM   *This document has been created with the assistance of dictation software. Please excuse typographical errors. *

## 2024-02-18 NOTE — Progress Notes (Signed)
 Nutrition Follow-up  DOCUMENTATION CODES:   Severe malnutrition in context of social or environmental circumstances, Underweight  INTERVENTION:   -Continue dysphagia 3 (mechanical soft) for ease of intake -Decrease Ensure Plus High Protein po to BID, each supplement provides 350 kcal and 20 grams of protein  -Continue MVI with minerals daily -Continue 500 mg vitamin C  BID -Continue 220 mg zinc  sulfate daily x 14 days -Continue 100,00 units vitamin A daily  NUTRITION DIAGNOSIS:   Severe Malnutrition related to social / environmental circumstances as evidenced by moderate fat depletion, severe fat depletion, moderate muscle depletion, severe muscle depletion.  Ongoing  GOAL:   Patient will meet greater than or equal to 90% of their needs  Progressing   MONITOR:   PO intake, Supplement acceptance  REASON FOR ASSESSMENT:   Consult Assessment of nutrition requirement/status  ASSESSMENT:   Pt with medical history significant for Hypothyroidism, rheumatoid arthritis on chronic prednisone and Renflexis  infusions, followed by rheumatology, osteoarthritis, PCP documentation of several falls requiring ED visits, and who currently uses a walker, who is being admitted for rhabdomyolysis after she was found down by a neighbor.  Reviewed I/O's: -150 ml x 24 hours and +3.2 L since admission   Pt sleeping soundly at time of visit. She did not arouse to voice or touch. Noted meal completions 50-100%. Pt with variable acceptance of Ensure supplements, usually consuming 1-3 per day. RD will decrease frequency of supplements due to improved oral intake.   Per TOC notes, SNF was denied by insurance. Pt desires to go home with home health. Noted active APS case open due to pt home conditions.   Medications reviewed and include vitamin C , ferrous sulfate , folic acid , vitamin A, and zinc  sulfate.   Labs reviewed: Na: 133. Vitamin A: 9.8 (being repleted).  Diet Order:   Diet Order              DIET DYS 3 Fluid consistency: Thin  Diet effective now                   EDUCATION NEEDS:   Education needs have been addressed  Skin:  Skin Assessment: Skin Integrity Issues: Skin Integrity Issues:: Stage II, DTI DTI: rt and lt ischial tuberosity Stage II: coccyx  Last BM:  02/17/24 (type 6)  Height:   Ht Readings from Last 1 Encounters:  02/07/24 5' 6 (1.676 m)    Weight:   Wt Readings from Last 1 Encounters:  02/07/24 48.5 kg    Ideal Body Weight:  59.1 kg  BMI:  Body mass index is 17.27 kg/m.  Estimated Nutritional Needs:   Kcal:  1750-1950  Protein:  100-115 grams  Fluid:  1.7-1.9 L    Margery ORN, RD, LDN, CDCES Registered Dietitian III Certified Diabetes Care and Education Specialist If unable to reach this RD, please use RD Inpatient group chat on secure chat between hours of 8am-4 pm daily

## 2024-02-19 DIAGNOSIS — T796XXA Traumatic ischemia of muscle, initial encounter: Secondary | ICD-10-CM | POA: Diagnosis not present

## 2024-02-19 NOTE — Progress Notes (Signed)
 Occupational Therapy Treatment Patient Details Name: [name] MRN: [id number] DOB: 08-Oct-1940 Today's Date: 02/19/2024   History of present illness [name] is a 83 y.o. female with medical history significant for Hypothyroidism, rheumatoid arthritis on chronic prednisone and Renflexis  infusions, followed by rheumatology, osteoarthritis, PCP documentation of several falls requiring ED visits, and who currently uses a walker, who is being admitted for rhabdomyolysis after she was found down by a neighbor. Found to be COVID+ on 6/30.   OT comments  Pt seen for OT tx this date, pleasant and agreeable to session. Alert to self and general situation but not date. Bed mobility to transition from supine > sit with supervision, functional transfers with CGA using RW, and stands at sink for grooming tasks 4 mins, functional mobility t/f door in room back to bed. Pt generally weak and with decreased activity tolerance. OT will continue POC, discharge recommendation appropriate. If pt returns home, she will need 24/7 supervision and +1 hands on assist at all times due to increased falls risk. Patient will benefit from continued inpatient follow up therapy, <3 hours/day       If plan is discharge home, recommend the following:  A little help with walking and/or transfers;A little help with bathing/dressing/bathroom;Assistance with cooking/housework;Direct supervision/assist for medications management;Direct supervision/assist for financial management;Assist for transportation;Supervision due to cognitive status;Help with stairs or ramp for entrance   Equipment Recommendations  Other (comment) (defer to next LOC)       Precautions / Restrictions Precautions Precautions: Fall Recall of Precautions/Restrictions: Impaired Restrictions Weight Bearing Restrictions Per Provider Order: No       Mobility Bed Mobility Overal bed mobility: Needs Assistance Bed Mobility: Supine to Sit, Sit to  Supine     Supine to sit: Supervision Sit to supine: Min assist   General bed mobility comments: assist to boost self up in bed with bed pad    Transfers Overall transfer level: Needs assistance Equipment used: Rolling walker (2 wheels) Transfers: Sit to/from Stand Sit to Stand: Contact guard assist           General transfer comment: cues for hand placement, pt has forward flexed positoin     Balance Overall balance assessment: Needs assistance Sitting-balance support: Feet supported Sitting balance-Leahy Scale: Good     Standing balance support: Bilateral upper extremity supported, During functional activity, Reliant on assistive device for balance Standing balance-Leahy Scale: Poor Standing balance comment: Heavy lean on the RW for support                           ADL either performed or assessed with clinical judgement   ADL Overall ADL's : Needs assistance/impaired     Grooming: Applying deodorant;Brushing hair;Standing;Contact guard assist Grooming Details (indicate cue type and reason): sink level, standing                             Functional mobility during ADLs: Contact guard assist;Rolling walker (2 wheels) General ADL Comments: t/f door and back to bed using RW. pt frail, forward flexed position, walks slow but no LOB. had just returned back to bed from bathroom on OT arrival     Communication Communication Communication: No apparent difficulties   Cognition Arousal: Alert Behavior During Therapy: [facility] for tasks assessed/performed  Following commands: Intact Following commands impaired: Only follows one step commands consistently      Cueing   Cueing Techniques: Verbal cues             Pertinent Vitals/ Pain       Pain Assessment Pain Assessment: No/denies pain   Frequency  Min 2X/week        Progress Toward Goals  OT Goals(current goals can now be found in  the care plan section)  Progress towards OT goals: Progressing toward goals  Acute Rehab OT Goals OT Goal Formulation: With patient Time For Goal Achievement: 02/22/24 Potential to Achieve Goals: Good ADL Goals Pt Will Perform Grooming: with modified independence;standing Pt Will Perform Lower Body Dressing: with modified independence;sitting/lateral leans Pt Will Transfer to Toilet: with modified independence;ambulating Pt Will Perform Toileting - Clothing Manipulation and hygiene: with modified independence;sit to/from stand  Plan         AM-PAC OT 6 Clicks Daily Activity     Outcome Measure   Help from another person eating meals?: None Help from another person taking care of personal grooming?: None Help from another person toileting, which includes using toliet, bedpan, or urinal?: A Little Help from another person bathing (including washing, rinsing, drying)?: A Little Help from another person to put on and taking off regular upper body clothing?: A Little Help from another person to put on and taking off regular lower body clothing?: A Little 6 Click Score: 20    End of Session Equipment Utilized During Treatment: Gait belt;Rolling walker (2 wheels)  OT Visit Diagnosis: Unsteadiness on feet (R26.81);Other abnormalities of gait and mobility (R26.89);Repeated falls (R29.6);Muscle weakness (generalized) (M62.81)   Activity Tolerance Patient tolerated treatment well   Patient Left in bed;with call bell/phone within reach;with bed alarm set   Nurse Communication Mobility status        Time: 8565-8551 OT Time Calculation (min): 14 min  Charges: OT General Charges $OT Visit: 1 Visit OT Treatments $Self Care/Home Management : 8-22 mins Biannca Scantlin L. Cabella Kimm, OTR/L  02/19/24, 2:55 PM

## 2024-02-19 NOTE — Progress Notes (Signed)
 OT Cancellation Note  Patient Details Name: [name] MRN: [id number] DOB: 1940-10-02   Cancelled Treatment:    Reason Eval/Treat Not Completed: Other (comment). OT attempt - pt received just getting back into bed with MS. Pt requesting to rest, and for author to return after lunch. OT will check back as able.   Finlee Concepcion L. Keanon Bevins, OTR/L  02/19/24, 11:41 AM

## 2024-02-19 NOTE — Progress Notes (Signed)
 PROGRESS NOTE  [name]  FMW:[id number] DOB: 1941/02/17 DOA: 02/07/2024 PCP: Glover Lenis, MD  Chief Complaint  Patient presents with   [facility] Course:  83 y.o. female with medical history significant for Hypothyroidism, rheumatoid arthritis on chronic prednisone and Renflexis  infusions, followed by rheumatology, osteoarthritis, PCP documentation of several falls requiring ED visits, and who currently uses a walker, who is being admitted for rhabdomyolysis after she was found down by a neighbor > 24 hours. In the ED she had a low-grade temperature of 99.1 with otherwise normal vitals.  Labs showed normal CBC, Creatinine of 1.85 up from baseline of 1.1 to with bicarb of 19.  Mild LFT elevation with AST 43 and total bilirubin 2.7.  Troponin 17.  Urinalysis with many bacteria and small leuks.  EKG showed sinus arrhythmia at 88 CT head and C-spine without acute injury.  Chest x-ray nonacute showing multiple degenerative changes x-ray of the knee showing moderate right knee effusion among other findings-please see report. Patient treated with 2 L NS bolus and pain medicine.  Completed antibiotic for UTI. IVF given and resolved rhabdo. Rehab was recommended by PT/OT but insurance company denied. TOC consulted for avenues for safe dispo as she lives alone and felt unsafe to be home alone. APS contacted and started case.  6/30 she was screened positive for COVID. She is currently asymptomatic and feels at baseline. She is ready for dc when safe dispo arranged.    Subjective: Pt has some joint pain, especially in hands which is chronic. She otherwise feels well and is awaiting dispo plans.   Objective: Vitals:   02/18/24 0800 02/18/24 1709 02/18/24 2301 02/19/24 0415  BP: (!) 113/51 91/61 102/63 (!) 109/56  Pulse: 86 79 79 73  Resp: 16  16 18   Temp: 99 F (37.2 C) 98.2 F (36.8 C) 98.1 F (36.7 C) 98.2 F (36.8 C)  TempSrc:      SpO2: 98% 93% 96% 96%  Weight:      Height:         Intake/Output Summary (Last 24 hours) at 02/19/2024 0751 Last data filed at 02/18/2024 2002 Gross per 24 hour  Intake --  Output 200 ml  Net -200 ml   Filed Weights   02/07/24 1842  Weight: 48.5 kg   Examination: General exam: Appears calm and comfortable, NAD, frail appearing Respiratory system: No increased work of breathing, symmetric chest wall expansion Neuro: Alert and oriented. No focal neurological deficits. Extremities: significant joint deformity of hands consistent with RA Skin: No rashes, lesions Psychiatry: Demonstrates appropriate judgement and insight. Mood & affect appropriate for situation.   Assessment & Plan:  Principal Problem:   Rhabdomyolysis Active Problems:   AKI (acute kidney injury) (HCC)   On chronic immunosuppressive therapy   Multiple falls   Acute cystitis without hematuria   Acquired hypothyroidism   Rheumatoid arthritis involving multiple sites (HCC)   Memory loss   Pressure injury of skin   Protein-calorie malnutrition, severe     COVID 19  -- New fever and somnolence 6/30.  COVID-positive.Remains on room air. - Will provide symptomatic support for now -- Incentive spirometry  - Somnolence has resolved.  She is back to her mental status baseline.    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  APS case has been open.  Appreciate updates. - Initially planned to discharge patient to rehab  but insurance has denied this (peer to peer completed 6/27). - TOC consulted for APS coordination. Will likely need to go home with home health.  Rheumatoid arthritis of multiple sites On chronic immunosuppressive therapy - On methotrexate and folic acid  - Patient denies taking prednisone on a daily basis.  Will hold for now  Vitamin A deficiency - Vitamin A low at 9.8.  Unclear etiology for this.  Patient does not have known malabsorptive  syndrome but perhaps induced by methotrexate? - Have started replacement.  100,000u x 3 days followed by 50,000units x 2 weeks, followed by 10,000units  Rhabdomyolysis - CK peaked at 1195, has downtrended now - No further CK trend  AKI, resolved Hyponatremia - Initial creatinine 1.85, has resolved to baseline - Continue to renally dosed with a creatinine clearance of 41 when needed  Acute cystitis without hematuria - Urine culture with E. coli and Klebsiella, sensitive to Keflex .  Status post antibiotic course.  Completed 6/27.    Acquired hypothyroidism - TSH elevated.  Levothyroxine  adjusted.  Continue at current dose. - Outpatient follow-up with PCP for continued monitoring and prescribing   Protein calorie malnutrition, severe - Continue supplements  Pressure injury of skin - Wound nurse consulted - Daily wound care per RNs  Memory loss - Patient is demonstrating difficulty with short-term recall, also cannot report the events prior to this hospitalization.  Suspect some degree of underlying dementia.  Would benefit from further neuropsych testing outpatient for true diagnosis. - Mental status continues to wax and wane with increased somnolence today.  Workup as above. - Workup revealed slightly elevated TSH, RPR nonreactive, HIV nonreactive, B12 within normal limits.  CT earlier this admission and today without acute intracranial abnormality.  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      - Psychiatry has seen and reports she does have capacity to make decisions at this time. - Would benefit from further outpatient workup for neuropsych testing  DVT prophylaxis: Lovenox    Code Status: Full Code Disposition: Medically stable for discharge, no safe dispo plan at this time Consultants: none  Procedures:  none  Antimicrobials:  Anti-infectives (From admission, onward)    Start     Dose/Rate Route Frequency Ordered Stop   02/11/24 1000  cephALEXin  (KEFLEX ) capsule 500 mg         500 mg Oral Every 12 hours 02/10/24 1435 02/13/24 2139   02/09/24 1330  cefTRIAXone  (ROCEPHIN ) 1 g in sodium chloride  0.9 % 100 mL IVPB  Status:  Discontinued        1 g 200 mL/hr over 30 Minutes Intravenous Every 24 hours 02/09/24 1239 02/10/24 1435      Data Reviewed: I have personally reviewed following labs and imaging studies CBC: Recent Labs  Lab 02/17/24 1616  WBC 3.5*  NEUTROABS 2.5  HGB 12.3  HCT 38.1  MCV 93.2  PLT 212   Basic Metabolic Panel: Recent Labs  Lab 02/17/24 1616  NA 133*  K 3.9  CL 95*  CO2 28  GLUCOSE 99  BUN 21  CREATININE 0.80  CALCIUM 8.5*    LOS: 11 days  MDM: Total time spent interpreting labs and vitals, reviewing the medical record, coordinating care amongst consultants and care team members, directly assessing and discussing care with the patient and/or family: 45 min  Kassia Demarinis LITTIE Piety, DO [facility]  To contact the attending physician between 7A-7P please use Epic Chat. To contact the covering physician during after hours 7P-7A, please review Amion.  02/19/2024,  7:51 AM

## 2024-02-19 NOTE — Progress Notes (Signed)
 Mobility Specialist - Progress Note   02/19/24 1040  Mobility  Activity Ambulated with assistance in room;Transferred from bed to chair  Level of Assistance Contact guard assist, steadying assist  Assistive Device Front wheel walker  Distance Ambulated (ft) 16 ft  Activity Response Tolerated well  Mobility visit 1 Mobility  Mobility Specialist Start Time (ACUTE ONLY) 1010  Mobility Specialist Stop Time (ACUTE ONLY) 1034  Mobility Specialist Time Calculation (min) (ACUTE ONLY) 24 min   Pt semi fowler upon entry, utilizing RA. Pt agreeable to activity this date, expressed little to no pain this date. Pt completed bed mob MinA, STS to RW MinA and amb ~20 ft CGA within the room before sitting in the recliner-- fatigue noted ~10 ft into amb. Pt amb to the recliner, urination once seated. Pt STS to RW MinA, required MaxA for peri care and gown change. Pt returned seated in the recliner with alarm set and needs within reach.   America Silvan Mobility Specialist 02/19/24 11:08 AM

## 2024-02-19 NOTE — Plan of Care (Signed)
  Problem: Health Behavior/Discharge Planning: Goal: Ability to manage health-related needs will improve Outcome: Progressing   Problem: Clinical Measurements: Goal: Will remain free from infection Outcome: Progressing   Problem: Nutrition: Goal: Adequate nutrition will be maintained Outcome: Progressing   Problem: Elimination: Goal: Will not experience complications related to urinary retention Outcome: Progressing   Problem: Skin Integrity: Goal: Risk for impaired skin integrity will decrease Outcome: Progressing

## 2024-02-19 NOTE — Plan of Care (Signed)
   Problem: Education: Goal: Knowledge of General Education information will improve Description: Including pain rating scale, medication(s)/side effects and non-pharmacologic comfort measures Outcome: Progressing   Problem: Activity: Goal: Risk for activity intolerance will decrease Outcome: Progressing

## 2024-02-20 DIAGNOSIS — R296 Repeated falls: Secondary | ICD-10-CM | POA: Diagnosis not present

## 2024-02-20 DIAGNOSIS — T796XXA Traumatic ischemia of muscle, initial encounter: Secondary | ICD-10-CM | POA: Diagnosis not present

## 2024-02-20 MED ORDER — PREDNISONE 10 MG PO TABS
5.0000 mg | ORAL_TABLET | Freq: Every day | ORAL | Status: DC
  Administered 2024-02-20 – 2024-03-11 (×21): 5 mg via ORAL
  Filled 2024-02-20 (×21): qty 1

## 2024-02-20 MED ORDER — METHOTREXATE SODIUM 2.5 MG PO TABS: 17.5000 mg | ORAL_TABLET | ORAL | Status: DC

## 2024-02-20 NOTE — Progress Notes (Signed)
 PROGRESS NOTE [name]  FMW:[id number] DOB: 1941/03/06 DOA: 02/07/2024 PCP: Glover Lenis, MD  Chief Complaint  Patient presents with   [facility] Course:  [name] is a 83 y.o. female with medical history significant for Hypothyroidism, rheumatoid arthritis on chronic prednisone and Renflexis  infusions, followed by rheumatology. They presented for rhabdomyolysis after she was found down by a neighbor > 24 hours when she fell and could not get up. ED course: normal vitals.  Labs showed normal CBC, Creatinine of 1.85 up from baseline of 1.1 to with bicarb of 19.  Mild LFT elevation with AST 43 and total bilirubin 2.7.  Troponin 17.  Urinalysis with many bacteria and small leuks.  EKG showed sinus arrhythmia at 88 CT head and C-spine without acute injury.  Chest x-ray nonacute showing multiple degenerative changes x-ray of the knee showing moderate right knee effusion among other findings-please see report. Patient treated with 2 L NS bolus and pain medicine.  Completed antibiotic for UTI. IVF given and resolved rhabdo. Rehab was recommended by PT/OT but insurance company denied. TOC consulted for avenues for safe dispo as she lives alone and felt unsafe to be home alone. APS contacted and started case.  6/30 she was screened positive for COVID. She is currently asymptomatic and feels at baseline. She is ready for dc when safe dispo arranged.    Subjective: Pt is resting comfortably in chair. Denies complaints. She is ready to leave and is hopeful for us  to figure out a safe place for her to stay.   Objective: Vitals:   02/19/24 0813 02/19/24 1616 02/19/24 2003 02/20/24 0424  BP: (!) 109/52 (!) 101/46 (!) 95/46 (!) 102/45  Pulse: 77 85 80 67  Resp: 16 17 18 18   Temp: 98.3 F (36.8 C) 98.8 F (37.1 C) 98.4 F (36.9 C) 97.9 F (36.6 C)  TempSrc: Oral Oral    SpO2: 96% 100% 96% 96%  Weight:      Height:        Intake/Output Summary (Last 24 hours) at 02/20/2024  0754 Last data filed at 02/19/2024 1443 Gross per 24 hour  Intake --  Output 100 ml  Net -100 ml   Filed Weights   02/07/24 1842  Weight: 48.5 kg   Examination: General exam: Appears calm and comfortable, NAD, frail appearing Respiratory system: No increased work of breathing, symmetric chest wall expansion Neuro: Alert and oriented. No focal neurological deficits. Extremities: severe joint deformity of hands and feet consistent with severe RA Skin: No rashes, lesions Psychiatry: Demonstrates appropriate judgement and insight. Mood & affect appropriate for situation.   Assessment & Plan:  Principal Problem:   Rhabdomyolysis Active Problems:   AKI (acute kidney injury) (HCC)   On chronic immunosuppressive therapy   Multiple falls   Acute cystitis without hematuria   Acquired hypothyroidism   Rheumatoid arthritis involving multiple sites (HCC)   Memory loss   Pressure injury of skin   Protein-calorie malnutrition, severe     COVID 19  -- New fever and somnolence 6/30.  COVID-positive. Remains on room air. - Will provide symptomatic support for now -- Incentive spirometry  - Somnolence has resolved.  She is back to her mental status baseline.    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  APS case has been open.  Appreciate updates. - Initially planned to discharge patient to rehab but  insurance has denied this (peer to peer completed 6/27). - TOC consulted for APS coordination. Will likely need to go home with home health.  Rheumatoid arthritis of multiple sites On chronic immunosuppressive therapy - On methotrexate and folic acid  - Patient denies taking prednisone on a daily basis.  Will hold for now - podiatry consulted as patient's deformity has resulted in her toenails from one foot curving back and causing lacerations to other toes/foot and bleeding.    Vitamin A deficiency - Vitamin A low at 9.8.  Unclear etiology for this.  Patient does not have known malabsorptive syndrome but perhaps induced by methotrexate? - Have started replacement.  100,000u x 3 days followed by 50,000units x 2 weeks, followed by 10,000units  Rhabdomyolysis - CK peaked at 1195, has downtrended now - No further CK trend  AKI, resolved Hyponatremia - Initial creatinine 1.85, has resolved to baseline - Continue to renally dosed with a creatinine clearance of 41 when needed  Acute cystitis without hematuria - Urine culture with E. coli and Klebsiella, sensitive to Keflex .  Status post antibiotic course.  Completed 6/27.    Acquired hypothyroidism - TSH elevated.  Levothyroxine  adjusted.  Continue at current dose. - Outpatient follow-up with PCP for continued monitoring and prescribing   Protein calorie malnutrition, severe - Continue supplements  Pressure injury of skin - Wound nurse consulted - Daily wound care per RNs  Memory loss - Patient is demonstrating difficulty with short-term recall, also cannot report the events prior to this hospitalization.  Suspect some degree of underlying dementia.  Would benefit from further neuropsych testing outpatient for true diagnosis. - Mental status continues to wax and wane with increased somnolence today.  Workup as above. - Workup revealed slightly elevated TSH, RPR nonreactive, HIV nonreactive, B12 within normal limits.  CT earlier this admission and today without acute intracranial abnormality.  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      - Psychiatry has seen and reports she does have capacity to make decisions at this time. - Would benefit from further outpatient workup for neuropsych testing  DVT prophylaxis: Lovenox    Code Status: Full Code Disposition: Medically stable for discharge, no safe dispo plan at this time Consultants: podiatry   Procedures:  none  Antimicrobials:  Anti-infectives (From  admission, onward)    Start     Dose/Rate Route Frequency Ordered Stop   02/11/24 1000  cephALEXin  (KEFLEX ) capsule 500 mg        500 mg Oral Every 12 hours 02/10/24 1435 02/13/24 2139   02/09/24 1330  cefTRIAXone  (ROCEPHIN ) 1 g in sodium chloride  0.9 % 100 mL IVPB  Status:  Discontinued        1 g 200 mL/hr over 30 Minutes Intravenous Every 24 hours 02/09/24 1239 02/10/24 1435      Data Reviewed: I have personally reviewed following labs and imaging studies CBC: Recent Labs  Lab 02/17/24 1616  WBC 3.5*  NEUTROABS 2.5  HGB 12.3  HCT 38.1  MCV 93.2  PLT 212   Basic Metabolic Panel: Recent Labs  Lab 02/17/24 1616  NA 133*  K 3.9  CL 95*  CO2 28  GLUCOSE 99  BUN 21  CREATININE 0.80  CALCIUM 8.5*    LOS: 12 days  MDM: Total time spent interpreting labs and vitals, reviewing the medical record, coordinating care amongst consultants and care team members, directly assessing and discussing care with the patient and/or family: 45 min  Marien LITTIE Piety, DO [facility]  To contact the attending physician between 7A-7P please use Epic Chat. To contact the covering physician during after hours 7P-7A, please review Amion.  02/20/2024, 7:54 AM

## 2024-02-20 NOTE — Progress Notes (Signed)
 Occupational Therapy Treatment Patient Details Name: [name] MRN: [id number] DOB: 06/02/1941 Today's Date: 02/20/2024   History of present illness [name] is a 83 y.o. female with medical history significant for Hypothyroidism, rheumatoid arthritis on chronic prednisone and Renflexis  infusions, followed by rheumatology, osteoarthritis, PCP documentation of several falls requiring ED visits, and who currently uses a walker, who is being admitted for rhabdomyolysis after she was found down by a neighbor. Found to be COVID+ on 6/30.   OT comments  Pt seen for OT tx this date. Pt found to be incontinent of urine with gown and bed soiled, pt unaware. Pt requires MIN A for bed mobility, MIN A for UB bathing with cues for thoroughness, MAX A for LB bathing from STS position, and MAX A to doff/don socks while sitting EOB. Does require increased assist today to rise from bed (MIN A using RW), and CGA to transfer to recliner.   BP start of session 98/56 (70), end of session seated in recliner 110/58 (73). Pt unsafe to discharge home alone due to falls risk and weakness. OT POC updated. Patient will benefit from continued inpatient follow up therapy, <3 hours/day.        If plan is discharge home, recommend the following:  A little help with walking and/or transfers;A little help with bathing/dressing/bathroom;Assistance with cooking/housework;Direct supervision/assist for medications management;Direct supervision/assist for financial management;Assist for transportation;Supervision due to cognitive status;Help with stairs or ramp for entrance   Equipment Recommendations  Other (comment)       Precautions / Restrictions Precautions Precautions: Fall Recall of Precautions/Restrictions: Impaired       Mobility Bed Mobility Overal bed mobility: Needs Assistance Bed Mobility: Supine to Sit, Sit to Supine     Supine to sit: Min assist     General bed mobility comments: slow movement,  step by step cues    Transfers Overall transfer level: Needs assistance Equipment used: Rolling walker (2 wheels) Transfers: Sit to/from Stand Sit to Stand: Min assist           General transfer comment: cues for hand placment, MIN A to rise, CGA for stepping to recliner     Balance Overall balance assessment: Needs assistance Sitting-balance support: Feet supported Sitting balance-Leahy Scale: Good     Standing balance support: Bilateral upper extremity supported, During functional activity, Reliant on assistive device for balance Standing balance-Leahy Scale: Poor Standing balance comment: unable to use unilateral support for pericare in standing                           ADL either performed or assessed with clinical judgement   ADL Overall ADL's : Needs assistance/impaired     Grooming: Sitting;Applying deodorant Grooming Details (indicate cue type and reason): recliner level Upper Body Bathing: Minimal assistance;Sitting Upper Body Bathing Details (indicate cue type and reason): EOB, assist for back and for throughness Lower Body Bathing: Maximal assistance;Sit to/from stand Lower Body Bathing Details (indicate cue type and reason): MAX A for thoroughness Upper Body Dressing : Set up;Sitting Upper Body Dressing Details (indicate cue type and reason): recliner level                 Functional mobility during ADLs: Contact guard assist;Rolling walker (2 wheels) General ADL Comments: full UB/LB bath from STS EOB. pt with decreased problem solving, requires cues to bathe thoroughly.     Communication Communication Communication: No apparent difficulties   Cognition Arousal: Alert  Behavior During Therapy: WFL for tasks assessed/performed Cognition: Cognition impaired     Awareness: Online awareness impaired, Intellectual awareness intact Memory impairment (select all impairments): Short-term memory, Working [employer] fast streamer, Non-declarative long-term  memory Attention impairment (select first level of impairment): Selective attention Executive functioning impairment (select all impairments): Reasoning, Problem solving OT - Cognition Comments: Pt found to be incontienent of urine with sheets + gown completely soaked. She was unaware.                 Following commands: Intact Following commands impaired: Only follows one step commands consistently      Cueing   Cueing Techniques: Verbal cues        General Comments Foam padding removed on buttocks, RN made aware. Pt with several pressure wounds on buttocks. When removing socks, pt noted to have dried blood between R toes. MD / RN notified for possible infection (pt does have significant deformities in both feet/hands due to RA). BP soft, but pt not symptomatic.     Pertinent Vitals/ Pain       Pain Assessment Pain Assessment: No/denies pain         Frequency  Min 2X/week        Progress Toward Goals  OT Goals(current goals can now be found in the care plan section)  Progress towards OT goals: Progressing toward goals  Acute Rehab OT Goals OT Goal Formulation: With patient Time For Goal Achievement: 02/27/24 Potential to Achieve Goals: Good ADL Goals Pt Will Perform Grooming: with modified independence;standing Pt Will Perform Lower Body Dressing: with modified independence;sitting/lateral leans Pt Will Transfer to Toilet: with modified independence;ambulating Pt Will Perform Toileting - Clothing Manipulation and hygiene: with modified independence;sit to/from stand  Plan      Co-evaluation                 AM-PAC OT 6 Clicks Daily Activity     Outcome Measure   Help from another person eating meals?: None Help from another person taking care of personal grooming?: None Help from another person toileting, which includes using toliet, bedpan, or urinal?: A Lot Help from another person bathing (including washing, rinsing, drying)?: A Lot Help from  another person to put on and taking off regular upper body clothing?: A Lot Help from another person to put on and taking off regular lower body clothing?: A Lot 6 Click Score: 16    End of Session Equipment Utilized During Treatment: Rolling walker (2 wheels)  OT Visit Diagnosis: Unsteadiness on feet (R26.81);Other abnormalities of gait and mobility (R26.89);Repeated falls (R29.6);Muscle weakness (generalized) (M62.81)   Activity Tolerance Patient tolerated treatment well   Patient Left in bed;with call bell/phone within reach;with bed alarm set   Nurse Communication Mobility status        Time: 9087-9047 OT Time Calculation (min): 40 min  Charges: OT General Charges $OT Visit: 1 Visit OT Treatments $Self Care/Home Management : 38-52 mins  Hutchinson Isenberg L. Micky Overturf, OTR/L  02/20/24, 10:11 AM

## 2024-02-20 NOTE — Progress Notes (Signed)
 Physical Therapy Treatment Patient Details Name: [name] MRN: [id number] DOB: 13-May-1941 Today's Date: 02/20/2024   History of Present Illness [name] is a 83 y.o. female with medical history significant for Hypothyroidism, rheumatoid arthritis on chronic prednisone and Renflexis  infusions, followed by rheumatology, osteoarthritis, PCP documentation of several falls requiring ED visits, and who currently uses a walker, who is being admitted for rhabdomyolysis after she was found down by a neighbor. Found to be COVID+ on 6/30.    PT Comments  Pt seen for modified session due to declining OOB after just returning to back to bed - pt had worked with OT earlier. Therefore session focused on Bed mobility/repositioning and B LE strengthening exercises. Pt educated on role of PT and benefits of STR which she is in agreement to.    If plan is discharge home, recommend the following: Assistance with cooking/housework;A little help with bathing/dressing/bathroom;A little help with walking and/or transfers;Supervision due to cognitive status;Assist for transportation;Help with stairs or ramp for entrance   Can travel by private vehicle     Yes  Equipment Recommendations  Other (comment) (TBD)    Recommendations for Other Services       Precautions / Restrictions Precautions Precautions: Fall Recall of Precautions/Restrictions: Impaired Restrictions Weight Bearing Restrictions Per Provider Order: No     Mobility  Bed Mobility Overal bed mobility: Needs Assistance Bed Mobility: Rolling Rolling: Used rails, Min assist         General bed mobility comments: declined OOB after just returning to bed an hour ago    Transfers                        Ambulation/Gait                   Stairs             Wheelchair Mobility     Tilt Bed    Modified Rankin (Stroke Patients Only)       Balance                                             Communication Communication Communication: No apparent difficulties  Cognition Arousal: Alert Behavior During Therapy: WFL for tasks assessed/performed   PT - Cognitive impairments: No apparent impairments                       PT - Cognition Comments: AxOx4 Following commands: Intact Following commands impaired: Only follows one step commands consistently    Cueing Cueing Techniques: Verbal cues  Exercises General Exercises - Lower Extremity Ankle Circles/Pumps: AROM, Both, 10 reps Heel Slides: AAROM, Both, 10 reps, Supine Hip ABduction/ADduction: AAROM, Both, 10 reps, Supine Other Exercises Other Exercises: Pt educated on the benefits of OOB and role of PT. Pt agrees to STR at d/c.    General Comments        Pertinent Vitals/Pain Pain Assessment Pain Assessment: No/denies pain    Home Living                          Prior Function            PT Goals (current goals can now be found in the care plan section) Acute Rehab PT Goals Patient Stated Goal: walk more  Frequency    Min 2X/week      PT Plan      Co-evaluation              AM-PAC PT 6 Clicks Mobility   Outcome Measure  Help needed turning from your back to your side while in a flat bed without using bedrails?: A Little Help needed moving from lying on your back to sitting on the side of a flat bed without using bedrails?: A Little Help needed moving to and from a bed to a chair (including a wheelchair)?: A Little Help needed standing up from a chair using your arms (e.g., wheelchair or bedside chair)?: A Little Help needed to walk in hospital room?: A Little Help needed climbing 3-5 steps with a railing? : A Lot 6 Click Score: 17    End of Session   Activity Tolerance: Patient limited by fatigue Patient left: in bed;with call bell/phone within reach;with bed alarm set Nurse Communication: Mobility status;Other (comment) PT Visit Diagnosis: Other  abnormalities of gait and mobility (R26.89);Muscle weakness (generalized) (M62.81);Repeated falls (R29.6);Difficulty in walking, not elsewhere classified (R26.2);Unsteadiness on feet (R26.81)     Time: 1329-1340 PT Time Calculation (min) (ACUTE ONLY): 11 min  Charges:    $Therapeutic Exercise: 8-22 mins PT General Charges $$ ACUTE PT VISIT: 1 Visit                    Darice Bohr, PTA  Darice JAYSON Bohr 02/20/2024, 3:29 PM

## 2024-02-20 NOTE — TOC Progression Note (Addendum)
 Transition of Care [facility]) - Progression Note    Patient Details  Name: [name] MRN: [id number] Date of Birth: 1941-01-11  [facility]) CM/SW Contact  Lorraine LILLETTE Fenton, LCSW Phone Number: 02/20/2024, 3:41 PM  Clinical Narrative:     CSW received a call from cousin who found pt, she reiterated home was heavily cluttered and she has call DSS.  Cousin cannot do more because she cares for parents. CSW shared that she will follow up with DSS.  CSW received a return call from Sari Kerns at [facility].  She shared they have an active case wit Pt and case manager is Nanetta-  I had also left her a mess on 7/2.  DSS supervisor agreed to have [name] call TOC- see RN CM note on 7/3.  CSW visited pt at bedside.  Shared with pt insurance denial for SNF, asked pt if she feels safe going home.  Pt stated she does need  roach extermination, has had in the past.   She has had exterminators come out in the past. At home she uses her walker, as needed.  She does live alone  and have friends and a cousin she check on her. I asked pt if she has her house key- she does not - pt states her cousin has he pocketbook and has the key in there- so cousin should be contacted prior to DC.  TOC following.      Barriers to Discharge: Continued Medical Work up  Expected Discharge Plan and Services                                               Social Determinants of Health (SDOH) Interventions SDOH Screenings   Food Insecurity: Patient Unable To Answer (02/08/2024)  Housing: Unknown (02/08/2024)  Transportation Needs: Patient Unable To Answer (02/08/2024)  Utilities: Patient Unable To Answer (02/08/2024)  Financial Resource Strain: Low Risk  (01/25/2023)   Received from [facility]  Social Connections: Patient Unable To Answer (02/08/2024)  Tobacco Use: Low Risk  (02/07/2024)    Readmission Risk Interventions     No data to display

## 2024-02-20 NOTE — Consult Note (Signed)
  Subjective:  Patient ID: [name], female    DOB: Oct 24, 1940,  MRN: [id number]  Patient with past medical history of  Hypothyroidism, rheumatoid arthritis on chronic prednisone and Renflexis  infusions, followed by rheumatology, osteoarthritis, PCP documentation of several falls requiring ED visits, and who currently uses a walker, who is being admitted for rhabdomyolysis after she was found down by a neighbor seen at beside today for concern of deformities in her feet due to RA causing ulcerations on feet. Denies any n/v/c/f. Denies any other pedal compalints.    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   Procedure: TOTAL HIP ARTHROPLASTY;  Surgeon: Lynwood SHAUNNA Hue, MD;  Location: [facility] ORS;  Service: Orthopedics;  Laterality: Left;       Latest Ref Rng & Units 02/17/2024    4:16 PM 02/11/2024    4:21 AM 02/08/2024    6:05 AM  CBC  WBC 4.0 - 10.5 K/uL 3.5  3.9  4.3   Hemoglobin 12.0 - 15.0 g/dL 87.6  87.5  89.2   Hematocrit 36.0 - 46.0 % 38.1  38.3  33.6   Platelets 150 - 400 K/uL 212  146  146        Latest Ref Rng & Units 02/17/2024    4:16 PM 02/11/2024    4:21 AM 02/09/2024    1:34 AM  BMP  Glucose 70 - 99 mg/dL 99  80  75   BUN 8 - 23 mg/dL 21  10  32   Creatinine 0.44 - 1.00 mg/dL 9.19  9.21  8.99   Sodium 135 - 145 mmol/L 133  138  139   Potassium 3.5 - 5.1 mmol/L 3.9  3.7  3.4   Chloride 98 - 111 mmol/L 95  103  110   CO2 22 - 32 mmol/L 28  28  24    Calcium 8.9 - 10.3 mg/dL 8.5  8.3  7.7      Objective:   Vitals:   02/20/24 0424 02/20/24 1414  BP: (!) 102/45 (!) 112/45  Pulse: 67 76  Resp: 18 18  Temp: 97.9 F (36.6 C) 98.9 F (37.2 C)  SpO2: 96% 97%    General:AA&O x 3. Normal mood and affect   Vascular: DP and PT pulses 2/4 bilateral. Brisk  capillary refill to all digits. Pedal hair present   Neruological. Epicritic sensation grossly intact.   Derm: No open ulcerations noted. Interspaces clears of maceration. Nails well groomed and normal in appearance  MSK: MMT 5/5 in dorsiflexion, plantar flexion, inversion and eversion. 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 Severe abduction deformity due to RA.      Assessment & Plan:  Patient was evaluated and treated and all questions answered. -Educated on RA and deformities including bunions and  hammertoes and treatment options  -Discussed padding including toe caps and crest pads.  -Disucssed Rom exercises  -Discussed surgery is sometimes an option but likely would not be a good surgical candidate.  -Patient to follow-up as needed with us  in outpatient clinic.    Asberry Failing, DPM  Accessible via secure chat for questions or concerns.

## 2024-02-20 NOTE — Plan of Care (Signed)
  Problem: Education: Goal: Knowledge of General Education information will improve Description: Including pain rating scale, medication(s)/side effects and non-pharmacologic comfort measures Outcome: Progressing   Problem: Health Behavior/Discharge Planning: Goal: Ability to manage health-related needs will improve Outcome: Progressing   Problem: Clinical Measurements: Goal: Ability to maintain clinical measurements within normal limits will improve Outcome: Progressing Goal: Will remain free from infection Outcome: Progressing Goal: Diagnostic test results will improve Outcome: Progressing Goal: Respiratory complications will improve Outcome: Progressing Goal: Cardiovascular complication will be avoided Outcome: Progressing   Problem: Activity: Goal: Risk for activity intolerance will decrease Outcome: Progressing   Problem: Nutrition: Goal: Adequate nutrition will be maintained Outcome: Progressing   Problem: Coping: Goal: Level of anxiety will decrease Outcome: Progressing   Problem: Elimination: Goal: Will not experience complications related to bowel motility Outcome: Progressing Goal: Will not experience complications related to urinary retention Outcome: Progressing   Problem: Pain Managment: Goal: General experience of comfort will improve and/or be controlled Outcome: Progressing   Problem: Safety: Goal: Ability to remain free from injury will improve Outcome: Progressing   Problem: Skin Integrity: Goal: Risk for impaired skin integrity will decrease Outcome: Progressing   Problem: Education: Goal: Knowledge of risk factors and measures for prevention of condition will improve Outcome: Progressing   Problem: Coping: Goal: Psychosocial and spiritual needs will be supported Outcome: Progressing   Problem: Respiratory: Goal: Will maintain a patent airway Outcome: Progressing Goal: Complications related to the disease process, condition or treatment  will be avoided or minimized Outcome: Progressing

## 2024-02-20 NOTE — TOC Progression Note (Addendum)
 Transition of Care [facility]) - Progression Note    Patient Details  Name: [name] MRN: [id number] Date of Birth: [birth year]  Transition of Care [facility]) CM/SW Contact  Dalia GORMAN Fuse, RN Phone Number: 02/20/2024, 3:14 PM  Clinical Narrative:     TOC received a call from DSS caseworker, Nanetta Cable [phone], following up on a call that I made earlier this week when covering the patient. TOC expalined that the patient's TOC today is [name]. Nanetta visited with the patient today and wants to ensure the patient has a safe discharge plan. Nanetta thought the plan was for the patient to go to a SNF for STR. TOC explained the patient's insurance did not authorize the STR admission. The patients preference is to go Home w/ HH. Because the patient has a history of frequent falls [name]'s  preference is that she have support at home. TOC advised that she can request for the MD to order [facility] PT/OT, Aide, and SW, but reiterated that it would be limited hours. Some time ago the patient had people from church come in to assist with light house keeping and taking out the trash. TOC explained that we could emphasize to the patient and her family that additional support is needed. Nanetta  will talk this case over with her supervisor and follow-up with TOC on Monday.   TOC will continue to follow.         Expected Discharge Plan and Services                                               Social Determinants of Health (SDOH) Interventions SDOH Screenings   Food Insecurity: Patient Unable To Answer (02/08/2024)  Housing: Unknown (02/08/2024)  Transportation Needs: Patient Unable To Answer (02/08/2024)  Utilities: Patient Unable To Answer (02/08/2024)  Financial Resource Strain: Low Risk  (01/25/2023)   Received from [facility]  Social Connections: Patient Unable To Answer (02/08/2024)  Tobacco Use: Low Risk  (02/07/2024)    Readmission Risk Interventions     No data  to display

## 2024-02-20 NOTE — Plan of Care (Signed)
  Problem: Clinical Measurements: Goal: Ability to maintain clinical measurements within normal limits will improve Outcome: Progressing   Problem: Pain Managment: Goal: General experience of comfort will improve and/or be controlled Outcome: Progressing   Problem: Safety: Goal: Ability to remain free from injury will improve Outcome: Progressing

## 2024-02-20 NOTE — Plan of Care (Signed)
  Problem: Health Behavior/Discharge Planning: Goal: Ability to manage health-related needs will improve Outcome: Progressing   Problem: Clinical Measurements: Goal: Will remain free from infection Outcome: Progressing   Problem: Activity: Goal: Risk for activity intolerance will decrease Outcome: Progressing   Problem: Elimination: Goal: Will not experience complications related to urinary retention Outcome: Progressing

## 2024-02-21 DIAGNOSIS — M6282 Rhabdomyolysis: Secondary | ICD-10-CM | POA: Diagnosis not present

## 2024-02-21 LAB — CBC
HCT: 32.4 % — ABNORMAL LOW (ref 36.0–46.0)
Hemoglobin: 10.8 g/dL — ABNORMAL LOW (ref 12.0–15.0)
MCH: 30.4 pg (ref 26.0–34.0)
MCHC: 33.3 g/dL (ref 30.0–36.0)
MCV: 91.3 fL (ref 80.0–100.0)
Platelets: 167 K/uL (ref 150–400)
RBC: 3.55 MIL/uL — ABNORMAL LOW (ref 3.87–5.11)
RDW: 14.6 % (ref 11.5–15.5)
WBC: 2.7 K/uL — ABNORMAL LOW (ref 4.0–10.5)
nRBC: 0 % (ref 0.0–0.2)

## 2024-02-21 MED ORDER — ORAL CARE MOUTH RINSE: 15.0000 mL | OROMUCOSAL | Status: DC | PRN

## 2024-02-21 NOTE — Progress Notes (Signed)
 Progress Note   Patient: [name] FMW:[id number] DOB: Dec 27, 1940 DOA: 02/07/2024     13 DOS: the patient was seen and examined on 02/21/2024   Brief hospital course: TAIS KOESTNER is a 83 y.o. female with medical history significant for Hypothyroidism, rheumatoid arthritis on chronic prednisone  and Renflexis  infusions, followed by rheumatology. They presented for rhabdomyolysis after she was found down by a neighbor > 24 hours when she fell and could not get up. ED course: normal vitals.  Labs showed normal CBC, Creatinine of 1.85 up from baseline of 1.1 to with bicarb of 19.  Mild LFT elevation with AST 43 and total bilirubin 2.7.  Troponin 17.  Urinalysis with many bacteria and small leuks.  EKG showed sinus arrhythmia at 88 CT head and C-spine without acute injury.  Chest x-ray nonacute showing multiple degenerative changes x-ray of the knee showing moderate right knee effusion among other findings-please see report. Patient treated with 2 L NS bolus and pain medicine.    Completed antibiotic for UTI.  IVF given and resolved rhabdo. Rehab was recommended by PT/OT but insurance company denied. TOC consulted for avenues for safe dispo as she lives alone and felt unsafe to be home alone. APS contacted and started case.   6/30 she was screened positive for COVID. She is currently asymptomatic and feels at baseline. She is ready for dc when safe dispo arranged.     Assessment and Plan: COVID 19  -- New fever and somnolence 6/30.  COVID-positive. Remains on room air. - Will provide symptomatic support for now -- Incentive spirometry  - Somnolence has resolved.  She is back to her mental status baseline.     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  APS case has been open.  Appreciate updates. - Initially planned to discharge patient to rehab but insurance has denied this (peer  to peer completed 6/27). - TOC consulted for APS coordination. Will likely need to go home with home health.   Rheumatoid arthritis of multiple sites On chronic immunosuppressive therapy - On methotrexate  and folic acid  - Patient denies taking prednisone  on a daily basis.  Will hold for now - podiatry consulted as patient's deformity has resulted in her toenails from one foot curving back and causing lacerations to other toes/foot and bleeding.    Vitamin A  deficiency - Vitamin A  low at 9.8.  Unclear etiology for this.  Patient does not have known malabsorptive syndrome but perhaps induced by methotrexate ? - Have started replacement.  100,000u x 3 days followed by 50,000units x 2 weeks, followed by 10,000units   Rhabdomyolysis - CK peaked at 1195, has downtrended now - No further CK trend   AKI, resolved Hyponatremia - Initial creatinine 1.85, has resolved to baseline - Continue to renally dosed with a creatinine clearance of 41 when needed   Acute cystitis without hematuria - Urine culture with E. coli and Klebsiella, sensitive to Keflex .  Status post antibiotic course.  Completed 6/27.     Acquired hypothyroidism - TSH elevated.  Levothyroxine  adjusted.  Continue at current dose. - Outpatient follow-up with PCP for continued monitoring and prescribing    Protein calorie malnutrition, severe - Continue supplements   Pressure injury of skin - Wound nurse consulted - Daily wound care per RNs   Memory loss - Patient is demonstrating difficulty with short-term recall, also cannot report the events prior to  this hospitalization.  Suspect some degree of underlying dementia.  Would benefit from further neuropsych testing outpatient for true diagnosis. - Mental status continues to wax and wane with increased somnolence today.  Workup as above. - Workup revealed slightly elevated TSH, RPR nonreactive, HIV nonreactive, B12 within normal limits.  CT earlier this admission and today  without acute intracranial abnormality.  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      - Psychiatry has seen and reports she does have capacity to make decisions at this time. - Would benefit from further outpatient workup for neuropsych testing   DVT prophylaxis: Lovenox    Code Status: Full Code Disposition: Medically stable for discharge, no safe dispo plan at this time Consultants: podiatry        Subjective: No fever or cough  Physical Exam: Vitals:   02/20/24 1703 02/20/24 2206 02/21/24 0501 02/21/24 0746  BP: (!) 105/52 (!) 109/56 (!) 108/51 111/89  Pulse:  78 72 72  Resp:  16 18 18   Temp:  98.3 F (36.8 C) 98.2 F (36.8 C) 98.2 F (36.8 C)  TempSrc:    Oral  SpO2:  99% 100% 97%  Weight:      Height:       Constitutional: Alert, awake, calm, comfortable HEENT: Neck supple Respiratory: clear to auscultation bilaterally, no wheezing, no crackles. Normal respiratory effort. No accessory muscle use.  Cardiovascular: Regular rate and rhythm, no murmurs / rubs / gallops. No extremity edema. 2+ pedal pulses. No carotid bruits.  Abdomen: no tenderness, no masses palpated. No hepatosplenomegaly. Bowel sounds positive.  Musculoskeletal: no clubbing / cyanosis. No joint deformity upper and lower extremities. Good ROM, no contractures. Normal muscle tone.  Skin: no rashes, lesions, ulcers. No induration Neurologic: CN 2-12 grossly intact. Sensation intact, DTR normal. Strength 5/5 x all 4 extremities.    Data Reviewed:  H/H 05/28/31.4  Family Communication: None  Disposition: Status is: Inpatient Remains inpatient appropriate because: requiring placement  Planned Discharge Destination: Skilled nursing facility    Time spent: 35 minutes  Author: Nena Rebel, MD 02/21/2024 3:54 PM  For on call review [email or web address].

## 2024-02-21 NOTE — Plan of Care (Signed)

## 2024-02-22 DIAGNOSIS — M6282 Rhabdomyolysis: Secondary | ICD-10-CM | POA: Diagnosis not present

## 2024-02-22 NOTE — Plan of Care (Signed)

## 2024-02-22 NOTE — Progress Notes (Signed)
 Progress Note   Patient: [name] FMW:[id number] DOB: 06/28/1941 DOA: 02/07/2024     14 DOS: the patient was seen and examined on 02/22/2024   Brief hospital course: [name] is a 83 y.o. female with medical history significant for Hypothyroidism, rheumatoid arthritis on chronic prednisone  and Renflexis  infusions, followed by rheumatology. They presented for rhabdomyolysis after she was found down by a neighbor > 24 hours when she fell and could not get up. ED course: normal vitals.  Labs showed normal CBC, Creatinine of 1.85 up from baseline of 1.1 to with bicarb of 19.  Mild LFT elevation with AST 43 and total bilirubin 2.7.  Troponin 17.  Urinalysis with many bacteria and small leuks.  EKG showed sinus arrhythmia at 88 CT head and C-spine without acute injury.  Chest x-ray nonacute showing multiple degenerative changes x-ray of the knee showing moderate right knee effusion among other findings-please see report. Patient treated with 2 L NS bolus and pain medicine.    Completed antibiotic for UTI.  IVF given and resolved rhabdo. Rehab was recommended by PT/OT but insurance company denied. TOC consulted for avenues for safe dispo as she lives alone and felt unsafe to be home alone. APS contacted and started case.   6/30 she was screened positive for COVID. She is currently asymptomatic and feels at baseline. She is ready for dc when safe dispo arranged.     Assessment and Plan:   COVID 19  -- New fever and somnolence 6/30.  COVID-positive. Remains on room air. - Will provide symptomatic support for now -- Incentive spirometry  - Somnolence has resolved.  She is back to her mental status baseline.     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  APS case has been open.  Appreciate updates. - Initially planned to discharge patient to rehab but insurance has denied this  (peer to peer completed 6/27). - TOC consulted for APS coordination. Will likely need to go home with home health.   Rheumatoid arthritis of multiple sites On chronic immunosuppressive therapy - On methotrexate  and folic acid  - Patient denies taking prednisone  on a daily basis.  Will hold for now - podiatry consulted as patient's deformity has resulted in her toenails from one foot curving back and causing lacerations to other toes/foot and bleeding.    Vitamin A  deficiency - Vitamin A  low at 9.8.  Unclear etiology for this.  Patient does not have known malabsorptive syndrome but perhaps induced by methotrexate ? - Have started replacement.  100,000u x 3 days followed by 50,000units x 2 weeks, followed by 10,000units   Rhabdomyolysis - CK peaked at 1195, has downtrended now - No further CK trend   AKI, resolved Hyponatremia - Initial creatinine 1.85, has resolved to baseline - Continue to renally dosed with a creatinine clearance of 41 when needed   Acute cystitis without hematuria - Urine culture with E. coli and Klebsiella, sensitive to Keflex .  Status post antibiotic course.  Completed 6/27.     Acquired hypothyroidism - TSH elevated.  Levothyroxine  adjusted.  Continue at current dose. - Outpatient follow-up with PCP for continued monitoring and prescribing    Protein calorie malnutrition, severe - Continue supplements   Pressure injury of skin - Wound nurse consulted - Daily wound care per RNs   Memory loss - Patient is demonstrating difficulty with short-term recall, also cannot report the events  prior to this hospitalization.  Suspect some degree of underlying dementia.  Would benefit from further neuropsych testing outpatient for true diagnosis. - Mental status continues to wax and wane with increased somnolence today.  Workup as above. - Workup revealed slightly elevated TSH, RPR nonreactive, HIV nonreactive, B12 within normal limits.  CT earlier this admission and today  without acute intracranial abnormality.  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      - Psychiatry has seen and reports she does have capacity to make decisions at this time. - Would benefit from further outpatient workup for neuropsych testing   DVT prophylaxis: Lovenox    Code Status: Full Code   Disposition: Medically stable for discharge, no safe dispo plan at this time Consultants: podiatry          Subjective: Feels better  Physical Exam: Vitals:   02/21/24 1611 02/21/24 2038 02/22/24 0415 02/22/24 0743  BP: (!) 110/53 (!) 105/51 (!) 109/58 (!) 140/63  Pulse: 80 74 72 72  Resp: 19 16 16 20   Temp: 98.3 F (36.8 C) 98.3 F (36.8 C) 98.2 F (36.8 C) 97.7 F (36.5 C)  TempSrc:  Oral Oral   SpO2: 97% 96% 96% 95%  Weight:      Height:       Constitutional: Alert, awake, calm, comfortable HEENT: Neck supple Respiratory: clear to auscultation bilaterally, no wheezing, no crackles. Normal respiratory effort. No accessory muscle use.  Cardiovascular: Regular rate and rhythm, no murmurs / rubs / gallops. No extremity edema. 2+ pedal pulses. No carotid bruits.  Abdomen: no tenderness, no masses palpated. No hepatosplenomegaly. Bowel sounds positive.  Musculoskeletal: no clubbing / cyanosis. No joint deformity upper and lower extremities. Good ROM, no contractures. Normal muscle tone.  Skin: no rashes, lesions, ulcers. No induration Neurologic: CN 2-12 grossly intact. Sensation intact, DTR normal. Strength 5/5 x all 4 extremities.  Psychiatric: Normal judgment and insight. Alert and oriented x 3. Normal mood.     Data Reviewed:  No new results  Family Communication: No family members available   Disposition: Status is: Inpatient Remains inpatient appropriate because: needing placement and safe discharge    Planned Discharge Destination: Skilled nursing facility    Time spent: 35 minutes  Author: Nena Rebel, MD 02/22/2024 2:11 PM  For on call review  [email or web address].

## 2024-02-23 DIAGNOSIS — U071 COVID-19: Secondary | ICD-10-CM

## 2024-02-23 DIAGNOSIS — N3000 Acute cystitis without hematuria: Secondary | ICD-10-CM | POA: Diagnosis not present

## 2024-02-23 DIAGNOSIS — M6282 Rhabdomyolysis: Secondary | ICD-10-CM | POA: Diagnosis not present

## 2024-02-23 DIAGNOSIS — N179 Acute kidney failure, unspecified: Secondary | ICD-10-CM | POA: Diagnosis not present

## 2024-02-23 NOTE — Progress Notes (Signed)
 Progress Note   Patient: [name] FMW:[id number] DOB: 1941-06-25 DOA: 02/07/2024     15 DOS: the patient was seen and examined on 02/23/2024   Brief hospital course: SACRED ROA is a 83 y.o. female with medical history significant for Hypothyroidism, rheumatoid arthritis on chronic prednisone  and Renflexis  infusions, followed by rheumatology. They presented for rhabdomyolysis after she was found down by a neighbor > 24 hours when she fell and could not get up. ED course: normal vitals.  Labs showed normal CBC, Creatinine of 1.85 up from baseline of 1.1 to with bicarb of 19.  Mild LFT elevation with AST 43 and total bilirubin 2.7.  Troponin 17.  Urinalysis with many bacteria and small leuks.  EKG showed sinus arrhythmia at 88 CT head and C-spine without acute injury.  Chest x-ray nonacute showing multiple degenerative changes x-ray of the knee showing moderate right knee effusion among other findings-please see report. Patient treated with 2 L NS bolus and pain medicine.    Completed antibiotic for UTI.  IVF given and resolved rhabdo. Rehab was recommended by PT/OT but insurance company denied. TOC consulted for avenues for safe dispo as she lives alone and felt unsafe to be home alone. APS contacted and started case.   6/30 she was screened positive for COVID. She is currently asymptomatic and feels at baseline. She is ready for dc when safe dispo arranged.     Assessment and Plan: * COVID-19 virus infection Supportive care.  Diagnosed on 6/30.  Rhabdomyolysis CK peaked at 1195.  This problem has resolved.  AKI (acute kidney injury) (HCC) Creatinine improved from 1.85 down to 0.8  Acute cystitis without hematuria E. coli and Klebsiella growing out of urine culture.  Completed antibiotic course.  Multiple falls Lives by herself.  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  TOC to speak with DSS on Monday to try to set up  plan.  Rheumatoid arthritis involving multiple sites [facility]) On chronic immunosuppressive therapy Patient takes methotrexate  and folic acid .  Patient placed on prednisone  5 mg daily.  Acquired hypothyroidism Continue levothyroxine  at slightly increased dose with TSH being slightly elevated.  Protein-calorie malnutrition, severe Continue supplements  Pressure injury of skin Wound care nurse reading as deep tissue pressure injury bilateral buttock areas.  Memory loss Definitely concerned about her memory and living by herself.  Seen by psychiatry and deemed capable of making medical decisions currently.  TOC to speak with DSS on Monday to try to set up a plan.        Subjective: Patient feels okay.  Offers no complaints.  Patient initially admitted with rhabdomyolysis but then diagnosed with COVID on the 30th.  Physical Exam: Vitals:   02/22/24 1639 02/22/24 1936 02/23/24 0441 02/23/24 0850  BP: (!) 107/44 106/66 (!) 110/48 (!) 120/55  Pulse: 79 80 66 69  Resp: 19 16 18 18   Temp: (!) 97.5 F (36.4 C) 98.2 F (36.8 C)  98.3 F (36.8 C)  TempSrc:  Oral    SpO2: 99% 100% 95% 97%  Weight:      Height:       Physical Exam HENT:     Head: Normocephalic.  Eyes:     General: Lids are normal.     Conjunctiva/sclera: Conjunctivae normal.  Cardiovascular:     Rate and Rhythm: Normal rate and regular rhythm.     Heart sounds: Normal heart sounds, S1 normal and S2 normal.  Pulmonary:     Breath sounds: No decreased breath sounds, wheezing, rhonchi or rales.  Abdominal:     Palpations: Abdomen is soft.     Tenderness: There is no abdominal tenderness.  Musculoskeletal:     Right lower leg: No swelling.     Left lower leg: No swelling.  Skin:    General: Skin is warm.     Findings: No rash.  Neurological:     Mental Status: She is alert.     Data Reviewed: COVID diagnosed on the afternoon of 6/30. Last white blood cell count 2.7 last hemoglobin 10.8 last platelet count  167  Family Communication: Spoke with Beth on the phone  Disposition: Status is: Inpatient TOC to speak with DSS on Monday for plan. Planned Discharge Destination: Unfortunately patient does not have much support at home and concerned about her memory.  DSS to be involved in plan    Time spent: 28 minutes  Author: Charlie Patterson, MD 02/23/2024 2:34 PM  For on call review [email or web address].

## 2024-02-23 NOTE — Assessment & Plan Note (Addendum)
 Supportive care.  Diagnosed on 6/30.

## 2024-02-23 NOTE — Plan of Care (Signed)
  Problem: Education: Goal: Knowledge of General Education information will improve Description: Including pain rating scale, medication(s)/side effects and non-pharmacologic comfort measures Outcome: Progressing   Problem: Health Behavior/Discharge Planning: Goal: Ability to manage health-related needs will improve Outcome: Progressing   Problem: Clinical Measurements: Goal: Ability to maintain clinical measurements within normal limits will improve Outcome: Progressing Goal: Will remain free from infection Outcome: Progressing Goal: Diagnostic test results will improve Outcome: Progressing Goal: Respiratory complications will improve Outcome: Progressing Goal: Cardiovascular complication will be avoided Outcome: Progressing   Problem: Activity: Goal: Risk for activity intolerance will decrease Outcome: Progressing   Problem: Nutrition: Goal: Adequate nutrition will be maintained Outcome: Progressing   Problem: Coping: Goal: Level of anxiety will decrease Outcome: Progressing   Problem: Elimination: Goal: Will not experience complications related to bowel motility Outcome: Progressing Goal: Will not experience complications related to urinary retention Outcome: Progressing   Problem: Pain Managment: Goal: General experience of comfort will improve and/or be controlled Outcome: Progressing   Problem: Safety: Goal: Ability to remain free from injury will improve Outcome: Progressing   Problem: Skin Integrity: Goal: Risk for impaired skin integrity will decrease Outcome: Progressing   Problem: Education: Goal: Knowledge of risk factors and measures for prevention of condition will improve Outcome: Progressing   Problem: Coping: Goal: Psychosocial and spiritual needs will be supported Outcome: Progressing   Problem: Respiratory: Goal: Will maintain a patent airway Outcome: Progressing Goal: Complications related to the disease process, condition or treatment  will be avoided or minimized Outcome: Progressing

## 2024-02-23 NOTE — Consult Note (Signed)
 WOC team reconsulted for worsening wounds.  See original consult 02/10/2024.  Secure chat to primary team requesting updated photos for consult.   Please note that the [facility] nursing team is utilizing a standardized work plan to manage patient consults. We are triaging consults and will try to see the patients within 48 hours. Wound photos in the patient's chart allow us  to consult on the patient in the most efficient and timely manner.    Thank you,    Powell Bar MSN, RN-BC, [employer]

## 2024-02-23 NOTE — Plan of Care (Signed)

## 2024-02-24 DIAGNOSIS — U071 COVID-19: Secondary | ICD-10-CM | POA: Diagnosis not present

## 2024-02-24 DIAGNOSIS — N3000 Acute cystitis without hematuria: Secondary | ICD-10-CM | POA: Diagnosis not present

## 2024-02-24 DIAGNOSIS — T796XXS Traumatic ischemia of muscle, sequela: Secondary | ICD-10-CM | POA: Diagnosis not present

## 2024-02-24 DIAGNOSIS — L89320 Pressure ulcer of left buttock, unstageable: Secondary | ICD-10-CM

## 2024-02-24 DIAGNOSIS — N179 Acute kidney failure, unspecified: Secondary | ICD-10-CM | POA: Diagnosis not present

## 2024-02-24 LAB — CBC
HCT: 35 % — ABNORMAL LOW (ref 36.0–46.0)
Hemoglobin: 11.6 g/dL — ABNORMAL LOW (ref 12.0–15.0)
MCH: 30 pg (ref 26.0–34.0)
MCHC: 33.1 g/dL (ref 30.0–36.0)
MCV: 90.4 fL (ref 80.0–100.0)
Platelets: 178 K/uL (ref 150–400)
RBC: 3.87 MIL/uL (ref 3.87–5.11)
RDW: 14.6 % (ref 11.5–15.5)
WBC: 3.2 K/uL — ABNORMAL LOW (ref 4.0–10.5)
nRBC: 0 % (ref 0.0–0.2)

## 2024-02-24 MED ORDER — ZINC OXIDE 40 % EX OINT
TOPICAL_OINTMENT | Freq: Two times a day (BID) | CUTANEOUS | Status: DC
  Filled 2024-02-24: qty 113

## 2024-02-24 MED ORDER — COLLAGENASE 250 UNIT/GM EX OINT
TOPICAL_OINTMENT | Freq: Every day | CUTANEOUS | Status: DC
  Administered 2024-03-07: 1 via TOPICAL
  Filled 2024-02-24: qty 30

## 2024-02-24 NOTE — Progress Notes (Signed)
 Progress Note   Patient: [name] FMW:[id number] DOB: 1941/02/05 DOA: 02/07/2024     16 DOS: the patient was seen and examined on 02/24/2024   Brief hospital course: BURNETT LIEBER is a 83 y.o. female with medical history significant for Hypothyroidism, rheumatoid arthritis on chronic prednisone  and Renflexis  infusions, followed by rheumatology. They presented for rhabdomyolysis after she was found down by a neighbor > 24 hours when she fell and could not get up. ED course: normal vitals.  Labs showed normal CBC, Creatinine of 1.85 up from baseline of 1.1 to with bicarb of 19.  Mild LFT elevation with AST 43 and total bilirubin 2.7.  Troponin 17.  Urinalysis with many bacteria and small leuks.  EKG showed sinus arrhythmia at 88 CT head and C-spine without acute injury.  Chest x-ray nonacute showing multiple degenerative changes x-ray of the knee showing moderate right knee effusion among other findings-please see report. Patient treated with 2 L NS bolus and pain medicine.    Completed antibiotic for UTI.  IVF given and resolved rhabdo. Rehab was recommended by PT/OT but insurance company denied. TOC consulted for avenues for safe dispo as she lives alone and felt unsafe to be home alone. APS contacted and started case.   6/30 she was screened positive for COVID. She is currently asymptomatic and feels at baseline. She is ready for dc when safe dispo arranged.     Assessment and Plan: * COVID-19 virus infection Supportive care.  Diagnosed on 6/30.  Rhabdomyolysis CK peaked at 1195.  This problem has resolved.  AKI (acute kidney injury) (HCC) Creatinine improved from 1.85 down to 0.8  Acute cystitis without hematuria E. coli and Klebsiella growing out of urine culture.  Completed antibiotic course.  Multiple falls Lives by herself.  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  TOC left message for DSS.  Rheumatoid arthritis involving  multiple sites [facility]) On chronic immunosuppressive therapy Patient takes methotrexate  and folic acid .  Patient placed on prednisone  5 mg daily.  Acquired hypothyroidism Continue levothyroxine  at slightly increased dose with TSH being slightly elevated.  Protein-calorie malnutrition, severe Continue supplements  Pressure injury of skin Present on admission.  Wounds restaged by wound care nurse.  They have progressed..  1.  Right buttock/ischium stage III.  2.  Left buttock/ischium now unstageable.  Cleanse bilateral buttock wounds with Vashe wound cleanser [name] [phone]), allowed to air dry.  Apply one quarter thick layer of Santyl  to wound beds daily using a Q-tip applicator to fill on the depth of the left buttock wound with normal saline gauze, top right buttock wound with normal saline gauze.  Top dry gauze and secure with silicone foam or ABD pad.  Memory loss Definitely concerned about her memory and living by herself.  Seen by psychiatry and deemed capable of making medical decisions currently.  TOC to speak with DSS on Monday to try to set up a plan.       Subjective: Patient feels okay.  Offers no complaints.  Initially admitted with acute kidney injury and rhabdomyolysis.  Now has COVID infection.  Physical Exam: Vitals:   02/23/24 1550 02/23/24 1939 02/24/24 0529 02/24/24 0846  BP: (!) 103/46 (!) 104/57 (!) 106/51 (!) 116/52  Pulse: 82 79 71 69  Resp: 18   19  Temp: 98.7 F (37.1 C) 98.7 F (37.1 C) 98 F (36.7 C) 98.2 F (36.8 C)  TempSrc: Oral Oral  SpO2: 95% 94% 96% 94%  Weight:      Height:       Physical Exam HENT:     Head: Normocephalic.  Eyes:     General: Lids are normal.     Conjunctiva/sclera: Conjunctivae normal.  Cardiovascular:     Rate and Rhythm: Normal rate and regular rhythm.     Heart sounds: Normal heart sounds, S1 normal and S2 normal.  Pulmonary:     Breath sounds: No decreased breath sounds, wheezing, rhonchi or rales.  Abdominal:      Palpations: Abdomen is soft.     Tenderness: There is no abdominal tenderness.  Musculoskeletal:     Right lower leg: No swelling.     Left lower leg: No swelling.  Skin:    General: Skin is warm.     Findings: No rash.  Neurological:     Mental Status: She is alert.     Data Reviewed: Hemoglobin 11.6, white blood count 3.2, platelet count 178  Family Communication: Updated niece on the phone  Disposition: Status is: Inpatient Remains inpatient appropriate because: DSS involved  Planned Discharge Destination: To be determined    Time spent: 28 minutes  Author: Charlie Patterson, MD 02/24/2024 3:35 PM  For on call review [email or web address].

## 2024-02-24 NOTE — Plan of Care (Signed)
 Updated wound pictures uploaded in the EMR. Wound care RN placed orders for new dressing change instruction and bed change to specialty bed.   Unable to obtain the bed at this time as they are available to be ordered during dayshift. Asked wound care RN to follow up on the bed with AM shift RN if able and will attempt to pass this on in the report as well. '   Problem: Education: Goal: Knowledge of General Education information will improve Description: Including pain rating scale, medication(s)/side effects and non-pharmacologic comfort measures Outcome: Progressing   Problem: Clinical Measurements: Goal: Ability to maintain clinical measurements within normal limits will improve Outcome: Progressing Goal: Diagnostic test results will improve Outcome: Progressing Goal: Respiratory complications will improve Outcome: Progressing   Problem: Coping: Goal: Level of anxiety will decrease Outcome: Progressing   Problem: Elimination: Goal: Will not experience complications related to bowel motility Outcome: Progressing   Problem: Coping: Goal: Psychosocial and spiritual needs will be supported Outcome: Progressing   Problem: Respiratory: Goal: Will maintain a patent airway Outcome: Progressing

## 2024-02-24 NOTE — TOC Progression Note (Signed)
 Transition of Care [facility]) - Progression Note    Patient Details  Name: [name] MRN: [id number] Date of Birth: 1941/04/22  [facility] CM/SW Contact  Elouise LULLA Capri, RN 02/24/2024, 3:00 PM  Clinical Narrative:     Alert received from Dr. Josette regarding updates on APS referral. CM follow up call placed to DSS caseworker, Nanetta Pay, phone: [phone] regarding a status update on APS report. No answer, CM left message for return call.     Barriers to Discharge: Continued Medical Work up  Expected Discharge Plan and Services      TBD  Social Determinants of Health (SDOH) Interventions SDOH Screenings   Food Insecurity: Patient Unable To Answer (02/08/2024)  Housing: Unknown (02/08/2024)  Transportation Needs: Patient Unable To Answer (02/08/2024)  Utilities: Patient Unable To Answer (02/08/2024)  Financial Resource Strain: Low Risk  (01/25/2023)   Received from [facility]  Social Connections: Patient Unable To Answer (02/08/2024)  Tobacco Use: Low Risk  (02/07/2024)    Readmission Risk Interventions     No data to display

## 2024-02-24 NOTE — Consult Note (Signed)
 WOC Nurse Consult Note: see original consult note 02/10/2024; wounds initially presented as deep tissue pressure injuries that have now evolved  Reason for Consult: worsening wounds  Wound type: 1.  R buttock/ischium  now presenting as a Stage 3 Pressure injury 50% red 50% tan slough  2.  L buttock/ischium has evolved to unstageable Pressure Injury with black tan necrotic tissue  Pressure Injury POA: Yes as deep tissue pressure injuries  Measurement: see nursing flowsheet  Wound bed: see above  Drainage (amount, consistency, odor) per nursing flowsheet  Periwound: erythema  Dressing procedure/placement/frequency: Cleanse B buttocks wounds with Vashe wound cleanser [name] [phone]) do not rinse and allow to air dry.  Apply 1/4 thick layer of Santyl  to wound beds daily, using a Q tip applicator fill in depth of L buttock wound with NS gauze, top R buttock wound with NS gauze.  Top with dry gauze and secure with silicone foam or ABD pad whichever is preferred.    Will write for a thin layer of Desitin to surrounding intact skin 2 times daily and prn soiling.   Patient should be placed on a low air loss mattress for pressure redistribution and moisture management.   WOC team will not follow. Re-consult if further needs arise.   Thank you,    Powell Bar MSN, RN-BC, [employer]

## 2024-02-25 DIAGNOSIS — K5909 Other constipation: Secondary | ICD-10-CM

## 2024-02-25 DIAGNOSIS — K5900 Constipation, unspecified: Secondary | ICD-10-CM | POA: Insufficient documentation

## 2024-02-25 DIAGNOSIS — T796XXS Traumatic ischemia of muscle, sequela: Secondary | ICD-10-CM | POA: Diagnosis not present

## 2024-02-25 DIAGNOSIS — N179 Acute kidney failure, unspecified: Secondary | ICD-10-CM | POA: Diagnosis not present

## 2024-02-25 DIAGNOSIS — R296 Repeated falls: Secondary | ICD-10-CM | POA: Diagnosis not present

## 2024-02-25 DIAGNOSIS — U071 COVID-19: Secondary | ICD-10-CM | POA: Diagnosis not present

## 2024-02-25 MED ORDER — POLYETHYLENE GLYCOL 3350 17 G PO PACK
17.0000 g | PACK | Freq: Once | ORAL | Status: AC
  Administered 2024-02-25: 17 g via ORAL
  Filled 2024-02-25: qty 1

## 2024-02-25 NOTE — Progress Notes (Signed)
 Occupational Therapy Treatment Patient Details Name: [name] MRN: [id number] DOB: April 05, 1941 Today's Date: 02/25/2024   History of present illness [name] is a 83 y.o. female with medical history significant for Hypothyroidism, rheumatoid arthritis on chronic prednisone  and Renflexis  infusions, followed by rheumatology, osteoarthritis, PCP documentation of several falls requiring ED visits, and who currently uses a walker, who is being admitted for rhabdomyolysis after she was found down by a neighbor. Found to be COVID+ on 6/30.   OT comments  Upon entering the room, pt seated in recliner chair calling for assistance to bathroom. Pt performing sit >stand with min guard and use of RW. Pt ambulating 20' into bathroom with RW and CGA progressing to supervision. Pt able to void on commode and perform hygiene while seated without assistance. Pt stands and performs LB clothing management with CGA for balance. Pt stands at sink for hand hygiene with close supervision and returns to recliner chair in same manner as above.       If plan is discharge home, recommend the following:  A little help with walking and/or transfers;A little help with bathing/dressing/bathroom;Assistance with cooking/housework;Direct supervision/assist for medications management;Direct supervision/assist for financial management;Assist for transportation;Supervision due to cognitive status;Help with stairs or ramp for entrance   Equipment Recommendations  Other (comment) (defer to next venue of care)       Precautions / Restrictions Precautions Precautions: Fall Recall of Precautions/Restrictions: Impaired       Mobility Bed Mobility               General bed mobility comments: Not assessed; pt in chair pre/post session    Transfers Overall transfer level: Needs assistance Equipment used: Rolling walker (2 wheels) Transfers: Sit to/from Stand Sit to Stand: Supervision, Contact guard assist                  Balance Overall balance assessment: Needs assistance Sitting-balance support: Feet supported Sitting balance-Leahy Scale: Good     Standing balance support: Bilateral upper extremity supported, During functional activity, Reliant on assistive device for balance Standing balance-Leahy Scale: Fair                             ADL either performed or assessed with clinical judgement   ADL Overall ADL's : Needs assistance/impaired                         Toilet Transfer: Rolling walker (2 wheels);Contact guard assist   Toileting- Clothing Manipulation and Hygiene: Contact guard assist;Sit to/from stand;Cueing for safety       Functional mobility during ADLs: Contact guard assist;Rolling walker (2 wheels)       Vision Patient Visual Report: No change from baseline           Communication Communication Communication: No apparent difficulties   Cognition Arousal: Alert Behavior During Therapy: WFL for tasks assessed/performed Cognition: No apparent impairments                               Following commands: Intact Following commands impaired: Only follows one step commands consistently      Cueing   Cueing Techniques: Verbal cues, Gestural cues             Pertinent Vitals/ Pain       Pain Assessment Pain Assessment: No/denies pain  Frequency  Min 2X/week        Progress Toward Goals  OT Goals(current goals can now be found in the care plan section)  Progress towards OT goals: Progressing toward goals      AM-PAC OT 6 Clicks Daily Activity     Outcome Measure   Help from another person eating meals?: None Help from another person taking care of personal grooming?: None Help from another person toileting, which includes using toliet, bedpan, or urinal?: A Lot Help from another person bathing (including washing, rinsing, drying)?: A Lot Help from another person to put on and taking off  regular upper body clothing?: A Lot Help from another person to put on and taking off regular lower body clothing?: A Lot 6 Click Score: 16    End of Session Equipment Utilized During Treatment: Rolling walker (2 wheels)  OT Visit Diagnosis: Unsteadiness on feet (R26.81);Other abnormalities of gait and mobility (R26.89);Repeated falls (R29.6);Muscle weakness (generalized) (M62.81)   Activity Tolerance Patient tolerated treatment well   Patient Left in bed;with call bell/phone within reach;with bed alarm set   Nurse Communication Mobility status        Time: 1445-1500 OT Time Calculation (min): 15 min  Charges: OT General Charges $OT Visit: 1 Visit OT Treatments $Self Care/Home Management : 8-22 mins  Izetta Claude, MS, OTR/L , CBIS ascom [phone]  02/25/24, 3:42 PM

## 2024-02-25 NOTE — Progress Notes (Signed)
 Mobility Specialist - Progress Note   02/25/24 1549  Mobility  Activity Ambulated with assistance to bathroom;Ambulated with assistance in room  Level of Assistance Contact guard assist, steadying assist  Assistive Device Front wheel walker  Distance Ambulated (ft) 24 ft  Activity Response Tolerated well  Mobility visit 1 Mobility  Mobility Specialist Start Time (ACUTE ONLY) 1515  Mobility Specialist Stop Time (ACUTE ONLY) 1525  Mobility Specialist Time Calculation (min) (ACUTE ONLY) 10 min   Pt sitting on the commode upon entry, utilizing RA. Pt STS to RW MinA, MS completed peri care MaxA. Pt amb to the bed CGA, left supine with alarm set and needs within reach.  America Silvan Mobility Specialist 02/25/24 3:58 PM

## 2024-02-25 NOTE — Progress Notes (Signed)
 Progress Note   Patient: [name] FMW:[id number] DOB: June 02, 1941 DOA: 02/07/2024     17 DOS: the patient was seen and examined on 02/25/2024   Brief hospital course: [name] is a 83 y.o. female with medical history significant for Hypothyroidism, rheumatoid arthritis on chronic prednisone  and Renflexis  infusions, followed by rheumatology. They presented for rhabdomyolysis after she was found down by a neighbor > 24 hours when she fell and could not get up. ED course: normal vitals.  Labs showed normal CBC, Creatinine of 1.85 up from baseline of 1.1 to with bicarb of 19.  Mild LFT elevation with AST 43 and total bilirubin 2.7.  Troponin 17.  Urinalysis with many bacteria and small leuks.  EKG showed sinus arrhythmia at 88 CT head and C-spine without acute injury.  Chest x-ray nonacute showing multiple degenerative changes x-ray of the knee showing moderate right knee effusion among other findings-please see report. Patient treated with 2 L NS bolus and pain medicine.    Completed antibiotic for UTI.  IVF given and resolved rhabdo. Rehab was recommended by PT/OT but insurance company denied. TOC consulted for avenues for safe dispo as she lives alone and felt unsafe to be home alone. APS contacted and started case.   6/30 she was screened positive for COVID. She is currently asymptomatic and feels at baseline. She is ready for dc when safe dispo arranged.     Assessment and Plan: * COVID-19 virus infection Supportive care.  Diagnosed on 6/30.  Multiple falls Lives by herself.  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  TOC awaiting callback from DSS.  Rhabdomyolysis CK peaked at 1195.  This problem has resolved.  AKI (acute kidney injury) (HCC) Creatinine improved from 1.85 down to 0.8  Acute cystitis without hematuria E. coli and Klebsiella growing out of urine culture.  Completed antibiotic course.  Rheumatoid arthritis  involving multiple sites [facility]) On chronic immunosuppressive therapy Patient takes methotrexate  and folic acid .  Patient placed on prednisone  5 mg daily.  Acquired hypothyroidism Continue levothyroxine  at slightly increased dose with TSH being slightly elevated.  Constipation MiraLAX  ordered  Protein-calorie malnutrition, severe Continue supplements  Pressure injury of skin Present on admission.  Wounds restaged by wound care nurse.  They have progressed..  1.  Right buttock/ischium stage III.  2.  Left buttock/ischium now unstageable.  Cleanse bilateral buttock wounds with Vashe wound cleanser [name] [phone]), allowed to air dry.  Apply one quarter thick layer of Santyl  to wound beds daily using a Q-tip applicator to fill on the depth of the left buttock wound with normal saline gauze, top right buttock wound with normal saline gauze.  Top dry gauze and secure with silicone foam or ABD pad.  Memory loss Definitely concerned about her memory and living by herself.  Seen by psychiatry and deemed capable of making medical decisions currently.  TOC to speak with DSS on Monday to try to set up a plan.       Subjective: Patient feels okay.  Offers no complaints.  No pain.  Admitted initially with rhabdomyolysis and acute kidney injury.  Physical Exam: Vitals:   02/25/24 0513 02/25/24 0754 02/25/24 0822 02/25/24 1541  BP: 112/69 (!) 135/56 (!) 112/55 (!) 101/53  Pulse: 72 63 (!) 57 74  Resp:  16 17 14   Temp: 97.8 F (36.6 C) 97.6 F (36.4 C) 97.7 F (36.5 C) 98.2 F (36.8 C)  TempSrc:  Oral  SpO2: 98% 99% 100% 98%  Weight:      Height:       Physical Exam HENT:     Head: Normocephalic.  Eyes:     General: Lids are normal.     Conjunctiva/sclera: Conjunctivae normal.  Cardiovascular:     Rate and Rhythm: Normal rate and regular rhythm.     Heart sounds: Normal heart sounds, S1 normal and S2 normal.  Pulmonary:     Breath sounds: No decreased breath sounds, wheezing,  rhonchi or rales.  Abdominal:     Palpations: Abdomen is soft.     Tenderness: There is no abdominal tenderness.  Musculoskeletal:     Right lower leg: No swelling.     Left lower leg: No swelling.  Skin:    General: Skin is warm.     Findings: No rash.  Neurological:     Mental Status: She is alert.     Data Reviewed: No new data  Family Communication: Updated Beth on the phone  Disposition: Status is: Inpatient Remains inpatient appropriate because: DSS team will help on disposition  Planned Discharge Destination: To be determined based on DSS evaluation    Time spent: 27 minutes  Author: Charlie Patterson, MD 02/25/2024 4:59 PM  For on call review [email or web address].

## 2024-02-25 NOTE — Plan of Care (Signed)
 Patient ambulated to the chair today and to the bathroom. Patient had a BM. Patient eating better today. No pain reported.  Problem: Education: Goal: Knowledge of General Education information will improve Description: Including pain rating scale, medication(s)/side effects and non-pharmacologic comfort measures Outcome: Progressing   Problem: Health Behavior/Discharge Planning: Goal: Ability to manage health-related needs will improve Outcome: Progressing   Problem: Activity: Goal: Risk for activity intolerance will decrease Outcome: Progressing   Problem: Nutrition: Goal: Adequate nutrition will be maintained Outcome: Progressing   Problem: Elimination: Goal: Will not experience complications related to bowel motility Outcome: Progressing   Problem: Pain Managment: Goal: General experience of comfort will improve and/or be controlled Outcome: Progressing

## 2024-02-25 NOTE — Plan of Care (Signed)
   Problem: Clinical Measurements: Goal: Ability to maintain clinical measurements within normal limits will improve Outcome: Progressing Goal: Will remain free from infection Outcome: Progressing Goal: Diagnostic test results will improve Outcome: Progressing Goal: Respiratory complications will improve Outcome: Progressing

## 2024-02-25 NOTE — Assessment & Plan Note (Signed)
 MiraLAX ordered

## 2024-02-25 NOTE — Progress Notes (Signed)
 Nutrition Follow-up  DOCUMENTATION CODES:   Severe malnutrition in context of social or environmental circumstances, Underweight  INTERVENTION:   -Obtain new wt -Continue dysphagia 3 (mechanical soft) for ease of intake -Continue Ensure Plus High Protein po to BID, each supplement provides 350 kcal and 20 grams of protein  -Continue MVI with minerals daily -Continue 500 mg vitamin C  BID -Continue 220 mg zinc  sulfate daily x 14 days -Continue 10,000 units vitamin A  daily  NUTRITION DIAGNOSIS:   Severe Malnutrition related to social / environmental circumstances as evidenced by moderate fat depletion, severe fat depletion, moderate muscle depletion, severe muscle depletion.  Ongoing  GOAL:   Patient will meet greater than or equal to 90% of their needs  Progressing   MONITOR:   PO intake, Supplement acceptance  REASON FOR ASSESSMENT:   Consult Assessment of nutrition requirement/status  ASSESSMENT:   Pt with medical history significant for Hypothyroidism, rheumatoid arthritis on chronic prednisone  and Renflexis  infusions, followed by rheumatology, osteoarthritis, PCP documentation of several falls requiring ED visits, and who currently uses a walker, who is being admitted for rhabdomyolysis after she was found down by a neighbor.  6/30- COVID +  Reviewed I/O's: -100 ml x 24 hours and +570 ml since 02/11/24  UOP: 100 ml x 24 hours  Per CWOCN notes, rt buttock/ischium pressure injury  now presenting as a Stage 3 pressure injury; lt buttock/ischium pressure injury has evolved to unstageable pressure Injury with black tan necrotic tissue.   Pt sleeping soundly at time of visit. She did not arouse to name being called. No family present.   Pt remains on a dysphagia 3 diet. Pt remains with good appetite. Meal completions 80-100%.   No new wt since admission.  TOC working with APS for safe discharge disposition.   Medications reviewed and include vitamin C , vitamin  B-12, ferrous sulfate , folic acid , and vitamin A .   Labs reviewed: Na: 133.    Diet Order:   Diet Order             DIET DYS 3 Fluid consistency: Thin  Diet effective now                   EDUCATION NEEDS:   Education needs have been addressed  Skin:  Skin Assessment: Skin Integrity Issues: Skin Integrity Issues:: Stage II, Unstageable, Stage III DTI: - Stage II: coccyx Stage III: rt buttock/ ischium Unstageable: lt buttock ischium with black tan necrotic tissue  Last BM:  02/25/24 (type 6)  Height:   Ht Readings from Last 1 Encounters:  02/07/24 5' 6 (1.676 m)    Weight:   Wt Readings from Last 1 Encounters:  02/07/24 48.5 kg    Ideal Body Weight:  59.1 kg  BMI:  Body mass index is 17.27 kg/m.  Estimated Nutritional Needs:   Kcal:  1750-1950  Protein:  100-115 grams  Fluid:  1.7-1.9 L    Margery ORN, RD, LDN, CDCES Registered Dietitian III Certified Diabetes Care and Education Specialist If unable to reach this RD, please use RD Inpatient group chat on secure chat between hours of 8am-4 pm daily

## 2024-02-25 NOTE — Progress Notes (Signed)
 Physical Therapy Treatment Patient Details Name: [name] MRN: [id number] DOB: May 29, 1941 Today's Date: 02/25/2024   History of Present Illness [name] is a 83 y.o. female with medical history significant for Hypothyroidism, rheumatoid arthritis on chronic prednisone  and Renflexis  infusions, followed by rheumatology, osteoarthritis, PCP documentation of several falls requiring ED visits, and who currently uses a walker, who is being admitted for rhabdomyolysis after she was found down by a neighbor. Found to be COVID+ on 6/30.    PT Comments  Pt was pleasant and motivated to participate during the session and put forth good effort throughout. Pt required CGA during ambulation of 80 feet with RW and remained steady throughout. Pt stated that she felt more energized today, compared to previous PT session. Pt completed 5xSTS test in 40.43 seconds with BUE support on quads, which puts the pt at an increased risk of falls in the future. Pt will benefit from continued PT services upon discharge to safely address deficits listed in patient problem list for decreased caregiver assistance and eventual return to PLOF.      If plan is discharge home, recommend the following: Assistance with cooking/housework;A little help with bathing/dressing/bathroom;A little help with walking and/or transfers;Supervision due to cognitive status;Assist for transportation;Help with stairs or ramp for entrance   Can travel by private vehicle     Yes  Equipment Recommendations  Other (comment) (TBD at next venue of care)    Recommendations for Other Services       Precautions / Restrictions Precautions Precautions: Fall Recall of Precautions/Restrictions: Impaired Restrictions Weight Bearing Restrictions Per Provider Order: No     Mobility  Bed Mobility               General bed mobility comments: Not assessed; pt in chair pre/post session    Transfers Overall transfer level: Needs  assistance Equipment used: Rolling walker (2 wheels) Transfers: Sit to/from Stand Sit to Stand: Contact guard assist           General transfer comment: Pt required VC's to scoot towards edge of chair and for hand placement before initiating STS, but remained steady throughout and no LOBs occured.    Ambulation/Gait Ambulation/Gait assistance: Contact guard assist Gait Distance (Feet): 80 Feet Assistive device: Rolling walker (2 wheels) Gait Pattern/deviations: Step-to pattern, Decreased step length - right, Decreased step length - left, Decreased stride length, Trunk flexed Gait velocity: decreased     General Gait Details: Pt demonstrated flexed trunk over RW. VC's were utilized to encourage maintaining RW within BOS with minimal pt carryover, but remained steady throughout and no LOBs occured.   Stairs             Wheelchair Mobility     Tilt Bed    Modified Rankin (Stroke Patients Only)       Balance Overall balance assessment: Needs assistance Sitting-balance support: Feet supported Sitting balance-Leahy Scale: Good     Standing balance support: Bilateral upper extremity supported, During functional activity, Reliant on assistive device for balance Standing balance-Leahy Scale: Fair Standing balance comment: Pt reliant on RW to maintain balance during stance and ambulation, but remained steady throughout and no LOBs occured                            Communication Communication Communication: No apparent difficulties  Cognition Arousal: Alert Behavior During Therapy: WFL for tasks assessed/performed   PT - Cognitive impairments: No apparent impairments  Following commands: Intact Following commands impaired: Only follows one step commands consistently    Cueing Cueing Techniques: Verbal cues, Gestural cues  Exercises Other Exercises Other Exercises: 5x STS Test completed in 40.43 seconds with BUE  support on quads. 14.8 seconds in norm for community ambulating adult in 8th decade of life, which puts this pt at an increased fall risk    General Comments        Pertinent Vitals/Pain Pain Assessment Pain Assessment: No/denies pain    Home Living                          Prior Function            PT Goals (current goals can now be found in the care plan section) Progress towards PT goals: Progressing toward goals    Frequency    Min 2X/week      PT Plan      Co-evaluation              AM-PAC PT 6 Clicks Mobility   Outcome Measure  Help needed turning from your back to your side while in a flat bed without using bedrails?: A Little Help needed moving from lying on your back to sitting on the side of a flat bed without using bedrails?: A Little Help needed moving to and from a bed to a chair (including a wheelchair)?: A Little Help needed standing up from a chair using your arms (e.g., wheelchair or bedside chair)?: A Little Help needed to walk in hospital room?: A Little Help needed climbing 3-5 steps with a railing? : A Lot 6 Click Score: 17    End of Session Equipment Utilized During Treatment: Gait belt Activity Tolerance: Patient tolerated treatment well Patient left: in chair;with call bell/phone within reach;with chair alarm set Nurse Communication: Mobility status PT Visit Diagnosis: Other abnormalities of gait and mobility (R26.89);Muscle weakness (generalized) (M62.81);Repeated falls (R29.6);Difficulty in walking, not elsewhere classified (R26.2);Unsteadiness on feet (R26.81)     Time: 8593-8564 PT Time Calculation (min) (ACUTE ONLY): 29 min  Charges:    $Gait Training: 8-22 mins $Therapeutic Exercise: 8-22 mins PT General Charges $$ ACUTE PT VISIT: 1 Visit                     Leontine Ingles, SPT 02/25/24, 3:14 PM

## 2024-02-26 DIAGNOSIS — U071 COVID-19: Secondary | ICD-10-CM | POA: Diagnosis not present

## 2024-02-26 LAB — CBC
HCT: 35.1 % — ABNORMAL LOW (ref 36.0–46.0)
Hemoglobin: 11.6 g/dL — ABNORMAL LOW (ref 12.0–15.0)
MCH: 30.1 pg (ref 26.0–34.0)
MCHC: 33 g/dL (ref 30.0–36.0)
MCV: 90.9 fL (ref 80.0–100.0)
Platelets: 215 K/uL (ref 150–400)
RBC: 3.86 MIL/uL — ABNORMAL LOW (ref 3.87–5.11)
RDW: 14.6 % (ref 11.5–15.5)
WBC: 3.4 K/uL — ABNORMAL LOW (ref 4.0–10.5)
nRBC: 0 % (ref 0.0–0.2)

## 2024-02-26 LAB — BASIC METABOLIC PANEL WITH GFR
Anion gap: 8 (ref 5–15)
BUN: 31 mg/dL — ABNORMAL HIGH (ref 8–23)
CO2: 30 mmol/L (ref 22–32)
Calcium: 8.8 mg/dL — ABNORMAL LOW (ref 8.9–10.3)
Chloride: 99 mmol/L (ref 98–111)
Creatinine, Ser: 0.73 mg/dL (ref 0.44–1.00)
GFR, Estimated: >60 mL/min (ref >60)
Glucose, Bld: 82 mg/dL (ref 70–99)
Potassium: 4 mmol/L (ref 3.5–5.1)
Sodium: 137 mmol/L (ref 135–145)

## 2024-02-26 MED ORDER — SENNOSIDES-DOCUSATE SODIUM 8.6-50 MG PO TABS
1.0000 | ORAL_TABLET | Freq: Two times a day (BID) | ORAL | Status: DC
  Administered 2024-02-26 – 2024-03-11 (×26): 1 via ORAL
  Filled 2024-02-26 (×27): qty 1

## 2024-02-26 MED ORDER — PNEUMOCOCCAL 20-VAL CONJ VACC 0.5 ML IM SUSY
0.5000 mL | PREFILLED_SYRINGE | INTRAMUSCULAR | Status: AC
  Administered 2024-02-27: 0.5 mL via INTRAMUSCULAR
  Filled 2024-02-26: qty 0.5

## 2024-02-26 NOTE — Progress Notes (Signed)
 Progress Note   Patient: [name] FMW:[id number] DOB: 02-Jan-1941 DOA: 02/07/2024     18 DOS: the patient was seen and examined on 02/26/2024   Brief hospital course: [name] is a 83 y.o. female with medical history significant for Hypothyroidism, rheumatoid arthritis on chronic prednisone  and Renflexis  infusions, followed by rheumatology. They presented for rhabdomyolysis after she was found down by a neighbor > 24 hours when she fell and could not get up. ED course: normal vitals.  Labs showed normal CBC, Creatinine of 1.85 up from baseline of 1.1 to with bicarb of 19.  Mild LFT elevation with AST 43 and total bilirubin 2.7.  Troponin 17.  Urinalysis with many bacteria and small leuks.  EKG showed sinus arrhythmia at 88 CT head and C-spine without acute injury.  Chest x-ray nonacute showing multiple degenerative changes x-ray of the knee showing moderate right knee effusion among other findings-please see report. Patient treated with 2 L NS bolus and pain medicine.     Completed antibiotic for UTI.  IVF given and resolved rhabdo. Rehab was recommended by PT/OT but insurance company denied. TOC consulted for avenues for safe dispo as she lives alone and felt unsafe to be home alone. APS contacted and started case.    6/30 she was screened positive for COVID. She is currently asymptomatic and feels at baseline. She is ready for dc when safe dispo arranged.    I assumed care on 02/26/24. Patient remains medically stable for discharge pending safe disposition plan.   Assessment and Plan: * COVID-19 virus infection Supportive care.  Diagnosed on 6/30.  Multiple falls Lives by herself.  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  TOC awaiting callback from DSS.  Rhabdomyolysis CK peaked at 1195.  This problem has resolved.  AKI (acute kidney injury) (HCC) Creatinine improved from 1.85 down to 0.8  Acute cystitis without  hematuria E. coli and Klebsiella growing out of urine culture.  Completed antibiotic course.  Rheumatoid arthritis involving multiple sites [facility]) On chronic immunosuppressive therapy Patient takes methotrexate  and folic acid .  Patient placed on prednisone  5 mg daily.  Acquired hypothyroidism Continue levothyroxine  at slightly increased dose with TSH being slightly elevated.  Constipation MiraLAX  ordered  Protein-calorie malnutrition, severe Continue supplements  Pressure injury of skin Present on admission.  Wounds restaged by wound care nurse.  They have progressed..  1.  Right buttock/ischium stage III.  2.  Left buttock/ischium now unstageable.  Cleanse bilateral buttock wounds with Vashe wound cleanser [name]), allowed to air dry.  Apply one quarter thick layer of Santyl  to wound beds daily using a Q-tip applicator to fill on the depth of the left buttock wound with normal saline gauze, top right buttock wound with normal saline gauze.  Top dry gauze and secure with silicone foam or ABD pad.  Memory loss Definitely concerned about her memory and living by herself.  Seen by psychiatry and deemed capable of making medical decisions currently.  TOC to speak with DSS on Monday to try to set up a plan.        Subjective: pt was up in recliner when seen on rounds this AM.  She reports feeling well, denies any problems or complaints.  Denies ever having symptoms from Covid.   Physical Exam: Vitals:   02/25/24 1700 02/25/24 1930 02/26/24 0406 02/26/24 0836  BP:  108/61 (!) 134/59 (!) 107/56  Pulse:  85 64 64  Resp:    16  Temp:  98.4 F (36.9 C) 97.6 F (36.4 C) 98.2 F (36.8 C)  TempSrc:  Oral Oral   SpO2:  100% 99% 100%  Weight: 41.8 kg     Height:       General exam: awake, alert, no acute distress, frail HEENT: moist mucus membranes, hearing grossly normal  Respiratory system: CTAB, no wheezes, rales or rhonchi, normal respiratory effort. Cardiovascular  system: normal S1/S2, RRR Gastrointestinal system: soft, NT, ND, +bowel sounds Central nervous system: A&O x 3. no gross focal neurologic deficits, normal speech Extremities: moves all, no edema, normal tone Skin: dry, intact, normal temperature Psychiatry: normal mood, congruent affect   Data Reviewed:  Notable labs -- Normal BMP except BUN 31 and Ca 8.8 CBC with WBC 3.4, hbg stable 11.6  Family Communication: None. No new medical updates to provide at this time.   Disposition: Status is: Inpatient Remains inpatient appropriate because: awaiting SNF placement   Planned Discharge Destination: Skilled nursing facility    Time spent: 35 minutes  Author: Burnard DELENA Cunning, DO 02/26/2024 3:21 PM  For on call review [email or web address].

## 2024-02-26 NOTE — Progress Notes (Addendum)
 Physical Therapy Treatment Patient Details Name: [name] MRN: [id number] DOB: 28-Feb-1941 Today's Date: 02/26/2024   History of Present Illness [name] is a 83 y.o. female with medical history significant for Hypothyroidism, rheumatoid arthritis on chronic prednisone  and Renflexis  infusions, followed by rheumatology, osteoarthritis, PCP documentation of several falls requiring ED visits, and who currently uses a walker, who is being admitted for rhabdomyolysis after she was found down by a neighbor. Found to be COVID+ on 6/30.    PT Comments  Patient received in bed, eating breakfast. She is agreeable to PT session. Patient is mod I with bed mobility. She stands with cga/min A ( from low toilet). Ambulated 30 feet in room with RW. Distance is limited this session as she wanted to continue eating breakfast. Patient will continue to benefit from skilled PT to improve strength and safety with mobility. Patient's goals updated this session.      If plan is discharge home, recommend the following: Assistance with cooking/housework;A little help with bathing/dressing/bathroom;A little help with walking and/or transfers;Supervision due to cognitive status;Assist for transportation;Help with stairs or ramp for entrance   Can travel by private vehicle     Yes  Equipment Recommendations  None recommended by PT    Recommendations for Other Services       Precautions / Restrictions Precautions Precautions: Fall Recall of Precautions/Restrictions: Impaired Restrictions Weight Bearing Restrictions Per Provider Order: No     Mobility  Bed Mobility Overal bed mobility: Modified Independent Bed Mobility: Supine to Sit     Supine to sit: Modified independent (Device/Increase time)          Transfers Overall transfer level: Needs assistance Equipment used: Rolling walker (2 wheels) Transfers: Sit to/from Stand Sit to Stand: Contact guard assist           General transfer  comment: mod I for bed mobility    Ambulation/Gait Ambulation/Gait assistance: Contact guard assist, Supervision Gait Distance (Feet): 30 Feet Assistive device: Rolling walker (2 wheels) Gait Pattern/deviations: Narrow base of support, Decreased step length - right, Decreased step length - left, Decreased stride length Gait velocity: decreased     General Gait Details: patient ambulating well with RW, supervision to cga.   Stairs             Wheelchair Mobility     Tilt Bed    Modified Rankin (Stroke Patients Only)       Balance Overall balance assessment: Needs assistance Sitting-balance support: Feet supported Sitting balance-Leahy Scale: Normal     Standing balance support: Bilateral upper extremity supported, During functional activity, Reliant on assistive device for balance Standing balance-Leahy Scale: Good Standing balance comment: No LOB, requires RW                            Communication Communication Communication: No apparent difficulties  Cognition Arousal: Alert Behavior During Therapy: WFL for tasks assessed/performed   PT - Cognitive impairments: No apparent impairments                         Following commands: Intact Following commands impaired: Only follows one step commands consistently    Cueing Cueing Techniques: Verbal cues, Gestural cues  Exercises      General Comments        Pertinent Vitals/Pain Pain Assessment Pain Assessment: Faces Faces Pain Scale: Hurts a little bit Pain Location: B feet- prefers to walk with  shoes on Pain Descriptors / Indicators: Discomfort Pain Intervention(s): Monitored during session, Repositioned, Other (comment) (donned shoes for ambulation)    Home Living                          Prior Function            PT Goals (current goals can now be found in the care plan section) Acute Rehab PT Goals Patient Stated Goal: walk more PT Goal Formulation: With  patient Time For Goal Achievement: 03/11/24 Potential to Achieve Goals: Good Progress towards PT goals: Progressing toward goals    Frequency    Min 2X/week      PT Plan      Co-evaluation              AM-PAC PT 6 Clicks Mobility   Outcome Measure  Help needed turning from your back to your side while in a flat bed without using bedrails?: A Little Help needed moving from lying on your back to sitting on the side of a flat bed without using bedrails?: A Little Help needed moving to and from a bed to a chair (including a wheelchair)?: A Little Help needed standing up from a chair using your arms (e.g., wheelchair or bedside chair)?: A Little Help needed to walk in hospital room?: A Little Help needed climbing 3-5 steps with a railing? : A Lot 6 Click Score: 17    End of Session   Activity Tolerance: Patient tolerated treatment well Patient left: in chair;with call bell/phone within reach;with chair alarm set Nurse Communication: Mobility status PT Visit Diagnosis: Muscle weakness (generalized) (M62.81);Difficulty in walking, not elsewhere classified (R26.2);Repeated falls (R29.6);Unsteadiness on feet (R26.81)     Time: 9058-9042 PT Time Calculation (min) (ACUTE ONLY): 16 min  Charges:    $Gait Training: 8-22 mins PT General Charges $$ ACUTE PT VISIT: 1 Visit                     Ashlon Lottman, PT, GCS 02/26/24,11:31 AM

## 2024-02-26 NOTE — Plan of Care (Signed)
 The patient continues on droplet precautions for Covid w/o s/s of acute distress noted.

## 2024-02-26 NOTE — Progress Notes (Signed)
 Occupational Therapy Treatment Patient Details Name: [name] MRN: [id number] DOB: 09-21-1940 Today's Date: 02/26/2024   History of present illness [name] is a 83 y.o. female with medical history significant for Hypothyroidism, rheumatoid arthritis on chronic prednisone  and Renflexis  infusions, followed by rheumatology, osteoarthritis, PCP documentation of several falls requiring ED visits, and who currently uses a walker, who is being admitted for rhabdomyolysis after she was found down by a neighbor. Found to be COVID+ on 6/30.   OT comments  Pt seen for OT treatment on this date. Upon arrival to room pt seated in recliner chair, agreeable to tx. Patient completed doffing/donning socks while seated with Min A only to place on L foot over toes otherwise SBA.  Pt requires CGA for sit to stand transfers with cuing for safety with hand foot placement.  Therapist facilitated functional mobility in living spaces of bedroom at RW level with skilled instruction on safe RW management to reduce risk of LOB or falls. At end of session, patient requested to return to bed.  Patient completed with Supervision overall.  Bed alarm activated and call button within close reach.  Pt making good progress toward goals, will continue to follow POC. Discharge recommendation remains appropriate.        If plan is discharge home, recommend the following:      Equipment Recommendations       Recommendations for Other Services      Precautions / Restrictions Precautions Precautions: Fall Recall of Precautions/Restrictions: Impaired       Mobility Bed Mobility Overal bed mobility: Modified Independent         Sit to supine: Supervision        Transfers Overall transfer level: Needs assistance Equipment used: Rolling walker (2 wheels) Transfers: Sit to/from Stand Sit to Stand: Contact guard assist                 Balance Overall balance assessment: Needs assistance   Sitting  balance-Leahy Scale: Normal     Standing balance support: Bilateral upper extremity supported, During functional activity, Reliant on assistive device for balance Standing balance-Leahy Scale: Good                             ADL either performed or assessed with clinical judgement   ADL Overall ADL's : Needs assistance/impaired                     Lower Body Dressing: Minimal assistance;Sit to/from stand   Toilet Transfer: Rolling walker (2 wheels);Supervision/safety Statistician Details (indicate cue type and reason): simulation                Extremity/Trunk Assessment Upper Extremity Assessment Upper Extremity Assessment: Generalized weakness RUE Deficits / Details: R hand deformities, R>L LUE Deficits / Details: L hand deformities            Vision       Perception     Praxis     Communication Communication Communication: No apparent difficulties   Cognition Arousal: Alert Behavior During Therapy: WFL for tasks assessed/performed                                 Following commands: Intact Following commands impaired: Only follows one step commands consistently      Cueing      Exercises Other Exercises Other  Exercises: Education on safety with hand/foot placement for functional transfers.    Shoulder Instructions       General Comments      Pertinent Vitals/ Pain       Pain Assessment Pain Assessment: No/denies pain  Home Living                                          Prior Functioning/Environment              Frequency  Min 2X/week        Progress Toward Goals  OT Goals(current goals can now be found in the care plan section)  Progress towards OT goals: Progressing toward goals     Plan      Co-evaluation                 AM-PAC OT 6 Clicks Daily Activity     Outcome Measure                    End of Session Equipment Utilized During  Treatment: Rolling walker (2 wheels)  OT Visit Diagnosis: Unsteadiness on feet (R26.81);Other abnormalities of gait and mobility (R26.89);Repeated falls (R29.6);Muscle weakness (generalized) (M62.81)   Activity Tolerance Patient tolerated treatment well   Patient Left in bed;with call bell/phone within reach;with bed alarm set   Nurse Communication          Time: 8780-8759 OT Time Calculation (min): 21 min  Charges: OT General Charges $OT Visit: 1 Visit  Harlene Sharps OTR/L   Harlene LITTIE Sharps 02/26/2024, 12:50 PM

## 2024-02-27 DIAGNOSIS — U071 COVID-19: Secondary | ICD-10-CM | POA: Diagnosis not present

## 2024-02-27 NOTE — Progress Notes (Signed)
 Occupational Therapy Treatment Patient Details Name: [name] MRN: [id number] DOB: 01/03/1941 Today's Date: 02/27/2024   History of present illness [name] is a 83 y.o. female with medical history significant for Hypothyroidism, rheumatoid arthritis on chronic prednisone  and Renflexis  infusions, followed by rheumatology, osteoarthritis, PCP documentation of several falls requiring ED visits, and who currently uses a walker, who is being admitted for rhabdomyolysis after she was found down by a neighbor. Found to be COVID+ on 6/30.   OT comments  [name] was seen for OT treatment on this date. Upon arrival to room pt semi-supine in bed finishing up lunch, agreeable to OT Tx session. OT facilitated ADL management with education and assistance as described below. See ADL section for additional details regarding occupational performance. Pt continues to be functionally limited by decreased cognition, decreased activity tolerance, and generalized weakness. Pt return verbalizes understanding of education provided t/o session. Pt is progressing toward OT goals and continues to benefit from skilled OT services to maximize return to PLOF and minimize risk of future falls, injury, caregiver burden, and readmission. Will continue to follow POC as written. Discharge recommendation remains appropriate.        If plan is discharge home, recommend the following:  A little help with walking and/or transfers;A little help with bathing/dressing/bathroom;Assistance with cooking/housework;Direct supervision/assist for medications management;Direct supervision/assist for financial management;Assist for transportation;Supervision due to cognitive status;Help with stairs or ramp for entrance   Equipment Recommendations  Other (comment) (defer)    Recommendations for Other Services      Precautions / Restrictions Precautions Precautions: Fall Recall of Precautions/Restrictions:  Impaired Restrictions Weight Bearing Restrictions Per Provider Order: No       Mobility Bed Mobility Overal bed mobility: Needs Assistance Bed Mobility: Supine to Sit, Sit to Supine Rolling: Used rails, Supervision   Supine to sit: Min assist     General bed mobility comments: MIN A to bring feet over EOB during sit>sup this date.    Transfers Overall transfer level: Needs assistance Equipment used: Rolling walker (2 wheels) Transfers: Sit to/from Stand Sit to Stand: Contact guard assist     Step pivot transfers: Contact guard assist           Balance Overall balance assessment: Needs assistance Sitting-balance support: Feet supported Sitting balance-Leahy Scale: Good     Standing balance support: During functional activity, Reliant on assistive device for balance, No upper extremity supported Standing balance-Leahy Scale: Good Standing balance comment: No LOB, requires RW with dynamic standing balance. Able to stand at asink without UE support to complete ADL management.                           ADL either performed or assessed with clinical judgement   ADL Overall ADL's : Needs assistance/impaired     Grooming: Standing;Wash/dry face;Oral care;Cueing for sequencing Grooming Details (indicate cue type and reason): Standing at sink with close CGA for safety. Min cueing for sequencing and upright posture t/o.                             Functional mobility during ADLs: Contact guard assist;Rolling walker (2 wheels) General ADL Comments: Cueing for safe transfer technique, falls prevention, and safe use of RW t/o session.    Extremity/Trunk Assessment Upper Extremity Assessment Upper Extremity Assessment: Generalized weakness       Cervical / Trunk Assessment Cervical / Trunk  Assessment: Kyphotic    Vision Patient Visual Report: No change from baseline     Perception     Praxis     Communication Communication Communication:  No apparent difficulties   Cognition Arousal: Alert Behavior During Therapy: WFL for tasks assessed/performed Cognition: History of cognitive impairments             OT - Cognition Comments: Difficulty with task sequencing and initiation. Tends to defer to therapist for direction even mid tasks. Asks What do you think? When asked questions about functional activities.                 Following commands: Intact Following commands impaired: Follows one step commands with increased time      Cueing   Cueing Techniques: Verbal cues, Gestural cues  Exercises Other Exercises Other Exercises: OT facilitated ADL management with education and assist as described above.    Shoulder Instructions       General Comments      Pertinent Vitals/ Pain       Pain Assessment Pain Assessment: No/denies pain  Home Living                                          Prior Functioning/Environment              Frequency  Min 2X/week        Progress Toward Goals  OT Goals(current goals can now be found in the care plan section)  Progress towards OT goals: Progressing toward goals  Acute Rehab OT Goals Patient Stated Goal: to go home OT Goal Formulation: With patient Time For Goal Achievement: 02/27/24 Potential to Achieve Goals: Good  Plan      Co-evaluation                 AM-PAC OT 6 Clicks Daily Activity     Outcome Measure   Help from another person eating meals?: A Little Help from another person taking care of personal grooming?: A Little Help from another person toileting, which includes using toliet, bedpan, or urinal?: A Lot Help from another person bathing (including washing, rinsing, drying)?: A Lot Help from another person to put on and taking off regular upper body clothing?: A Lot Help from another person to put on and taking off regular lower body clothing?: A Lot 6 Click Score: 14    End of Session Equipment Utilized  During Treatment: Rolling walker (2 wheels)  OT Visit Diagnosis: Unsteadiness on feet (R26.81);Other abnormalities of gait and mobility (R26.89);Repeated falls (R29.6);Muscle weakness (generalized) (M62.81)   Activity Tolerance Patient tolerated treatment well   Patient Left in bed;with call bell/phone within reach;with bed alarm set   Nurse Communication          Time: 8491-8477 OT Time Calculation (min): 14 min  Charges: OT General Charges $OT Visit: 1 Visit OT Treatments $Self Care/Home Management : 8-22 mins  Jhonny Pelton, M.S., OTR/L 02/27/24, 3:58 PM

## 2024-02-27 NOTE — Plan of Care (Signed)
   Problem: Education: Goal: Knowledge of General Education information will improve Description: Including pain rating scale, medication(s)/side effects and non-pharmacologic comfort measures Outcome: Progressing   Problem: Health Behavior/Discharge Planning: Goal: Ability to manage health-related needs will improve Outcome: Progressing   Problem: Nutrition: Goal: Adequate nutrition will be maintained Outcome: Progressing   Problem: Coping: Goal: Level of anxiety will decrease Outcome: Progressing   Problem: Pain Managment: Goal: General experience of comfort will improve and/or be controlled Outcome: Progressing

## 2024-02-27 NOTE — Progress Notes (Signed)
 Progress Note   Patient: [name] FMW:[id number] DOB: [birth year] DOA: 02/07/2024     19 DOS: the patient was seen and examined on 02/27/2024   Brief hospital course: [name] is a 83 y.o. female with medical history significant for Hypothyroidism, rheumatoid arthritis on chronic prednisone  and Renflexis  infusions, followed by rheumatology. They presented for rhabdomyolysis after she was found down by a neighbor > 24 hours when she fell and could not get up. ED course: normal vitals.  Labs showed normal CBC, Creatinine of 1.85 up from baseline of 1.1 to with bicarb of 19.  Mild LFT elevation with AST 43 and total bilirubin 2.7.  Troponin 17.  Urinalysis with many bacteria and small leuks.  EKG showed sinus arrhythmia at 88 CT head and C-spine without acute injury.  Chest x-ray nonacute showing multiple degenerative changes x-ray of the knee showing moderate right knee effusion among other findings-please see report. Patient treated with 2 L NS bolus and pain medicine.     Completed antibiotic for UTI.  IVF given and resolved rhabdo. Rehab was recommended by PT/OT but insurance company denied. TOC consulted for avenues for safe dispo as she lives alone and felt unsafe to be home alone. APS contacted and started case.    6/30 she was screened positive for COVID. She is currently asymptomatic and feels at baseline. She is ready for dc when safe dispo arranged.    I assumed care on 02/26/24. Patient remains medically stable for discharge pending safe disposition plan.   Assessment and Plan: * COVID-19 virus infection Supportive care.  Diagnosed on 6/30.  Multiple falls Lives by herself.  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  TOC awaiting callback from DSS.  Rhabdomyolysis CK peaked at 1195.  This problem has resolved.  AKI (acute kidney injury) (HCC) Creatinine improved from 1.85 down to 0.8  Acute cystitis without  hematuria E. coli and Klebsiella growing out of urine culture.  Completed antibiotic course.  Rheumatoid arthritis involving multiple sites [facility]) On chronic immunosuppressive therapy Patient takes methotrexate  and folic acid .  Patient placed on prednisone  5 mg daily.  Acquired hypothyroidism Continue levothyroxine  at slightly increased dose with TSH being slightly elevated.  Constipation MiraLAX  ordered  Protein-calorie malnutrition, severe Continue supplements  Pressure injury of skin Present on admission.  Wounds restaged by wound care nurse.  They have progressed..  1.  Right buttock/ischium stage III.  2.  Left buttock/ischium now unstageable.  Cleanse bilateral buttock wounds with Vashe wound cleanser [name] [phone]), allowed to air dry.  Apply one quarter thick layer of Santyl  to wound beds daily using a Q-tip applicator to fill on the depth of the left buttock wound with normal saline gauze, top right buttock wound with normal saline gauze.  Top dry gauze and secure with silicone foam or ABD pad.  Memory loss Definitely concerned about her memory and living by herself.  Seen by psychiatry and deemed capable of making medical decisions currently.  TOC to speak with DSS on Monday to try to set up a plan.        Subjective: pt was up in recliner when seen on rounds this AM.  She reports being cold but otherwise says she feels fine. No acute complaints or events reported.   Physical Exam: Vitals:   02/26/24 1603 02/26/24 2101 02/27/24 0621 02/27/24 0753  BP: (!) 102/51 (!) 103/42 (!) 102/50 (!) 123/58  Pulse: 71 73 68  64  Resp: 18 18 18 16   Temp: 98.8 F (37.1 C) 98.1 F (36.7 C) (!) 97.1 F (36.2 C) 98.1 F (36.7 C)  TempSrc:      SpO2: 96% 97% 96% 98%  Weight:      Height:       General exam: awake, alert, no acute distress, frail HEENT: moist mucus membranes, hearing grossly normal  Respiratory system: on room air, normal respiratory effort. Cardiovascular  system: RRR, no peripheral edema Gastrointestinal system: soft, NT, ND, +bowel sounds Central nervous system: A&O x 3. no gross focal neurologic deficits, normal speech Extremities: moves all, no edema, normal tone Skin: dry, intact, normal temperature Psychiatry: normal mood, congruent affect   Data Reviewed:  Notable labs -- Normal BMP except BUN 31 and Ca 8.8 CBC with WBC 3.4, hbg stable 11.6  Family Communication: None. No new medical updates to provide at this time.   Disposition: Status is: Inpatient Remains inpatient appropriate because: awaiting SNF placement   Planned Discharge Destination: Skilled nursing facility    Time spent: 35 minutes  Author: Burnard DELENA Cunning, DO 02/27/2024 2:17 PM  For on call review [email or web address].

## 2024-02-27 NOTE — TOC Progression Note (Signed)
 Transition of Care [facility]) - Progression Note    Patient Details  Name: [name] MRN: [id number] Date of Birth: [birth year]  Transition of Care [facility]) CM/SW Contact  Elouise LULLA Capri, RN 02/27/2024, 4:35 PM  Clinical Narrative:     CM returned call to Nanetta Pay, [facility] DSS, phone: [phone] regarding APS referral. No answer, CM left message for return call.     Barriers to Discharge: Continued Medical Work up  Expected Discharge Plan and Services    SNF   Social Determinants of Health (SDOH) Interventions SDOH Screenings   Food Insecurity: Patient Unable To Answer (02/08/2024)  Housing: Unknown (02/08/2024)  Transportation Needs: Patient Unable To Answer (02/08/2024)  Utilities: Patient Unable To Answer (02/08/2024)  Financial Resource Strain: Low Risk  (01/25/2023)   Received from [facility]  Social Connections: Patient Unable To Answer (02/08/2024)  Tobacco Use: Low Risk  (02/07/2024)    Readmission Risk Interventions     No data to display

## 2024-02-27 NOTE — Plan of Care (Signed)
 The patient continues on droplet precautions for Covid positive. No s/s of acute distress noted.

## 2024-02-28 DIAGNOSIS — U071 COVID-19: Secondary | ICD-10-CM | POA: Diagnosis not present

## 2024-02-28 LAB — URINALYSIS, COMPLETE (UACMP) WITH MICROSCOPIC
Bilirubin Urine: NEGATIVE
Glucose, UA: NEGATIVE mg/dL
Hgb urine dipstick: NEGATIVE
Ketones, ur: NEGATIVE mg/dL
Nitrite: NEGATIVE
Protein, ur: NEGATIVE mg/dL
Specific Gravity, Urine: 1.02 (ref 1.005–1.030)
pH: 5 (ref 5.0–8.0)

## 2024-02-28 NOTE — Plan of Care (Signed)
  Problem: Education: Goal: Knowledge of General Education information will improve Description: Including pain rating scale, medication(s)/side effects and non-pharmacologic comfort measures Outcome: Progressing   Problem: Health Behavior/Discharge Planning: Goal: Ability to manage health-related needs will improve Outcome: Progressing   Problem: Clinical Measurements: Goal: Ability to maintain clinical measurements within normal limits will improve Outcome: Progressing Goal: Will remain free from infection Outcome: Progressing Goal: Diagnostic test results will improve Outcome: Progressing Goal: Respiratory complications will improve Outcome: Progressing Goal: Cardiovascular complication will be avoided Outcome: Progressing   Problem: Activity: Goal: Risk for activity intolerance will decrease Outcome: Progressing   Problem: Nutrition: Goal: Adequate nutrition will be maintained Outcome: Progressing   Problem: Coping: Goal: Level of anxiety will decrease Outcome: Progressing   Problem: Elimination: Goal: Will not experience complications related to bowel motility Outcome: Progressing Goal: Will not experience complications related to urinary retention Outcome: Progressing   Problem: Pain Managment: Goal: General experience of comfort will improve and/or be controlled Outcome: Progressing   Problem: Safety: Goal: Ability to remain free from injury will improve Outcome: Progressing   Problem: Skin Integrity: Goal: Risk for impaired skin integrity will decrease Outcome: Progressing   Problem: Education: Goal: Knowledge of risk factors and measures for prevention of condition will improve Outcome: Progressing   Problem: Coping: Goal: Psychosocial and spiritual needs will be supported Outcome: Progressing   Problem: Respiratory: Goal: Will maintain a patent airway Outcome: Progressing Goal: Complications related to the disease process, condition or treatment  will be avoided or minimized Outcome: Progressing

## 2024-02-28 NOTE — TOC Progression Note (Addendum)
 Transition of Care [facility]) - Progression Note    Patient Details  Name: [name] MRN: [id number] Date of Birth: 19-May-1941  Transition of Care [facility]) CM/SW Contact  Elouise LULLA Capri, RN 02/28/2024, 12:36 PM  Clinical Narrative:     CM follow up call placed to Kylie, DSS Supervisor regarding APS referral and request for face to face DSS SW assessment, capacity, and placement.  Per Minta, will send DSS SW for face to face assessment. CM to patient's room, patient disoriented to place, time and situation. Patient stated is agreeable to SNF. Patient states did not know she had wounds on buttocks. Patient stated did not know had roaches in home. Patient does not know who can assist patient at home for bathing, grooming, wound dressing changes or feeding. )Patient stated did not know if receiving any income monthly. [facility] DSS SW arrives to room for face to face assessment  while CM in room interviewing patient. CM follow up call placed to [facility], DSS Supervisor regarding guardianship and SNF placement. Per Minta, will review patient records for assessment of patient financials.  CM call to Therisa, Admissions, [facility] regarding SNF referral. No answer, CM left message for return call. CM call to Osgood, Admissions, [employer] [facility], [place] regarding accepting bed offer. Per Daril, requests updated clinicals. CM faxed updated clinicals via Epic.  Email from Kylie, DSS, patient  receives [phone] monthly. CM secure email to Vernell ORN, [facility] Financial counselor regarding initiating Medicaid application assistance.    Barriers to Discharge: Continued Medical Work up  Expected Discharge Plan and Services    SNF  Social Determinants of Health (SDOH) Interventions SDOH Screenings   Food Insecurity: Patient Unable To Answer (02/08/2024)  Housing: Unknown (02/08/2024)  Transportation Needs: Patient Unable To Answer (02/08/2024)  Utilities: Patient Unable To Answer (02/08/2024)   Financial Resource Strain: Low Risk  (01/25/2023)   Received from [facility]  Social Connections: Patient Unable To Answer (02/08/2024)  Tobacco Use: Low Risk  (02/07/2024)    Readmission Risk Interventions     No data to display

## 2024-02-28 NOTE — Plan of Care (Signed)
  Problem: Clinical Measurements: Goal: Ability to maintain clinical measurements within normal limits will improve Outcome: Progressing Goal: Will remain free from infection Outcome: Progressing Goal: Diagnostic test results will improve Outcome: Progressing Goal: Respiratory complications will improve Outcome: Progressing Goal: Cardiovascular complication will be avoided Outcome: Progressing   Problem: Nutrition: Goal: Adequate nutrition will be maintained Outcome: Progressing   Problem: Coping: Goal: Level of anxiety will decrease Outcome: Progressing   Problem: Pain Managment: Goal: General experience of comfort will improve and/or be controlled Outcome: Progressing   Problem: Safety: Goal: Ability to remain free from injury will improve Outcome: Progressing   Problem: Skin Integrity: Goal: Risk for impaired skin integrity will decrease Outcome: Progressing   Problem: Education: Goal: Knowledge of General Education information will improve Description: Including pain rating scale, medication(s)/side effects and non-pharmacologic comfort measures Outcome: Not Progressing   Problem: Health Behavior/Discharge Planning: Goal: Ability to manage health-related needs will improve Outcome: Not Progressing   Problem: Activity: Goal: Risk for activity intolerance will decrease Outcome: Not Progressing

## 2024-02-28 NOTE — Progress Notes (Signed)
 Physical Therapy Treatment Patient Details Name: [name] MRN: [id number] DOB: [birth year] Today's Date: 02/28/2024   History of Present Illness [name] is a 83 y.o. female with medical history significant for Hypothyroidism, rheumatoid arthritis on chronic prednisone  and Renflexis  infusions, followed by rheumatology, osteoarthritis, PCP documentation of several falls requiring ED visits, and who currently uses a walker, who is being admitted for rhabdomyolysis after she was found down by a neighbor. Found to be COVID+ on 6/30.    PT Comments  Pt resting in bed upon PT arrival; pt agreeable to therapy.  Pt oriented to name and DOB only (pt reported she was at [employer] when given multiple choice and also stated it was 1942).  During session pt was SBA with bed mobility; min assist progressing to CGA with transfers with RW use; and CGA to min assist ambulating 80 feet with RW use (pt requiring assist for walker management in room navigating around obstacles).  Will continue to focus on strengthening, balance, and progressive functional mobility during hospitalization.    If plan is discharge home, recommend the following: Assistance with cooking/housework;A little help with bathing/dressing/bathroom;A little help with walking and/or transfers;Supervision due to cognitive status;Assist for transportation;Help with stairs or ramp for entrance   Can travel by private vehicle     Yes  Equipment Recommendations  Rolling walker (2 wheels)    Recommendations for Other Services       Precautions / Restrictions Precautions Precautions: Fall Recall of Precautions/Restrictions: Impaired Restrictions Weight Bearing Restrictions Per Provider Order: No     Mobility  Bed Mobility Overal bed mobility: Needs Assistance Bed Mobility: Supine to Sit, Sit to Supine     Supine to sit: Supervision, HOB elevated Sit to supine: Supervision, HOB elevated   General bed mobility comments: mild  increased effort to perform on own    Transfers Overall transfer level: Needs assistance Equipment used: Rolling walker (2 wheels) Transfers: Sit to/from Stand Sit to Stand: Min assist           General transfer comment: assist to steady when standing; vc's for safety    Ambulation/Gait Ambulation/Gait assistance: Contact guard assist, Min assist Gait Distance (Feet): 80 Feet Assistive device: Rolling walker (2 wheels) Gait Pattern/deviations: Narrow base of support, Decreased step length - right, Decreased step length - left, Decreased stride length Gait velocity: decreased     General Gait Details: pt requiring assist with walker management in tight spaces in room; vc's for safety   Stairs             Wheelchair Mobility     Tilt Bed    Modified Rankin (Stroke Patients Only)       Balance Overall balance assessment: Needs assistance Sitting-balance support: No upper extremity supported, Feet supported Sitting balance-Leahy Scale: Good Sitting balance - Comments: steady reaching within BOS   Standing balance support: Bilateral upper extremity supported, During functional activity, Reliant on assistive device for balance Standing balance-Leahy Scale: Fair Standing balance comment: CGA in general but min assist for walker management in small/tight spaces                            Communication Communication Communication: No apparent difficulties  Cognition Arousal: Alert Behavior During Therapy: WFL for tasks assessed/performed   PT - Cognitive impairments: No family/caregiver present to determine baseline   Orientation impairments: Place, Time, Situation  PT - Cognition Comments: Pt oriented to name and DOB only.  Pt reported she was in [employer] when given multiple choice (hospital vs library vs school) and that it was [birth year]. Following commands: Intact Following commands impaired: Follows one step commands  with increased time    Cueing Cueing Techniques: Verbal cues, Gestural cues  Exercises General Exercises - Lower Extremity Long Arc Quad: AROM, Strengthening, Both, 10 reps, Seated Hip Flexion/Marching: AROM, Strengthening, Both, 10 reps, Seated Other Exercises Other Exercises: x5 sit to stands from bed up to RW (progressing from min assist to CGA)    General Comments  Nursing cleared pt for participation in physical therapy.  Pt agreeable to PT session.      Pertinent Vitals/Pain Pain Assessment Pain Assessment: Faces Faces Pain Scale: No hurt (0/10 at rest; 4/10 with activity) Pain Location: B feet Pain Descriptors / Indicators: Discomfort, Sore Pain Intervention(s): Limited activity within patient's tolerance, Monitored during session, Repositioned, Other (comment) (RN notified) HR 71-92 bpm and SpO2 sats 96% or greater on room air during session.    Home Living                          Prior Function            PT Goals (current goals can now be found in the care plan section) Acute Rehab PT Goals Patient Stated Goal: walk more PT Goal Formulation: With patient Time For Goal Achievement: 03/11/24 Potential to Achieve Goals: Good Progress towards PT goals: Progressing toward goals    Frequency    Min 2X/week      PT Plan      Co-evaluation              AM-PAC PT 6 Clicks Mobility   Outcome Measure  Help needed turning from your back to your side while in a flat bed without using bedrails?: None Help needed moving from lying on your back to sitting on the side of a flat bed without using bedrails?: A Little Help needed moving to and from a bed to a chair (including a wheelchair)?: A Little Help needed standing up from a chair using your arms (e.g., wheelchair or bedside chair)?: A Little Help needed to walk in hospital room?: A Little Help needed climbing 3-5 steps with a railing? : A Lot 6 Click Score: 18    End of Session Equipment  Utilized During Treatment: Gait belt Activity Tolerance: Patient tolerated treatment well Patient left: in bed;with call bell/phone within reach;with bed alarm set;Other (comment) (Fall mats in place on L and R side of bed) Nurse Communication: Mobility status;Precautions;Other (comment) (Pt's c/o pain in feet when walking) PT Visit Diagnosis: Muscle weakness (generalized) (M62.81);Difficulty in walking, not elsewhere classified (R26.2);Repeated falls (R29.6);Unsteadiness on feet (R26.81)     Time: 8359-8345 PT Time Calculation (min) (ACUTE ONLY): 14 min  Charges:    $Therapeutic Activity: 8-22 mins PT General Charges $$ ACUTE PT VISIT: 1 Visit                     Damien Caulk, PT 02/28/24, 5:47 PM

## 2024-02-28 NOTE — Progress Notes (Signed)
 Mobility Specialist - Progress Note   02/28/24 1006  Mobility  Activity Ambulated with assistance in hallway;Transferred from bed to chair  Level of Assistance Contact guard assist, steadying assist  Assistive Device Front wheel walker  Distance Ambulated (ft) 6 ft  Activity Response Tolerated well  Mobility visit 1 Mobility  Mobility Specialist Start Time (ACUTE ONLY) [id number]  Mobility Specialist Stop Time (ACUTE ONLY) 0951  Mobility Specialist Time Calculation (min) (ACUTE ONLY) 14 min   Pt supine upon entry, utilizing RA. Pt agreeable to OOB activity this date. Pt expressed feeling well, denies pain. Pt completed bed mob ModA to bring BLE EOB, MinA to bring trunk from sup to sit-- able to self support trunk once seated EOB. Pt STS to RW MinA-CGA, amb ~ 4 ft before needing to rest d/t bilateral foot pain (bottoms of feet)--- Pt expressed foot pain is new, RN notified. Pt sat for ~2 mins before transferring to the recliner CGA. Pt left seated in the recliner with alarm set and needs within reach.  America Silvan Mobility Specialist 02/28/24 10:12 AM

## 2024-02-28 NOTE — Progress Notes (Addendum)
 Progress Note   Patient: [name] FMW:[id number] DOB: 07-18-1941 DOA: 02/07/2024     20 DOS: the patient was seen and examined on 02/28/2024   Brief hospital course: [name] is a 83 y.o. female with medical history significant for Hypothyroidism, rheumatoid arthritis on chronic prednisone  and Renflexis  infusions, followed by rheumatology. They presented for rhabdomyolysis after she was found down by a neighbor > 24 hours when she fell and could not get up. ED course: normal vitals.  Labs showed normal CBC, Creatinine of 1.85 up from baseline of 1.1 to with bicarb of 19.  Mild LFT elevation with AST 43 and total bilirubin 2.7.  Troponin 17.  Urinalysis with many bacteria and small leuks.  EKG showed sinus arrhythmia at 88 CT head and C-spine without acute injury.  Chest x-ray nonacute showing multiple degenerative changes x-ray of the knee showing moderate right knee effusion among other findings-please see report. Patient treated with 2 L NS bolus and pain medicine.     Completed antibiotic for UTI.  IVF given and resolved rhabdo. Rehab was recommended by PT/OT but insurance company denied. TOC consulted for avenues for safe dispo as she lives alone and felt unsafe to be home alone. APS contacted and started case.    6/30 she was screened positive for COVID. She is currently asymptomatic and feels at baseline. She is ready for dc when safe dispo arranged.    I assumed care on 02/26/24. Patient remains medically stable for discharge pending safe disposition plan.   Assessment and Plan:  * COVID-19 virus infection Supportive care.  Diagnosed on 6/30. Now off isolation precautions.  Multiple falls Lives by herself.  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  TOC has been awaiting callback from DSS.  Rhabdomyolysis CK peaked at 1195.  This problem has resolved.  AKI (acute kidney injury) (HCC) Creatinine improved from 1.85 down  to 0.8  Acute cystitis without hematuria E. coli and Klebsiella growing out of urine culture.  Completed antibiotic course.  Rheumatoid arthritis involving multiple sites [facility]) On chronic immunosuppressive therapy Patient takes methotrexate  and folic acid .  Patient placed on prednisone  5 mg daily.  Acquired hypothyroidism Continue levothyroxine  at slightly increased dose with TSH being slightly elevated.  Constipation MiraLAX  ordered  Protein-calorie malnutrition, severe Continue supplements  Pressure injury of skin Present on admission.  Wounds restaged by wound care nurse.  They have progressed..  1.  Right buttock/ischium stage III.  2.  Left buttock/ischium now unstageable.  Cleanse bilateral buttock wounds with Vashe wound cleanser [name] [phone]), allowed to air dry.  Apply one quarter thick layer of Santyl  to wound beds daily using a Q-tip applicator to fill on the depth of the left buttock wound with normal saline gauze, top right buttock wound with normal saline gauze.  Top dry gauze and secure with silicone foam or ABD pad.  Memory loss Definitely concerned about her memory and living by herself.  Seen by psychiatry and deemed capable of making medical decisions currently.   TOC is engaging DSS -- see their notes. Patient's orientation waxes and wanes, but I agree with psychiatry's assessment from 6/20 that pt has capacity for making decisions.      Subjective: Pt was awake sitting up in bed when seen on rounds this AM. No acute complaints.  She continues to ask about going home.     Physical Exam: Vitals:   02/27/24 1558 02/27/24 2005 02/28/24  0503 02/28/24 0825  BP: (!) 114/57 (!) 105/51 122/62 (!) 113/51  Pulse: 79 76 65 69  Resp: 16   16  Temp: 99 F (37.2 C) 98.8 F (37.1 C) 98.3 F (36.8 C) 98.2 F (36.8 C)  TempSrc: Oral   Oral  SpO2: 97% 94% 98% 97%  Weight:      Height:       General exam: awake, alert, no acute distress, frail HEENT: moist mucus  membranes, hearing grossly normal  Respiratory system: on room air, normal respiratory effort. Cardiovascular system: RRR, no peripheral edema Gastrointestinal system: soft, NT, ND, +bowel sounds Central nervous system: A&O x 3. no gross focal neurologic deficits, normal speech Extremities: moves all, no edema, normal tone Skin: dry, intact, normal temperature Psychiatry: normal mood, congruent affect   Data Reviewed:  Notable labs from 7/9 -- Normal BMP except BUN 31 and Ca 8.8 CBC with WBC 3.4, hbg stable 11.6  Family Communication: None. No new medical updates to provide at this time.   Disposition: Status is: Inpatient Remains inpatient appropriate because: awaiting SNF placement.   DSS now involved due to concerns of home safety due to hoarding and roaches.   Planned Discharge Destination: Skilled nursing facility    Time spent: 35 minutes  Author: Burnard Megan Cunning, DO 02/28/2024 2:03 PM  For on call review [email or web address].

## 2024-02-29 DIAGNOSIS — U071 COVID-19: Secondary | ICD-10-CM | POA: Diagnosis not present

## 2024-02-29 NOTE — Plan of Care (Signed)
  Problem: Education: Goal: Knowledge of General Education information will improve Description: Including pain rating scale, medication(s)/side effects and non-pharmacologic comfort measures Outcome: Progressing   Problem: Health Behavior/Discharge Planning: Goal: Ability to manage health-related needs will improve Outcome: Progressing   Problem: Clinical Measurements: Goal: Ability to maintain clinical measurements within normal limits will improve Outcome: Progressing Goal: Will remain free from infection Outcome: Progressing Goal: Diagnostic test results will improve Outcome: Progressing Goal: Respiratory complications will improve Outcome: Progressing Goal: Cardiovascular complication will be avoided Outcome: Progressing   Problem: Activity: Goal: Risk for activity intolerance will decrease Outcome: Progressing   Problem: Nutrition: Goal: Adequate nutrition will be maintained Outcome: Progressing   Problem: Coping: Goal: Level of anxiety will decrease Outcome: Progressing   Problem: Elimination: Goal: Will not experience complications related to bowel motility Outcome: Progressing Goal: Will not experience complications related to urinary retention Outcome: Progressing   Problem: Pain Managment: Goal: General experience of comfort will improve and/or be controlled Outcome: Progressing   Problem: Safety: Goal: Ability to remain free from injury will improve Outcome: Progressing   Problem: Skin Integrity: Goal: Risk for impaired skin integrity will decrease Outcome: Progressing   Problem: Education: Goal: Knowledge of risk factors and measures for prevention of condition will improve Outcome: Completed/Met   Problem: Coping: Goal: Psychosocial and spiritual needs will be supported Outcome: Completed/Met   Problem: Respiratory: Goal: Will maintain a patent airway Outcome: Completed/Met Goal: Complications related to the disease process, condition or  treatment will be avoided or minimized Outcome: Completed/Met

## 2024-02-29 NOTE — Progress Notes (Addendum)
 Progress Note   Patient: [name] FMW:[id number] DOB: [birth year] DOA: 02/07/2024     21 DOS: the patient was seen and examined on 02/29/2024   Brief hospital course: [name] is a 83 y.o. female with medical history significant for Hypothyroidism, rheumatoid arthritis on chronic prednisone  and Renflexis  infusions, followed by rheumatology. They presented for rhabdomyolysis after she was found down by a neighbor > 24 hours when she fell and could not get up. ED course: normal vitals.  Labs showed normal CBC, Creatinine of 1.85 up from baseline of 1.1 to with bicarb of 19.  Mild LFT elevation with AST 43 and total bilirubin 2.7.  Troponin 17.  Urinalysis with many bacteria and small leuks.  EKG showed sinus arrhythmia at 88 CT head and C-spine without acute injury.  Chest x-ray nonacute showing multiple degenerative changes x-ray of the knee showing moderate right knee effusion among other findings-please see report. Patient treated with 2 L NS bolus and pain medicine.     Completed antibiotic for UTI.  IVF given and resolved rhabdo. Rehab was recommended by PT/OT but insurance company denied. TOC consulted for avenues for safe dispo as she lives alone and felt unsafe to be home alone. APS contacted and started case.    6/30 she was screened positive for COVID. She is currently asymptomatic and feels at baseline. She is ready for dc when safe dispo arranged.    I assumed care on 02/26/24. Patient remains medically stable for discharge pending safe disposition plan.   Assessment and Plan:  * COVID-19 virus infection Supportive care.  Diagnosed on 6/30. Now off isolation precautions.  Difficulty voiding - reported 7/11.   UA borderline for questionable UTI with pyuria, rare bacteria. Pt today denies dysuria or issues voiding --Follow urine culture --Defer antibiotics for now  Multiple falls Lives by herself.  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  TOC has been awaiting callback from DSS.  Rhabdomyolysis CK peaked at 1195.  This problem has resolved.  AKI (acute kidney injury) (HCC) Creatinine improved from 1.85 down to 0.8  Acute cystitis without hematuria E. coli and Klebsiella growing out of urine culture.  Completed antibiotic course.  Rheumatoid arthritis involving multiple sites [facility]) On chronic immunosuppressive therapy Patient takes methotrexate  and folic acid .  Patient placed on prednisone  5 mg daily.  Acquired hypothyroidism Continue levothyroxine  at slightly increased dose with TSH being slightly elevated.  Constipation MiraLAX  ordered  Protein-calorie malnutrition, severe Continue supplements  Pressure injury of skin Present on admission.  Wounds restaged by wound care nurse.  They have progressed..  1.  Right buttock/ischium stage III.  2.  Left buttock/ischium now unstageable.  Cleanse bilateral buttock wounds with Vashe wound cleanser [name] [phone]), allowed to air dry.  Apply one quarter thick layer of Santyl  to wound beds daily using a Q-tip applicator to fill on the depth of the left buttock wound with normal saline gauze, top right buttock wound with normal saline gauze.  Top dry gauze and secure with silicone foam or ABD pad.  Memory loss Definitely concerned about her memory and living by herself.  Seen by psychiatry and deemed capable of making medical decisions currently.   TOC is engaging DSS -- see their notes. Patient's orientation waxes and wanes, but I agree with psychiatry's assessment from 6/20 that pt has capacity for making decisions.      Subjective: Pt was awake sitting up in bed  when seen on rounds this AM. No acute complaints.  She continues to ask about going home.     Physical Exam: Vitals:   02/28/24 1535 02/28/24 2033 02/29/24 0426 02/29/24 0747  BP: (!) 108/53 112/60 118/65 (!) 122/51  Pulse: 76 76 67 79  Resp: 17 18 18 18   Temp: 98.3 F (36.8 C)  98.5 F (36.9 C) 97.9 F (36.6 C) 97.6 F (36.4 C)  TempSrc:    Oral  SpO2: 97% 96% 98% 99%  Weight:      Height:       General exam: awake, alert, no acute distress, frail HEENT: moist mucus membranes, hearing grossly normal  Respiratory system: on room air, normal respiratory effort. CTAB no wheezes or rhonchi Cardiovascular system: RRR, no peripheral edema, normal S1/S2 Gastrointestinal system: soft, NT, ND, +bowel sounds Central nervous system: A&O x 3. no gross focal neurologic deficits, normal speech Extremities: moves all, no edema, normal tone Skin: dry, intact, normal temperature Psychiatry: normal mood, congruent affect   Data Reviewed:  Notable labs from 7/9 -- Normal BMP except BUN 31 and Ca 8.8 CBC with WBC 3.4, hbg stable 11.6  Family Communication: None. No new medical updates to provide at this time.   Disposition: Status is: Inpatient Remains inpatient appropriate because: awaiting SNF placement.     Planned Discharge Destination: Skilled nursing facility    Time spent: 35 minutes  Author: Burnard DELENA Cunning, DO 02/29/2024 2:29 PM  For on call review [email or web address].

## 2024-03-01 DIAGNOSIS — U071 COVID-19: Secondary | ICD-10-CM | POA: Diagnosis not present

## 2024-03-01 LAB — URINE CULTURE: Culture: <10000 — AB

## 2024-03-01 NOTE — Progress Notes (Signed)
 Progress Note   Patient: [name] FMW:[id number] DOB: [birth year] DOA: 02/07/2024     22 DOS: the patient was seen and examined on 03/01/2024   Brief hospital course: [name] is a 83 y.o. female with medical history significant for Hypothyroidism, rheumatoid arthritis on chronic prednisone  and Renflexis  infusions, followed by rheumatology. They presented for rhabdomyolysis after she was found down by a neighbor > 24 hours when she fell and could not get up. ED course: normal vitals.  Labs showed normal CBC, Creatinine of 1.85 up from baseline of 1.1 to with bicarb of 19.  Mild LFT elevation with AST 43 and total bilirubin 2.7.  Troponin 17.  Urinalysis with many bacteria and small leuks.  EKG showed sinus arrhythmia at 88 CT head and C-spine without acute injury.  Chest x-ray nonacute showing multiple degenerative changes x-ray of the knee showing moderate right knee effusion among other findings-please see report. Patient treated with 2 L NS bolus and pain medicine.     Completed antibiotic for UTI.  IVF given and resolved rhabdo. Rehab was recommended by PT/OT but insurance company denied. TOC consulted for avenues for safe dispo as she lives alone and felt unsafe to be home alone. APS contacted and started case.    6/30 she was screened positive for COVID. She is currently asymptomatic and feels at baseline. She is ready for dc when safe dispo arranged.    I assumed care on 02/26/24. Patient remains medically stable for discharge pending safe disposition plan.   Assessment and Plan:  * COVID-19 virus infection Supportive care.  Diagnosed on 6/30. Now off isolation precautions.  Difficulty voiding - reported 7/11.   UA borderline for questionable UTI with pyuria, rare bacteria. Urine culture insignificant growth Defer antibiotics   Multiple falls Lives by herself.  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  TOC has been  awaiting callback from DSS.  Rhabdomyolysis CK peaked at 1195.  This problem has resolved.  AKI (acute kidney injury) (HCC) Creatinine improved from 1.85 down to 0.8  Acute cystitis without hematuria E. coli and Klebsiella growing out of urine culture.  Completed antibiotic course.  Rheumatoid arthritis involving multiple sites [facility]) On chronic immunosuppressive therapy Patient takes methotrexate  and folic acid .  Patient placed on prednisone  5 mg daily.  Acquired hypothyroidism Continue levothyroxine  at slightly increased dose with TSH being slightly elevated.  Constipation MiraLAX  ordered  Protein-calorie malnutrition, severe Continue supplements  Pressure injury of skin Present on admission.  Wounds restaged by wound care nurse.  They have progressed..  1.  Right buttock/ischium stage III.  2.  Left buttock/ischium now unstageable.  Cleanse bilateral buttock wounds with Vashe wound cleanser [name] [id number]), allowed to air dry.  Apply one quarter thick layer of Santyl  to wound beds daily using a Q-tip applicator to fill on the depth of the left buttock wound with normal saline gauze, top right buttock wound with normal saline gauze.  Top dry gauze and secure with silicone foam or ABD pad.  Memory loss Definitely concerned about her memory and living by herself.  Seen by psychiatry and deemed capable of making medical decisions currently.   TOC is engaging DSS -- see their notes. Patient's orientation waxes and wanes, but I agree with psychiatry's assessment from 6/20 that pt has capacity for making decisions.      Subjective: Pt was awake eating breakfast when seen this AM.  She reports feeling well  and asks about when she will get to go to rehab.     Physical Exam: Vitals:   02/29/24 1619 02/29/24 2155 03/01/24 0445 03/01/24 0829  BP: (!) 103/51 107/60 (!) 128/58 (!) 118/58  Pulse: 94 79 71 70  Resp: 18     Temp: 98.8 F (37.1 C) 98.5 F (36.9 C) (!) 97.3 F (36.3  C)   TempSrc: Oral Oral Oral   SpO2: 99% 97% 96% 95%  Weight:   39.4 kg   Height:       General exam: awake, alert, no acute distress, frail, underweight HEENT: moist mucus membranes, hearing grossly normal  Respiratory system: CTAB, no wheezes, rales or rhonchi, normal respiratory effort. Cardiovascular system: normal S1/S2, RRR Gastrointestinal system: soft, NT, ND Central nervous system: A&O x 3. no gross focal neurologic deficits, normal speech Extremities: severe arthritis deformities of both hands, no edema, normal tone Psychiatry: normal mood, congruent affect, judgement and insight appear normal    Data Reviewed:  Notable labs from 7/9 -- Normal BMP except BUN 31 and Ca 8.8 CBC with WBC 3.4, hbg stable 11.6  Family Communication: None. No new medical updates to provide at this time.   Disposition: Status is: Inpatient Remains inpatient appropriate because: awaiting SNF placement.     Planned Discharge Destination: Skilled nursing facility    Time spent: 25 minutes  Author: Burnard DELENA Cunning, DO 03/01/2024 12:36 PM  For on call review [email or web address].

## 2024-03-01 NOTE — Plan of Care (Signed)

## 2024-03-01 NOTE — Plan of Care (Signed)

## 2024-03-02 DIAGNOSIS — U071 COVID-19: Secondary | ICD-10-CM | POA: Diagnosis not present

## 2024-03-02 NOTE — Progress Notes (Signed)
 Physical Therapy Treatment Patient Details Name: [name] MRN: [id number] DOB: Jul 16, 1941 Today's Date: 03/02/2024   History of Present Illness [name] is a 83 y.o. female with medical history significant for Hypothyroidism, rheumatoid arthritis on chronic prednisone  and Renflexis  infusions, followed by rheumatology, osteoarthritis, PCP documentation of several falls requiring ED visits, and who currently uses a walker, who is being admitted for rhabdomyolysis after she was found down by a neighbor. Found to be COVID+ on 6/30.    PT Comments  Pt received in bed, agreed to PT session, Supervision for bed mobility with reliance on bed rail to come to sitting EOB. No dizziness in sitting. Sit<>stand with CGA for safety. Gait training in hallway with support of RW and CGA, occasional assist to maneuver around objects. No LOB or significant fatigue. PT declined sitting up in chair. Educated on side lying position to off load skin breakdown with fair understanding, Pt perseverating on her missing dentures - nursing notified.    If plan is discharge home, recommend the following: Assistance with cooking/housework;A little help with bathing/dressing/bathroom;A little help with walking and/or transfers;Supervision due to cognitive status;Assist for transportation;Help with stairs or ramp for entrance   Can travel by private vehicle     Yes  Equipment Recommendations  Rolling walker (2 wheels)    Recommendations for Other Services       Precautions / Restrictions Precautions Precautions: Fall Recall of Precautions/Restrictions: Impaired Restrictions Weight Bearing Restrictions Per Provider Order: No     Mobility  Bed Mobility Overal bed mobility: Needs Assistance Bed Mobility: Supine to Sit, Sit to Supine Rolling: Used rails, Supervision   Supine to sit: Supervision, HOB elevated Sit to supine: Supervision, HOB elevated   General bed mobility comments: mild increased effort  to perform on own    Transfers Overall transfer level: Needs assistance Equipment used: Rolling walker (2 wheels) Transfers: Sit to/from Stand Sit to Stand: Min assist           General transfer comment: assist to power up to stand, vc's for safety    Ambulation/Gait Ambulation/Gait assistance: Contact guard assist, Min assist Gait Distance (Feet): 100 Feet Assistive device: Rolling walker (2 wheels) Gait Pattern/deviations: Narrow base of support, Decreased step length - right, Decreased step length - left, Decreased stride length Gait velocity: decreased     General Gait Details: pt requiring assist with walker management in tight spaces in room; vc's for safety   Stairs             Wheelchair Mobility     Tilt Bed    Modified Rankin (Stroke Patients Only)       Balance Overall balance assessment: Needs assistance Sitting-balance support: No upper extremity supported, Feet supported Sitting balance-Leahy Scale: Good Sitting balance - Comments: steady reaching within BOS   Standing balance support: Bilateral upper extremity supported, During functional activity, Reliant on assistive device for balance Standing balance-Leahy Scale: Fair Standing balance comment: CGA in general but min assist for walker management in small/tight spaces                            Communication Communication Communication: No apparent difficulties  Cognition Arousal: Alert Behavior During Therapy: WFL for tasks assessed/performed   PT - Cognitive impairments: No family/caregiver present to determine baseline                         Following  commands: Intact Following commands impaired: Follows one step commands with increased time    Cueing Cueing Techniques: Verbal cues, Gestural cues  Exercises      General Comments General comments (skin integrity, edema, etc.):  (Stage III and unstageable bilateral buttocks wounds covered with mepilex)       Pertinent Vitals/Pain Pain Assessment Pain Assessment: No/denies pain    Home Living                          Prior Function            PT Goals (current goals can now be found in the care plan section) Acute Rehab PT Goals Patient Stated Goal: walk more Progress towards PT goals: Progressing toward goals    Frequency    Min 2X/week      PT Plan      Co-evaluation              AM-PAC PT 6 Clicks Mobility   Outcome Measure  Help needed turning from your back to your side while in a flat bed without using bedrails?: None Help needed moving from lying on your back to sitting on the side of a flat bed without using bedrails?: A Little Help needed moving to and from a bed to a chair (including a wheelchair)?: A Little Help needed standing up from a chair using your arms (e.g., wheelchair or bedside chair)?: A Little Help needed to walk in hospital room?: A Little Help needed climbing 3-5 steps with a railing? : A Lot 6 Click Score: 18    End of Session Equipment Utilized During Treatment: Gait belt Activity Tolerance: Patient tolerated treatment well Patient left: in bed;with call bell/phone within reach;with bed alarm set;Other (comment) Nurse Communication: Mobility status;Precautions;Other (comment) PT Visit Diagnosis: Muscle weakness (generalized) (M62.81);Difficulty in walking, not elsewhere classified (R26.2);Repeated falls (R29.6);Unsteadiness on feet (R26.81)     Time: 1600-1620 PT Time Calculation (min) (ACUTE ONLY): 20 min  Charges:    $Therapeutic Activity: 8-22 mins PT General Charges $$ ACUTE PT VISIT: 1 Visit                    Darice Bohr, PTA  Darice JAYSON Bohr 03/02/2024, 4:47 PM

## 2024-03-02 NOTE — Progress Notes (Signed)
 Occupational Therapy Treatment Patient Details Name: [name] MRN: [id number] DOB: 07-31-1941 Today's Date: 03/02/2024   History of present illness [name] is a 83 y.o. female with medical history significant for Hypothyroidism, rheumatoid arthritis on chronic prednisone  and Renflexis  infusions, followed by rheumatology, osteoarthritis, PCP documentation of several falls requiring ED visits, and who currently uses a walker, who is being admitted for rhabdomyolysis after she was found down by a neighbor. Found to be COVID+ on 6/30.   OT comments  Ms. Provencal was seen for OT treatment on this date. Upon arrival to room pt semi-fowlers in bed with RN in room assisting with breakfast tray. Pt agreeable to OT Tx session. OT facilitated ADL management as described below. See ADL section for additional details regarding occupational performance. Pt continues to be functionally limited by decreased cognition, decreased activity tolerance, and generalized weakness. Pt return verbalizes understanding of education provided t/o session. Pt is progressing toward OT goals and continues to benefit from skilled OT services to maximize return to PLOF and minimize risk of future falls, injury, caregiver burden, and readmission. Will continue to follow POC as written. Discharge recommendation remains appropriate.        If plan is discharge home, recommend the following:  A little help with walking and/or transfers;A little help with bathing/dressing/bathroom;Assistance with cooking/housework;Direct supervision/assist for medications management;Direct supervision/assist for financial management;Assist for transportation;Supervision due to cognitive status;Help with stairs or ramp for entrance   Equipment Recommendations  Other (comment) (defer)    Recommendations for Other Services      Precautions / Restrictions Precautions Precautions: Fall Recall of Precautions/Restrictions:  Impaired Restrictions Weight Bearing Restrictions Per Provider Order: No       Mobility Bed Mobility Overal bed mobility: Needs Assistance Bed Mobility: Supine to Sit, Sit to Supine Rolling: Used rails, Supervision         General bed mobility comments: mild increased effort to perform on own    Transfers Overall transfer level: Needs assistance Equipment used: Rolling walker (2 wheels) Transfers: Sit to/from Stand Sit to Stand: Min assist     Step pivot transfers: Contact guard assist     General transfer comment: assist to power up to stand, vc's for safety     Balance Overall balance assessment: Needs assistance Sitting-balance support: No upper extremity supported, Feet supported Sitting balance-Leahy Scale: Good Sitting balance - Comments: steady reaching within BOS   Standing balance support: Bilateral upper extremity supported, During functional activity, Reliant on assistive device for balance Standing balance-Leahy Scale: Fair                             ADL either performed or assessed with clinical judgement   ADL Overall ADL's : Needs assistance/impaired Eating/Feeding: Sitting;Set up Eating/Feeding Details (indicate cue type and reason): assist for opening meal tray items and occasional loading of spoon/fork 2/2 DYS 3 diet texture. Pt is able to eat soft solids (grits) without assist and can consistently bring spoon, fork, cup up to her mouth. Grooming: Sitting;Wash/dry hands;Set up;Supervision/safety                               Functional mobility during ADLs: Contact guard assist;Rolling walker (2 wheels) General ADL Comments: Cueing for safe transfer technique, falls prevention, and safe use of RW t/o session.    Extremity/Trunk Assessment  Vision Patient Visual Report: No change from baseline     Perception     Praxis     Communication Communication Communication: No apparent difficulties    Cognition Arousal: Alert Behavior During Therapy: WFL for tasks assessed/performed Cognition: History of cognitive impairments     Awareness: Online awareness impaired, Intellectual awareness intact Memory impairment (select all impairments): Short-term memory, Working memory, Non-declarative long-term memory Attention impairment (select first level of impairment): Selective attention Executive functioning impairment (select all impairments): Reasoning, Problem solving OT - Cognition Comments: continues to have difficulty with task sequencing and initiation. Unsure of functional baseline at this time.                 Following commands: Intact Following commands impaired: Follows one step commands with increased time      Cueing   Cueing Techniques: Verbal cues, Gestural cues  Exercises Other Exercises Other Exercises: OT facilitated ADL management with education and assist as described above.    Shoulder Instructions       General Comments      Pertinent Vitals/ Pain       Pain Assessment Pain Assessment: Faces Faces Pain Scale: Hurts a little bit Pain Location: B feet when standing. Pain Descriptors / Indicators: Discomfort, Sore Pain Intervention(s): Limited activity within patient's tolerance, Monitored during session, Repositioned (RN in during session and aware)  Home Living                                          Prior Functioning/Environment              Frequency  Min 2X/week        Progress Toward Goals  OT Goals(current goals can now be found in the care plan section)  Progress towards OT goals: Progressing toward goals  Acute Rehab OT Goals Patient Stated Goal: to go home OT Goal Formulation: With patient Time For Goal Achievement: 03/16/24 Potential to Achieve Goals: Good  Plan      Co-evaluation                 AM-PAC OT 6 Clicks Daily Activity     Outcome Measure   Help from another person  eating meals?: A Little Help from another person taking care of personal grooming?: A Little Help from another person toileting, which includes using toliet, bedpan, or urinal?: A Lot Help from another person bathing (including washing, rinsing, drying)?: A Lot Help from another person to put on and taking off regular upper body clothing?: A Lot Help from another person to put on and taking off regular lower body clothing?: A Lot 6 Click Score: 14    End of Session Equipment Utilized During Treatment: Rolling walker (2 wheels)  OT Visit Diagnosis: Unsteadiness on feet (R26.81);Other abnormalities of gait and mobility (R26.89);Repeated falls (R29.6);Muscle weakness (generalized) (M62.81)   Activity Tolerance Patient tolerated treatment well   Patient Left with call bell/phone within reach;with chair alarm set;in chair   Nurse Communication Mobility status        Time: 9052-8988 OT Time Calculation (min): 24 min  Charges: OT General Charges $OT Visit: 1 Visit OT Treatments $Self Care/Home Management : 23-37 mins  Jhonny Pelton, M.S., OTR/L 03/02/24, 12:51 PM

## 2024-03-02 NOTE — Progress Notes (Signed)
 Progress Note   Patient: [name] DOB: Jan 26, 1941 DOA: 02/07/2024     23 DOS: the patient was seen and examined on 03/02/2024   Brief hospital course: [name] is a 83 y.o. female with medical history significant for Hypothyroidism, rheumatoid arthritis on chronic prednisone  and Renflexis  infusions, followed by rheumatology. They presented for rhabdomyolysis after she was found down by a neighbor > 24 hours when she fell and could not get up. ED course: normal vitals.  Labs showed normal CBC, Creatinine of 1.85 up from baseline of 1.1 to with bicarb of 19.  Mild LFT elevation with AST 43 and total bilirubin 2.7.  Troponin 17.  Urinalysis with many bacteria and small leuks.  EKG showed sinus arrhythmia at 88 CT head and C-spine without acute injury.  Chest x-ray nonacute showing multiple degenerative changes x-ray of the knee showing moderate right knee effusion among other findings-please see report. Patient treated with 2 L NS bolus and pain medicine.     Completed antibiotic for UTI.  IVF given and resolved rhabdo. Rehab was recommended by PT/OT but insurance company denied. TOC consulted for avenues for safe dispo as she lives alone and felt unsafe to be home alone. APS contacted and started case.    6/30 she was screened positive for COVID. She is currently asymptomatic and feels at baseline. She is ready for dc when safe dispo arranged.    I assumed care on 02/26/24. Patient remains medically stable for discharge pending safe disposition plan.   Assessment and Plan:  * COVID-19 virus infection Supportive care.  Diagnosed on 6/30. Now off isolation precautions.  Difficulty voiding - reported 7/11.   UA borderline for questionable UTI with pyuria, rare bacteria. Urine culture insignificant growth Defer antibiotics   Multiple falls Lives by herself.  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  TOC following and  DSS has been engaged.  See TOC notes. SNF placement is pending.  Rhabdomyolysis CK peaked at 1195.  This problem has resolved.  AKI (acute kidney injury) (HCC) Creatinine improved from 1.85 down to 0.8  Acute cystitis without hematuria E. coli and Klebsiella growing out of urine culture.  Completed antibiotic course.  Rheumatoid arthritis involving multiple sites [facility]) On chronic immunosuppressive therapy Patient takes methotrexate  and folic acid .  Patient placed on prednisone  5 mg daily.  Acquired hypothyroidism Continue levothyroxine  at slightly increased dose with TSH being slightly elevated.  Constipation MiraLAX  ordered  Protein-calorie malnutrition, severe Continue supplements  Pressure injury of skin Present on admission.  Wounds restaged by wound care nurse.  They have progressed..  1.  Right buttock/ischium stage III.  2.  Left buttock/ischium now unstageable.  Cleanse bilateral buttock wounds with Vashe wound cleanser [name] [phone]), allowed to air dry.  Apply one quarter thick layer of Santyl  to wound beds daily using a Q-tip applicator to fill on the depth of the left buttock wound with normal saline gauze, top right buttock wound with normal saline gauze.  Top dry gauze and secure with silicone foam or ABD pad.  Memory loss Definitely concerned about her memory and living by herself.  Seen by psychiatry and deemed capable of making medical decisions currently.   TOC is engaging DSS -- see their notes. Patient's orientation waxes and wanes, but I agree with psychiatry's assessment from 6/20 that pt has capacity for making decisions.      Subjective: Pt was sleeping when seen this  AM, woke easily to voice.  She denies pain, feeling sick or other complaints.  Awaiting d/c to rehab.   Physical Exam: Vitals:   03/01/24 2125 03/02/24 0450 03/02/24 0510 03/02/24 0821  BP: (!) 106/58 (!) 115/59  (!) 118/48  Pulse: 81 73  80  Resp:   20 20  Temp: 98.3 F (36.8 C)  (!) 97.2 F (36.2 C)  97.6 F (36.4 C)  TempSrc: Oral     SpO2: 96% 95%  96%  Weight:      Height:       General exam: sleeping comfortably, wakes to voice, no acute distress, frail, underweight HEENT: moist mucus membranes, hearing grossly normal  Respiratory system: on room air, normal respiratory effort. Cardiovascular system: RRR, no  peripheral edema Gastrointestinal system: soft, NT, ND Central nervous system: A&O x 3. no gross focal neurologic deficits, normal speech Extremities: severe arthritis deformities of both hands, no edema, normal tone Psychiatry: normal mood, congruent affect    Data Reviewed:  No new labs today. Notable labs from 7/9 -- Normal BMP except BUN 31 and Ca 8.8 CBC with WBC 3.4, hbg stable 11.6  Family Communication: None. No new medical updates to provide at this time.   Disposition: Status is: Inpatient Remains inpatient appropriate because: awaiting SNF placement.     Planned Discharge Destination: Skilled nursing facility    Time spent: 25 minutes  Author: Burnard Megan Cunning, DO 03/02/2024 11:50 AM  For on call review [email or web address].

## 2024-03-02 NOTE — Plan of Care (Signed)
  Problem: Education: Goal: Knowledge of General Education information will improve Description: Including pain rating scale, medication(s)/side effects and non-pharmacologic comfort measures Outcome: Progressing   Problem: Clinical Measurements: Goal: Will remain free from infection Outcome: Progressing   Problem: Activity: Goal: Risk for activity intolerance will decrease Outcome: Progressing   Problem: Nutrition: Goal: Adequate nutrition will be maintained Outcome: Progressing   Problem: Coping: Goal: Level of anxiety will decrease Outcome: Progressing   Problem: Elimination: Goal: Will not experience complications related to bowel motility Outcome: Progressing Goal: Will not experience complications related to urinary retention Outcome: Progressing   Problem: Pain Managment: Goal: General experience of comfort will improve and/or be controlled Outcome: Progressing   Problem: Safety: Goal: Ability to remain free from injury will improve Outcome: Progressing   Problem: Skin Integrity: Goal: Risk for impaired skin integrity will decrease Outcome: Progressing

## 2024-03-03 ENCOUNTER — Ambulatory Visit: Admitting: Podiatry

## 2024-03-03 DIAGNOSIS — U071 COVID-19: Secondary | ICD-10-CM | POA: Diagnosis not present

## 2024-03-03 NOTE — Plan of Care (Signed)

## 2024-03-03 NOTE — Progress Notes (Signed)
 Nutrition Follow-up  DOCUMENTATION CODES:   Severe malnutrition in context of social or environmental circumstances, Underweight  INTERVENTION:   -Magic cup TID with meals, each supplement provides 290 kcal and 9 grams of protein  -Continue dysphagia 3 (mechanical soft) for ease of intake -Continue Ensure Plus High Protein po to BID, each supplement provides 350 kcal and 20 grams of protein  -Continue MVI with minerals daily -Continue 500 mg vitamin C  BID -Continue 220 mg zinc  sulfate daily x 14 days -Continue 10,000 units vitamin A  daily -RD will sign off secondary to medical stability; if further nutrition-related issues arise, please re-consult RD  NUTRITION DIAGNOSIS:   Severe Malnutrition related to social / environmental circumstances as evidenced by moderate fat depletion, severe fat depletion, moderate muscle depletion, severe muscle depletion.  Ongoing  GOAL:   Patient will meet greater than or equal to 90% of their needs  Progressing   MONITOR:   PO intake, Supplement acceptance  REASON FOR ASSESSMENT:   Consult Assessment of nutrition requirement/status  ASSESSMENT:   Pt with medical history significant for Hypothyroidism, rheumatoid arthritis on chronic prednisone  and Renflexis  infusions, followed by rheumatology, osteoarthritis, PCP documentation of several falls requiring ED visits, and who currently uses a walker, who is being admitted for rhabdomyolysis after she was found down by a neighbor.  6/30- COVID +   Pt sleeping soundly at time of visit. She did not respond to name being called.   Pt remains on a dysphagia 3 diet for ease of intake. Meal completion remains variable; PO 25-75%. Pt also drinking Ensure supplements.   Reviewed wt hx; pt has experienced a 5.7% wt loss over the past week, which is significant for time frame.   TOC working with DSS regarding safe disposition plan. Per MD, pt medically stable for discharge; awaiting safe discharge  plan.   Medications reviewed and include santyl , vitamin B-12, lovenox , ferrous sulfate , senokot, and vitamin A .   Labs reviewed.    Diet Order:   Diet Order             DIET DYS 3 Fluid consistency: Thin  Diet effective now                   EDUCATION NEEDS:   Education needs have been addressed  Skin:  Skin Assessment: Skin Integrity Issues: Skin Integrity Issues:: Stage II, Unstageable, Stage III DTI: - Stage II: coccyx Stage III: rt buttock/ ischium Unstageable: lt buttock ischium with black tan necrotic tissue  Last BM:  03/02/24 (type 6)  Height:   Ht Readings from Last 1 Encounters:  02/07/24 5' 6 (1.676 m)    Weight:   Wt Readings from Last 1 Encounters:  03/01/24 39.4 kg    Ideal Body Weight:  59.1 kg  BMI:  Body mass index is 14.02 kg/m.  Estimated Nutritional Needs:   Kcal:  1750-1950  Protein:  100-115 grams  Fluid:  1.7-1.9 L    Margery ORN, RD, LDN, CDCES Registered Dietitian III Certified Diabetes Care and Education Specialist If unable to reach this RD, please use RD Inpatient group chat on secure chat between hours of 8am-4 pm daily

## 2024-03-03 NOTE — Progress Notes (Addendum)
 Progress Note   Patient: [name] DOB: [birth year] DOA: 02/07/2024     24 DOS: the patient was seen and examined on 03/03/2024   Brief hospital course: [name] is a 83 y.o. female with medical history significant for Hypothyroidism, rheumatoid arthritis on chronic prednisone  and Renflexis  infusions, followed by rheumatology. They presented for rhabdomyolysis after she was found down by a neighbor > 24 hours when she fell and could not get up. ED course: normal vitals.  Labs showed normal CBC, Creatinine of 1.85 up from baseline of 1.1 to with bicarb of 19.  Mild LFT elevation with AST 43 and total bilirubin 2.7.  Troponin 17.  Urinalysis with many bacteria and small leuks.  EKG showed sinus arrhythmia at 88 CT head and C-spine without acute injury.  Chest x-ray nonacute showing multiple degenerative changes x-ray of the knee showing moderate right knee effusion among other findings-please see report. Patient treated with 2 L NS bolus and pain medicine.     Completed antibiotic for UTI.  IVF given and resolved rhabdo. Rehab was recommended by PT/OT but insurance company denied. TOC consulted for avenues for safe dispo as she lives alone and felt unsafe to be home alone. APS contacted and started case.    6/30 she was screened positive for COVID. She is currently asymptomatic and feels at baseline. She is ready for dc when safe dispo arranged.    I assumed care on 02/26/24. Pt is now off Covid isolation. Patient remains medically stable for discharge pending safe disposition plan. Patient is agreeable to go to SNF, was previously refusing.   Appears DSS pursuing guardianship.     Assessment and Plan:  * COVID-19 virus infection Supportive care.  Diagnosed on 6/30. Now off isolation precautions.  Difficulty voiding - reported 7/11.   UA borderline for questionable UTI with pyuria, rare bacteria. Urine culture insignificant growth Defer antibiotics    Multiple falls Lives by herself.  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  TOC following and DSS has been engaged.  See TOC notes. SNF placement is pending.  Rhabdomyolysis CK peaked at 1195.  This problem has resolved.  AKI (acute kidney injury) (HCC) Creatinine improved from 1.85 down to 0.8  Acute cystitis without hematuria E. coli and Klebsiella growing out of urine culture.  Completed antibiotic course.  Rheumatoid arthritis involving multiple sites [facility]) On chronic immunosuppressive therapy Patient takes methotrexate  and folic acid .  Patient placed on prednisone  5 mg daily.  Acquired hypothyroidism Continue levothyroxine  at slightly increased dose with TSH being slightly elevated.  Constipation MiraLAX  ordered  Protein-calorie malnutrition, severe Continue supplements  Pressure injury of skin Present on admission.  Wounds restaged by wound care nurse.  They have progressed..  1.  Right buttock/ischium stage III.  2.  Left buttock/ischium now unstageable.  Cleanse bilateral buttock wounds with Vashe wound cleanser [name] [phone]), allowed to air dry.  Apply one quarter thick layer of Santyl  to wound beds daily using a Q-tip applicator to fill on the depth of the left buttock wound with normal saline gauze, top right buttock wound with normal saline gauze.  Top dry gauze and secure with silicone foam or ABD pad.  Memory loss Definitely concerned about her memory and living by herself.  Seen by psychiatry and deemed capable of making medical decisions currently.   TOC is engaging DSS -- see their notes. Patient's orientation waxes and wanes, but I agree  with psychiatry's assessment from 6/20 that pt has capacity for making decisions.      Subjective: Pt was sitting up in bed eating breakfast this AM on rounds.  She reports her hairdresser came yesterday and did her hair.  She continues to be eager for discharge to  rehab.   Physical Exam: Vitals:   03/02/24 1532 03/02/24 2017 03/03/24 0411 03/03/24 0917  BP: 99/63 97/60 (!) 111/56 (!) 121/96  Pulse: 82 79 64 78  Resp: 17 20 18 16   Temp: 98.7 F (37.1 C) 98.3 F (36.8 C) 98.4 F (36.9 C) 97.9 F (36.6 C)  TempSrc:      SpO2: 96% 94% 96%   Weight:      Height:       General exam: awake & alert, no acute distress, frail, underweight HEENT: moist mucus membranes, hearing grossly normal  Respiratory system: on room air, normal respiratory effort. Cardiovascular system: RRR, no  peripheral edema Gastrointestinal system: soft, NT, ND Central nervous system: A&O x 3. no gross focal neurologic deficits, normal speech Extremities: severe arthritis deformities of both hands, no edema, normal tone Psychiatry: normal mood, congruent affect    Data Reviewed:  No new labs today. Notable labs from 7/9 -- Normal BMP except BUN 31 and Ca 8.8 CBC with WBC 3.4, hbg stable 11.6  Family Communication: None. No new medical updates to provide at this time.   Disposition: Status is: Inpatient Remains inpatient appropriate because: awaiting SNF placement.     Planned Discharge Destination: Skilled nursing facility    Time spent: 25 minutes  Author: Burnard DELENA Cunning, DO 03/03/2024 1:42 PM  For on call review [email or web address].

## 2024-03-03 NOTE — TOC Progression Note (Signed)
 Transition of Care [facility]) - Progression Note    Patient Details  Name: [name] MRN: [id number] Date of Birth: [birth year]  Transition of [facility]) CM/SW Contact  Dalia GORMAN Fuse, RN Phone Number: 03/03/2024, 3:19 PM  Clinical Narrative:    [facility] sent an email to Vernell ORN, [facility] financial counselor to f/u on [facility] application. TOC also sent a secure email to [employer] requesting an update on guardianship.      Barriers to Discharge: Continued Medical Work up  Expected Discharge Plan and Services                                               Social Determinants of Health (SDOH) Interventions SDOH Screenings   Food Insecurity: Patient Unable To Answer (02/08/2024)  Housing: Unknown (02/08/2024)  Transportation Needs: Patient Unable To Answer (02/08/2024)  Utilities: Patient Unable To Answer (02/08/2024)  Financial Resource Strain: Low Risk  (01/25/2023)   Received from [facility]  Social Connections: Patient Unable To Answer (02/08/2024)  Tobacco Use: Low Risk  (02/07/2024)    Readmission Risk Interventions     No data to display

## 2024-03-04 DIAGNOSIS — U071 COVID-19: Secondary | ICD-10-CM | POA: Diagnosis not present

## 2024-03-04 DIAGNOSIS — F4329 Adjustment disorder with other symptoms: Secondary | ICD-10-CM | POA: Diagnosis not present

## 2024-03-04 LAB — BASIC METABOLIC PANEL WITH GFR
Anion gap: 7 (ref 5–15)
BUN: 27 mg/dL — ABNORMAL HIGH (ref 8–23)
CO2: 27 mmol/L (ref 22–32)
Calcium: 8.8 mg/dL — ABNORMAL LOW (ref 8.9–10.3)
Chloride: 102 mmol/L (ref 98–111)
Creatinine, Ser: 0.63 mg/dL (ref 0.44–1.00)
GFR, Estimated: >60 mL/min (ref >60)
Glucose, Bld: 71 mg/dL (ref 70–99)
Potassium: 3.7 mmol/L (ref 3.5–5.1)
Sodium: 136 mmol/L (ref 135–145)

## 2024-03-04 LAB — IRON AND TIBC
Iron: 41 ug/dL (ref 28–170)
Saturation Ratios: 25 % (ref 10.4–31.8)
TIBC: 167 ug/dL — ABNORMAL LOW (ref 250–450)
UIBC: 126 ug/dL

## 2024-03-04 LAB — CBC
HCT: 35.6 % — ABNORMAL LOW (ref 36.0–46.0)
Hemoglobin: 11.9 g/dL — ABNORMAL LOW (ref 12.0–15.0)
MCH: 30.3 pg (ref 26.0–34.0)
MCHC: 33.4 g/dL (ref 30.0–36.0)
MCV: 90.6 fL (ref 80.0–100.0)
Platelets: 228 K/uL (ref 150–400)
RBC: 3.93 MIL/uL (ref 3.87–5.11)
RDW: 14.5 % (ref 11.5–15.5)
WBC: 6 K/uL (ref 4.0–10.5)
nRBC: 0 % (ref 0.0–0.2)

## 2024-03-04 LAB — VITAMIN D 25 HYDROXY (VIT D DEFICIENCY, FRACTURES): Vit D, 25-Hydroxy: 59.71 ng/mL (ref 30–100)

## 2024-03-04 NOTE — Plan of Care (Signed)

## 2024-03-04 NOTE — Progress Notes (Signed)
 Physical Therapy Treatment Patient Details Name: [name] MRN: [id number] DOB: September 29, 1940 Today's Date: 03/04/2024   History of Present Illness [name] is a 83 y.o. female with medical history significant for Hypothyroidism, rheumatoid arthritis on chronic prednisone  and Renflexis  infusions, followed by rheumatology, osteoarthritis, PCP documentation of several falls requiring ED visits, and who currently uses a walker, who is being admitted for rhabdomyolysis after she was found down by a neighbor. Found to be COVID+ on 6/30.    PT Comments  Up in chair upon arrival to room; endorses having been up most of the day so far.  Requests to change out of hospital gown, but does require min/mod assist from therapist to manipulate/don both upper and lower body clothing. Completes gait x120' with RW, cga/min assist; forward trunk flexion with short, shuffling steps; min cuing for walker position/management.  Decreased cadence; decreased dynamic balance and overall activity tolerance.  Do recommend continued use of RW and +1 with all mobility   If plan is discharge home, recommend the following: Assistance with cooking/housework;A little help with bathing/dressing/bathroom;A little help with walking and/or transfers;Supervision due to cognitive status;Assist for transportation;Help with stairs or ramp for entrance   Can travel by private vehicle        Equipment Recommendations  Rolling walker (2 wheels)    Recommendations for Other Services       Precautions / Restrictions Precautions Precautions: Fall Restrictions Weight Bearing Restrictions Per Provider Order: No     Mobility  Bed Mobility               General bed mobility comments: seated in recliner beginning/end of treatment session    Transfers Overall transfer level: Needs assistance Equipment used: Rolling walker (2 wheels) Transfers: Sit to/from Stand Sit to Stand: Contact guard assist            General transfer comment: tends to pull on RW despite cuing    Ambulation/Gait Ambulation/Gait assistance: Contact guard assist Gait Distance (Feet): 120 Feet Assistive device: Rolling walker (2 wheels)         General Gait Details: forward trunk flexion with short, shuffling steps; min cuing for walker position/management.  Decreased cadence; decreased dynamic balance and overall activity tolerance.  Do recommend continued use of RW and +1 with all mobility   Stairs             Wheelchair Mobility     Tilt Bed    Modified Rankin (Stroke Patients Only)       Balance Overall balance assessment: Needs assistance Sitting-balance support: No upper extremity supported, Feet supported Sitting balance-Leahy Scale: Good     Standing balance support: Bilateral upper extremity supported Standing balance-Leahy Scale: Fair                              Hotel manager: No apparent difficulties  Cognition Arousal: Alert Behavior During Therapy: WFL for tasks assessed/performed   PT - Cognitive impairments: No family/caregiver present to determine baseline                       PT - Cognition Comments: Oriented to self; follows simple commands; frequent cuing for task-sequencing and problem-solving Following commands: Intact Following commands impaired: Follows one step commands with increased time    Cueing Cueing Techniques: Verbal cues, Gestural cues  Exercises Other Exercises Other Exercises: Upper body dressing, min assist--difficulty identifying neck hole vs  arm holes for donning/threading extremities; min assist with zippers and pulling over/down due to limited dexterity bilat UEs Other Exercises: Lower body dressing, min/mod assist to thread LEs, pull over hips.  Standing balance with RW, cga/min assist for safety    General Comments        Pertinent Vitals/Pain Pain Assessment Pain Assessment: No/denies pain     Home Living                          Prior Function            PT Goals (current goals can now be found in the care plan section) Acute Rehab PT Goals Patient Stated Goal: walk more PT Goal Formulation: With patient Potential to Achieve Goals: Good Progress towards PT goals: Progressing toward goals    Frequency    Min 2X/week      PT Plan      Co-evaluation              AM-PAC PT 6 Clicks Mobility   Outcome Measure  Help needed turning from your back to your side while in a flat bed without using bedrails?: None Help needed moving from lying on your back to sitting on the side of a flat bed without using bedrails?: A Little Help needed moving to and from a bed to a chair (including a wheelchair)?: A Little Help needed standing up from a chair using your arms (e.g., wheelchair or bedside chair)?: A Little Help needed to walk in hospital room?: A Little Help needed climbing 3-5 steps with a railing? : A Lot 6 Click Score: 18    End of Session   Activity Tolerance: Patient tolerated treatment well Patient left: in chair;with call bell/phone within reach;with chair alarm set Nurse Communication: Mobility status PT Visit Diagnosis: Muscle weakness (generalized) (M62.81);Difficulty in walking, not elsewhere classified (R26.2);Repeated falls (R29.6);Unsteadiness on feet (R26.81)     Time: 8592-8575 PT Time Calculation (min) (ACUTE ONLY): 17 min  Charges:    $Gait Training: 8-22 mins PT General Charges $$ ACUTE PT VISIT: 1 Visit                    Calel Pisarski H. Delores, PT, DPT, NCS 03/04/24, 2:31 PM [phone]

## 2024-03-04 NOTE — Consult Note (Signed)
 [facility] Consult Follow-up  Patient Name: .[name]  MRN: [id number]  DOB: Mar 11, 1941  Consult Order details:  Orders (From admission, onward)     Start     Ordered   03/04/24 0749  IP CONSULT TO PSYCHIATRY       Ordering Provider: Von Bellis, MD  Provider:  (Not yet assigned)  Question Answer Comment  Location [facility]   Reason for Consult? Psych eval to assess capability to take care of herself.      03/04/24 0748   02/09/24 0856  IP CONSULT TO PSYCHIATRY       Comments: DR Josette savoy  Ordering Provider: Josette Ade, MD  Provider:  Hellen Becket, MD  Question Answer Comment  Location [facility]   Reason for Consult? capacity eval      02/09/24 0855             Mode of Visit: In person    Psychiatry Consult Evaluation  Service Date: March 04, 2024 LOS:  LOS: 25 days  Chief Complaint Capacity eval  Primary Psychiatric Diagnoses  Adjustment disorder unspecified   Assessment  [name] is a 83 y.o. female admitted: Medically 83 y.o. female with  no past psych history and past medical history significant for Hypothyroidism, rheumatoid arthritis on chronic prednisone  and Renflexis  infusions, followed by rheumatology, osteoarthritis, PCP documentation of several falls requiring ED visits, and who currently uses a walker, who is being admitted for rhabdomyolysis after she was found down by a neighbor.  She was on the floor for over 24 hours.  Psychiatric consulted for capacity evaluation regarding possible disposition of home versus SNF.   Capacity is very question specific and there is no assessment that reflects global capacity.  Capacity is a clinical opinion and is fluid and can fluctuate depending on the mental status of the patient.  Capacity is not competency and competency is determined by judicial system.  On today's assessment patient is noted to be at her baseline as of  during last assessment in June by Dr. Kenn.  She continues to express her understanding of the reasons of the hospitalization, the physical limitations she is having, the need for help and is agreeable to go to SNF for rehab.  Patient is able to display capacity to make decisions at this time regarding her clinical care.  Diagnoses:  Active Hospital problems: Principal Problem:   COVID-19 virus infection Active Problems:   Acquired hypothyroidism   Rheumatoid arthritis involving multiple sites (HCC)   Rhabdomyolysis   On chronic immunosuppressive therapy   Multiple falls   AKI (acute kidney injury) (HCC)   Memory loss   Pressure injury of skin   Acute cystitis without hematuria   Protein-calorie malnutrition, severe   Constipation    Plan   ## Psychiatric Medication Recommendations:  No indication for new meds at this time  ## Medical Decision Making Capacity: Patient remains at the same mental status like last time where she is able to talk about the reason for her hospitalization and the concerns about her stability and ability to take care of herself by the medical provider.  She does acknowledge needing the help and going to rehab to gain her strength back.  She is agreeable at this time to the treatment options offered.  Patient has capacity to make medical decision regarding her treatment  ## Further Work-up:   No other additional workup required   ## Disposition:-- There are  no psychiatric contraindications to discharge at this time  ## Behavioral / Environmental: -Delirium Precautions: Delirium Interventions for Nursing and Staff: - RN to open blinds every AM. - To Bedside: Glasses, hearing aide, and pt's own shoes. Make available to patients. when possible and encourage use. - Encourage po fluids when appropriate, keep fluids within reach. - OOB to chair with meals. - Passive ROM exercises to all extremities with AM & PM care. - RN to assess orientation to person,  time and place QAM and PRN. - Recommend extended visitation hours with familiar family/friends as feasible. - Staff to minimize disturbances at night. Turn off television when pt asleep or when not in use.    ## Safety and Observation Level:  - Based on my clinical evaluation, I estimate the patient to be at low risk of self harm in the current setting. - At this time, we recommend  routine. This decision is based on my review of the chart including patient's history and current presentation, interview of the patient, mental status examination, and consideration of suicide risk including evaluating suicidal ideation, plan, intent, suicidal or self-harm behaviors, risk factors, and protective factors. This judgment is based on our ability to directly address suicide risk, implement suicide prevention strategies, and develop a safety plan while the patient is in the clinical setting. Please contact our team if there is a concern that risk level has changed.  CSSR Risk Category:C-SSRS RISK CATEGORY: No Risk  Suicide Risk Assessment: Patient has following modifiable risk factors for suicide: None identified at this time  Patient has following non-modifiable or demographic risk factors for suicide: early widowhood Patient has the following protective factors against suicide: Cultural, spiritual, or religious beliefs that discourage suicide  Thank you for this consult request. Recommendations have been communicated to the primary team.  We will sign off at this time.   Laconda Basich, MD       History of Present Illness  Relevant Aspects of [facility]   Patient Report:    On today's assessment patient is able to reflect back on the events that led up to her hospitalization including having a fall, pressing the life alert, EMS coming and getting her to the hospital.  Patient is able to reflect back on her unsteady gait and the need for help.  She is also able to appreciate her weakness and  the need for help in the hospital.  She was able to inform the provider about her ability to move from her chair to go to the bathroom in the hospital.  She reports that she has been waiting for the rehab at [facility] but has not heard from anybody about the status.  She is able to answer the orientation questions as July 2025 and the president as Nancyann Frohlich.  She was initially hesitant about the orientation questions but after few minutes she answered them.  She denies feeling depressed or anxious.  She denies SI/HI/plan and denies hallucinations.   Psychiatric and Social History  Psychiatric History:  Information collected from patient and chart No current outpatient psychiatrist.   No current outpatient therapist.   No previous inpatient psychiatric admissions.   No previous suicide attempts.     Family Psych History: Unknown Family Hx suicide: Unknown  Social History:   Educational Hx: Unknown Occupational Hx: On Control and instrumentation engineer Hx: Denies Living Situation: Was living alone Spiritual Hx: Unknown Access to weapons/lethal means: Denies  Substance History Alcohol: Denies  Tobacco: Denies Illicit drugs:  Denies Prescription drug abuse: Denies Rehab hx: Denies  Exam Findings  Physical Exam: Reviewed and agree with the physical exam findings conducted by the medical provider Vital Signs:  Temp:  [97.7 F (36.5 C)-98.6 F (37 C)] 98 F (36.7 C) (07/16 1627) Pulse Rate:  [69-101] 101 (07/16 1627) Resp:  [16-18] 18 (07/16 1627) BP: (100-121)/(64-66) 119/64 (07/16 1627) SpO2:  [96 %-98 %] 98 % (07/16 1627) Blood pressure 119/64, pulse (!) 101, temperature 98 F (36.7 C), resp. rate 18, height 5' 6 (1.676 m), weight 39.4 kg, SpO2 98%. Body mass index is 14.02 kg/m.    Mental Status Exam: General Appearance: Casual  Orientation:  Full (Time, Place, and Person)  Memory:  Immediate;   Fair Recent;   Poor Remote;   Poor  Concentration:  Concentration: Fair  and Attention Span: Fair  Recall:  Fair  Attention  Fair  Eye Contact:  Fair  Speech:  Clear and Coherent  Language:  Fair  Volume:  Normal  Mood: fine  Affect:  Appropriate  Thought Process:  Coherent  Thought Content:  Logical  Suicidal Thoughts:  No  Homicidal Thoughts:  No  Judgement:  Fair  Insight:  Fair  Psychomotor Activity:  Normal  Akathisia:  No  Fund of Knowledge:  Fair      Assets:  Desire for Improvement  Cognition:  Impaired,  Mild  ADL's:  Impaired  AIMS (if indicated):        Other History   These have been pulled in through the EMR, reviewed, and updated if appropriate.  Family History:  The patient's family history includes Arthritis in her paternal grandfather and paternal grandmother; Congestive Heart Failure in her father; Heart attack in her maternal grandfather; Pancreatic cancer in her maternal aunt and mother; Rheum arthritis in her father.  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   Procedure: TOTAL HIP ARTHROPLASTY;  Surgeon: Lynwood SHAUNNA Hue, MD;  Location: [facility] ORS;  Service: Orthopedics;  Laterality: Left;     Medications:   Current Facility-Administered Medications:    acetaminophen  (TYLENOL ) tablet 650 mg, 650 mg, Oral, Q6H PRN, 650 mg at 02/29/24 0746 **OR** acetaminophen  (TYLENOL ) suppository 650 mg, 650 mg, Rectal, Q6H PRN, Cleatus Delayne GAILS, MD   ascorbic acid  (VITAMIN C ) tablet 500 mg, 500 mg, Oral, BID, Wieting, Richard, MD, 500 mg at 03/04/24 2136   aspirin  EC tablet 81 mg, 81 mg, Oral, Daily, Josette Ade, MD, 81 mg at 03/04/24 9050   collagenase  (SANTYL ) ointment, , Topical, Daily, Josette Ade, MD, Given at 03/04/24 [phone]   cyanocobalamin  (VITAMIN B12) tablet 1,000 mcg, 1,000 mcg,  Oral, Daily, Wieting, Richard, MD, 1,000 mcg at 03/04/24 0949   enoxaparin  (LOVENOX ) injection 40 mg, 40 mg, Subcutaneous, Q24H, Hallaji, Sheema M, RPH, 40 mg at 03/04/24 2136   feeding supplement (ENSURE PLUS HIGH PROTEIN) liquid 237 mL, 237 mL, Oral, BID BM, Dezii, Alexandra, DO, 237 mL at 03/04/24 1409   ferrous sulfate  tablet 325 mg, 325 mg, Oral, Daily, Wieting, Richard, MD, 325 mg at 03/04/24 0949   folic acid  (FOLVITE ) tablet 1 mg, 1 mg, Oral, Daily, Josette, Richard, MD, 1 mg at 03/04/24 0949   HYDROcodone -acetaminophen  (NORCO/VICODIN) 5-325 MG  per tablet 1-2 tablet, 1-2 tablet, Oral, Q4H PRN, Cleatus Delayne GAILS, MD, 1 tablet at 02/26/24 1543   levothyroxine  (SYNTHROID ) tablet 88 mcg, 88 mcg, Oral, Q0600, Josette Ade, MD, 88 mcg at 03/04/24 0612   liver oil-zinc  oxide (DESITIN) 40 % ointment, , Topical, BID, Josette Ade, MD, Given at 03/04/24 2137   multivitamin with minerals tablet 1 tablet, 1 tablet, Oral, Daily, Meegan, Eryn, RPH, 1 tablet at 03/04/24 0950   ondansetron  (ZOFRAN ) tablet 4 mg, 4 mg, Oral, Q6H PRN, 4 mg at 02/28/24 1050 **OR** ondansetron  (ZOFRAN ) injection 4 mg, 4 mg, Intravenous, Q6H PRN, Cleatus Delayne GAILS, MD   Oral care mouth rinse, 15 mL, Mouth Rinse, PRN, Paudel, Keshab, MD   predniSONE  (DELTASONE ) tablet 5 mg, 5 mg, Oral, Q breakfast, Lenon Pons L, MD, 5 mg at 03/04/24 9050   senna-docusate (Senokot-S) tablet 1 tablet, 1 tablet, Oral, BID, Fausto Sor A, DO, 1 tablet at 03/04/24 2136   [COMPLETED] vitamin A  capsule 100,000 Units, 100,000 Units, Oral, Daily, 100,000 Units at 02/20/24 0917 **FOLLOWED BY** vitamin A  capsule 50,000 Units, 50,000 Units, Oral, Daily, Dezii, Alexandra, DO, 50,000 Units at 03/04/24 0950  Allergies: No Known Allergies  Aleksandar Duve, MD

## 2024-03-04 NOTE — Progress Notes (Signed)
 Mobility Specialist - Progress Note   03/04/24 1057  Mobility  Activity Dangled on edge of bed;Stood at bedside;Ambulated with assistance in room;Transferred from bed to chair  Level of Assistance Contact guard assist, steadying assist  Assistive Device Front wheel walker  Distance Ambulated (ft) 6 ft  Activity Response Tolerated well  Mobility visit 1 Mobility  Mobility Specialist Start Time (ACUTE ONLY) [id number]  Mobility Specialist Stop Time (ACUTE ONLY) 1022  Mobility Specialist Time Calculation (min) (ACUTE ONLY) 24 min   Pt in fowler position upon entry, utilizing RA. Pt agreeable to activity this date. Pt completed bed mob ModA to bring trunk from sup to sit-- able to self support trunk once seated EOB. Pt dangled EOB and completed the following seated exercises: leg extensions 2x6, ankle pumps, and lateral toe taps 1x6. Pt denied seated marches and STS's d/t her pain in bottoms of feet. Pt STS to RW MinA-CGA and transferred to the recliner CGA--- soiled up standing. Pt STS to RW CGA, stood for ~1 min while MS completed peri care, returned seated and dons new gown. Pt left seated with alarm set and needs within reach.  America Silvan Mobility Specialist 03/04/24 12:14 PM

## 2024-03-04 NOTE — Progress Notes (Signed)
 Progress Note   Patient: [name] DOB: 11-11-1940 DOA: 02/07/2024     25 DOS: the patient was seen and examined on 03/04/2024   Brief hospital course: [name] is a 83 y.o. female with medical history significant for Hypothyroidism, rheumatoid arthritis on chronic prednisone  and Renflexis  infusions, followed by rheumatology. They presented for rhabdomyolysis after she was found down by a neighbor > 24 hours when she fell and could not get up. ED course: normal vitals.  Labs showed normal CBC, Creatinine of 1.85 up from baseline of 1.1 to with bicarb of 19.  Mild LFT elevation with AST 43 and total bilirubin 2.7.  Troponin 17.  Urinalysis with many bacteria and small leuks.  EKG showed sinus arrhythmia at 88 CT head and C-spine without acute injury.  Chest x-ray nonacute showing multiple degenerative changes x-ray of the knee showing moderate right knee effusion among other findings-please see report. Patient treated with 2 L NS bolus and pain medicine.     Completed antibiotic for UTI.  IVF given and resolved rhabdo. Rehab was recommended by PT/OT but insurance company denied. TOC consulted for avenues for safe dispo as she lives alone and felt unsafe to be home alone. APS contacted and started case.    6/30 she was screened positive for COVID. She is currently asymptomatic and feels at baseline. She is ready for dc when safe dispo arranged.    I assumed care on 02/26/24. Pt is now off Covid isolation. Patient remains medically stable for discharge pending safe disposition plan. Patient is agreeable to go to SNF, was previously refusing.   Appears DSS pursuing guardianship.     Assessment and Plan:  * COVID-19 virus infection Supportive care.  Diagnosed on 6/30. Now off isolation precautions.  Difficulty voiding - reported 7/11.   UA borderline for questionable UTI with pyuria, rare bacteria. Urine culture insignificant growth Defer antibiotics    Multiple falls Lives by herself.  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  TOC following and DSS has been engaged.  See TOC notes. Iron profile and vitamin D  within normal range. SNF placement is pending.  Rhabdomyolysis CK peaked at 1195.  This problem has resolved.  AKI (acute kidney injury) (HCC) Creatinine improved from 1.85 down to 0.8   Acute cystitis without hematuria E. coli and Klebsiella growing out of urine culture.  Completed antibiotic course.  Rheumatoid arthritis involving multiple sites [facility]) On chronic immunosuppressive therapy Patient takes methotrexate  and folic acid .  Patient placed on prednisone  5 mg daily.  Acquired hypothyroidism Continue levothyroxine  at slightly increased dose with TSH being slightly elevated.  Constipation MiraLAX  ordered  Protein-calorie malnutrition, severe Continue supplements  Pressure injury of skin Present on admission.  Wounds restaged by wound care nurse.  They have progressed..  1.  Right buttock/ischium stage III.  2.  Left buttock/ischium now unstageable.  Cleanse bilateral buttock wounds with Vashe wound cleanser [name] [phone]), allowed to air dry.  Apply one quarter thick layer of Santyl  to wound beds daily using a Q-tip applicator to fill on the depth of the left buttock wound with normal saline gauze, top right buttock wound with normal saline gauze.  Top dry gauze and secure with silicone foam or ABD pad.  Memory loss Definitely concerned about her memory and living by herself.  Seen by psychiatry and deemed capable of making medical decisions currently.   TOC is engaging DSS -- see their  notes. Patient's orientation waxes and wanes, but I agree with psychiatry's assessment from 6/20 that pt has capacity for making decisions. 7/16 psych eval done, patient has capacity and she is able to make decisions about her treatments.      Subjective: No significant events  overnight.  Patient was working with physical therapy and was able to walk with a walker towards the recliner.  She was asking about SNF placement, patient was advised that it is in process. Patient denied any specific complaints.    Physical Exam: Vitals:   03/03/24 2059 03/04/24 0438 03/04/24 0810 03/04/24 1627  BP: 121/60 100/66 121/65 119/64  Pulse: 89 69 71 (!) 101  Resp: 18 16 16 18   Temp: 98.3 F (36.8 C) 98.6 F (37 C) 97.7 F (36.5 C) 98 F (36.7 C)  TempSrc: Oral Oral    SpO2: 100% 97% 96% 98%  Weight:      Height:       General exam: awake & alert, no acute distress, frail, underweight HEENT: moist mucus membranes, hearing grossly normal  Respiratory system: on room air, normal respiratory effort. Cardiovascular system: RRR, no  peripheral edema Gastrointestinal system: soft, NT, ND Central nervous system: A&O x 3. no gross focal neurologic deficits, normal speech Extremities: severe arthritis deformities of both hands, no edema, normal tone Psychiatry: normal mood, congruent affect    Data Reviewed:  No new labs today. Notable labs from 7/9 -- Normal BMP except BUN 31 and Ca 8.8 CBC with WBC 3.4, hbg stable 11.6  Family Communication: None. No new medical updates to provide at this time.   Disposition: Status is: Inpatient Remains inpatient appropriate because: awaiting SNF placement.     Planned Discharge Destination: Skilled nursing facility    Time spent: 25 minutes  Author: Elvan Sor, MD 03/04/2024 6:22 PM  For on call review [email or web address].

## 2024-03-04 NOTE — TOC Progression Note (Signed)
 Transition of Care [facility]) - Progression Note    Patient Details  Name: [name] MRN: [id number] Date of Birth: [birth year]  [facility] of [facility]) CM/SW Contact  Dalia GORMAN Fuse, RN Phone Number: 03/04/2024, 7:37 AM  Clinical Narrative:     TOC received secure email from DSS supervisor [employer] Per [employer]  DSS completed a face to face with her on Friday and asked her to consider going into placement instead of going home due to her inability to provide appropriate care to herself as well as the stage II and unstable wounds on her buttocks.  They requested that a Psychological Assessment be completed to see if they feel the adult has the ability to care for herself and meet her needs. TOC outreached to the hospitalist and requested Psych eval.     Barriers to Discharge: Continued Medical Work up  Expected Discharge Plan and Services                                               Social Determinants of Health (SDOH) Interventions SDOH Screenings   Food Insecurity: Patient Unable To Answer (02/08/2024)  Housing: Unknown (02/08/2024)  Transportation Needs: Patient Unable To Answer (02/08/2024)  Utilities: Patient Unable To Answer (02/08/2024)  Financial Resource Strain: Low Risk  (01/25/2023)   Received from [facility]  Social Connections: Patient Unable To Answer (02/08/2024)  Tobacco Use: Low Risk  (02/07/2024)    Readmission Risk Interventions     No data to display

## 2024-03-04 NOTE — Progress Notes (Signed)
 Father's name

## 2024-03-05 DIAGNOSIS — U071 COVID-19: Secondary | ICD-10-CM | POA: Diagnosis not present

## 2024-03-05 NOTE — [place] FL2 (Signed)
 [place]  MEDICAID FL2 LEVEL OF CARE FORM     IDENTIFICATION  Patient Name: [name] Birthdate: 14-Mar-1941 Sex: female Admission Date (Current Location): 02/07/2024  [facility] [place] and [place] Number:  Chiropodist and Address:  [facility], [address] [place], [place] [address]      Provider Number: [id number]  Attending Physician Name and Address:  Von Bellis, MD  Relative Name and Phone Number:  Landry Moles [phone]    Current Level of Care: Hospital Recommended Level of Care: Nursing Facility (LTC) Prior Approval Number:    Date Approved/Denied:   PASRR Number: [id number]  Discharge Plan: Other (Comment) (LTC Facility)    Current Diagnoses: Patient Active Problem List   Diagnosis Date Noted   Constipation 02/25/2024   COVID-19 virus infection 02/23/2024   Protein-calorie malnutrition, severe 02/10/2024   Acute cystitis without hematuria 02/09/2024   Memory loss 02/08/2024   Pressure injury of skin 02/08/2024   Rhabdomyolysis 02/07/2024   On chronic immunosuppressive therapy 02/07/2024   Multiple falls 02/07/2024   AKI (acute kidney injury) (HCC) 02/07/2024   Cellulitis and abscess of toe 04/16/2022   Callus 02/08/2022   Ulcer of foot, unspecified laterality, limited to breakdown of skin (HCC) 02/08/2022   Paronychia of great toe of right foot 12/07/2021   Hammer toes, bilateral 07/04/2020   Hav (hallux abducto valgus), unspecified laterality 07/04/2020   Right anterior knee pain 05/01/2018   Primary osteoarthritis of both hips 01/31/2018   Swelling of right knee joint 10/01/2017   Encounter for long-term (current) use of high-risk medication 02/28/2017   Rheumatoid arthritis involving multiple sites (HCC) 02/28/2017   Unintentional weight loss 02/28/2017   Bilateral hand pain 02/14/2017   Acquired hypothyroidism 01/21/2017   Osteoarthritis of both knees 01/21/2017   Iron deficiency anemia 11/30/2016   Aftercare  following joint replacement surgery 09/03/2015   Status post total replacement of right hip 08/31/2015   Trochanteric bursitis of left hip 06/13/2015   Hyperlipidemia, unspecified 05/03/2015   Presence of artificial hip joint 07/04/2013    Orientation RESPIRATION BLADDER Height & Weight     Self, Time    Continent Weight: 39.4 kg Height:  5' 6 (167.6 cm)  BEHAVIORAL SYMPTOMS/MOOD NEUROLOGICAL BOWEL NUTRITION STATUS      Continent Diet (Dysphagia 3)  AMBULATORY STATUS COMMUNICATION OF NEEDS Skin   Limited Assist Verbally                         Personal Care Assistance Level of Assistance  Bathing, Feeding, Dressing Bathing Assistance: Limited assistance Feeding assistance: Independent Dressing Assistance: Limited assistance     Functional Limitations Info  Hearing, Speech, Sight Sight Info: Adequate Hearing Info: Adequate Speech Info: Adequate    SPECIAL CARE FACTORS FREQUENCY                       Contractures      Additional Factors Info  Code Status, Allergies Code Status Info: FULL Allergies Info: NKDA           Current Medications (03/05/2024):  This is the current hospital active medication list Current Facility-Administered Medications  Medication Dose Route Frequency Provider Last Rate Last Admin   acetaminophen  (TYLENOL ) tablet 650 mg  650 mg Oral Q6H PRN Duncan, Hazel V, MD   650 mg at 02/29/24 0746   Or   acetaminophen  (TYLENOL ) suppository 650 mg  650 mg Rectal Q6H PRN Duncan, Hazel V,  MD       ascorbic acid  (VITAMIN C ) tablet 500 mg  500 mg Oral BID Josette Ade, MD   500 mg at 03/05/24 1052   aspirin  EC tablet 81 mg  81 mg Oral Daily Josette Ade, MD   81 mg at 03/05/24 1053   collagenase  (SANTYL ) ointment   Topical Daily Josette Ade, MD   Given at 03/05/24 1053   cyanocobalamin  (VITAMIN B12) tablet 1,000 mcg  1,000 mcg Oral Daily Josette Ade, MD   1,000 mcg at 03/05/24 1052   enoxaparin  (LOVENOX ) injection 40 mg  40  mg Subcutaneous Q24H Hallaji, Sheema M, RPH   40 mg at 03/04/24 2136   feeding supplement (ENSURE PLUS HIGH PROTEIN) liquid 237 mL  237 mL Oral BID BM Dezii, Alexandra, DO   237 mL at 03/05/24 1054   ferrous sulfate  tablet 325 mg  325 mg Oral Daily Josette Ade, MD   325 mg at 03/05/24 1052   folic acid  (FOLVITE ) tablet 1 mg  1 mg Oral Daily Josette Ade, MD   1 mg at 03/05/24 1052   HYDROcodone -acetaminophen  (NORCO/VICODIN) 5-325 MG per tablet 1-2 tablet  1-2 tablet Oral Q4H PRN Duncan, Hazel V, MD   1 tablet at 02/26/24 1543   levothyroxine  (SYNTHROID ) tablet 88 mcg  88 mcg Oral Q0600 Josette Ade, MD   88 mcg at 03/05/24 0615   liver oil-zinc  oxide (DESITIN) 40 % ointment   Topical BID Josette Ade, MD   Given at 03/05/24 1054   multivitamin with minerals tablet 1 tablet  1 tablet Oral Daily Meegan, Eryn, RPH   1 tablet at 03/05/24 1052   ondansetron  (ZOFRAN ) tablet 4 mg  4 mg Oral Q6H PRN Duncan, Hazel V, MD   4 mg at 02/28/24 1050   Or   ondansetron  (ZOFRAN ) injection 4 mg  4 mg Intravenous Q6H PRN Duncan, Hazel V, MD       Oral care mouth rinse  15 mL Mouth Rinse PRN Paudel, Nena, MD       predniSONE  (DELTASONE ) tablet 5 mg  5 mg Oral Q breakfast Lenon Marien CROME, MD   5 mg at 03/05/24 1052   senna-docusate (Senokot-S) tablet 1 tablet  1 tablet Oral BID Fausto Sor A, DO   1 tablet at 03/05/24 1053     Discharge Medications: Please see discharge summary for a list of discharge medications.  Relevant Imaging Results:  Relevant Lab Results:   Additional Information SSN: [id number]  Dalia GORMAN Fuse, RN

## 2024-03-05 NOTE — TOC Progression Note (Signed)
 Transition of Care [facility]) - Progression Note    Patient Details  Name: [name] MRN: [id number] Date of Birth: [birth year]  [facility] CM/SW Contact  Dalia GORMAN Fuse, RN Phone Number: 03/05/2024, 1:54 PM  Clinical Narrative:     TOC received update from Vernell Gully with finance Insurance found: [facility] - Medicaid for the Aged [employer].    [name] has full coverage Medicaid.    Insurance Name:            LEMOND                                             Subscriber Name:           HELOISE, GORDAN                                         Member ID:                 [id number]                                           Subscriber DOB:            [birth year]                                           Subscriber Gender:         FEMALE                                               Cov Status:                Eligible                                             Type of Coverage:          MEDICAREC HUMA                                       Primary Insurance:         Patient may have insurance primary to Medicaid. See                              full response.                                       [employer] Reference Number: [id number]  TOC sent updated FL2 out and left message with [employer] advising of insurance   Barriers to Discharge: Continued Medical Work up  Expected Discharge Plan and Services                                               Social Determinants of Health (SDOH) Interventions SDOH Screenings   Food Insecurity: Patient Unable To Answer (02/08/2024)  Housing: Unknown (02/08/2024)  Transportation Needs: Patient Unable To Answer (02/08/2024)  Utilities: Patient Unable To Answer (02/08/2024)  Financial Resource Strain: Low Risk  (01/25/2023)   Received from [facility]  Social Connections: Patient Unable To Answer (02/08/2024)  Tobacco Use: Low Risk  (02/07/2024)     Readmission Risk Interventions     No data to display

## 2024-03-05 NOTE — Plan of Care (Signed)
 Patient remains free from any noted signs of acute distress.  Has not requested any prn medication to assist with symptom management.  Remains free from any noted signs of associated symptoms.  No additional medical interventions required.  Patient to continue to be monitored by hospital staff until discharged.

## 2024-03-05 NOTE — Progress Notes (Signed)
 Mobility Specialist - Progress Note   03/05/24 1033  Mobility  Activity Stood at bedside  Level of Assistance Contact guard assist, steadying assist  Assistive Device Front wheel walker  Distance Ambulated (ft) 2 ft  Activity Response Tolerated well  Mobility visit 1 Mobility  Mobility Specialist Start Time (ACUTE ONLY) 1004  Mobility Specialist Stop Time (ACUTE ONLY) 1017  Mobility Specialist Time Calculation (min) (ACUTE ONLY) 13 min   Pt sitting in the recliner upon entry, utilizing RA. Pt agreeable to limited activity this date. Pt completed leg extension 2x6 and pillow squeeze x3. Pt STS to RW MinA-CGA, took ~2 lateral steps towards the middle of the recliner-- denied further activity. Pt left seated in the recliner with alarm sent and needs within reach.  America Silvan Mobility Specialist 03/05/24 11:21 AM

## 2024-03-05 NOTE — Plan of Care (Signed)

## 2024-03-05 NOTE — Progress Notes (Signed)
 Progress Note   Patient: [name] FMW:[id number] DOB: [birth year] DOA: 02/07/2024     26 DOS: the patient was seen and examined on 03/05/2024   Brief hospital course: [name] is a 83 y.o. female with medical history significant for Hypothyroidism, rheumatoid arthritis on chronic prednisone  and Renflexis  infusions, followed by rheumatology. They presented for rhabdomyolysis after she was found down by a neighbor > 24 hours when she fell and could not get up. ED course: normal vitals.  Labs showed normal CBC, Creatinine of 1.85 up from baseline of 1.1 to with bicarb of 19.  Mild LFT elevation with AST 43 and total bilirubin 2.7.  Troponin 17.  Urinalysis with many bacteria and small leuks.  EKG showed sinus arrhythmia at 88 CT head and C-spine without acute injury.  Chest x-ray nonacute showing multiple degenerative changes x-ray of the knee showing moderate right knee effusion among other findings-please see report. Patient treated with 2 L NS bolus and pain medicine.     Completed antibiotic for UTI.  IVF given and resolved rhabdo. Rehab was recommended by PT/OT but insurance company denied. TOC consulted for avenues for safe dispo as she lives alone and felt unsafe to be home alone. APS contacted and started case.    6/30 she was screened positive for COVID. She is currently asymptomatic and feels at baseline. She is ready for dc when safe dispo arranged.    I assumed care on 02/26/24. Pt is now off Covid isolation. Patient remains medically stable for discharge pending safe disposition plan. Patient is agreeable to go to SNF, was previously refusing.   Appears DSS pursuing guardianship.     Assessment and Plan:  * COVID-19 virus infection Supportive care.  Diagnosed on 6/30. Now off isolation precautions.  Difficulty voiding - reported 7/11.   UA borderline for questionable UTI with pyuria, rare bacteria. Urine culture insignificant growth Defer antibiotics    Multiple falls Lives by herself.  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  TOC following and DSS has been engaged.  See TOC notes. Iron profile and vitamin D  within normal range. SNF placement is pending.  Rhabdomyolysis CK peaked at 1195.  This problem has resolved.  AKI (acute kidney injury) (HCC) Creatinine improved from 1.85 down to 0.8   Acute cystitis without hematuria E. coli and Klebsiella growing out of urine culture.  Completed antibiotic course.  Rheumatoid arthritis involving multiple sites [facility]) On chronic immunosuppressive therapy Patient takes methotrexate  and folic acid .  Patient placed on prednisone  5 mg daily.  Acquired hypothyroidism Continue levothyroxine  at slightly increased dose with TSH being slightly elevated.  Constipation MiraLAX  ordered  Protein-calorie malnutrition, severe Continue supplements  Pressure injury of skin Present on admission.  Wounds restaged by wound care nurse.  They have progressed..  1.  Right buttock/ischium stage III.  2.  Left buttock/ischium now unstageable.  Cleanse bilateral buttock wounds with Vashe wound cleanser [name] [phone]), allowed to air dry.  Apply one quarter thick layer of Santyl  to wound beds daily using a Q-tip applicator to fill on the depth of the left buttock wound with normal saline gauze, top right buttock wound with normal saline gauze.  Top dry gauze and secure with silicone foam or ABD pad.  Memory loss Definitely concerned about her memory and living by herself.  Seen by psychiatry and deemed capable of making medical decisions currently.   TOC is engaging DSS -- see their  notes. Patient's orientation waxes and wanes, but I agree with psychiatry's assessment from 6/20 that pt has capacity for making decisions. 7/16 psych eval done, patient has capacity and she is able to make decisions about her treatments.      Subjective: No significant events  overnight.  Patient was sitting already on the recliner.  Denied any complaints.     Physical Exam: Vitals:   03/04/24 0810 03/04/24 1627 03/05/24 0408 03/05/24 0755  BP: 121/65 119/64 (!) 122/56 111/60  Pulse: 71 (!) 101 67 64  Resp: 16 18 18 16   Temp: 97.7 F (36.5 C) 98 F (36.7 C) 97.9 F (36.6 C) 97.6 F (36.4 C)  TempSrc:      SpO2: 96% 98% 100% 98%  Weight:      Height:       General exam: awake & alert, no acute distress, frail, underweight HEENT: moist mucus membranes, hearing grossly normal  Respiratory system: on room air, normal respiratory effort. Cardiovascular system: RRR, no  peripheral edema Gastrointestinal system: soft, NT, ND Central nervous system: A&O x 3. no gross focal neurologic deficits, normal speech Extremities: severe arthritis deformities of both hands, no edema, normal tone Psychiatry: normal mood, congruent affect    Data Reviewed:  No new labs today. Notable labs from 7/9 -- Normal BMP except BUN 31 and Ca 8.8 CBC with WBC 3.4, hbg stable 11.6  Family Communication: None. No new medical updates to provide at this time.   Disposition: Status is: Inpatient Remains inpatient appropriate because: awaiting SNF placement.     Planned Discharge Destination: Skilled nursing facility    Time spent: 25 minutes  Author: Elvan Sor, MD 03/05/2024 4:53 PM  For on call review [email or web address].

## 2024-03-05 NOTE — Progress Notes (Signed)
 Occupational Therapy Treatment Patient Details Name: [name] MRN: [id number] DOB: 24-May-1941 Today's Date: 03/05/2024   History of present illness [name] is a 83 y.o. female with medical history significant for Hypothyroidism, rheumatoid arthritis on chronic prednisone  and Renflexis  infusions, followed by rheumatology, osteoarthritis, PCP documentation of several falls requiring ED visits, and who currently uses a walker, who is being admitted for rhabdomyolysis after she was found down by a neighbor. Found to be COVID+ on 6/30.   OT comments  [name] was seen for OT treatment on this date. Upon arrival to room seated in recliner, finishing up breakfast, and agreeable to OT Tx session. OT facilitated ADL management as described below. See ADL section for additional details regarding occupational performance. Pt continues to be functionally limited by decreased cognition, decreased activity tolerance, and generalized weakness. Pt return verbalizes understanding of education provided t/o session. Pt is progressing toward OT goals and continues to benefit from skilled OT services to maximize return to PLOF and minimize risk of future falls, injury, deconditioning, and readmission. Will continue to follow POC as written. Discharge recommendation remains appropriate.       If plan is discharge home, recommend the following:  A little help with walking and/or transfers;A little help with bathing/dressing/bathroom;Assistance with cooking/housework;Direct supervision/assist for medications management;Direct supervision/assist for financial management;Assist for transportation;Supervision due to cognitive status;Help with stairs or ramp for entrance   Equipment Recommendations  Other (comment) (defer)    Recommendations for Other Services      Precautions / Restrictions Precautions Precautions: Fall Recall of Precautions/Restrictions: Impaired Restrictions Weight Bearing Restrictions  Per Provider Order: No       Mobility Bed Mobility Overal bed mobility: Needs Assistance             General bed mobility comments: seated in recliner beginning/end of treatment session    Transfers Overall transfer level: Needs assistance Equipment used: Rolling walker (2 wheels) Transfers: Sit to/from Stand Sit to Stand: Supervision     Step pivot transfers: Supervision           Balance Overall balance assessment: Needs assistance Sitting-balance support: No upper extremity supported, Feet supported Sitting balance-Leahy Scale: Good Sitting balance - Comments: steady reaching within BOS   Standing balance support: Bilateral upper extremity supported, During functional activity, Reliant on assistive device for balance, Single extremity supported Standing balance-Leahy Scale: Fair                             ADL either performed or assessed with clinical judgement   ADL Overall ADL's : Needs assistance/impaired Eating/Feeding: Sitting;Set up Eating/Feeding Details (indicate cue type and reason): Improved ability to load fork/spoon this date, able to self-feed sitting upright in recliner without difficulty. Continues to require set up assist to open meal tray items. Grooming: Standing;Wash/dry face;Oral care;Brushing hair;Set up;Supervision/safety Grooming Details (indicate cue type and reason): Performs standing grooming tasks with encouragement and requires x1 seated therapeutic rest break druing ~10 minute standing period. Pt educated on importance of regular oral care with and without her false teeth.                             Functional mobility during ADLs: Rolling walker (2 wheels);Supervision/safety;Set up General ADL Comments: Cueing for safe transfer technique, falls prevention, and safe use of RW t/o session.    Extremity/Trunk Assessment  Vision Baseline Vision/History: 1 Wears glasses Patient Visual Report:  No change from baseline     Perception     Praxis     Communication Communication Communication: No apparent difficulties   Cognition Arousal: Alert Behavior During Therapy: WFL for tasks assessed/performed Cognition: History of cognitive impairments     Awareness: Online awareness impaired, Intellectual awareness intact Memory impairment (select all impairments): Short-term memory, Working memory, Non-declarative long-term memory Attention impairment (select first level of impairment): Selective attention Executive functioning impairment (select all impairments): Reasoning, Problem solving                   Following commands: Intact Following commands impaired: Follows one step commands with increased time      Cueing   Cueing Techniques: Verbal cues, Gestural cues  Exercises Other Exercises Other Exercises: OT facilitated ADL management with education and assist as described above.    Shoulder Instructions       General Comments      Pertinent Vitals/ Pain       Pain Assessment Pain Assessment: No/denies pain  Home Living                                          Prior Functioning/Environment              Frequency  Min 2X/week        Progress Toward Goals  OT Goals(current goals can now be found in the care plan section)  Progress towards OT goals: Progressing toward goals  Acute Rehab OT Goals Patient Stated Goal: to go home OT Goal Formulation: With patient Time For Goal Achievement: 03/16/24 Potential to Achieve Goals: Good  Plan      Co-evaluation                 AM-PAC OT 6 Clicks Daily Activity     Outcome Measure   Help from another person eating meals?: A Little Help from another person taking care of personal grooming?: A Little Help from another person toileting, which includes using toliet, bedpan, or urinal?: A Little Help from another person bathing (including washing, rinsing, drying)?: A  Little Help from another person to put on and taking off regular upper body clothing?: A Little Help from another person to put on and taking off regular lower body clothing?: A Little 6 Click Score: 18    End of Session Equipment Utilized During Treatment: Rolling walker (2 wheels)  OT Visit Diagnosis: Unsteadiness on feet (R26.81);Other abnormalities of gait and mobility (R26.89);Repeated falls (R29.6);Muscle weakness (generalized) (M62.81)   Activity Tolerance Patient tolerated treatment well   Patient Left with call bell/phone within reach;with chair alarm set;in chair   Nurse Communication Mobility status        Time: 1031-1057 OT Time Calculation (min): 26 min  Charges: OT General Charges $OT Visit: 1 Visit OT Treatments $Self Care/Home Management : 23-37 mins  Jhonny Pelton, M.S., OTR/L 03/05/24, 12:30 PM

## 2024-03-06 DIAGNOSIS — U071 COVID-19: Secondary | ICD-10-CM | POA: Diagnosis not present

## 2024-03-06 NOTE — TOC Progression Note (Signed)
 Transition of Care [facility]) - Progression Note    Patient Details  Name: [name] MRN: [id number] Date of Birth: 15-Apr-1941  [facility]) CM/SW Contact  Dalia GORMAN Fuse, RN Phone Number: 03/06/2024, 10:07 AM  Clinical Narrative:     LTC  bed offers pending    Barriers to Discharge: Continued Medical Work up  Expected Discharge Plan and Services                                               Social Determinants of Health (SDOH) Interventions SDOH Screenings   Food Insecurity: Patient Unable To Answer (02/08/2024)  Housing: Unknown (02/08/2024)  Transportation Needs: Patient Unable To Answer (02/08/2024)  Utilities: Patient Unable To Answer (02/08/2024)  Financial Resource Strain: Low Risk  (01/25/2023)   Received from [facility]  Social Connections: Patient Unable To Answer (02/08/2024)  Tobacco Use: Low Risk  (02/07/2024)    Readmission Risk Interventions     No data to display

## 2024-03-06 NOTE — Plan of Care (Signed)
 Patient remains free from any noted signs of acute changes.  No acute changes in current physical status/condition.  No additional medical interventions required at this time.  Patient to continue to be monitored by hospital staff until discharged.

## 2024-03-06 NOTE — Progress Notes (Signed)
 Progress Note   Patient: [name] FMW:[id number] DOB: 1941/05/23 DOA: 02/07/2024     27 DOS: the patient was seen and examined on 03/06/2024   Brief hospital course: NYAJAH [name] is a 83 y.o. female with medical history significant for Hypothyroidism, rheumatoid arthritis on chronic prednisone  and Renflexis  infusions, followed by rheumatology. They presented for rhabdomyolysis after she was found down by a neighbor > 24 hours when she fell and could not get up. ED course: normal vitals.  Labs showed normal CBC, Creatinine of 1.85 up from baseline of 1.1 to with bicarb of 19.  Mild LFT elevation with AST 43 and total bilirubin 2.7.  Troponin 17.  Urinalysis with many bacteria and small leuks.  EKG showed sinus arrhythmia at 88 CT head and C-spine without acute injury.  Chest x-ray nonacute showing multiple degenerative changes x-ray of the knee showing moderate right knee effusion among other findings-please see report. Patient treated with 2 L NS bolus and pain medicine.     Completed antibiotic for UTI.  IVF given and resolved rhabdo. Rehab was recommended by PT/OT but insurance company denied. TOC consulted for avenues for safe dispo as she lives alone and felt unsafe to be home alone. APS contacted and started case.    6/30 she was screened positive for COVID. She is currently asymptomatic and feels at baseline. She is ready for dc when safe dispo arranged.    I assumed care on 02/26/24. Pt is now off Covid isolation. Patient remains medically stable for discharge pending safe disposition plan. Patient is agreeable to go to SNF, was previously refusing.   Appears DSS pursuing guardianship.     Assessment and Plan:  * COVID-19 virus infection Supportive care.  Diagnosed on 6/30. Now off isolation precautions.  Difficulty voiding - reported 7/11.   UA borderline for questionable UTI with pyuria, rare bacteria. Urine culture insignificant growth Defer antibiotics    Multiple falls Lives by herself.  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  TOC following and DSS has been engaged.  See TOC notes. Iron profile and vitamin D  within normal range. SNF placement is pending.  Rhabdomyolysis CK peaked at 1195.  This problem has resolved.  AKI (acute kidney injury) (HCC) Creatinine improved from 1.85 down to 0.8   Acute cystitis without hematuria E. coli and Klebsiella growing out of urine culture.  Completed antibiotic course.  Rheumatoid arthritis involving multiple sites [facility]) On chronic immunosuppressive therapy Patient takes methotrexate  and folic acid .  Patient placed on prednisone  5 mg daily.  Acquired hypothyroidism Continue levothyroxine  at slightly increased dose with TSH being slightly elevated.  Constipation MiraLAX  ordered  Protein-calorie malnutrition, severe Continue supplements  Pressure injury of skin Present on admission.  Wounds restaged by wound care nurse.  They have progressed..  1.  Right buttock/ischium stage III.  2.  Left buttock/ischium now unstageable.  Cleanse bilateral buttock wounds with Vashe wound cleanser [name] [phone]), allowed to air dry.  Apply one quarter thick layer of Santyl  to wound beds daily using a Q-tip applicator to fill on the depth of the left buttock wound with normal saline gauze, top right buttock wound with normal saline gauze.  Top dry gauze and secure with silicone foam or ABD pad.  Memory loss Definitely concerned about her memory and living by herself.  Seen by psychiatry and deemed capable of making medical decisions currently.   TOC is engaging DSS -- see their  notes. Patient's orientation waxes and wanes, but I agree with psychiatry's assessment from 6/20 that pt has capacity for making decisions. 7/16 psych eval done, patient has capacity and she is able to make decisions about her treatments.      Subjective: No significant events  overnight.  Patient was sitting hardly in the bed, denied any complaints.    Physical Exam: Vitals:   03/05/24 0408 03/05/24 0755 03/05/24 2001 03/06/24 0801  BP: (!) 122/56 111/60 (!) 103/54 108/60  Pulse: 67 64 89 72  Resp: 18 16 18 17   Temp: 97.9 F (36.6 C) 97.6 F (36.4 C)  97.8 F (36.6 C)  TempSrc:      SpO2: 100% 98% 97% 100%  Weight:      Height:       General exam: awake & alert, no acute distress, frail, underweight HEENT: moist mucus membranes, hearing grossly normal  Respiratory system: on room air, normal respiratory effort. Cardiovascular system: RRR, no  peripheral edema Gastrointestinal system: soft, NT, ND Central nervous system: A&O x 3. no gross focal neurologic deficits, normal speech Extremities: severe arthritis deformities of both hands, no edema, normal tone Psychiatry: normal mood, congruent affect    Data Reviewed:  No new labs today. Notable labs from 7/9 -- Normal BMP except BUN 31 and Ca 8.8 CBC with WBC 3.4, hbg stable 11.6  Family Communication: None. No new medical updates to provide at this time.   Disposition: Status is: Inpatient Remains inpatient appropriate because: awaiting SNF placement.   Patient is medically ready to discharge.  Awaiting for LTC bed offer as per [facility]   Planned Discharge Destination: Skilled nursing facility    Time spent: 25 minutes  Author: Elvan Sor, MD 03/06/2024 4:27 PM  For on call review [email or web address].

## 2024-03-06 NOTE — Progress Notes (Signed)
 Physical Therapy Treatment Patient Details Name: [name] MRN: [id number] DOB: Nov 01, 1940 Today's Date: 03/06/2024   History of Present Illness [name] is a 83 y.o. female with medical history significant for Hypothyroidism, rheumatoid arthritis on chronic prednisone  and Renflexis  infusions, followed by rheumatology, osteoarthritis, PCP documentation of several falls requiring ED visits, and who currently uses a walker, who is being admitted for rhabdomyolysis after she was found down by a neighbor. Found to be COVID+ on 6/30.    PT Comments  Pt received upright in bed agreeable to PT. Limited capacity for activity this date. Completes bed mobility mod-I albeit with significant effort and bed features. Set up assist for donning pants in sitting. Dependent for donning shoes. Needs supervision to STS and ambulation to bathroom. Passes BM. Needs minA+1 for STS from low commode dependent for pericare. Performs hand hygiene supervision declining further gait as pt endorses fatigue but seems self limiting by end of session. Pt placed in recliner with all needs in reach with NSG present in room.   If plan is discharge home, recommend the following: Assistance with cooking/housework;A little help with bathing/dressing/bathroom;A little help with walking and/or transfers;Supervision due to cognitive status;Assist for transportation;Help with stairs or ramp for entrance   Can travel by private vehicle     Yes  Equipment Recommendations  Rolling walker (2 wheels)    Recommendations for Other Services       Precautions / Restrictions Precautions Precautions: Fall Recall of Precautions/Restrictions: Impaired Restrictions Weight Bearing Restrictions Per Provider Order: No     Mobility  Bed Mobility Overal bed mobility: Needs Assistance Bed Mobility: Supine to Sit     Supine to sit: Supervision, HOB elevated, Used rails     General bed mobility comments: HOB significantly  inclined. Increased time and effort to sit EOB. Patient Response: Cooperative  Transfers Overall transfer level: Needs assistance Equipment used: Rolling walker (2 wheels) Transfers: Sit to/from Stand Sit to Stand: Supervision, Min assist           General transfer comment: supervision from EOB. MinA+1 from toilet in bathroom    Ambulation/Gait Ambulation/Gait assistance: Contact guard assist Gait Distance (Feet): 40 Feet Assistive device: Rolling walker (2 wheels) Gait Pattern/deviations: Narrow base of support, Decreased step length - right, Decreased step length - left, Decreased stride length, Trunk flexed       General Gait Details: Pt endorses fatigue and declining gait into hallway only completing distance to bathroom back to recliner.   Stairs             Wheelchair Mobility     Tilt Bed Tilt Bed Patient Response: Cooperative  Modified Rankin (Stroke Patients Only)       Balance Overall balance assessment: Needs assistance Sitting-balance support: No upper extremity supported, Feet supported Sitting balance-Leahy Scale: Good Sitting balance - Comments: able to lean anteriorly to don hospital pants with set up assist.   Standing balance support: Single extremity supported Standing balance-Leahy Scale: Fair Standing balance comment: with SUE support, able to doff underwear and hospital pants in standing                            Communication Communication Communication: No apparent difficulties  Cognition Arousal: Alert Behavior During Therapy: WFL for tasks assessed/performed   PT - Cognitive impairments: No family/caregiver present to determine baseline  Following commands: Intact Following commands impaired: Follows one step commands with increased time    Cueing Cueing Techniques: Verbal cues, Gestural cues  Exercises      General Comments General comments (skin integrity, edema, etc.):  pads on buttocks in place      Pertinent Vitals/Pain Pain Assessment Pain Assessment: No/denies pain    Home Living                          Prior Function            PT Goals (current goals can now be found in the care plan section) Acute Rehab PT Goals Patient Stated Goal: walk more PT Goal Formulation: With patient Time For Goal Achievement: 03/11/24 Potential to Achieve Goals: Good Progress towards PT goals: Progressing toward goals    Frequency    Min 2X/week      PT Plan      Co-evaluation              AM-PAC PT 6 Clicks Mobility   Outcome Measure  Help needed turning from your back to your side while in a flat bed without using bedrails?: A Little Help needed moving from lying on your back to sitting on the side of a flat bed without using bedrails?: A Little Help needed moving to and from a bed to a chair (including a wheelchair)?: A Little Help needed standing up from a chair using your arms (e.g., wheelchair or bedside chair)?: A Little Help needed to walk in hospital room?: A Little Help needed climbing 3-5 steps with a railing? : A Lot 6 Click Score: 17    End of Session Equipment Utilized During Treatment: Gait belt Activity Tolerance: Patient limited by fatigue Patient left: in chair;with call bell/phone within reach;with chair alarm set;with nursing/sitter in room Nurse Communication: Mobility status PT Visit Diagnosis: Muscle weakness (generalized) (M62.81);Difficulty in walking, not elsewhere classified (R26.2);Repeated falls (R29.6);Unsteadiness on feet (R26.81)     Time: 9063-9044 PT Time Calculation (min) (ACUTE ONLY): 19 min  Charges:    $Therapeutic Activity: 8-22 mins PT General Charges $$ ACUTE PT VISIT: 1 Visit                     Dorina HERO. Fairly IV, PT, DPT Physical Therapist- [place]  [facility] 03/06/2024, 10:15 AM

## 2024-03-06 NOTE — Progress Notes (Signed)
 Occupational Therapy Treatment Patient Details Name: [name] MRN: [id number] DOB: 19-Apr-1941 Today's Date: 03/06/2024   History of present illness [name] is a 83 y.o. female with medical history significant for Hypothyroidism, rheumatoid arthritis on chronic prednisone  and Renflexis  infusions, followed by rheumatology, osteoarthritis, PCP documentation of several falls requiring ED visits, and who currently uses a walker, who is being admitted for rhabdomyolysis after she was found down by a neighbor. Found to be COVID+ on 6/30.   OT comments  Pt seen for OT treatment on this date. Upon arrival to room pt supine in bed with HOB elevated, agreeable to tx. Pt requires CGA for supine to sit EOB, functional mobility at RW level to bathroom with CGA and Min A for toilet transfer using grab bar.  Toileting hygiene and clothing management completed with SBA seated and CGA for standing components.  Patient stood at sink to complete grooming tasks.  At end of session, patient requested to return to bed even with encouragement to sit in recliner however patient declined and returned to bed with SBA.  Pt making progress toward goals, will continue to follow POC. Discharge recommendation remains appropriate.         If plan is discharge home, recommend the following:  A little help with walking and/or transfers;A little help with bathing/dressing/bathroom;Assistance with cooking/housework;Direct supervision/assist for medications management;Direct supervision/assist for financial management;Assist for transportation;Supervision due to cognitive status;Help with stairs or ramp for entrance   Equipment Recommendations       Recommendations for Other Services      Precautions / Restrictions Precautions Precautions: Fall Recall of Precautions/Restrictions: Impaired       Mobility Bed Mobility Overal bed mobility: Needs Assistance Bed Mobility: Supine to Sit     Supine to sit:  Supervision, HOB elevated, Used rails Sit to supine: Supervision, HOB elevated        Transfers Overall transfer level: Needs assistance Equipment used: Rolling walker (2 wheels) Transfers: Sit to/from Stand Sit to Stand: Contact guard assist, Min assist           General transfer comment: CGA from EOB and CGA to Min A from commode with use of grab bar     Balance Overall balance assessment: Needs assistance Sitting-balance support: No upper extremity supported, Feet supported Sitting balance-Leahy Scale: Good     Standing balance support: Single extremity supported Standing balance-Leahy Scale: Fair Standing balance comment: limited safety awareness during ADLs or tasks that require split attention                           ADL either performed or assessed with clinical judgement   ADL                       Lower Body Dressing: Minimal assistance;Sit to/from stand Lower Body Dressing Details (indicate cue type and reason): donning shoes Toilet Transfer: Rolling walker (2 wheels);Contact guard assist;Grab bars   Toileting- Clothing Manipulation and Hygiene: Contact guard assist;Sit to/from stand;Cueing for safety       Functional mobility during ADLs: Rolling walker (2 wheels);Contact guard assist General ADL Comments: Cueing for safe transfer technique, falls prevention, and safe use of RW t/o session.    Extremity/Trunk Assessment Upper Extremity Assessment Upper Extremity Assessment: Generalized weakness RUE Deficits / Details: R hand deformities, R>L LUE Deficits / Details: L hand deformities            Vision  Baseline Vision/History: 1 Wears glasses     Perception     Praxis     Communication Communication Communication: No apparent difficulties   Cognition Arousal: Alert Behavior During Therapy: WFL for tasks assessed/performed Cognition: History of cognitive impairments                                         Cueing      Exercises Other Exercises Other Exercises: Safety awareness training at RW level during ADL task engagement    Shoulder Instructions       General Comments pads on buttocks in place    Pertinent Vitals/ Pain       Pain Assessment Pain Assessment: No/denies pain  Home Living                                          Prior Functioning/Environment              Frequency  Min 2X/week        Progress Toward Goals  OT Goals(current goals can now be found in the care plan section)  Progress towards OT goals: Progressing toward goals     Plan      Co-evaluation                 AM-PAC OT 6 Clicks Daily Activity     Outcome Measure   Help from another person eating meals?: A Little Help from another person taking care of personal grooming?: A Little Help from another person toileting, which includes using toliet, bedpan, or urinal?: A Little Help from another person bathing (including washing, rinsing, drying)?: A Little Help from another person to put on and taking off regular upper body clothing?: A Little Help from another person to put on and taking off regular lower body clothing?: A Little 6 Click Score: 18    End of Session Equipment Utilized During Treatment: Gait belt;Rolling walker (2 wheels)  OT Visit Diagnosis: Unsteadiness on feet (R26.81);Other abnormalities of gait and mobility (R26.89);Repeated falls (R29.6);Muscle weakness (generalized) (M62.81)   Activity Tolerance Patient tolerated treatment well   Patient Left in bed;with call bell/phone within reach;with bed alarm set   Nurse Communication Mobility status        Time: 8945-8889 OT Time Calculation (min): 16 min  Charges: OT General Charges $OT Visit: 1 Visit   Harlene Sharps OTR/L    Harlene LITTIE Sharps 03/06/2024, 1:17 PM

## 2024-03-06 NOTE — Plan of Care (Signed)

## 2024-03-07 DIAGNOSIS — U071 COVID-19: Secondary | ICD-10-CM | POA: Diagnosis not present

## 2024-03-07 NOTE — Plan of Care (Signed)

## 2024-03-07 NOTE — Progress Notes (Signed)
 Progress Note   Patient: [name] FMW:[id number] DOB: 1941-02-24 DOA: 02/07/2024     28 DOS: the patient was seen and examined on 03/07/2024   Brief hospital course: ZARAYAH LANTING is a 83 y.o. female with medical history significant for Hypothyroidism, rheumatoid arthritis on chronic prednisone  and Renflexis  infusions, followed by rheumatology. They presented for rhabdomyolysis after she was found down by a neighbor > 24 hours when she fell and could not get up. ED course: normal vitals.  Labs showed normal CBC, Creatinine of 1.85 up from baseline of 1.1 to with bicarb of 19.  Mild LFT elevation with AST 43 and total bilirubin 2.7.  Troponin 17.  Urinalysis with many bacteria and small leuks.  EKG showed sinus arrhythmia at 88 CT head and C-spine without acute injury.  Chest x-ray nonacute showing multiple degenerative changes x-ray of the knee showing moderate right knee effusion among other findings-please see report. Patient treated with 2 L NS bolus and pain medicine.     Completed antibiotic for UTI.  IVF given and resolved rhabdo. Rehab was recommended by PT/OT but insurance company denied. TOC consulted for avenues for safe dispo as she lives alone and felt unsafe to be home alone. APS contacted and started case.    6/30 she was screened positive for COVID. She is currently asymptomatic and feels at baseline. She is ready for dc when safe dispo arranged.    I assumed care on 02/26/24. Pt is now off Covid isolation. Patient remains medically stable for discharge pending safe disposition plan. Patient is agreeable to go to SNF, was previously refusing.   Appears DSS pursuing guardianship.     Assessment and Plan:  * COVID-19 virus infection Supportive care.  Diagnosed on 6/30. Now off isolation precautions.  Difficulty voiding - reported 7/11.   UA borderline for questionable UTI with pyuria, rare bacteria. Urine culture insignificant growth Defer antibiotics    Multiple falls Lives by herself.  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  TOC following and DSS has been engaged.  See TOC notes. Iron profile and vitamin D  within normal range. SNF placement is pending.  Rhabdomyolysis CK peaked at 1195.  This problem has resolved.  AKI (acute kidney injury) (HCC) Creatinine improved from 1.85 down to 0.8   Acute cystitis without hematuria E. coli and Klebsiella growing out of urine culture.  Completed antibiotic course.  Rheumatoid arthritis involving multiple sites [facility]) On chronic immunosuppressive therapy Patient takes methotrexate  and folic acid .  Patient placed on prednisone  5 mg daily.  Acquired hypothyroidism Continue levothyroxine  at slightly increased dose with TSH being slightly elevated.  Constipation MiraLAX  ordered  Protein-calorie malnutrition, severe Continue supplements  Pressure injury of skin Present on admission.  Wounds restaged by wound care nurse.  They have progressed..  1.  Right buttock/ischium stage III.  2.  Left buttock/ischium now unstageable.  Cleanse bilateral buttock wounds with Vashe wound cleanser [name] [phone]), allowed to air dry.  Apply one quarter thick layer of Santyl  to wound beds daily using a Q-tip applicator to fill on the depth of the left buttock wound with normal saline gauze, top right buttock wound with normal saline gauze.  Top dry gauze and secure with silicone foam or ABD pad.  Memory loss Definitely concerned about her memory and living by herself.  Seen by psychiatry and deemed capable of making medical decisions currently.   TOC is engaging DSS -- see their  notes. Patient's orientation waxes and wanes, but I agree with psychiatry's assessment from 6/20 that pt has capacity for making decisions. 7/16 psych eval done, patient has capacity and she is able to make decisions about her treatments.      Subjective: No significant events  overnight.  Patient was laying in the bed comfortably without any complaints. Patient was awaiting to hear updates regarding SNF from [facility].   Physical Exam: Vitals:   03/06/24 2135 03/07/24 0359 03/07/24 0831 03/07/24 1446  BP: (!) 110/52 (!) 120/59 (!) 116/50 107/67  Pulse: 83 77 61 89  Resp:   16 16  Temp: 98.5 F (36.9 C) 98.2 F (36.8 C) 97.8 F (36.6 C) 97.8 F (36.6 C)  TempSrc: Oral Oral    SpO2: 97% 100% 98% 100%  Weight:      Height:       General exam: awake & alert, no acute distress, frail, underweight HEENT: moist mucus membranes, hearing grossly normal  Respiratory system: on room air, normal respiratory effort. Cardiovascular system: RRR, no  peripheral edema Gastrointestinal system: soft, NT, ND Central nervous system: A&O x 3. no gross focal neurologic deficits, normal speech Extremities: severe arthritis deformities of both hands, no edema, normal tone Psychiatry: normal mood, congruent affect    Data Reviewed:  No new labs today. Notable labs from 7/9 -- Normal BMP except BUN 31 and Ca 8.8 CBC with WBC 3.4, hbg stable 11.6  Family Communication: None. No new medical updates to provide at this time.   Disposition: Status is: Inpatient Remains inpatient appropriate because: awaiting SNF placement.   Patient is medically ready to discharge.  Awaiting for LTC bed offer as per [facility]   Planned Discharge Destination: Skilled nursing facility    Time spent: 25 minutes  Author: Elvan Sor, MD 03/07/2024 4:52 PM  For on call review [email or web address].

## 2024-03-08 DIAGNOSIS — U071 COVID-19: Secondary | ICD-10-CM | POA: Diagnosis not present

## 2024-03-08 NOTE — Progress Notes (Signed)
 Progress Note   Patient: [name] FMW:[id number] DOB: 1941/08/08 DOA: 02/07/2024     29 DOS: the patient was seen and examined on 03/08/2024   Brief hospital course: [name] is a 83 y.o. female with medical history significant for Hypothyroidism, rheumatoid arthritis on chronic prednisone  and Renflexis  infusions, followed by rheumatology. They presented for rhabdomyolysis after she was found down by a neighbor > 24 hours when she fell and could not get up. ED course: normal vitals.  Labs showed normal CBC, Creatinine of 1.85 up from baseline of 1.1 to with bicarb of 19.  Mild LFT elevation with AST 43 and total bilirubin 2.7.  Troponin 17.  Urinalysis with many bacteria and small leuks.  EKG showed sinus arrhythmia at 88 CT head and C-spine without acute injury.  Chest x-ray nonacute showing multiple degenerative changes x-ray of the knee showing moderate right knee effusion among other findings-please see report. Patient treated with 2 L NS bolus and pain medicine.     Completed antibiotic for UTI.  IVF given and resolved rhabdo. Rehab was recommended by PT/OT but insurance company denied. TOC consulted for avenues for safe dispo as she lives alone and felt unsafe to be home alone. APS contacted and started case.    6/30 she was screened positive for COVID. She is currently asymptomatic and feels at baseline. She is ready for dc when safe dispo arranged.    I assumed care on 02/26/24. Pt is now off Covid isolation. Patient remains medically stable for discharge pending safe disposition plan. Patient is agreeable to go to SNF, was previously refusing.   Appears DSS pursuing guardianship.     Assessment and Plan:  * COVID-19 virus infection Supportive care.  Diagnosed on 6/30. Now off isolation precautions.  Difficulty voiding - reported 7/11.   UA borderline for questionable UTI with pyuria, rare bacteria. Urine culture insignificant growth Defer antibiotics    Multiple falls Lives by herself.  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  TOC following and DSS has been engaged.  See TOC notes. Iron profile and vitamin D  within normal range. SNF placement is pending.  Rhabdomyolysis CK peaked at 1195.  This problem has resolved.  AKI (acute kidney injury) (HCC) Creatinine improved from 1.85 down to 0.8   Acute cystitis without hematuria E. coli and Klebsiella growing out of urine culture.  Completed antibiotic course.  Rheumatoid arthritis involving multiple sites [facility]) On chronic immunosuppressive therapy Patient takes methotrexate  and folic acid .  Patient placed on prednisone  5 mg daily.  Acquired hypothyroidism Continue levothyroxine  at slightly increased dose with TSH being slightly elevated.  Constipation MiraLAX  ordered  Protein-calorie malnutrition, severe Continue supplements  Pressure injury of skin Present on admission.  Wounds restaged by wound care nurse.  They have progressed..  1.  Right buttock/ischium stage III.  2.  Left buttock/ischium now unstageable.  Cleanse bilateral buttock wounds with Vashe wound cleanser [name] [phone]), allowed to air dry.  Apply one quarter thick layer of Santyl  to wound beds daily using a Q-tip applicator to fill on the depth of the left buttock wound with normal saline gauze, top right buttock wound with normal saline gauze.  Top dry gauze and secure with silicone foam or ABD pad.  Memory loss Definitely concerned about her memory and living by herself.  Seen by psychiatry and deemed capable of making medical decisions currently.   TOC is engaging DSS -- see their  notes. Patient's orientation waxes and wanes, but I agree with psychiatry's assessment from 6/20 that pt has capacity for making decisions. 7/16 psych eval done, patient has capacity and she is able to make decisions about her treatments.      Subjective: No significant events  overnight.  Patient was laying in the bed comfortably without any complaints.   Physical Exam: Vitals:   03/07/24 1446 03/07/24 2008 03/08/24 0433 03/08/24 0812  BP: 107/67 (!) 116/59 125/60 110/60  Pulse: 89 87 69 75  Resp: 16  18 16   Temp: 97.8 F (36.6 C) 98.6 F (37 C) (!) 97.3 F (36.3 C) (!) 97.4 F (36.3 C)  TempSrc:      SpO2: 100% 96% 97% 98%  Weight:      Height:       General exam: awake & alert, no acute distress, frail, underweight HEENT: moist mucus membranes, hearing grossly normal  Respiratory system: on room air, normal respiratory effort. Cardiovascular system: RRR, no  peripheral edema Gastrointestinal system: soft, NT, ND Central nervous system: A&O x 3. no gross focal neurologic deficits, normal speech Extremities: severe arthritis deformities of both hands, no edema, normal tone Psychiatry: normal mood, congruent affect    Data Reviewed:  No new labs today. Notable labs from 7/9 -- Normal BMP except BUN 31 and Ca 8.8 CBC with WBC 3.4, hbg stable 11.6  Family Communication: None. No new medical updates to provide at this time.   Disposition: Status is: Inpatient Remains inpatient appropriate because: awaiting SNF placement.   Patient is medically ready to discharge.  Awaiting for LTC bed offer as per [facility]   Planned Discharge Destination: Skilled nursing facility    Time spent: 25 minutes  Author: Elvan Sor, MD 03/08/2024 4:29 PM  For on call review [email or web address].

## 2024-03-09 DIAGNOSIS — U071 COVID-19: Secondary | ICD-10-CM | POA: Diagnosis not present

## 2024-03-09 NOTE — Progress Notes (Signed)
 Mobility Specialist - Progress Note   03/09/24 1002  Mobility  Activity Ambulated with assistance in room;Stood at bedside  Level of Assistance Contact guard assist, steadying assist  Assistive Device Front wheel walker  Distance Ambulated (ft) 20 ft  Activity Response Tolerated well  Mobility visit 1 Mobility  Mobility Specialist Start Time (ACUTE ONLY) 0915  Mobility Specialist Stop Time (ACUTE ONLY) 0926  Mobility Specialist Time Calculation (min) (ACUTE ONLY) 11 min   Pt sitting in the recliner upon entry, utilizing RA. Pt agreeable to activity this date. Pt STS to RW CGA-MinG and amb towards the end of the bed and returned to the recliner. Once seated, Pt completed the following seated exercises indep with cuing for proper body mechanics: marches, leg extension, lateral toe taps and pillow squeezes. Pt STS to RW MinG, amb to the front door CGA-MinG--- tolerated well. Pt denied pain in BLE. Pt returned to the recliner, left seated with alarm set and needs within reach. NT notified.  [name] Mobility Specialist 03/09/24 10:07 AM

## 2024-03-09 NOTE — Progress Notes (Signed)
 Physical Therapy Treatment Patient Details Name: [name] MRN: [id number] DOB: July 09, 1941 Today's Date: 03/09/2024   History of Present Illness [name] is a 83 y.o. female with medical history significant for Hypothyroidism, rheumatoid arthritis on chronic prednisone  and Renflexis  infusions, followed by rheumatology, osteoarthritis, PCP documentation of several falls requiring ED visits, and who currently uses a walker, who is being admitted for rhabdomyolysis after she was found down by a neighbor. Found to be COVID+ on 6/30.    PT Comments  Pt seated in the recliner prior to session, extremely motivated and agreeable to therapy today. Due to reports of inability to use the restroom (constipation), PT educated pt on the importance of mobility for GI motility, pt verbalized understanding and was excited to proceed. PT focused today's session on increasing ambulation distance and activity tolerance. During session pt was able to perform a  STS from the recliner towards RW with CGA (for increased safety) and was able to ambulate 320 ft with RW and CGA (for increased safety). Pt continues to make significant progress from session to session, indicating improvement with PT services. Due to extended hospital stay, PT plan of care reviewed, with goals updated to reflect current status. Pt would benefit from skilled PT to continue to work towards PT goals and return to PLOF.     If plan is discharge home, recommend the following: Assistance with cooking/housework;A little help with bathing/dressing/bathroom;A little help with walking and/or transfers;Supervision due to cognitive status;Assist for transportation;Help with stairs or ramp for entrance   Can travel by private vehicle     Yes  Equipment Recommendations  Rolling walker (2 wheels)    Recommendations for Other Services       Precautions / Restrictions Precautions Precautions: Fall Recall of Precautions/Restrictions:  Impaired Restrictions Weight Bearing Restrictions Per Provider Order: No     Mobility  Bed Mobility               General bed mobility comments: Deferred pt in recliner upon entry    Transfers Overall transfer level: Needs assistance Equipment used: Rolling walker (2 wheels) Transfers: Sit to/from Stand Sit to Stand: Contact guard assist                Ambulation/Gait Ambulation/Gait assistance: Contact guard assist Gait Distance (Feet): 320 Feet Assistive device: Rolling walker (2 wheels) Gait Pattern/deviations: Narrow base of support, Decreased step length - right, Decreased step length - left, Decreased stride length, Trunk flexed Gait velocity: decreased     General Gait Details: Vc's on RW management and sequencing, pt pausing to readjust walker constantly during ambulation.        Balance Overall balance assessment: Needs assistance Sitting-balance support: No upper extremity supported, Feet supported Sitting balance-Leahy Scale: Good Sitting balance - Comments: Steady reaching within BOS     Standing balance-Leahy Scale: Fair Standing balance comment: Steady dynamic ambualtion with BUE reliant on RW                            Communication Communication Communication: No apparent difficulties  Cognition Arousal: Alert Behavior During Therapy: WFL for tasks assessed/performed   PT - Cognitive impairments: No family/caregiver present to determine baseline, No apparent impairments                         Following commands: Intact Following commands impaired: Follows one step commands with increased time  Cueing Cueing Techniques: Verbal cues, Gestural cues  Exercises      General Comments General comments (skin integrity, edema, etc.): Pre session HR: 90 bpm, With ambulation HR:112 bpm.      Pertinent Vitals/Pain Pain Assessment Pain Assessment: No/denies pain Pain Intervention(s): Limited activity within  patient's tolerance, Monitored during session     PT Goals (current goals can now be found in the care plan section) Acute Rehab PT Goals Patient Stated Goal: walk more PT Goal Formulation: With patient Time For Goal Achievement: 03/23/24 Potential to Achieve Goals: Good Progress towards PT goals: Progressing toward goals    Frequency    Min 2X/week       AM-PAC PT 6 Clicks Mobility   Outcome Measure  Help needed turning from your back to your side while in a flat bed without using bedrails?: A Little Help needed moving from lying on your back to sitting on the side of a flat bed without using bedrails?: A Little Help needed moving to and from a bed to a chair (including a wheelchair)?: A Little Help needed standing up from a chair using your arms (e.g., wheelchair or bedside chair)?: A Little Help needed to walk in hospital room?: A Little Help needed climbing 3-5 steps with a railing? : A Lot 6 Click Score: 17    End of Session Equipment Utilized During Treatment: Gait belt Activity Tolerance: Patient tolerated treatment well Patient left: in chair;with call bell/phone within reach;with chair alarm set, fall matt placed in front of recliner. Nurse Communication: Mobility status PT Visit Diagnosis: Muscle weakness (generalized) (M62.81);Difficulty in walking, not elsewhere classified (R26.2);Repeated falls (R29.6);Unsteadiness on feet (R26.81)     Time: 8398-8382 PT Time Calculation (min) (ACUTE ONLY): 16 min  Charges:                            [name], SPT 03/09/24, 4:33 PM

## 2024-03-09 NOTE — Progress Notes (Signed)
 Occupational Therapy Treatment Patient Details Name: [name] MRN: [id number] DOB: 07/29/1941 Today's Date: 03/09/2024   History of present illness [name] is a 83 y.o. female with medical history significant for Hypothyroidism, rheumatoid arthritis on chronic prednisone  and Renflexis  infusions, followed by rheumatology, osteoarthritis, PCP documentation of several falls requiring ED visits, and who currently uses a walker, who is being admitted for rhabdomyolysis after she was found down by a neighbor. Found to be COVID+ on 6/30.   OT comments  Pt seen for OT treatment on this date. Upon arrival to room pt seated in recliner chair, agreeable to tx. Pt requires CGA for sit to stand transfers to RW, CGA for safety  functional mobility to access bathroom, and CGA to transition onto toilet.  Patient required Min A with cuing for hand placement for sit to stand transfer to RW level from toilet with increased difficulty using grab bar due to limited grip strength and hand deformity.  Toileting hygiene in standing with CGA, perineal wash up at sink with CGA even with functional reaching with minimal weight shift to access. Cuing for safety with establishing BOS throughout task engagement in order to reduce risk of LOB and/or fall during ADL task engagement including hand hygiene.  Patient stood at countertop at [facility] level and completed functional reaching activities further outside of BOS while searching for desired items demonstrating slight improvement in carryover from earlier activities for balance retraining.  At end of session, patient seated in recliner chair, chair alarm activated, and call button/phone within reach.  Pt making good progress toward goals, will continue to follow POC. Discharge recommendation remains appropriate.        If plan is discharge home, recommend the following:      Equipment Recommendations       Recommendations for Other Services      Precautions /  Restrictions Precautions Precautions: Fall Recall of Precautions/Restrictions: Impaired Restrictions Weight Bearing Restrictions Per Provider Order: No       Mobility Bed Mobility                    Transfers Overall transfer level: Needs assistance Equipment used: Rolling walker (2 wheels) Transfers: Sit to/from Stand Sit to Stand: Min assist           General transfer comment: CGA from EOB and CGA to Min A from commode with use of grab bar     Balance Overall balance assessment: Needs assistance Sitting-balance support: No upper extremity supported, Feet supported Sitting balance-Leahy Scale: Good     Standing balance support: Single extremity supported Standing balance-Leahy Scale: Fair Standing balance comment: CGA for sit to stand transfers to toilet with LOB x1 noted requiring cuing and Min physical assistance to engage righting reactions.                           ADL either performed or assessed with clinical judgement   ADL Overall ADL's : Needs assistance/impaired             Lower Body Bathing: Minimal assistance;Cueing for sequencing;Cueing for safety;Sit to/from stand Lower Body Bathing Details (indicate cue type and reason): Min A for wash up standing at sink with cuing for thoroughness         Toilet Transfer: Rolling walker (2 wheels);Contact guard assist;Grab bars;Minimal assistance Toilet Transfer Details (indicate cue type and reason): CGA for sit to stand transfer with LOB x1 noted requiring cuing  and Min physical assistance to engage righting reactions. Toileting- Clothing Manipulation and Hygiene: Contact guard assist;Sit to/from stand;Cueing for safety         General ADL Comments: Cueing for safe transfer technique, falls prevention, and safe use of RW t/o session.    Extremity/Trunk Assessment Upper Extremity Assessment Upper Extremity Assessment: Generalized weakness RUE Deficits / Details: R hand deformities,  R>L LUE Deficits / Details: L hand deformities            Vision Baseline Vision/History: 1 Wears glasses     Perception     Praxis     Communication     Cognition Arousal: Alert Behavior During Therapy: WFL for tasks assessed/performed                                          Cueing      Exercises Other Exercises Other Exercises: Safety awareness training at RW level during ADL task engagement, education on righting reactions during standing components of ADLs to reduce risk of LOB or falls    Shoulder Instructions       General Comments      Pertinent Vitals/ Pain       Pain Assessment Pain Assessment: No/denies pain  Home Living                                          Prior Functioning/Environment              Frequency  Min 2X/week        Progress Toward Goals  OT Goals(current goals can now be found in the care plan section)  Progress towards OT goals: Progressing toward goals     Plan      Co-evaluation                 AM-PAC OT 6 Clicks Daily Activity     Outcome Measure   Help from another person eating meals?: A Little Help from another person taking care of personal grooming?: A Little Help from another person toileting, which includes using toliet, bedpan, or urinal?: A Little Help from another person bathing (including washing, rinsing, drying)?: A Little Help from another person to put on and taking off regular upper body clothing?: A Little Help from another person to put on and taking off regular lower body clothing?: A Little 6 Click Score: 18    End of Session Equipment Utilized During Treatment: Gait belt;Rolling walker (2 wheels)  OT Visit Diagnosis: Unsteadiness on feet (R26.81);Other abnormalities of gait and mobility (R26.89);Repeated falls (R29.6);Muscle weakness (generalized) (M62.81)   Activity Tolerance Patient tolerated treatment well   Patient Left in chair;with  call bell/phone within reach;with chair alarm set   Nurse Communication          Time: 8887-8869 OT Time Calculation (min): 18 min  Charges: OT General Charges $OT Visit: 1 Visit  Harlene Sharps OTR/L   Harlene LITTIE Sharps 03/09/2024, 11:43 AM

## 2024-03-09 NOTE — Plan of Care (Signed)
 Patient remains free from any noted signs of acute distress.  Has not required prn  medications to assist with symptom management.  Patient to continue to be monitored by hospital staff until discharged.

## 2024-03-09 NOTE — Progress Notes (Signed)
 Progress Note   Patient: [name] FMW:[id number] DOB: 1941-06-20 DOA: 02/07/2024     30 DOS: the patient was seen and examined on 03/09/2024   Brief hospital course:  [name] is a 83 y.o. female with medical history significant for Hypothyroidism, rheumatoid arthritis on chronic prednisone  and Renflexis  infusions, followed by rheumatology presented to the hospital after sustaining a fall on the floor found on the neighbor by neighbor for more than 24 hours.  In the ED patient had elevated creatinine of 1.8 from baseline 1.1 with mild LFT elevation and bilirubin at 2.7.  EKG showed sinus arrhythmia.  CT scan of the head and C-spine without any acute findings. Chest x-ray nonacute showing multiple degenerative changes x-ray of the knee showing moderate right knee effusion among other findings.  Patient was also noted to have abnormal urinalysis and was treated with antibiotics for UTI and rhabdomyolysis resolved with IV fluids.   Rehab was recommended by PT/OT but insurance company denied. TOC consulted for avenues for safe dispo as she lives alone and felt unsafe to be home alone. APS contacted and started case.  Patient has also tested for positive for COVID but is asymptomatic and at baseline.  Okay for discharge when safe dispo arranged.  DSS pursuing guardianship at this time.  Assessment and Plan:  * COVID-19 virus infection Asymptomatic.  Diagnosed on 6/30. Now off isolation precautions.  Denies any shortness of breath or dyspnea.  Multiple falls/debility, deconditioning. Patient had been living by herself at home.  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  Iron profile and vitamin D  within normal range.  At this time [facility] on board for skilled nursing facility placement.  Rhabdomyolysis Resolved at this time.  CK level peaked up to 1195  AKI (acute kidney injury) (HCC) Has resolved.  No recent BMP.  Acute E. coli and Klebsiella  cystitis without hematuria Has completed course of antibiotic.  Denies current symptoms.  Rheumatoid arthritis involving multiple sites  On chronic immunosuppressive therapy On methotrexate  and folic acid  at home..  Continue prednisone  while in the hospital.  Acquired hypothyroidism Synthroid  dose was slightly increased  Constipation Continue MiraLAX   Protein-calorie malnutrition, severe Body mass index is 14.02 kg/m.  Continue Ensure supplement  Pressure injury of skin, right buttock stage III and left buttocks unstageable.  Continue wound care.  Memory loss Definitely concerned about her memory and living by herself.  Seen by psychiatry on 03/04/2024 and deemed capable of making medical decisions currently.  TOC is engaging DSS.  Subjective:  Today, patient was seen and examined at bedside.  Patient denies any nausea vomiting fever chills or rigor.  Denies any shortness of breath or chest pain.  Denies urinary urgency frequency or dysuria.  Physical Exam: Vitals:   03/08/24 0812 03/08/24 1655 03/08/24 2103 03/09/24 0459  BP: 110/60 (!) 98/58 (!) 113/59 (!) 105/55  Pulse: 75 91 92 76  Resp: 16 16 18 20   Temp: (!) 97.4 F (36.3 C) 97.8 F (36.6 C) 98.5 F (36.9 C) 97.8 F (36.6 C)  TempSrc:      SpO2: 98% 97% 97% 97%  Weight:      Height:      Body mass index is 14.02 kg/m.  General: Thinly built, not in obvious distress, frail, HENT:   No scleral pallor or icterus noted. Oral mucosa is moist.  Chest:  Clear breath sounds.  Diminished breath sounds bilaterally. No crackles or  wheezes.  CVS: S1 &S2 heard. No murmur.  Regular rate and rhythm. Abdomen: Soft, nontender, nondistended.  Bowel sounds are heard.   Extremities: No cyanosis, clubbing or edema.  Peripheral pulses are palpable.  Thin extremities Psych: Alert, awake and oriented, normal mood CNS:  No cranial nerve deficits.  Power equal in all extremities.  Generalized weakness noted.  Thin extremities. Skin:  Warm and dry.  No rashes noted.  Data Reviewed:  No new labs today.  Family Communication: None.    Disposition: Status is: Inpatient Remains inpatient appropriate because: awaiting SNF placement.   Patient is medically ready to discharge.  Awaiting for LTC bed offer as per [facility]   Planned Discharge Destination: Skilled nursing facility     Time spent: 25 minutes  Author: Vernal Alstrom, MD 03/09/2024 7:10 AM  For on call review [email or web address].

## 2024-03-10 DIAGNOSIS — U071 COVID-19: Secondary | ICD-10-CM | POA: Diagnosis not present

## 2024-03-10 NOTE — Plan of Care (Signed)
 The patient remains on AR-1C as of time of writing. No telemetry; no supplemental O2 requirement at this time. No PIV access, or immediate need. Awaiting placement/dispo.    Problem: Education: Goal: Knowledge of General Education information will improve Description: Including pain rating scale, medication(s)/side effects and non-pharmacologic comfort measures Outcome: Progressing   Problem: Health Behavior/Discharge Planning: Goal: Ability to manage health-related needs will improve Outcome: Progressing   Problem: Clinical Measurements: Goal: Ability to maintain clinical measurements within normal limits will improve Outcome: Progressing Goal: Will remain free from infection Outcome: Progressing Goal: Diagnostic test results will improve Outcome: Progressing Goal: Respiratory complications will improve Outcome: Progressing Goal: Cardiovascular complication will be avoided Outcome: Progressing   Problem: Activity: Goal: Risk for activity intolerance will decrease Outcome: Progressing   Problem: Nutrition: Goal: Adequate nutrition will be maintained Outcome: Progressing   Problem: Coping: Goal: Level of anxiety will decrease Outcome: Progressing   Problem: Elimination: Goal: Will not experience complications related to bowel motility Outcome: Progressing Goal: Will not experience complications related to urinary retention Outcome: Progressing   Problem: Pain Managment: Goal: General experience of comfort will improve and/or be controlled Outcome: Progressing   Problem: Safety: Goal: Ability to remain free from injury will improve Outcome: Progressing   Problem: Skin Integrity: Goal: Risk for impaired skin integrity will decrease Outcome: Progressing

## 2024-03-10 NOTE — Progress Notes (Signed)
 Progress Note   Patient: [name] FMW:[id number] DOB: Apr 17, 1941 DOA: 02/07/2024     31 DOS: the patient was seen and examined on 03/10/2024   Brief hospital course:  [name] is a 83 y.o. female with medical history significant for Hypothyroidism, rheumatoid arthritis on chronic prednisone  and Renflexis  infusions, followed by rheumatology presented to the hospital after sustaining a fall on the floor found on the neighbor by neighbor for more than 24 hours.  In the ED, patient had elevated creatinine of 1.8 from baseline 1.1 with mild LFT elevation and bilirubin at 2.7.  EKG showed sinus arrhythmia.  CT scan of the head and C-spine without any acute findings. Chest x-ray nonacute showing multiple degenerative changes x-ray of the knee showing moderate right knee effusion among other findings.  Patient was also noted to have abnormal urinalysis and was treated with antibiotics for UTI and rhabdomyolysis resolved with IV fluids.   Rehab was recommended by PT/OT but insurance company denied. TOC consulted for avenues for safe dispo as she lives alone and felt unsafe to be home alone. APS contacted and started case.  Patient has also tested for positive for COVID but is asymptomatic and at baseline.  Okay for discharge when safe dispo arranged.  DSS pursuing guardianship at this time.  Assessment and Plan:  * COVID-19 virus infection Asymptomatic.  Diagnosed on 6/30. Now off isolation precautions.  Asymptomatic at this time.  Multiple falls/debility, deconditioning. Patient had been living by herself at home.  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  Iron profile and vitamin D  within normal range.  At this time TOC on board for placement needs  Rhabdomyolysis Resolved at this time.  CK level peaked up to 1195  AKI (acute kidney injury) (HCC) Has resolved.  No recent BMP.  Acute E. coli and Klebsiella cystitis without hematuria Has  completed course of antibiotic.  Denies current symptoms.  Rheumatoid arthritis involving multiple sites  On chronic immunosuppressive therapy On methotrexate  and folic acid  at home..  Continue prednisone  while in the hospital.  Acquired hypothyroidism Synthroid  dose was slightly increased during this admission  Constipation Continue MiraLAX  to ensure adequate bowel movements  Protein-calorie malnutrition, severe Body mass index is 14.02 kg/m.  Continue Ensure supplement  Pressure injury of skin, right buttock stage III and left buttocks unstageable.  Continue wound care.  Memory loss Definitely concerned about her memory and living by herself.  Seen by psychiatry on 03/04/2024 and deemed capable of making medical decisions.  TOC is engaging DSS.  Subjective:  Today, patient was seen and examined at bedside.  Patient feels okay.  Has had bowel movement yesterday.  Denies any nausea vomiting fever chills or rigor.    Physical Exam: Vitals:   03/09/24 1553 03/09/24 1953 03/10/24 0416 03/10/24 0927  BP: 119/67 (!) 108/57 (!) 119/54 (!) 104/54  Pulse: (!) 106 93 80 82  Resp: 17 16  18   Temp: 98 F (36.7 C) 98.2 F (36.8 C) 98.2 F (36.8 C) 98 F (36.7 C)  TempSrc:      SpO2: 100% 97% 95% 97%  Weight:      Height:      Body mass index is 14.02 kg/m.   General: Thinly built, not in obvious distress, frail appearing, Communicative, HENT:   No scleral pallor or icterus noted. Oral mucosa is moist.  Chest:  Clear breath sounds. . No crackles or wheezes.  CVS: S1 &  S2 heard. No murmur.  Regular rate and rhythm. Abdomen: Soft, nontender, nondistended.  Bowel sounds are heard.   Extremities: No cyanosis, clubbing or edema.  Peripheral pulses are palpable.  Thin extremities Psych: Alert, awake and Communicative.  Normal mood CNS:  No cranial nerve deficits.   Generalized weakness noted.  Thin extremities. Skin: Warm and dry.  No rashes noted.  Data Reviewed:  No new labs  today.  Family Communication: None.    Disposition: Status is: Inpatient Remains inpatient appropriate because: awaiting SNF placement.   Patient is medically ready to discharge.  Awaiting for LTC bed offer as per [facility]   Planned Discharge Destination: Skilled nursing facility/long-term care   Time spent: 25 minutes  Author: Vernal Alstrom, MD 03/10/2024 2:12 PM  For on call review [email or web address].

## 2024-03-10 NOTE — TOC Progression Note (Addendum)
 Transition of Care [facility]) - Progression Note    Patient Details  Name: [name] MRN: [id number] Date of Birth: [birth year]  [facility] CM/SW Contact  Dalia GORMAN Fuse, RN Phone Number: 03/10/2024, 4:12 PM  Clinical Narrative:     TOC reached out to [facility] and [facility] with [facility] and asked them to review to see if they can offer a LTC bed for this patient.   16:30: TOC received call back from [facility] at [facility], they can accept the patient tomorrow.  16:35: TOC visited the patient in the room and informed her of the bed offer. TOC also notifed the MD of the LTC bed.   Barriers to Discharge: Continued Medical Work up               Expected Discharge Plan and Services                                               Social Drivers of Health (SDOH) Interventions SDOH Screenings   Food Insecurity: Patient Unable To Answer (02/08/2024)  Housing: Unknown (02/08/2024)  Transportation Needs: Patient Unable To Answer (02/08/2024)  Utilities: Patient Unable To Answer (02/08/2024)  Financial Resource Strain: Low Risk  (01/25/2023)   Received from [facility]  Social Connections: Patient Unable To Answer (02/08/2024)  Tobacco Use: Low Risk  (02/07/2024)    Readmission Risk Interventions     No data to display

## 2024-03-10 NOTE — Plan of Care (Signed)
 Patient remains free from any noted signs of acute distress.  Has verbally denied any acute pain/discomfort.  Has not required any additional pain/prn medication to assist with symptom management.  Patient to continue to be monitored by hospital staff until discharge.

## 2024-03-11 DIAGNOSIS — U071 COVID-19: Secondary | ICD-10-CM | POA: Diagnosis not present

## 2024-03-11 MED ORDER — ASCORBIC ACID 500 MG PO TABS: 500.0000 mg | ORAL_TABLET | Freq: Two times a day (BID) | ORAL | Status: AC

## 2024-03-11 MED ORDER — ADULT MULTIVITAMIN W/MINERALS CH: 1.0000 | ORAL_TABLET | Freq: Every day | ORAL | Status: AC

## 2024-03-11 MED ORDER — ZINC OXIDE 40 % EX OINT: TOPICAL_OINTMENT | Freq: Two times a day (BID) | CUTANEOUS | Status: AC

## 2024-03-11 MED ORDER — HYDROCODONE-ACETAMINOPHEN 5-325 MG PO TABS: 1.0000 | ORAL_TABLET | Freq: Four times a day (QID) | ORAL | 0 refills | Status: AC | PRN

## 2024-03-11 MED ORDER — ENSURE PLUS HIGH PROTEIN PO LIQD: 237.0000 mL | Freq: Two times a day (BID) | ORAL | Status: AC

## 2024-03-11 MED ORDER — SENNOSIDES-DOCUSATE SODIUM 8.6-50 MG PO TABS: 2.0000 | ORAL_TABLET | Freq: Every day | ORAL | Status: AC

## 2024-03-11 MED ORDER — HYDROCODONE-ACETAMINOPHEN 5-325 MG PO TABS: 1.0000 | ORAL_TABLET | Freq: Four times a day (QID) | ORAL | 0 refills | Status: DC | PRN

## 2024-03-11 MED ORDER — ONDANSETRON HCL 4 MG PO TABS: 4.0000 mg | ORAL_TABLET | Freq: Four times a day (QID) | ORAL | Status: AC | PRN

## 2024-03-11 MED ORDER — COLLAGENASE 250 UNIT/GM EX OINT: TOPICAL_OINTMENT | Freq: Every day | CUTANEOUS | Status: AC

## 2024-03-11 NOTE — Discharge Summary (Addendum)
 Physician Discharge Summary   Patient: [name] MRN: [id number]  DOB: 1940/12/01   Admit:     Date of Admission: 02/07/2024 Admitted from: home   Discharge: Date of discharge: 03/11/24 Disposition: Skilled nursing facility Condition at discharge: good  CODE STATUS: FULL CODE     Discharge Physician: Laneta Blunt, DO [facility]     PCP: Glover Lenis, MD  Recommendations for Outpatient Follow-up:  Follow up with PCP Glover Lenis, MD in 1-2 weeks    Discharge Instructions     Diet - low sodium heart healthy   Complete by: As directed    Discharge wound care:   Complete by: As directed    Cleanse B buttocks wounds with Vashe wound cleanser [name] [phone]) do not rinse and allow to air dry.  Apply 1/4 thick layer of Santyl  to wound beds daily, using a Q tip applicator fill in depth of L buttock wound with NS gauze, top R buttock wound with NS gauze.  Secure with dry gauze and secure with silicone foam or ABD pad whichever is preferred   Increase activity slowly   Complete by: As directed          Discharge Diagnoses: Principal Problem:   COVID-19 virus infection Active Problems:   Multiple falls   Rhabdomyolysis   AKI (acute kidney injury) (HCC)   On chronic immunosuppressive therapy   Acute cystitis without hematuria   Acquired hypothyroidism   Rheumatoid arthritis involving multiple sites (HCC)   Memory loss   Pressure injury of skin   Protein-calorie malnutrition, severe   Constipation       Hospital course / significant events:   HPI: [name] is a 83 y.o. female with medical history significant for Hypothyroidism, rheumatoid arthritis on chronic prednisone  and Renflexis  infusions, followed by rheumatology presented to the hospital after sustaining a fall on the floor found on the neighbor by neighbor for more than 24 hours.   06/20: admitted to hospitalist, UTI and rhabdo 06/30: (+)COVID  Rehab was  recommended by PT/OT but insurance company denied. TOC consulted for avenues for safe dispo as she lives alone and felt unsafe to be home alone.  APS contacted and started case. DSS pursuing guardianship at this time.      Consultants:  Psychiatry podiatry  Procedures/Surgeries: none      ASSESSMENT & PLAN:   COVID-19 virus infection Asymptomatic.   Diagnosed on 6/30. Now off isolation precautions.     Multiple falls/debility, deconditioning. Patient had been living by herself at home.  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  Iron profile and vitamin D  within normal range.  At this time TOC on board for placement needs   Rhabdomyolysis Resolved at this time.  CK level peaked up to 1195   AKI (acute kidney injury)  Has resolved.    Acute E. coli and Klebsiella cystitis without hematuria Has completed course of antibiotic.  Denies current symptoms.   Rheumatoid arthritis involving multiple sites  On chronic immunosuppressive therapy Continue prednisone . Restart  methotrexate  per outpatient team    Acquired hypothyroidism Synthroid  dose was slightly increased during this admission   Constipation Continue MiraLAX  to ensure adequate bowel movements   Protein-calorie malnutrition, severe Body mass index is 14.02 kg/m.  Continue Ensure supplement   Pressure injury of skin, right buttock stage III and left buttocks unstageable.  Continue wound care.   Memory loss Definitely concerned about  her memory and living by herself.  Seen by psychiatry on 03/04/2024 and deemed capable of making medical decisions.  TOC is engaging DSS.      Underweight based on BMI: Body mass index is 15.3 kg/m.SABRA Significantly low or high BMI is associated with higher medical risk.  Underweight - under 18  overweight - 25 to 29 obese - 30 or more Class 1 obesity: BMI of 30.0 to 34 Class 2 obesity: BMI of 35.0 to 39 Class 3 obesity: BMI  of 40.0 to 49 Super Morbid Obesity: BMI 50-59 Super-super Morbid Obesity: BMI 60+ Healthy nutrition and physical activity advised as adjunct to other disease management and risk reduction treatments       Discharge Instructions  Allergies as of 03/11/2024   No Known Allergies      Medication List     STOP taking these medications    methotrexate  2.5 MG tablet Commonly known as: RHEUMATREX       TAKE these medications    acetaminophen  500 MG tablet Commonly known as: TYLENOL  Take 500 mg by mouth 2 (two) times daily.   alendronate 70 MG tablet Commonly known as: FOSAMAX Take 70 mg by mouth once a week.   ascorbic acid  500 MG tablet Commonly known as: VITAMIN C  Take 1 tablet (500 mg total) by mouth 2 (two) times daily.   aspirin  EC 81 MG tablet Take 81 mg by mouth daily.   cholecalciferol 1000 units tablet Commonly known as: VITAMIN D  Take 1,000 Units by mouth daily.   collagenase  250 UNIT/GM ointment Commonly known as: SANTYL  Apply topically daily. Apply to buttock/ischial wounds; Apply 1/4 thick layer of Santyl  to wound bed, fill in wounds with NS gauze Start taking on: March 12, 2024   cyanocobalamin  1000 MCG tablet Commonly known as: VITAMIN B12 Take 1,000 mcg by mouth daily.   feeding supplement Liqd Take 237 mLs by mouth 2 (two) times daily between meals.   ferrous sulfate  324 (65 Fe) MG Tbec Take 1 tablet by mouth daily.   folic acid  1 MG tablet Commonly known as: FOLVITE  Take 1 mg by mouth daily.   HYDROcodone -acetaminophen  5-325 MG tablet Commonly known as: NORCO/VICODIN Take 1-2 tablets by mouth every 6 (six) hours as needed for moderate pain (pain score 4-6) or severe pain (pain score 7-10).   levothyroxine  75 MCG tablet Commonly known as: SYNTHROID  TAKE 1 TABLET(75 MCG) BY MOUTH EVERY DAY 30 TO 60 MINUTES BEFORE BREAKFAST ON AN EMPTY STOMACH AND WITH A GLASS OF WATER   liver oil-zinc  oxide 40 % ointment Commonly known as:  DESITIN Apply topically 2 (two) times daily. Apply a thin layer of desitin to intact skin of buttocks/sacrum 2 times daily and prn soiling   multivitamin with minerals Tabs tablet Take 1 tablet by mouth daily. Start taking on: March 12, 2024   ondansetron  4 MG tablet Commonly known as: ZOFRAN  Take 1 tablet (4 mg total) by mouth every 6 (six) hours as needed for nausea.   predniSONE  5 MG tablet Commonly known as: DELTASONE  Take 5 mg by mouth daily with breakfast.   senna-docusate 8.6-50 MG tablet Commonly known as: Senokot-S Take 2 tablets by mouth at bedtime.               Discharge Care Instructions  (From admission, onward)           Start     Ordered   03/11/24 0000  Discharge wound care:       Comments:  Cleanse B buttocks wounds with Vashe wound cleanser [name] [phone]) do not rinse and allow to air dry.  Apply 1/4 thick layer of Santyl  to wound beds daily, using a Q tip applicator fill in depth of L buttock wound with NS gauze, top R buttock wound with NS gauze.  Secure with dry gauze and secure with silicone foam or ABD pad whichever is preferred   03/11/24 1258             Contact information for follow-up providers     Glover Lenis, MD Follow up.   Specialty: Family Medicine Why: Hospital follow up Contact information: [address] [place] [address] [phone]              Contact information for after-discharge care     Destination     [facility] .   Service: Skilled Nursing Contact information: [address] [place]  [phone]                     No Known Allergies   Subjective: pt feeling well today, no cocnerns or complaints. Denies CP/SOB, N/V, HA/VC. Tolerating diet    Discharge Exam: BP 109/62 (BP Location: Left Arm)   Pulse 80   Temp (!) 97.3 F (36.3 C) (Oral)   Resp 14   Ht 5' 6 (1.676 m)   Wt 43 kg   SpO2 96%   BMI 15.30 kg/m  General: Pt  is alert, awake, not in acute distress Cardiovascular: RRR, S1/S2 +, no rubs, no gallops Respiratory: CTA bilaterally, no wheezing, no rhonchi Abdominal: Soft, NT, ND, bowel sounds + Extremities: no edema, no cyanosis     The results of significant diagnostics from this hospitalization (including imaging, microbiology, ancillary and laboratory) are listed below for reference.     Microbiology: No results found for this or any previous visit (from the past 240 hours).   Labs: BNP (last 3 results) No results for input(s): BNP in the last 8760 hours. Basic Metabolic Panel: No results for input(s): NA, K, CL, CO2, GLUCOSE, BUN, CREATININE, CALCIUM, MG, PHOS in the last 168 hours. Liver Function Tests: No results for input(s): AST, ALT, ALKPHOS, BILITOT, PROT, ALBUMIN in the last 168 hours. No results for input(s): LIPASE, AMYLASE in the last 168 hours. No results for input(s): AMMONIA in the last 168 hours. CBC: No results for input(s): WBC, NEUTROABS, HGB, HCT, MCV, PLT in the last 168 hours. Cardiac Enzymes: No results for input(s): CKTOTAL, CKMB, CKMBINDEX, TROPONINI in the last 168 hours. BNP: Invalid input(s): POCBNP CBG: No results for input(s): GLUCAP in the last 168 hours. D-Dimer No results for input(s): DDIMER in the last 72 hours. Hgb A1c No results for input(s): HGBA1C in the last 72 hours. Lipid Profile No results for input(s): CHOL, HDL, LDLCALC, TRIG, CHOLHDL, LDLDIRECT in the last 72 hours. Thyroid  function studies No results for input(s): TSH, T4TOTAL, T3FREE, THYROIDAB in the last 72 hours.  Invalid input(s): FREET3 Anemia work up No results for input(s): VITAMINB12, FOLATE, FERRITIN, TIBC, IRON, RETICCTPCT in the last 72 hours. Urinalysis    Component Value Date/Time   COLORURINE YELLOW (A) 02/28/2024 1510   APPEARANCEUR CLOUDY (A) 02/28/2024 1510    APPEARANCEUR Clear 06/17/2013 0851   LABSPEC 1.020 02/28/2024 1510   LABSPEC 1.005 06/17/2013 0851   PHURINE 5.0 02/28/2024 1510   GLUCOSEU NEGATIVE 02/28/2024 1510   GLUCOSEU Negative 06/17/2013 0851   HGBUR NEGATIVE 02/28/2024 1510  BILIRUBINUR NEGATIVE 02/28/2024 1510   BILIRUBINUR Negative 06/17/2013 0851   KETONESUR NEGATIVE 02/28/2024 1510   PROTEINUR NEGATIVE 02/28/2024 1510   NITRITE NEGATIVE 02/28/2024 1510   LEUKOCYTESUR TRACE (A) 02/28/2024 1510   LEUKOCYTESUR Negative 06/17/2013 0851   Sepsis Labs No results for input(s): WBC in the last 168 hours.  Invalid input(s): PROCALCITONIN, LACTICIDVEN Microbiology No results found for this or any previous visit (from the past 240 hours). Imaging DG Knee Complete 4 Views Right Result Date: 02/07/2024 CLINICAL DATA:  Fall, patient found down by neighbor, knee pain EXAM: RIGHT KNEE - COMPLETE 4+ VIEW COMPARISON:  None Available. FINDINGS: Moderate knee effusion.  Tricompartmental spurring. Possible small osteochondral lesion or fragment along the tibial spine. Second linear calcification along the tibial spine is present, cannot exclude avulsion. One of the oblique projections suggests a scalloped appearance of the lateral tibial plateau making it difficult to completely exclude the possibility of a Schatzker III pure depression lateral tibial plateau fracture. Other images are less suggestive. Consider CT of the knee for further characterization. IMPRESSION: 1. Moderate knee effusion. 2. Possible small osteochondral lesion or fragment along the tibial spine. Second linear calcification along the tibial spine is present, cannot exclude avulsion. 3. One of the oblique projections suggests a scalloped appearance of the lateral tibial plateau making it difficult to completely exclude the possibility of a Schatzker III pure depression lateral tibial plateau fracture. Other images are less suggestive. Consider CT of the knee for further  characterization. 4. Tricompartmental spurring. Electronically Signed   By: Ryan Salvage M.D.   On: 02/07/2024 20:10   DG Chest Port 1 View Result Date: 02/07/2024 CLINICAL DATA:  Fall, patient found down.  Right knee pain. EXAM: PORTABLE CHEST 1 VIEW COMPARISON:  04/06/2021 FINDINGS: Advanced degenerative glenohumeral arthropathy bilaterally with patchy sclerosis and severe loss of articular space. Stable mild widening of the left AC joint. Reduced chromium humeral distance on the right probably from a chronic rotator cuff tear on the right. Levoconvex lower thoracic scoliosis. Lower thoracic compression fracture at the T11 level appears increased in magnitude but is otherwise age indeterminate. Stable borderline elevation of the right hemidiaphragm. The lungs appear clear. Heart size within normal limits for projection. No significant blunting of the costophrenic angles. IMPRESSION: 1. Lower thoracic compression fracture at the T11 level appears increased in magnitude compared to 04/06/2021 but is otherwise age indeterminate. 2. Advanced degenerative glenohumeral arthropathy bilaterally. 3. Stable mild widening of the left AC joint. 4. Probable chronic right rotator cuff tear. Electronically Signed   By: Ryan Salvage M.D.   On: 02/07/2024 20:06   CT Head Wo Contrast Result Date: 02/07/2024 CLINICAL DATA:  Head trauma, neck trauma, found by neighbor after fall in home. EXAM: CT HEAD WITHOUT CONTRAST CT CERVICAL SPINE WITHOUT CONTRAST TECHNIQUE: Multidetector CT imaging of the head and cervical spine was performed following the standard protocol without intravenous contrast. Multiplanar CT image reconstructions of the cervical spine were also generated. RADIATION DOSE REDUCTION: This exam was performed according to the departmental dose-optimization program which includes automated exposure control, adjustment of the mA and/or kV according to patient size and/or use of iterative reconstruction  technique. COMPARISON:  None Available. FINDINGS: CT HEAD FINDINGS Brain: No acute intracranial hemorrhage. No CT evidence of acute infarct. No edema, mass effect, or midline shift. The basilar cisterns are patent. Ventricles: Prominence of the ventricles suggesting underlying parenchymal volume loss. Vascular: No hyperdense vessel or unexpected calcification. Skull: No acute or aggressive finding. Orbits: Orbits  are symmetric. Sinuses: Mucosal thickening and possible mucous retention cyst in the left sphenoid sinus. Other: Mastoid air cells are clear. CT CERVICAL SPINE FINDINGS Alignment: Straightening of the normal cervical lordosis. No significant listhesis. No facet subluxation or dislocation. Skull base and vertebrae: No acute fracture. No primary bone lesion or focal pathologic process. There is likely partial fusion of the C4 and C5 vertebral bodies. Soft tissues and spinal canal: No prevertebral fluid or swelling. No visible canal hematoma. Subcentimeter nodule in the left thyroid , no follow-up required. Disc levels: Severe intervertebral disc space narrowing at multiple levels. No high-grade osseous spinal canal stenosis. Facet arthrosis and uncovertebral hypertrophy at multiple levels. Foraminal narrowing most pronounced on the right at C5-6. Upper chest: No acute finding. Other: None. IMPRESSION: No CT evidence of acute intracranial abnormality. No acute fracture or traumatic malalignment of the cervical spine. Chronic and degenerative changes as above. Electronically Signed   By: Donnice Mania M.D.   On: 02/07/2024 19:58   CT Cervical Spine Wo Contrast Result Date: 02/07/2024 CLINICAL DATA:  Head trauma, neck trauma, found by neighbor after fall in home. EXAM: CT HEAD WITHOUT CONTRAST CT CERVICAL SPINE WITHOUT CONTRAST TECHNIQUE: Multidetector CT imaging of the head and cervical spine was performed following the standard protocol without intravenous contrast. Multiplanar CT image reconstructions of  the cervical spine were also generated. RADIATION DOSE REDUCTION: This exam was performed according to the departmental dose-optimization program which includes automated exposure control, adjustment of the mA and/or kV according to patient size and/or use of iterative reconstruction technique. COMPARISON:  None Available. FINDINGS: CT HEAD FINDINGS Brain: No acute intracranial hemorrhage. No CT evidence of acute infarct. No edema, mass effect, or midline shift. The basilar cisterns are patent. Ventricles: Prominence of the ventricles suggesting underlying parenchymal volume loss. Vascular: No hyperdense vessel or unexpected calcification. Skull: No acute or aggressive finding. Orbits: Orbits are symmetric. Sinuses: Mucosal thickening and possible mucous retention cyst in the left sphenoid sinus. Other: Mastoid air cells are clear. CT CERVICAL SPINE FINDINGS Alignment: Straightening of the normal cervical lordosis. No significant listhesis. No facet subluxation or dislocation. Skull base and vertebrae: No acute fracture. No primary bone lesion or focal pathologic process. There is likely partial fusion of the C4 and C5 vertebral bodies. Soft tissues and spinal canal: No prevertebral fluid or swelling. No visible canal hematoma. Subcentimeter nodule in the left thyroid , no follow-up required. Disc levels: Severe intervertebral disc space narrowing at multiple levels. No high-grade osseous spinal canal stenosis. Facet arthrosis and uncovertebral hypertrophy at multiple levels. Foraminal narrowing most pronounced on the right at C5-6. Upper chest: No acute finding. Other: None. IMPRESSION: No CT evidence of acute intracranial abnormality. No acute fracture or traumatic malalignment of the cervical spine. Chronic and degenerative changes as above. Electronically Signed   By: Donnice Mania M.D.   On: 02/07/2024 19:58      Time coordinating discharge: over 30 minutes  SIGNED:  Brayleigh Rybacki DO [facility]

## 2024-03-11 NOTE — Progress Notes (Signed)
 Mobility Specialist - Progress Note   Pre-mobility: HR, BP, SpO2 During mobility: HR, BP, SpO2 Post-mobility: HR, BP, SPO2     03/11/24 1128  Mobility  Activity Ambulated with assistance in hallway  Level of Assistance Contact guard assist, steadying assist  Assistive Device Front wheel walker  Distance Ambulated (ft) 200 ft  Range of Motion/Exercises Active  Activity Response Tolerated well  Mobility Referral Yes  Mobility visit 1 Mobility  Mobility Specialist Start Time (ACUTE ONLY) 1058  Mobility Specialist Stop Time (ACUTE ONLY) 1117  Mobility Specialist Time Calculation (min) (ACUTE ONLY) 19 min   Pt resting in bed upon entry on RA. Pt STS and ambulates to hallway around NS CGA with RW. Pt gait is slow but moderately steady throughout session. Pt returned to recliner and left with needs in reach. Bed alarm activated.   Guido Rumble Mobility Specialist 03/12/24, 11:23 AM

## 2024-03-11 NOTE — TOC Progression Note (Signed)
 Transition of Care [facility]) - Progression Note    Patient Details  Name: [name] MRN: [id number] Date of Birth: 12/09/1940  [facility] CM/SW Contact  Dalia GORMAN Fuse, RN Phone Number: 03/11/2024, 1:21 PM  Clinical Narrative:     The patient is medically clear to dc to [facility]. [facility] will transport.    Barriers to Discharge: Barriers Resolved               Expected Discharge Plan and Services         Expected Discharge Date: 03/11/24                                     Social Drivers of Health (SDOH) Interventions SDOH Screenings   Food Insecurity: Patient Unable To Answer (02/08/2024)  Housing: Unknown (02/08/2024)  Transportation Needs: Patient Unable To Answer (02/08/2024)  Utilities: Patient Unable To Answer (02/08/2024)  Financial Resource Strain: Low Risk  (01/25/2023)   Received from [facility]  Social Connections: Patient Unable To Answer (02/08/2024)  Tobacco Use: Low Risk  (02/07/2024)    Readmission Risk Interventions     No data to display
